# Patient Record
Sex: Female | Born: 1960
Health system: Southern US, Community
[De-identification: ages and names within clinical notes are randomized; demographics above are authoritative.]

## PROBLEM LIST (undated history)

## (undated) DIAGNOSIS — F419 Anxiety disorder, unspecified: Secondary | ICD-10-CM

## (undated) DIAGNOSIS — K3189 Other diseases of stomach and duodenum: Secondary | ICD-10-CM

## (undated) DIAGNOSIS — F329 Major depressive disorder, single episode, unspecified: Secondary | ICD-10-CM

## (undated) DIAGNOSIS — T7840XA Allergy, unspecified, initial encounter: Secondary | ICD-10-CM

## (undated) DIAGNOSIS — R001 Bradycardia, unspecified: Secondary | ICD-10-CM

## (undated) DIAGNOSIS — H9190 Unspecified hearing loss, unspecified ear: Secondary | ICD-10-CM

## (undated) DIAGNOSIS — E785 Hyperlipidemia, unspecified: Secondary | ICD-10-CM

## (undated) DIAGNOSIS — Z860100 Personal history of colon polyps, unspecified: Secondary | ICD-10-CM

## (undated) DIAGNOSIS — A0472 Enterocolitis due to Clostridium difficile, not specified as recurrent: Secondary | ICD-10-CM

## (undated) DIAGNOSIS — F32A Depression, unspecified: Secondary | ICD-10-CM

## (undated) DIAGNOSIS — N61 Mastitis without abscess: Secondary | ICD-10-CM

## (undated) DIAGNOSIS — D689 Coagulation defect, unspecified: Secondary | ICD-10-CM

## (undated) DIAGNOSIS — E538 Deficiency of other specified B group vitamins: Secondary | ICD-10-CM

## (undated) DIAGNOSIS — H269 Unspecified cataract: Secondary | ICD-10-CM

## (undated) DIAGNOSIS — D6800 Von Willebrand disease, unspecified: Secondary | ICD-10-CM

## (undated) DIAGNOSIS — M810 Age-related osteoporosis without current pathological fracture: Secondary | ICD-10-CM

## (undated) DIAGNOSIS — I471 Supraventricular tachycardia, unspecified: Secondary | ICD-10-CM

## (undated) DIAGNOSIS — Z86011 Personal history of benign neoplasm of the brain: Secondary | ICD-10-CM

## (undated) DIAGNOSIS — K648 Other hemorrhoids: Secondary | ICD-10-CM

## (undated) DIAGNOSIS — D691 Qualitative platelet defects: Secondary | ICD-10-CM

## (undated) DIAGNOSIS — K5140 Inflammatory polyps of colon without complications: Secondary | ICD-10-CM

## (undated) DIAGNOSIS — M7710 Lateral epicondylitis, unspecified elbow: Secondary | ICD-10-CM

## (undated) DIAGNOSIS — N189 Chronic kidney disease, unspecified: Secondary | ICD-10-CM

## (undated) DIAGNOSIS — R3915 Urgency of urination: Secondary | ICD-10-CM

## (undated) DIAGNOSIS — E876 Hypokalemia: Secondary | ICD-10-CM

## (undated) DIAGNOSIS — J302 Other seasonal allergic rhinitis: Secondary | ICD-10-CM

## (undated) DIAGNOSIS — M8580 Other specified disorders of bone density and structure, unspecified site: Secondary | ICD-10-CM

## (undated) DIAGNOSIS — D649 Anemia, unspecified: Secondary | ICD-10-CM

## (undated) DIAGNOSIS — I10 Essential (primary) hypertension: Secondary | ICD-10-CM

## (undated) DIAGNOSIS — J189 Pneumonia, unspecified organism: Secondary | ICD-10-CM

## (undated) HISTORY — DX: Von Willebrand disease, unspecified: D68.00

## (undated) HISTORY — PX: WISDOM TOOTH EXTRACTION: SHX21

## (undated) HISTORY — PX: IMPLANTATION BONE ANCHORED HEARING AID: SUR691

## (undated) HISTORY — DX: Lateral epicondylitis, unspecified elbow: M77.10

## (undated) HISTORY — DX: Unspecified cataract: H26.9

## (undated) HISTORY — DX: Personal history of benign neoplasm of the brain: Z86.011

## (undated) HISTORY — DX: Coagulation defect, unspecified: D68.9

## (undated) HISTORY — DX: Anemia, unspecified: D64.9

## (undated) HISTORY — DX: Enterocolitis due to Clostridium difficile, not specified as recurrent: A04.72

## (undated) HISTORY — DX: Von Willebrand disease: D68.0

## (undated) HISTORY — DX: Other diseases of stomach and duodenum: K31.89

## (undated) HISTORY — DX: Allergy, unspecified, initial encounter: T78.40XA

## (undated) HISTORY — DX: Other hemorrhoids: K64.8

## (undated) HISTORY — DX: Inflammatory polyps of colon without complications: K51.40

## (undated) HISTORY — DX: Other specified disorders of bone density and structure, unspecified site: M85.80

## (undated) HISTORY — DX: Supraventricular tachycardia: I47.1

## (undated) HISTORY — PX: ESOPHAGOGASTRODUODENOSCOPY: SHX1529

## (undated) HISTORY — DX: Deficiency of other specified B group vitamins: E53.8

## (undated) HISTORY — DX: Bradycardia, unspecified: R00.1

## (undated) HISTORY — DX: Supraventricular tachycardia, unspecified: I47.10

## (undated) HISTORY — DX: Hyperlipidemia, unspecified: E78.5

## (undated) HISTORY — DX: Essential (primary) hypertension: I10

## (undated) HISTORY — DX: Qualitative platelet defects: D69.1

## (undated) HISTORY — DX: Anxiety disorder, unspecified: F41.9

## (undated) HISTORY — DX: Chronic kidney disease, unspecified: N18.9

## (undated) HISTORY — DX: Age-related osteoporosis without current pathological fracture: M81.0

---

## 1998-06-16 ENCOUNTER — Other Ambulatory Visit: Admission: RE | Admit: 1998-06-16 | Discharge: 1998-06-16 | Payer: Self-pay | Admitting: Obstetrics and Gynecology

## 1998-08-18 ENCOUNTER — Inpatient Hospital Stay (HOSPITAL_COMMUNITY): Admission: AD | Admit: 1998-08-18 | Discharge: 1998-08-19 | Payer: Self-pay | Admitting: Geriatric Medicine

## 1998-08-18 ENCOUNTER — Encounter: Payer: Self-pay | Admitting: Geriatric Medicine

## 1998-08-19 ENCOUNTER — Encounter: Payer: Self-pay | Admitting: Internal Medicine

## 1998-08-19 ENCOUNTER — Encounter: Payer: Self-pay | Admitting: Geriatric Medicine

## 1998-09-03 ENCOUNTER — Ambulatory Visit (HOSPITAL_COMMUNITY): Admission: RE | Admit: 1998-09-03 | Discharge: 1998-09-03 | Payer: Self-pay | Admitting: Internal Medicine

## 1999-01-16 ENCOUNTER — Encounter: Payer: Self-pay | Admitting: Emergency Medicine

## 1999-01-16 ENCOUNTER — Emergency Department (HOSPITAL_COMMUNITY): Admission: EM | Admit: 1999-01-16 | Discharge: 1999-01-16 | Payer: Self-pay | Admitting: Emergency Medicine

## 1999-01-16 ENCOUNTER — Encounter: Payer: Self-pay | Admitting: Internal Medicine

## 2000-01-20 ENCOUNTER — Other Ambulatory Visit: Admission: RE | Admit: 2000-01-20 | Discharge: 2000-01-20 | Payer: Self-pay | Admitting: Obstetrics and Gynecology

## 2000-05-18 ENCOUNTER — Encounter: Payer: Self-pay | Admitting: Obstetrics and Gynecology

## 2000-05-18 ENCOUNTER — Encounter: Admission: RE | Admit: 2000-05-18 | Discharge: 2000-05-18 | Payer: Self-pay | Admitting: Obstetrics and Gynecology

## 2001-05-29 ENCOUNTER — Other Ambulatory Visit: Admission: RE | Admit: 2001-05-29 | Discharge: 2001-05-29 | Payer: Self-pay | Admitting: Obstetrics and Gynecology

## 2001-05-31 ENCOUNTER — Encounter: Admission: RE | Admit: 2001-05-31 | Discharge: 2001-05-31 | Payer: Self-pay | Admitting: Obstetrics and Gynecology

## 2001-05-31 ENCOUNTER — Encounter: Payer: Self-pay | Admitting: Obstetrics and Gynecology

## 2001-07-24 ENCOUNTER — Encounter: Payer: Self-pay | Admitting: Internal Medicine

## 2002-05-26 ENCOUNTER — Encounter: Payer: Self-pay | Admitting: Internal Medicine

## 2002-05-28 ENCOUNTER — Encounter: Payer: Self-pay | Admitting: Gastroenterology

## 2002-05-28 ENCOUNTER — Encounter: Admission: RE | Admit: 2002-05-28 | Discharge: 2002-05-28 | Payer: Self-pay | Admitting: Gastroenterology

## 2002-07-10 ENCOUNTER — Other Ambulatory Visit: Admission: RE | Admit: 2002-07-10 | Discharge: 2002-07-10 | Payer: Self-pay | Admitting: Obstetrics and Gynecology

## 2002-07-18 ENCOUNTER — Encounter: Admission: RE | Admit: 2002-07-18 | Discharge: 2002-07-18 | Payer: Self-pay | Admitting: Internal Medicine

## 2002-07-18 ENCOUNTER — Encounter: Payer: Self-pay | Admitting: Internal Medicine

## 2002-08-29 ENCOUNTER — Encounter (INDEPENDENT_AMBULATORY_CARE_PROVIDER_SITE_OTHER): Payer: Self-pay | Admitting: Specialist

## 2002-08-29 ENCOUNTER — Encounter: Payer: Self-pay | Admitting: Internal Medicine

## 2002-08-29 ENCOUNTER — Ambulatory Visit (HOSPITAL_COMMUNITY): Admission: RE | Admit: 2002-08-29 | Discharge: 2002-08-29 | Payer: Self-pay | Admitting: Gastroenterology

## 2003-07-24 ENCOUNTER — Encounter: Admission: RE | Admit: 2003-07-24 | Discharge: 2003-07-24 | Payer: Self-pay | Admitting: Internal Medicine

## 2003-09-11 ENCOUNTER — Other Ambulatory Visit: Admission: RE | Admit: 2003-09-11 | Discharge: 2003-09-11 | Payer: Self-pay | Admitting: Obstetrics and Gynecology

## 2004-07-27 ENCOUNTER — Encounter: Admission: RE | Admit: 2004-07-27 | Discharge: 2004-07-27 | Payer: Self-pay | Admitting: Internal Medicine

## 2004-11-16 ENCOUNTER — Other Ambulatory Visit: Admission: RE | Admit: 2004-11-16 | Discharge: 2004-11-16 | Payer: Self-pay | Admitting: Obstetrics and Gynecology

## 2005-04-18 ENCOUNTER — Emergency Department (HOSPITAL_COMMUNITY): Admission: EM | Admit: 2005-04-18 | Discharge: 2005-04-18 | Payer: Self-pay | Admitting: Emergency Medicine

## 2005-04-18 ENCOUNTER — Encounter: Payer: Self-pay | Admitting: Internal Medicine

## 2005-05-05 ENCOUNTER — Emergency Department (HOSPITAL_COMMUNITY): Admission: EM | Admit: 2005-05-05 | Discharge: 2005-05-05 | Payer: Self-pay | Admitting: Emergency Medicine

## 2006-08-03 ENCOUNTER — Encounter: Admission: RE | Admit: 2006-08-03 | Discharge: 2006-08-03 | Payer: Self-pay | Admitting: Internal Medicine

## 2006-08-22 ENCOUNTER — Other Ambulatory Visit: Admission: RE | Admit: 2006-08-22 | Discharge: 2006-08-22 | Payer: Self-pay | Admitting: Obstetrics and Gynecology

## 2007-05-10 ENCOUNTER — Encounter: Admission: RE | Admit: 2007-05-10 | Discharge: 2007-05-10 | Payer: Self-pay | Admitting: Neurological Surgery

## 2007-05-29 HISTORY — PX: BRAIN MENINGIOMA EXCISION: SHX576

## 2007-06-05 ENCOUNTER — Ambulatory Visit: Payer: Self-pay | Admitting: Hematology and Oncology

## 2007-06-07 ENCOUNTER — Encounter: Payer: Self-pay | Admitting: Internal Medicine

## 2007-06-07 LAB — CBC WITH DIFFERENTIAL/PLATELET
BASO%: 0.8 % (ref 0.0–2.0)
Basophils Absolute: 0.1 10*3/uL (ref 0.0–0.1)
EOS%: 4.9 % (ref 0.0–7.0)
Eosinophils Absolute: 0.4 10*3/uL (ref 0.0–0.5)
HCT: 40 % (ref 34.8–46.6)
HGB: 14.3 g/dL (ref 11.6–15.9)
LYMPH%: 24.2 % (ref 14.0–48.0)
MCH: 30.7 pg (ref 26.0–34.0)
MCHC: 35.7 g/dL (ref 32.0–36.0)
MCV: 86.1 fL (ref 81.0–101.0)
MONO#: 0.3 10*3/uL (ref 0.1–0.9)
MONO%: 4.7 % (ref 0.0–13.0)
NEUT#: 4.9 10*3/uL (ref 1.5–6.5)
NEUT%: 65.4 % (ref 39.6–76.8)
Platelets: 228 10*3/uL (ref 145–400)
RBC: 4.65 10*6/uL (ref 3.70–5.32)
RDW: 13.2 % (ref 11.3–14.5)
WBC: 7.4 10*3/uL (ref 3.9–10.0)
lymph#: 1.8 10*3/uL (ref 0.9–3.3)

## 2007-06-07 LAB — IVY BLEEDING TIME: Bleeding Time: 11.5 Minutes — ABNORMAL HIGH (ref 2.0–8.0)

## 2007-06-07 LAB — PROTHROMBIN TIME
INR: 1 (ref 0.0–1.5)
Prothrombin Time: 13.1 seconds (ref 11.6–15.2)

## 2007-06-07 LAB — APTT: aPTT: 28 seconds (ref 24–37)

## 2007-06-13 LAB — COMPREHENSIVE METABOLIC PANEL
ALT: 15 U/L (ref 0–35)
AST: 15 U/L (ref 0–37)
Albumin: 4.2 g/dL (ref 3.5–5.2)
Alkaline Phosphatase: 52 U/L (ref 39–117)
BUN: 14 mg/dL (ref 6–23)
CO2: 23 mEq/L (ref 19–32)
Calcium: 9 mg/dL (ref 8.4–10.5)
Chloride: 109 mEq/L (ref 96–112)
Creatinine, Ser: 0.75 mg/dL (ref 0.40–1.20)
Glucose, Bld: 101 mg/dL — ABNORMAL HIGH (ref 70–99)
Potassium: 3.8 mEq/L (ref 3.5–5.3)
Sodium: 141 mEq/L (ref 135–145)
Total Bilirubin: 0.3 mg/dL (ref 0.3–1.2)
Total Protein: 7.3 g/dL (ref 6.0–8.3)

## 2007-06-13 LAB — FIBRINOGEN: Fibrinogen: 346 mg/dL (ref 204–475)

## 2007-06-13 LAB — VON WILLEBRAND FACTOR MULTIMER
Factor-VIII Activity: 69 % — ABNORMAL LOW (ref 75–150)
Ristocetin-Cofactor: 82 % (ref 50–150)
Von Willebrand Ag: 118 % normal (ref 61–164)
Von Willebrand Multimers: NORMAL

## 2007-06-13 LAB — THROMBIN TIME: Thrombin Time: 15.5 s (ref 14.7–19.5)

## 2007-06-17 LAB — IVY BLEEDING TIME: Bleeding Time: 12 Minutes — ABNORMAL HIGH (ref 2.0–8.0)

## 2007-06-21 ENCOUNTER — Encounter: Payer: Self-pay | Admitting: Internal Medicine

## 2007-06-21 LAB — PLATELET AGGREGATION STUDY, BLOOD
ADP10: NORMAL
ADP5: NORMAL
Arachidonic Acid: NORMAL
COMMENT (PLATELET AGGREGATE): NORMAL
Collagen Aggregation: NORMAL
Epinephrine 10: NORMAL
Ristocetin: NORMAL
Thrombin Receptor: NORMAL

## 2007-06-24 LAB — VON WILLEBRAND PANEL
Factor-VIII Activity: 76 % (ref 75–150)
Factor-VIII Activity: 171 % — ABNORMAL HIGH (ref 75–150)
Ristocetin-Cofactor: 58 % (ref 50–150)
Ristocetin-Cofactor: >150 % — ABNORMAL HIGH (ref 50–150)
Von Willebrand Ag: 106 % normal (ref 61–164)
Von Willebrand Ag: 179 % normal — ABNORMAL HIGH (ref 61–164)

## 2007-06-27 ENCOUNTER — Encounter (INDEPENDENT_AMBULATORY_CARE_PROVIDER_SITE_OTHER): Payer: Self-pay | Admitting: Neurological Surgery

## 2007-06-27 ENCOUNTER — Ambulatory Visit: Payer: Self-pay | Admitting: Hematology and Oncology

## 2007-06-27 ENCOUNTER — Inpatient Hospital Stay (HOSPITAL_COMMUNITY): Admission: RE | Admit: 2007-06-27 | Discharge: 2007-07-01 | Payer: Self-pay | Admitting: Neurological Surgery

## 2007-06-27 DIAGNOSIS — H90A21 Sensorineural hearing loss, unilateral, right ear, with restricted hearing on the contralateral side: Secondary | ICD-10-CM | POA: Insufficient documentation

## 2007-08-15 ENCOUNTER — Encounter: Admission: RE | Admit: 2007-08-15 | Discharge: 2007-08-15 | Payer: Self-pay | Admitting: *Deleted

## 2007-10-18 ENCOUNTER — Encounter: Payer: Self-pay | Admitting: Internal Medicine

## 2008-01-22 ENCOUNTER — Encounter: Payer: Self-pay | Admitting: Internal Medicine

## 2008-02-26 ENCOUNTER — Ambulatory Visit: Payer: Self-pay | Admitting: Internal Medicine

## 2008-02-26 DIAGNOSIS — K5190 Ulcerative colitis, unspecified, without complications: Secondary | ICD-10-CM | POA: Insufficient documentation

## 2008-02-26 DIAGNOSIS — F411 Generalized anxiety disorder: Secondary | ICD-10-CM | POA: Insufficient documentation

## 2008-02-26 DIAGNOSIS — R05 Cough: Secondary | ICD-10-CM

## 2008-02-26 DIAGNOSIS — I10 Essential (primary) hypertension: Secondary | ICD-10-CM | POA: Insufficient documentation

## 2008-02-26 DIAGNOSIS — R059 Cough, unspecified: Secondary | ICD-10-CM | POA: Insufficient documentation

## 2008-02-26 DIAGNOSIS — K219 Gastro-esophageal reflux disease without esophagitis: Secondary | ICD-10-CM | POA: Insufficient documentation

## 2008-02-26 DIAGNOSIS — J342 Deviated nasal septum: Secondary | ICD-10-CM | POA: Insufficient documentation

## 2008-02-26 DIAGNOSIS — I498 Other specified cardiac arrhythmias: Secondary | ICD-10-CM | POA: Insufficient documentation

## 2008-02-26 DIAGNOSIS — R498 Other voice and resonance disorders: Secondary | ICD-10-CM | POA: Insufficient documentation

## 2008-02-27 ENCOUNTER — Ambulatory Visit (HOSPITAL_COMMUNITY): Admission: RE | Admit: 2008-02-27 | Discharge: 2008-02-27 | Payer: Self-pay | Admitting: Otolaryngology

## 2008-02-27 DIAGNOSIS — Z9621 Cochlear implant status: Secondary | ICD-10-CM | POA: Insufficient documentation

## 2008-04-21 ENCOUNTER — Encounter: Payer: Self-pay | Admitting: Internal Medicine

## 2008-04-24 ENCOUNTER — Other Ambulatory Visit: Admission: RE | Admit: 2008-04-24 | Discharge: 2008-04-24 | Payer: Self-pay | Admitting: Obstetrics and Gynecology

## 2008-05-06 ENCOUNTER — Ambulatory Visit (HOSPITAL_COMMUNITY): Admission: RE | Admit: 2008-05-06 | Discharge: 2008-05-06 | Payer: Self-pay | Admitting: Internal Medicine

## 2008-05-06 ENCOUNTER — Ambulatory Visit: Payer: Self-pay | Admitting: Internal Medicine

## 2008-05-06 ENCOUNTER — Encounter: Payer: Self-pay | Admitting: Internal Medicine

## 2008-05-10 ENCOUNTER — Encounter: Payer: Self-pay | Admitting: Internal Medicine

## 2008-05-11 ENCOUNTER — Ambulatory Visit: Payer: Self-pay | Admitting: Hematology and Oncology

## 2008-05-11 LAB — BASIC METABOLIC PANEL
BUN: 14 mg/dL (ref 6–23)
CO2: 26 mEq/L (ref 19–32)
Calcium: 9.7 mg/dL (ref 8.4–10.5)
Chloride: 101 mEq/L (ref 96–112)
Creatinine, Ser: 0.64 mg/dL (ref 0.40–1.20)
Glucose, Bld: 85 mg/dL (ref 70–99)
Potassium: 3.5 mEq/L (ref 3.5–5.3)
Sodium: 139 mEq/L (ref 135–145)

## 2008-05-22 ENCOUNTER — Ambulatory Visit (HOSPITAL_COMMUNITY): Admission: RE | Admit: 2008-05-22 | Discharge: 2008-05-22 | Payer: Self-pay | Admitting: Internal Medicine

## 2008-05-23 ENCOUNTER — Encounter: Payer: Self-pay | Admitting: Internal Medicine

## 2008-05-26 ENCOUNTER — Encounter: Payer: Self-pay | Admitting: Internal Medicine

## 2008-05-29 ENCOUNTER — Encounter: Payer: Self-pay | Admitting: Internal Medicine

## 2008-06-11 ENCOUNTER — Encounter: Payer: Self-pay | Admitting: Internal Medicine

## 2008-06-24 ENCOUNTER — Ambulatory Visit: Payer: Self-pay | Admitting: Gastroenterology

## 2008-06-24 ENCOUNTER — Telehealth: Payer: Self-pay | Admitting: Internal Medicine

## 2008-06-24 ENCOUNTER — Encounter: Payer: Self-pay | Admitting: Gastroenterology

## 2008-06-26 ENCOUNTER — Telehealth: Payer: Self-pay | Admitting: Gastroenterology

## 2008-07-10 ENCOUNTER — Ambulatory Visit: Payer: Self-pay | Admitting: Internal Medicine

## 2008-07-30 ENCOUNTER — Encounter: Payer: Self-pay | Admitting: Internal Medicine

## 2008-09-21 ENCOUNTER — Telehealth: Payer: Self-pay | Admitting: Internal Medicine

## 2008-09-29 ENCOUNTER — Encounter: Payer: Self-pay | Admitting: Internal Medicine

## 2008-10-15 ENCOUNTER — Telehealth: Payer: Self-pay | Admitting: Internal Medicine

## 2008-10-22 ENCOUNTER — Ambulatory Visit: Payer: Self-pay | Admitting: Internal Medicine

## 2008-10-23 ENCOUNTER — Ambulatory Visit: Payer: Self-pay | Admitting: Internal Medicine

## 2008-10-23 LAB — CONVERTED CEMR LAB
ALT: 17 units/L (ref 0–35)
AST: 17 units/L (ref 0–37)
Albumin: 4 g/dL (ref 3.5–5.2)
Alkaline Phosphatase: 65 units/L (ref 39–117)
BUN: 12 mg/dL (ref 6–23)
Basophils Absolute: 0 10*3/uL (ref 0.0–0.1)
Basophils Relative: 0.5 % (ref 0.0–3.0)
CO2: 31 meq/L (ref 19–32)
Calcium: 9.4 mg/dL (ref 8.4–10.5)
Chloride: 100 meq/L (ref 96–112)
Creatinine, Ser: 0.7 mg/dL (ref 0.4–1.2)
Eosinophils Absolute: 0 10*3/uL (ref 0.0–0.7)
Eosinophils Relative: 0 % (ref 0.0–5.0)
GFR calc Af Amer: 115 mL/min
GFR calc non Af Amer: 95 mL/min
Glucose, Bld: 126 mg/dL — ABNORMAL HIGH (ref 70–99)
HCT: 42.5 % (ref 36.0–46.0)
Hemoglobin: 15 g/dL (ref 12.0–15.0)
Hgb A1c MFr Bld: 5.4 % (ref 4.6–6.0)
Iron: 56 ug/dL (ref 42–145)
Lymphocytes Relative: 6.5 % — ABNORMAL LOW (ref 12.0–46.0)
MCHC: 35.2 g/dL (ref 30.0–36.0)
MCV: 87.6 fL (ref 78.0–100.0)
Monocytes Absolute: 0.1 10*3/uL (ref 0.1–1.0)
Monocytes Relative: 0.7 % — ABNORMAL LOW (ref 3.0–12.0)
Neutro Abs: 8.7 10*3/uL — ABNORMAL HIGH (ref 1.4–7.7)
Neutrophils Relative %: 92.3 % — ABNORMAL HIGH (ref 43.0–77.0)
Platelets: 255 10*3/uL (ref 150–400)
Potassium: 3.6 meq/L (ref 3.5–5.1)
RBC: 4.86 M/uL (ref 3.87–5.11)
RDW: 12.8 % (ref 11.5–14.6)
Saturation Ratios: 18.1 % — ABNORMAL LOW (ref 20.0–50.0)
Sed Rate: 12 mm/hr (ref 0–22)
Sodium: 140 meq/L (ref 135–145)
Total Bilirubin: 0.6 mg/dL (ref 0.3–1.2)
Total Protein: 7.2 g/dL (ref 6.0–8.3)
Transferrin: 221.3 mg/dL (ref 212.0–360.0)
WBC: 9.4 10*3/uL (ref 4.5–10.5)

## 2008-10-28 ENCOUNTER — Telehealth: Payer: Self-pay | Admitting: Internal Medicine

## 2008-12-18 ENCOUNTER — Ambulatory Visit: Payer: Self-pay | Admitting: Internal Medicine

## 2009-03-04 ENCOUNTER — Ambulatory Visit: Payer: Self-pay | Admitting: Internal Medicine

## 2009-03-04 LAB — CONVERTED CEMR LAB
Basophils Absolute: 0 10*3/uL (ref 0.0–0.1)
Basophils Relative: 0.5 % (ref 0.0–3.0)
Eosinophils Absolute: 0 10*3/uL (ref 0.0–0.7)
Eosinophils Relative: 0.4 % (ref 0.0–5.0)
HCT: 42 % (ref 36.0–46.0)
Hemoglobin: 14.6 g/dL (ref 12.0–15.0)
Lymphocytes Relative: 31.9 % (ref 12.0–46.0)
Lymphs Abs: 1.8 10*3/uL (ref 0.7–4.0)
MCHC: 34.7 g/dL (ref 30.0–36.0)
MCV: 87.6 fL (ref 78.0–100.0)
Monocytes Absolute: 0.5 10*3/uL (ref 0.1–1.0)
Monocytes Relative: 8.2 % (ref 3.0–12.0)
Neutro Abs: 3.2 10*3/uL (ref 1.4–7.7)
Neutrophils Relative %: 59 % (ref 43.0–77.0)
Platelets: 194 10*3/uL (ref 150.0–400.0)
RBC: 4.79 M/uL (ref 3.87–5.11)
RDW: 13.2 % (ref 11.5–14.6)
WBC: 5.5 10*3/uL (ref 4.5–10.5)

## 2009-04-30 ENCOUNTER — Telehealth: Payer: Self-pay | Admitting: Internal Medicine

## 2009-05-18 ENCOUNTER — Ambulatory Visit: Payer: Self-pay | Admitting: Gastroenterology

## 2009-05-18 DIAGNOSIS — K644 Residual hemorrhoidal skin tags: Secondary | ICD-10-CM | POA: Insufficient documentation

## 2009-05-19 ENCOUNTER — Encounter: Payer: Self-pay | Admitting: Nurse Practitioner

## 2009-05-19 LAB — CONVERTED CEMR LAB
ALT: 24 units/L (ref 0–35)
AST: 26 units/L (ref 0–37)
Albumin: 4 g/dL (ref 3.5–5.2)
Alkaline Phosphatase: 74 units/L (ref 39–117)
BUN: 11 mg/dL (ref 6–23)
Basophils Absolute: 0 10*3/uL (ref 0.0–0.1)
Basophils Relative: 0.7 % (ref 0.0–3.0)
CO2: 32 meq/L (ref 19–32)
CRP, High Sensitivity: 4.2 (ref 0.00–5.00)
Calcium: 9.2 mg/dL (ref 8.4–10.5)
Chloride: 102 meq/L (ref 96–112)
Creatinine, Ser: 0.7 mg/dL (ref 0.4–1.2)
Eosinophils Absolute: 0.1 10*3/uL (ref 0.0–0.7)
Eosinophils Relative: 1.6 % (ref 0.0–5.0)
GFR calc non Af Amer: 94.84 mL/min (ref >60)
Glucose, Bld: 89 mg/dL (ref 70–99)
HCT: 41 % (ref 36.0–46.0)
Hemoglobin: 13.9 g/dL (ref 12.0–15.0)
Lymphocytes Relative: 32.1 % (ref 12.0–46.0)
Lymphs Abs: 1.7 10*3/uL (ref 0.7–4.0)
MCHC: 34 g/dL (ref 30.0–36.0)
MCV: 86.9 fL (ref 78.0–100.0)
Monocytes Absolute: 0.5 10*3/uL (ref 0.1–1.0)
Monocytes Relative: 9.6 % (ref 3.0–12.0)
Neutro Abs: 2.9 10*3/uL (ref 1.4–7.7)
Neutrophils Relative %: 56 % (ref 43.0–77.0)
Platelets: 210 10*3/uL (ref 150.0–400.0)
Potassium: 3.5 meq/L (ref 3.5–5.1)
RBC: 4.72 M/uL (ref 3.87–5.11)
RDW: 13.3 % (ref 11.5–14.6)
Sed Rate: 13 mm/hr (ref 0–22)
Sodium: 140 meq/L (ref 135–145)
Total Bilirubin: 0.8 mg/dL (ref 0.3–1.2)
Total Protein: 7.4 g/dL (ref 6.0–8.3)
WBC: 5.2 10*3/uL (ref 4.5–10.5)

## 2009-05-21 ENCOUNTER — Encounter: Payer: Self-pay | Admitting: Nurse Practitioner

## 2009-06-02 ENCOUNTER — Ambulatory Visit: Payer: Self-pay | Admitting: Internal Medicine

## 2009-06-19 ENCOUNTER — Emergency Department (HOSPITAL_COMMUNITY): Admission: EM | Admit: 2009-06-19 | Discharge: 2009-06-19 | Payer: Self-pay | Admitting: Emergency Medicine

## 2009-06-19 ENCOUNTER — Encounter: Payer: Self-pay | Admitting: Internal Medicine

## 2009-07-08 ENCOUNTER — Telehealth: Payer: Self-pay | Admitting: Internal Medicine

## 2009-07-09 ENCOUNTER — Emergency Department (HOSPITAL_COMMUNITY): Admission: EM | Admit: 2009-07-09 | Discharge: 2009-07-09 | Payer: Self-pay | Admitting: Emergency Medicine

## 2009-07-09 ENCOUNTER — Encounter: Payer: Self-pay | Admitting: Internal Medicine

## 2009-07-19 ENCOUNTER — Ambulatory Visit: Payer: Self-pay | Admitting: Internal Medicine

## 2009-07-19 ENCOUNTER — Encounter: Payer: Self-pay | Admitting: Internal Medicine

## 2009-07-19 LAB — CONVERTED CEMR LAB
ALT: 13 units/L (ref 0–35)
AST: 21 units/L (ref 0–37)
Albumin: 3.6 g/dL (ref 3.5–5.2)
Alkaline Phosphatase: 43 units/L (ref 39–117)
BUN: 8 mg/dL (ref 6–23)
Basophils Absolute: 0 10*3/uL (ref 0.0–0.1)
Basophils Relative: 0.2 % (ref 0.0–3.0)
CO2: 31 meq/L (ref 19–32)
Calcium: 9 mg/dL (ref 8.4–10.5)
Chloride: 104 meq/L (ref 96–112)
Creatinine, Ser: 0.8 mg/dL (ref 0.4–1.2)
Eosinophils Absolute: 0 10*3/uL (ref 0.0–0.7)
Eosinophils Relative: 0.1 % (ref 0.0–5.0)
GFR calc non Af Amer: 81.23 mL/min (ref >60)
Glucose, Bld: 147 mg/dL — ABNORMAL HIGH (ref 70–99)
HCT: 39.8 % (ref 36.0–46.0)
Hemoglobin: 13.4 g/dL (ref 12.0–15.0)
Lymphocytes Relative: 9.8 % — ABNORMAL LOW (ref 12.0–46.0)
Lymphs Abs: 0.8 10*3/uL (ref 0.7–4.0)
MCHC: 33.7 g/dL (ref 30.0–36.0)
MCV: 90.8 fL (ref 78.0–100.0)
Monocytes Absolute: 0.4 10*3/uL (ref 0.1–1.0)
Monocytes Relative: 4.8 % (ref 3.0–12.0)
Neutro Abs: 7.2 10*3/uL (ref 1.4–7.7)
Neutrophils Relative %: 85.1 % — ABNORMAL HIGH (ref 43.0–77.0)
Platelets: 245 10*3/uL (ref 150.0–400.0)
Potassium: 3.8 meq/L (ref 3.5–5.1)
RBC: 4.38 M/uL (ref 3.87–5.11)
RDW: 13.1 % (ref 11.5–14.6)
Sed Rate: 9 mm/hr (ref 0–22)
Sodium: 142 meq/L (ref 135–145)
Total Bilirubin: 0.8 mg/dL (ref 0.3–1.2)
Total Protein: 6.4 g/dL (ref 6.0–8.3)
WBC: 8.4 10*3/uL (ref 4.5–10.5)

## 2009-07-23 ENCOUNTER — Telehealth: Payer: Self-pay | Admitting: Internal Medicine

## 2009-07-26 ENCOUNTER — Ambulatory Visit: Payer: Self-pay | Admitting: Internal Medicine

## 2009-07-26 ENCOUNTER — Encounter: Payer: Self-pay | Admitting: Internal Medicine

## 2009-07-26 LAB — CONVERTED CEMR LAB
Glucose, Bld: 100 mg/dL — ABNORMAL HIGH (ref 70–99)
Potassium: 3.6 meq/L (ref 3.5–5.1)

## 2009-07-29 ENCOUNTER — Encounter: Payer: Self-pay | Admitting: Internal Medicine

## 2009-07-30 ENCOUNTER — Ambulatory Visit: Payer: Self-pay | Admitting: Internal Medicine

## 2009-07-30 LAB — CONVERTED CEMR LAB
CO2: 31 meq/L (ref 19–32)
Chloride: 102 meq/L (ref 96–112)
Potassium: 4.2 meq/L (ref 3.5–5.1)
Sodium: 140 meq/L (ref 135–145)

## 2009-08-09 ENCOUNTER — Encounter: Payer: Self-pay | Admitting: Internal Medicine

## 2009-08-13 ENCOUNTER — Ambulatory Visit: Payer: Self-pay | Admitting: Internal Medicine

## 2009-08-16 LAB — CONVERTED CEMR LAB
CO2: 30 meq/L (ref 19–32)
Chloride: 99 meq/L (ref 96–112)
Potassium: 4.2 meq/L (ref 3.5–5.1)
Sodium: 136 meq/L (ref 135–145)

## 2009-09-03 ENCOUNTER — Ambulatory Visit: Payer: Self-pay | Admitting: Internal Medicine

## 2009-09-06 LAB — CONVERTED CEMR LAB
Albumin: 3.3 g/dL — ABNORMAL LOW (ref 3.5–5.2)
BUN: 14 mg/dL (ref 6–23)
CO2: 31 meq/L (ref 19–32)
Calcium: 9 mg/dL (ref 8.4–10.5)
Chloride: 102 meq/L (ref 96–112)
Creatinine, Ser: 1 mg/dL (ref 0.4–1.2)
GFR calc non Af Amer: 62.76 mL/min (ref >60)
Glucose, Bld: 118 mg/dL — ABNORMAL HIGH (ref 70–99)
Hgb A1c MFr Bld: 5.7 % (ref 4.6–6.5)
Phosphorus: 3.8 mg/dL (ref 2.3–4.6)
Potassium: 4 meq/L (ref 3.5–5.1)
Sodium: 142 meq/L (ref 135–145)

## 2009-09-17 ENCOUNTER — Ambulatory Visit: Payer: Self-pay | Admitting: Internal Medicine

## 2009-09-17 DIAGNOSIS — F329 Major depressive disorder, single episode, unspecified: Secondary | ICD-10-CM | POA: Insufficient documentation

## 2009-09-19 ENCOUNTER — Emergency Department (HOSPITAL_COMMUNITY): Admission: EM | Admit: 2009-09-19 | Discharge: 2009-09-19 | Payer: Self-pay | Admitting: Emergency Medicine

## 2009-09-19 ENCOUNTER — Encounter (INDEPENDENT_AMBULATORY_CARE_PROVIDER_SITE_OTHER): Payer: Self-pay | Admitting: *Deleted

## 2009-09-23 ENCOUNTER — Telehealth: Payer: Self-pay | Admitting: Internal Medicine

## 2009-09-24 ENCOUNTER — Telehealth: Payer: Self-pay | Admitting: Internal Medicine

## 2009-09-28 ENCOUNTER — Telehealth: Payer: Self-pay | Admitting: Internal Medicine

## 2009-10-01 ENCOUNTER — Telehealth: Payer: Self-pay | Admitting: Internal Medicine

## 2009-10-01 ENCOUNTER — Encounter: Payer: Self-pay | Admitting: Internal Medicine

## 2009-10-08 ENCOUNTER — Ambulatory Visit: Payer: Self-pay | Admitting: Internal Medicine

## 2009-10-08 LAB — CONVERTED CEMR LAB
Basophils Absolute: 0.3 10*3/uL — ABNORMAL HIGH (ref 0.0–0.1)
Basophils Relative: 3.3 % — ABNORMAL HIGH (ref 0.0–3.0)
Eosinophils Absolute: 0 10*3/uL (ref 0.0–0.7)
Eosinophils Relative: 0 % (ref 0.0–5.0)
HCT: 41.6 % (ref 36.0–46.0)
Hemoglobin: 13.3 g/dL (ref 12.0–15.0)
Lymphocytes Relative: 6 % — ABNORMAL LOW (ref 12.0–46.0)
Lymphs Abs: 0.6 10*3/uL — ABNORMAL LOW (ref 0.7–4.0)
MCHC: 32 g/dL (ref 30.0–36.0)
MCV: 86.9 fL (ref 78.0–100.0)
Monocytes Absolute: 0.1 10*3/uL (ref 0.1–1.0)
Monocytes Relative: 0.7 % — ABNORMAL LOW (ref 3.0–12.0)
Neutro Abs: 9.2 10*3/uL — ABNORMAL HIGH (ref 1.4–7.7)
Neutrophils Relative %: 90 % — ABNORMAL HIGH (ref 43.0–77.0)
Platelets: 260 10*3/uL (ref 150.0–400.0)
Potassium: 4 meq/L (ref 3.5–5.1)
RBC: 4.79 M/uL (ref 3.87–5.11)
RDW: 13.6 % (ref 11.5–14.6)
WBC: 10.2 10*3/uL (ref 4.5–10.5)

## 2009-10-13 ENCOUNTER — Telehealth: Payer: Self-pay | Admitting: Internal Medicine

## 2009-10-15 ENCOUNTER — Encounter: Payer: Self-pay | Admitting: Internal Medicine

## 2009-10-15 ENCOUNTER — Ambulatory Visit: Payer: Self-pay | Admitting: Internal Medicine

## 2009-10-15 DIAGNOSIS — I491 Atrial premature depolarization: Secondary | ICD-10-CM | POA: Insufficient documentation

## 2009-10-15 DIAGNOSIS — I495 Sick sinus syndrome: Secondary | ICD-10-CM | POA: Insufficient documentation

## 2009-10-15 LAB — CONVERTED CEMR LAB
BUN: 9 mg/dL (ref 6–23)
Basophils Absolute: 0.1 10*3/uL (ref 0.0–0.1)
Basophils Relative: 0.7 % (ref 0.0–3.0)
CO2: 30 meq/L (ref 19–32)
Calcium: 9.5 mg/dL (ref 8.4–10.5)
Chloride: 103 meq/L (ref 96–112)
Creatinine, Ser: 0.8 mg/dL (ref 0.4–1.2)
Eosinophils Absolute: 0 10*3/uL (ref 0.0–0.7)
Eosinophils Relative: 0.5 % (ref 0.0–5.0)
GFR calc non Af Amer: 81.15 mL/min (ref >60)
Glucose, Bld: 112 mg/dL — ABNORMAL HIGH (ref 70–99)
HCT: 41 % (ref 36.0–46.0)
Hemoglobin: 13.4 g/dL (ref 12.0–15.0)
INR: 1 (ref 0.8–1.0)
Lymphocytes Relative: 8.8 % — ABNORMAL LOW (ref 12.0–46.0)
Lymphs Abs: 0.8 10*3/uL (ref 0.7–4.0)
MCHC: 32.6 g/dL (ref 30.0–36.0)
MCV: 86.6 fL (ref 78.0–100.0)
Monocytes Absolute: 0.2 10*3/uL (ref 0.1–1.0)
Monocytes Relative: 2 % — ABNORMAL LOW (ref 3.0–12.0)
Neutro Abs: 7.9 10*3/uL — ABNORMAL HIGH (ref 1.4–7.7)
Neutrophils Relative %: 88 % — ABNORMAL HIGH (ref 43.0–77.0)
Platelets: 238 10*3/uL (ref 150.0–400.0)
Potassium: 3.9 meq/L (ref 3.5–5.1)
Prothrombin Time: 10.2 s (ref 9.1–11.7)
RBC: 4.74 M/uL (ref 3.87–5.11)
RDW: 14 % (ref 11.5–14.6)
Sodium: 140 meq/L (ref 135–145)
WBC: 9 10*3/uL (ref 4.5–10.5)
aPTT: 25.6 s (ref 21.7–28.8)

## 2009-10-18 ENCOUNTER — Encounter: Payer: Self-pay | Admitting: Internal Medicine

## 2009-10-20 ENCOUNTER — Telehealth: Payer: Self-pay | Admitting: Internal Medicine

## 2009-10-22 ENCOUNTER — Ambulatory Visit (HOSPITAL_COMMUNITY): Admission: RE | Admit: 2009-10-22 | Discharge: 2009-10-23 | Payer: Self-pay | Admitting: Internal Medicine

## 2009-10-22 ENCOUNTER — Ambulatory Visit: Payer: Self-pay | Admitting: Internal Medicine

## 2009-10-22 HISTORY — PX: CARDIAC ELECTROPHYSIOLOGY STUDY AND ABLATION: SHX1294

## 2009-10-26 ENCOUNTER — Telehealth: Payer: Self-pay | Admitting: Internal Medicine

## 2009-10-26 ENCOUNTER — Ambulatory Visit: Payer: Self-pay | Admitting: Hematology and Oncology

## 2009-10-26 LAB — BASIC METABOLIC PANEL
BUN: 13 mg/dL (ref 6–23)
CO2: 28 mEq/L (ref 19–32)
Calcium: 9.2 mg/dL (ref 8.4–10.5)
Chloride: 100 mEq/L (ref 96–112)
Creatinine, Ser: 0.67 mg/dL (ref 0.40–1.20)
Glucose, Bld: 123 mg/dL — ABNORMAL HIGH (ref 70–99)
Potassium: 3.8 mEq/L (ref 3.5–5.3)
Sodium: 135 mEq/L (ref 135–145)

## 2009-11-01 ENCOUNTER — Encounter (INDEPENDENT_AMBULATORY_CARE_PROVIDER_SITE_OTHER): Payer: Self-pay | Admitting: *Deleted

## 2009-11-01 ENCOUNTER — Ambulatory Visit: Payer: Self-pay | Admitting: Internal Medicine

## 2009-11-01 ENCOUNTER — Telehealth: Payer: Self-pay | Admitting: Internal Medicine

## 2009-11-01 DIAGNOSIS — T148XXA Other injury of unspecified body region, initial encounter: Secondary | ICD-10-CM | POA: Insufficient documentation

## 2009-11-02 ENCOUNTER — Ambulatory Visit: Payer: Self-pay

## 2009-11-02 ENCOUNTER — Encounter: Payer: Self-pay | Admitting: Internal Medicine

## 2009-11-04 ENCOUNTER — Telehealth (INDEPENDENT_AMBULATORY_CARE_PROVIDER_SITE_OTHER): Payer: Self-pay | Admitting: *Deleted

## 2009-11-05 ENCOUNTER — Telehealth: Payer: Self-pay | Admitting: Internal Medicine

## 2009-11-09 ENCOUNTER — Encounter: Payer: Self-pay | Admitting: Cardiology

## 2009-11-10 ENCOUNTER — Telehealth: Payer: Self-pay | Admitting: Internal Medicine

## 2009-11-10 ENCOUNTER — Encounter: Payer: Self-pay | Admitting: Physician Assistant

## 2009-11-10 ENCOUNTER — Ambulatory Visit: Payer: Self-pay | Admitting: Internal Medicine

## 2009-11-19 ENCOUNTER — Ambulatory Visit: Payer: Self-pay | Admitting: Internal Medicine

## 2009-11-19 LAB — CONVERTED CEMR LAB
ALT: 14 units/L (ref 0–35)
AST: 17 units/L (ref 0–37)
Albumin: 3.6 g/dL (ref 3.5–5.2)
Alkaline Phosphatase: 39 units/L (ref 39–117)
BUN: 14 mg/dL (ref 6–23)
Basophils Absolute: 0 10*3/uL (ref 0.0–0.1)
Basophils Relative: 0.2 % (ref 0.0–3.0)
CO2: 32 meq/L (ref 19–32)
Calcium: 9.1 mg/dL (ref 8.4–10.5)
Chloride: 106 meq/L (ref 96–112)
Creatinine, Ser: 0.7 mg/dL (ref 0.4–1.2)
Eosinophils Absolute: 0 10*3/uL (ref 0.0–0.7)
Eosinophils Relative: 0.2 % (ref 0.0–5.0)
GFR calc non Af Amer: 94.64 mL/min (ref >60)
Glucose, Bld: 113 mg/dL — ABNORMAL HIGH (ref 70–99)
HCT: 37.6 % (ref 36.0–46.0)
Hemoglobin: 12.4 g/dL (ref 12.0–15.0)
Lymphocytes Relative: 7.9 % — ABNORMAL LOW (ref 12.0–46.0)
Lymphs Abs: 0.6 10*3/uL — ABNORMAL LOW (ref 0.7–4.0)
MCHC: 33.1 g/dL (ref 30.0–36.0)
MCV: 85 fL (ref 78.0–100.0)
Monocytes Absolute: 0.2 10*3/uL (ref 0.1–1.0)
Monocytes Relative: 2.5 % — ABNORMAL LOW (ref 3.0–12.0)
Neutro Abs: 6.2 10*3/uL (ref 1.4–7.7)
Neutrophils Relative %: 89.2 % — ABNORMAL HIGH (ref 43.0–77.0)
Platelets: 258 10*3/uL (ref 150.0–400.0)
Potassium: 3.8 meq/L (ref 3.5–5.1)
RBC: 4.43 M/uL (ref 3.87–5.11)
RDW: 15.5 % — ABNORMAL HIGH (ref 11.5–14.6)
Sed Rate: 12 mm/hr (ref 0–22)
Sodium: 144 meq/L (ref 135–145)
Total Bilirubin: 0.6 mg/dL (ref 0.3–1.2)
Total Protein: 6.6 g/dL (ref 6.0–8.3)
WBC: 7 10*3/uL (ref 4.5–10.5)

## 2009-11-22 ENCOUNTER — Encounter: Payer: Self-pay | Admitting: Internal Medicine

## 2009-11-25 ENCOUNTER — Telehealth (INDEPENDENT_AMBULATORY_CARE_PROVIDER_SITE_OTHER): Payer: Self-pay | Admitting: *Deleted

## 2009-12-03 ENCOUNTER — Ambulatory Visit: Payer: Self-pay | Admitting: Internal Medicine

## 2009-12-23 ENCOUNTER — Telehealth: Payer: Self-pay | Admitting: Internal Medicine

## 2009-12-27 ENCOUNTER — Encounter: Payer: Self-pay | Admitting: Internal Medicine

## 2010-01-03 ENCOUNTER — Ambulatory Visit: Payer: Self-pay | Admitting: Internal Medicine

## 2010-01-04 ENCOUNTER — Encounter: Payer: Self-pay | Admitting: Internal Medicine

## 2010-01-04 LAB — CONVERTED CEMR LAB: Potassium: 3.9 meq/L (ref 3.5–5.1)

## 2010-01-06 ENCOUNTER — Telehealth: Payer: Self-pay | Admitting: Internal Medicine

## 2010-01-07 ENCOUNTER — Ambulatory Visit: Payer: Self-pay | Admitting: Internal Medicine

## 2010-01-17 ENCOUNTER — Telehealth: Payer: Self-pay | Admitting: Internal Medicine

## 2010-01-21 ENCOUNTER — Encounter (HOSPITAL_COMMUNITY): Admission: RE | Admit: 2010-01-21 | Discharge: 2010-04-21 | Payer: Self-pay | Admitting: Internal Medicine

## 2010-01-25 ENCOUNTER — Encounter: Payer: Self-pay | Admitting: Internal Medicine

## 2010-01-27 ENCOUNTER — Ambulatory Visit: Payer: Self-pay | Admitting: Hematology and Oncology

## 2010-01-28 ENCOUNTER — Encounter: Payer: Self-pay | Admitting: Internal Medicine

## 2010-01-28 LAB — CBC WITH DIFFERENTIAL/PLATELET
BASO%: 0.2 % (ref 0.0–2.0)
Basophils Absolute: 0 10*3/uL (ref 0.0–0.1)
EOS%: 0.4 % (ref 0.0–7.0)
Eosinophils Absolute: 0 10*3/uL (ref 0.0–0.5)
HCT: 39 % (ref 34.8–46.6)
HGB: 13 g/dL (ref 11.6–15.9)
LYMPH%: 10.5 % — ABNORMAL LOW (ref 14.0–49.7)
MCH: 28.3 pg (ref 25.1–34.0)
MCHC: 33.2 g/dL (ref 31.5–36.0)
MCV: 85.1 fL (ref 79.5–101.0)
MONO#: 0.2 10*3/uL (ref 0.1–0.9)
MONO%: 2.4 % (ref 0.0–14.0)
NEUT#: 6.9 10*3/uL — ABNORMAL HIGH (ref 1.5–6.5)
NEUT%: 86.5 % — ABNORMAL HIGH (ref 38.4–76.8)
Platelets: 290 10*3/uL (ref 145–400)
RBC: 4.59 10*6/uL (ref 3.70–5.45)
RDW: 18.4 % — ABNORMAL HIGH (ref 11.2–14.5)
WBC: 8 10*3/uL (ref 3.9–10.3)
lymph#: 0.8 10*3/uL — ABNORMAL LOW (ref 0.9–3.3)

## 2010-01-28 LAB — BASIC METABOLIC PANEL
BUN: 15 mg/dL (ref 6–23)
CO2: 29 mEq/L (ref 19–32)
Calcium: 9.6 mg/dL (ref 8.4–10.5)
Chloride: 103 mEq/L (ref 96–112)
Creatinine, Ser: 0.88 mg/dL (ref 0.40–1.20)
Glucose, Bld: 99 mg/dL (ref 70–99)
Potassium: 4.5 mEq/L (ref 3.5–5.3)
Sodium: 142 mEq/L (ref 135–145)

## 2010-01-31 ENCOUNTER — Ambulatory Visit: Payer: Self-pay | Admitting: Internal Medicine

## 2010-02-01 ENCOUNTER — Telehealth: Payer: Self-pay | Admitting: Internal Medicine

## 2010-02-04 ENCOUNTER — Encounter: Payer: Self-pay | Admitting: Internal Medicine

## 2010-02-11 ENCOUNTER — Ambulatory Visit: Payer: Self-pay | Admitting: Internal Medicine

## 2010-02-14 LAB — CONVERTED CEMR LAB
Basophils Absolute: 0 10*3/uL (ref 0.0–0.1)
Basophils Relative: 0.2 % (ref 0.0–3.0)
Eosinophils Absolute: 0 10*3/uL (ref 0.0–0.7)
Eosinophils Relative: 0 % (ref 0.0–5.0)
HCT: 36.3 % (ref 36.0–46.0)
Hemoglobin: 12.3 g/dL (ref 12.0–15.0)
Lymphocytes Relative: 9.7 % — ABNORMAL LOW (ref 12.0–46.0)
Lymphs Abs: 0.6 10*3/uL — ABNORMAL LOW (ref 0.7–4.0)
MCHC: 34 g/dL (ref 30.0–36.0)
MCV: 84.5 fL (ref 78.0–100.0)
Monocytes Absolute: 0.2 10*3/uL (ref 0.1–1.0)
Monocytes Relative: 3.1 % (ref 3.0–12.0)
Neutro Abs: 5.3 10*3/uL (ref 1.4–7.7)
Neutrophils Relative %: 87 % — ABNORMAL HIGH (ref 43.0–77.0)
Platelets: 260 10*3/uL (ref 150.0–400.0)
Potassium: 4.2 meq/L (ref 3.5–5.1)
RBC: 4.29 M/uL (ref 3.87–5.11)
RDW: 19 % — ABNORMAL HIGH (ref 11.5–14.6)
WBC: 6.1 10*3/uL (ref 4.5–10.5)

## 2010-02-18 ENCOUNTER — Ambulatory Visit: Payer: Self-pay | Admitting: Internal Medicine

## 2010-02-21 LAB — CONVERTED CEMR LAB
Basophils Absolute: 0 10*3/uL (ref 0.0–0.1)
Basophils Relative: 0 % (ref 0.0–3.0)
Eosinophils Absolute: 0 10*3/uL (ref 0.0–0.7)
Eosinophils Relative: 0.1 % (ref 0.0–5.0)
HCT: 35.8 % — ABNORMAL LOW (ref 36.0–46.0)
Hemoglobin: 12.4 g/dL (ref 12.0–15.0)
Lymphocytes Relative: 7.4 % — ABNORMAL LOW (ref 12.0–46.0)
Lymphs Abs: 0.5 10*3/uL — ABNORMAL LOW (ref 0.7–4.0)
MCHC: 34.5 g/dL (ref 30.0–36.0)
MCV: 86.1 fL (ref 78.0–100.0)
Monocytes Absolute: 0.1 10*3/uL (ref 0.1–1.0)
Monocytes Relative: 1.4 % — ABNORMAL LOW (ref 3.0–12.0)
Neutro Abs: 5.9 10*3/uL (ref 1.4–7.7)
Neutrophils Relative %: 91.1 % — ABNORMAL HIGH (ref 43.0–77.0)
Platelets: 306 10*3/uL (ref 150.0–400.0)
RBC: 4.16 M/uL (ref 3.87–5.11)
RDW: 19.4 % — ABNORMAL HIGH (ref 11.5–14.6)
WBC: 6.5 10*3/uL (ref 4.5–10.5)

## 2010-02-25 ENCOUNTER — Ambulatory Visit: Payer: Self-pay | Admitting: Internal Medicine

## 2010-03-01 LAB — CONVERTED CEMR LAB
Basophils Absolute: 0 10*3/uL (ref 0.0–0.1)
Basophils Relative: 0.4 % (ref 0.0–3.0)
Eosinophils Absolute: 0 10*3/uL (ref 0.0–0.7)
Eosinophils Relative: 0.7 % (ref 0.0–5.0)
HCT: 37.8 % (ref 36.0–46.0)
Hemoglobin: 12.9 g/dL (ref 12.0–15.0)
Lymphocytes Relative: 13.9 % (ref 12.0–46.0)
Lymphs Abs: 0.9 10*3/uL (ref 0.7–4.0)
MCHC: 34.3 g/dL (ref 30.0–36.0)
MCV: 84.2 fL (ref 78.0–100.0)
Monocytes Absolute: 0.4 10*3/uL (ref 0.1–1.0)
Monocytes Relative: 6.1 % (ref 3.0–12.0)
Neutro Abs: 5.3 10*3/uL (ref 1.4–7.7)
Neutrophils Relative %: 78.9 % — ABNORMAL HIGH (ref 43.0–77.0)
Platelets: 246 10*3/uL (ref 150.0–400.0)
Potassium: 4.1 meq/L (ref 3.5–5.1)
RBC: 4.49 M/uL (ref 3.87–5.11)
RDW: 18.9 % — ABNORMAL HIGH (ref 11.5–14.6)
WBC: 6.7 10*3/uL (ref 4.5–10.5)

## 2010-03-04 ENCOUNTER — Encounter: Payer: Self-pay | Admitting: Internal Medicine

## 2010-03-11 ENCOUNTER — Encounter: Payer: Self-pay | Admitting: Internal Medicine

## 2010-03-28 DIAGNOSIS — D332 Benign neoplasm of brain, unspecified: Secondary | ICD-10-CM | POA: Insufficient documentation

## 2010-03-29 ENCOUNTER — Encounter: Payer: Self-pay | Admitting: Internal Medicine

## 2010-04-04 ENCOUNTER — Ambulatory Visit: Payer: Self-pay | Admitting: Internal Medicine

## 2010-04-04 DIAGNOSIS — R141 Gas pain: Secondary | ICD-10-CM | POA: Insufficient documentation

## 2010-04-04 DIAGNOSIS — R143 Flatulence: Secondary | ICD-10-CM

## 2010-04-04 DIAGNOSIS — R142 Eructation: Secondary | ICD-10-CM

## 2010-04-05 ENCOUNTER — Ambulatory Visit: Payer: Self-pay | Admitting: Internal Medicine

## 2010-04-05 DIAGNOSIS — E538 Deficiency of other specified B group vitamins: Secondary | ICD-10-CM | POA: Insufficient documentation

## 2010-04-05 LAB — CONVERTED CEMR LAB
Basophils Absolute: 0 10*3/uL (ref 0.0–0.1)
Basophils Relative: 0.2 % (ref 0.0–3.0)
CO2: 31 meq/L (ref 19–32)
Chloride: 103 meq/L (ref 96–112)
Eosinophils Absolute: 0 10*3/uL (ref 0.0–0.7)
Eosinophils Relative: 0.1 % (ref 0.0–5.0)
HCT: 38.6 % (ref 36.0–46.0)
Hemoglobin: 13.1 g/dL (ref 12.0–15.0)
Iron: 47 ug/dL (ref 42–145)
Lymphocytes Relative: 17.8 % (ref 12.0–46.0)
Lymphs Abs: 1.3 10*3/uL (ref 0.7–4.0)
MCHC: 33.9 g/dL (ref 30.0–36.0)
MCV: 86.6 fL (ref 78.0–100.0)
Monocytes Absolute: 0.7 10*3/uL (ref 0.1–1.0)
Monocytes Relative: 9.6 % (ref 3.0–12.0)
Neutro Abs: 5.1 10*3/uL (ref 1.4–7.7)
Neutrophils Relative %: 72.3 % (ref 43.0–77.0)
Platelets: 259 10*3/uL (ref 150.0–400.0)
Potassium: 3.5 meq/L (ref 3.5–5.1)
RBC: 4.46 M/uL (ref 3.87–5.11)
RDW: 19.2 % — ABNORMAL HIGH (ref 11.5–14.6)
Saturation Ratios: 12.4 % — ABNORMAL LOW (ref 20.0–50.0)
Sodium: 141 meq/L (ref 135–145)
Transferrin: 269.9 mg/dL (ref 212.0–360.0)
Vitamin B-12: 285 pg/mL (ref 211–911)
WBC: 7.1 10*3/uL (ref 4.5–10.5)

## 2010-04-08 ENCOUNTER — Ambulatory Visit (HOSPITAL_COMMUNITY): Admission: RE | Admit: 2010-04-08 | Discharge: 2010-04-08 | Payer: Self-pay | Admitting: Internal Medicine

## 2010-04-12 ENCOUNTER — Ambulatory Visit: Payer: Self-pay | Admitting: Internal Medicine

## 2010-04-12 LAB — CONVERTED CEMR LAB
CO2: 30 meq/L (ref 19–32)
Chloride: 103 meq/L (ref 96–112)
Potassium: 4.2 meq/L (ref 3.5–5.1)
Sodium: 141 meq/L (ref 135–145)

## 2010-04-19 ENCOUNTER — Ambulatory Visit: Payer: Self-pay | Admitting: Internal Medicine

## 2010-04-26 ENCOUNTER — Ambulatory Visit: Payer: Self-pay | Admitting: Internal Medicine

## 2010-04-28 ENCOUNTER — Encounter (HOSPITAL_COMMUNITY)
Admission: RE | Admit: 2010-04-28 | Discharge: 2010-07-27 | Payer: Self-pay | Source: Home / Self Care | Admitting: Internal Medicine

## 2010-04-29 ENCOUNTER — Telehealth: Payer: Self-pay | Admitting: Internal Medicine

## 2010-04-29 ENCOUNTER — Encounter: Payer: Self-pay | Admitting: Internal Medicine

## 2010-05-09 ENCOUNTER — Encounter: Payer: Self-pay | Admitting: Internal Medicine

## 2010-05-11 ENCOUNTER — Ambulatory Visit: Payer: Self-pay | Admitting: Internal Medicine

## 2010-05-12 LAB — CONVERTED CEMR LAB
Basophils Absolute: 0 10*3/uL (ref 0.0–0.1)
Basophils Relative: 0.1 % (ref 0.0–3.0)
Eosinophils Absolute: 0 10*3/uL (ref 0.0–0.7)
Eosinophils Relative: 0.2 % (ref 0.0–5.0)
HCT: 40.1 % (ref 36.0–46.0)
Hemoglobin: 13.8 g/dL (ref 12.0–15.0)
Lymphocytes Relative: 13.6 % (ref 12.0–46.0)
Lymphs Abs: 1.1 10*3/uL (ref 0.7–4.0)
MCHC: 34.4 g/dL (ref 30.0–36.0)
MCV: 90.4 fL (ref 78.0–100.0)
Monocytes Absolute: 0.6 10*3/uL (ref 0.1–1.0)
Monocytes Relative: 7.9 % (ref 3.0–12.0)
Neutro Abs: 6.1 10*3/uL (ref 1.4–7.7)
Neutrophils Relative %: 78.2 % — ABNORMAL HIGH (ref 43.0–77.0)
Platelets: 222 10*3/uL (ref 150.0–400.0)
Potassium: 4.2 meq/L (ref 3.5–5.1)
RBC: 4.44 M/uL (ref 3.87–5.11)
RDW: 19.9 % — ABNORMAL HIGH (ref 11.5–14.6)
WBC: 7.8 10*3/uL (ref 4.5–10.5)

## 2010-05-17 ENCOUNTER — Encounter: Payer: Self-pay | Admitting: Internal Medicine

## 2010-05-19 ENCOUNTER — Encounter: Payer: Self-pay | Admitting: Internal Medicine

## 2010-05-27 ENCOUNTER — Ambulatory Visit: Payer: Self-pay | Admitting: Internal Medicine

## 2010-06-17 ENCOUNTER — Encounter: Admission: RE | Admit: 2010-06-17 | Discharge: 2010-06-17 | Payer: Self-pay | Admitting: Obstetrics and Gynecology

## 2010-06-21 ENCOUNTER — Encounter: Payer: Self-pay | Admitting: Internal Medicine

## 2010-06-22 ENCOUNTER — Ambulatory Visit: Payer: Self-pay | Admitting: Internal Medicine

## 2010-06-24 ENCOUNTER — Encounter: Payer: Self-pay | Admitting: Internal Medicine

## 2010-06-28 ENCOUNTER — Encounter: Admission: RE | Admit: 2010-06-28 | Discharge: 2010-06-28 | Payer: Self-pay | Admitting: Obstetrics and Gynecology

## 2010-07-04 ENCOUNTER — Ambulatory Visit: Payer: Self-pay | Admitting: Internal Medicine

## 2010-07-05 LAB — CONVERTED CEMR LAB
Albumin: 3.8 g/dL (ref 3.5–5.2)
BUN: 17 mg/dL (ref 6–23)
Basophils Absolute: 0 10*3/uL (ref 0.0–0.1)
Basophils Relative: 0.3 % (ref 0.0–3.0)
CO2: 28 meq/L (ref 19–32)
Calcium: 9 mg/dL (ref 8.4–10.5)
Chloride: 103 meq/L (ref 96–112)
Creatinine, Ser: 0.7 mg/dL (ref 0.4–1.2)
Eosinophils Absolute: 0 10*3/uL (ref 0.0–0.7)
Eosinophils Relative: 0.7 % (ref 0.0–5.0)
GFR calc non Af Amer: 101.02 mL/min (ref >60)
Glucose, Bld: 90 mg/dL (ref 70–99)
HCT: 40.9 % (ref 36.0–46.0)
Hemoglobin: 14 g/dL (ref 12.0–15.0)
Iron: 72 ug/dL (ref 42–145)
Lymphocytes Relative: 22.7 % (ref 12.0–46.0)
Lymphs Abs: 1.5 10*3/uL (ref 0.7–4.0)
MCHC: 34.3 g/dL (ref 30.0–36.0)
MCV: 93.5 fL (ref 78.0–100.0)
Monocytes Absolute: 0.7 10*3/uL (ref 0.1–1.0)
Monocytes Relative: 10.1 % (ref 3.0–12.0)
Neutro Abs: 4.5 10*3/uL (ref 1.4–7.7)
Neutrophils Relative %: 66.2 % (ref 43.0–77.0)
Phosphorus: 3 mg/dL (ref 2.3–4.6)
Platelets: 249 10*3/uL (ref 150.0–400.0)
Potassium: 3.7 meq/L (ref 3.5–5.1)
RBC: 4.37 M/uL (ref 3.87–5.11)
RDW: 16.7 % — ABNORMAL HIGH (ref 11.5–14.6)
Saturation Ratios: 21.3 % (ref 20.0–50.0)
Sodium: 140 meq/L (ref 135–145)
Transferrin: 241 mg/dL (ref 212.0–360.0)
Vitamin B-12: 402 pg/mL (ref 211–911)
WBC: 6.8 10*3/uL (ref 4.5–10.5)

## 2010-07-11 ENCOUNTER — Ambulatory Visit: Payer: Self-pay | Admitting: Internal Medicine

## 2010-07-20 ENCOUNTER — Encounter: Payer: Self-pay | Admitting: Internal Medicine

## 2010-07-25 ENCOUNTER — Ambulatory Visit: Payer: Self-pay | Admitting: Internal Medicine

## 2010-08-01 ENCOUNTER — Telehealth: Payer: Self-pay | Admitting: Internal Medicine

## 2010-08-18 ENCOUNTER — Encounter (HOSPITAL_COMMUNITY)
Admission: RE | Admit: 2010-08-18 | Discharge: 2010-09-27 | Payer: Self-pay | Source: Home / Self Care | Attending: Internal Medicine | Admitting: Internal Medicine

## 2010-08-18 ENCOUNTER — Encounter: Payer: Self-pay | Admitting: Internal Medicine

## 2010-08-23 ENCOUNTER — Ambulatory Visit: Payer: Self-pay | Admitting: Internal Medicine

## 2010-08-24 ENCOUNTER — Ambulatory Visit
Admission: RE | Admit: 2010-08-24 | Discharge: 2010-08-24 | Payer: Self-pay | Source: Home / Self Care | Attending: Internal Medicine | Admitting: Internal Medicine

## 2010-08-25 LAB — CONVERTED CEMR LAB: Potassium: 4 meq/L (ref 3.5–5.1)

## 2010-08-27 ENCOUNTER — Encounter: Payer: Self-pay | Admitting: Internal Medicine

## 2010-09-08 ENCOUNTER — Telehealth: Payer: Self-pay | Admitting: Internal Medicine

## 2010-09-18 ENCOUNTER — Encounter: Payer: Self-pay | Admitting: Obstetrics and Gynecology

## 2010-09-19 ENCOUNTER — Telehealth: Payer: Self-pay | Admitting: Internal Medicine

## 2010-09-22 ENCOUNTER — Telehealth: Payer: Self-pay | Admitting: Internal Medicine

## 2010-09-23 ENCOUNTER — Ambulatory Visit
Admission: RE | Admit: 2010-09-23 | Payer: Self-pay | Source: Home / Self Care | Attending: Gastroenterology | Admitting: Gastroenterology

## 2010-09-23 ENCOUNTER — Ambulatory Visit
Admission: RE | Admit: 2010-09-23 | Discharge: 2010-09-23 | Payer: Self-pay | Source: Home / Self Care | Attending: Internal Medicine | Admitting: Internal Medicine

## 2010-09-29 NOTE — Letter (Signed)
Summary: ELectrophysiology/Ablation Procedure Instructions  [place] [place]  [address] [place]   [place],  [address]   Phone: [phone]  Fax: [phone]     Electrophysiology/Ablation Procedure Instructions    You are scheduled for a(n) ablation on Friday, October 25, 2009 at 1PM  with Dr. Caryl Comes.  1.  Please come to the [place] [facility] at 11AM on the day of your procedure.  2.  Come prepared to stay overnight.   Please bring your insurance cards and a list of your medications.  3.  Come to the [place] office on 10-15-09 for lab work.  The lab at [facility] is open from 8:30 AM to 1:30 PM and 2:30 PM to 5:00 PM.  The lab at [facility] is open from 7:30 AM to 5:30 PM.  You do not have to be fasting.  4.  Do not have anything to eat or drink after midnight the night before your procedure.     * Occasionally, EP studies and ablations can become lengthy.  Please make your family aware of this before your procedure starts.  Average time ranges from 2-8 hours for EP studies/ablations.  Your physician will locate your family after the procedure with the results.  * If you have any questions after you get home, please call the office at [phone].  Melanie or [employer]

## 2010-09-29 NOTE — Letter (Signed)
Summary: [facility]  [address] [place], [place] [address]   Phone: [phone]  Fax: [phone]       Lauren [name]    07/20/1961    MRN: [id number]        Procedure Day /Date: Monday 07/26/09     Arrival Time: 10:00 am     Procedure Time: 11:00 am     Location of Procedure:                    _x _  [facility] (4th Floor)    PREPARATION FOR FLEXIBLE SIGMOIDOSCOPY WITH MAGNESIUM CITRATE  Prior to the day before your procedure, purchase one 8 oz. bottle of Magnesium Citrate and one Fleet Enema from the laxative section of your drugstore.  _________________________________________________________________________________________________  THE DAY BEFORE YOUR PROCEDURE             DATE: 07/25/09   DAY: Sunday  1.   Have a clear liquid dinner the night before your procedure.  2.   Do not drink anything colored red or purple.  Avoid juices with pulp.  No orange juice.              CLEAR LIQUIDS INCLUDE: Water Jello Ice Popsicles Tea (sugar ok, no milk/cream) Powdered fruit flavored drinks Coffee (sugar ok, no milk/cream) Gatorade Juice: apple, white grape, white cranberry  Lemonade Clear bullion, consomm, broth Carbonated beverages (any kind) Strained chicken noodle soup Hard Candy   3.   At 7:00 pm the night before your procedure, drink one bottle of Magnesium Citrate over ice.  4.   Drink at least 3 more glasses of clear liquids before bedtime (preferably juices).  5.   Results are expected usually within 1 to 6 hours after taking the Magnesium Citrate.  ___________________________________________________________________________________________________  THE DAY OF YOUR PROCEDURE            DATE: 07/26/09   DAY: Monday  1.   Use Fleet Enema one hour prior to coming for procedure.  2.   You may drink clear liquids until 8:00 am (2 hours before exam)       MEDICATION INSTRUCTIONS  Unless otherwise  instructed, you should take regular prescription medications with a small sip of water as early as possible the morning of your procedure.         OTHER INSTRUCTIONS  You will need a responsible adult at least 50 years of age to accompany you and drive you home.   This person must remain in the waiting room during your procedure.  Wear loose fitting clothing that is easily removed.  Leave jewelry and other valuables at home.  However, you may wish to bring a book to read or an iPod/MP3 player to listen to music as you wait for your procedure to start.  Remove all body piercing jewelry and leave at home.  Total time from sign-in until discharge is approximately 2-3 hours.  You should go home directly after your procedure and rest.  You can resume normal activities the day after your procedure.  The day of your procedure you should not:   Drive   Make legal decisions   Operate machinery   Drink alcohol   Return to work  You will receive specific instructions about eating, activities and medications before you leave.   The above instructions have been reviewed and explained to me by   Hortense Ramal, CMA    I fully understand  and can verbalize these instructions _____________________________ Date 07/19/09

## 2010-09-29 NOTE — Progress Notes (Signed)
Summary: post ablation  Phone Note Call from Patient Call back at [phone]   Caller: Patient Reason for Call: Talk to Nurse Summary of Call: pt wants to know if she has any precautions post ablation Initial call taken by: Darnell Level,  October 26, 2009 4:00 PM  Follow-up for Phone Call        Called patient and left message on machine Barnett Abu, RN, BSN  October 27, 2009 11:52 AM  Follow-up by: Barnett Abu, RN, BSN,  October 27, 2009 11:52 AM

## 2010-09-29 NOTE — Assessment & Plan Note (Signed)
Summary: severe GERD/all   History of Present Illness Visit Type: new patient Primary GI MD: [name] MD Primary Provider: Aretta Nip, M.D. Chief Complaint: GERD severe x 7 years  History of Present Illness:   50 year old white female transferring her GI care from Dr. Barnett Abu . She is a mother of Shameeka Silliman CMA.. Mrs Kofman has had an ongoing throat sensation  and throat clearing for past 7 years and  she has recently has developed substernal  burning .S he has a feeling of food passing thru her esophagus. she denies any dysphagia but has some odynophagia,  especially with orange juice.  ENT evaluation recently  show inflammatory changes of the posterior pharynx suggestive of reflux patient has never had an endoscopy or biopsy. She has been on proton pump inhibitors for at least 7 years initially Nexiem, she  also tried AcipHex and most recently Prilosec 20 mg twice a day which seems to be quite helpful but over all the cough and clearing of her throat has persisted. Other medical problems include  hypertension, supraventricular tachycardia , history of meningioma , deviated  septum and depression.history of  left-sided ulcerative colitis diagnosed in 2003;  patient has been on Asacol all 2.4 g a day she's currently asymptomatic   GI Review of Systems    Reports acid reflux, bloating, dysphagia with liquids, dysphagia with solids, and  nausea.      Denies abdominal pain, belching, chest pain, heartburn, loss of appetite, vomiting, vomiting blood, weight loss, and  weight gain.      Reports hemorrhoids.     Denies anal fissure, black tarry stools, change in bowel habit, constipation, diarrhea, diverticulosis, fecal incontinence, heme positive stool, irritable bowel syndrome, jaundice, light color stool, liver problems, rectal bleeding, and  rectal pain.     Prior Medications Reviewed Using: List Brought by Patient  Updated Prior Medication List: ASACOL 400 MG  TBEC (MESALAMINE)  take six tabs by mouth once daily BISOPROLOL-HYDROCHLOROTHIAZIDE 5-6.25 MG  TABS (BISOPROLOL-HYDROCHLOROTHIAZIDE) one by mouth once daily OMEPRAZOLE 20 MG  CPDR (OMEPRAZOLE) 1 tablet by mouth once daily  Current Allergies (reviewed today): ! ATENOLOL  Past Medical History:    Reviewed history and no changes required:       Current Problems:        DEPRESSION, HX OF (ICD-V11.8)       ANXIETY (ICD-300.00)       HYPERTENSION (ICD-401.9)       SUPRAVENTRICULAR TACHYCARDIA (ICD-427.89)       Hx of MENINGIOMA (ICD-225.2)       DEVIATED SEPTUM (ICD-470)       GERD (ICD-530.81)       BLEEDING DISORDER (ICD-287.9)       ULCERATIVE COLITIS (ICD-556.9)       HOARSENESS (ICD-784.49)       COUGH, CHRONIC (ICD-786.2)         Past Surgical History:    Reviewed history and no changes required:       Brain surgery 10/08   Family History:    Reviewed history and no changes required:       Family History of Heart Disease: Mother Maternal Grandfather       Family History of Diabetes: Uncle       No FH of Colon Cancer:  Social History:    Reviewed history and no changes required:       Patient has never smoked.        Alcohol Use - no  Risk Factors:  Tobacco use:  never Alcohol use:  no   Review of Systems       The patient complains of anemia, anxiety-new, hearing problems, heart rhythm changes, skin rash, sleeping problems, and urine leakage.     Vital Signs:  Patient Profile:   50 Years Old Female Height:     59 inches Weight:      162.25 pounds BMI:     32.89 BSA:     1.69 Pulse rate:   68 / minute Pulse rhythm:   regular BP sitting:   122 / 74  (left arm)  Vitals Entered By: Milford Cage CMA (February 26, 2008 1:49 PM)                  Physical Exam  General:     Well developed, well nourished, no acute distress. Ears:     decreased hearing in her right ear as a result of recent surgery Neck:     asymmetrical ,  question of fullness in the right side of the  neck rule out the enlarged thyroid Lungs:     Clear throughout to auscultation. Heart:     Regular rate and rhythm; no murmurs, rubs,  or bruits. Abdomen:     abdomen is soft nontender with normoactive bowel sounds liver edge posterior margin low abdomen is normal no scars Rectal:     normoactive tone stool is also Hemoccult negative    Impression & Recommendations:  Problem # 1:  HOARSENESS (ICD-784.49) hoarseness,  cough and clearing of the throat ; patient has been evaluated by ear nose throat specialist and has been followed by Dr. Barnett Abu the stent at the diagnosis although globus sensation in her throat on my exam today she is questionably enlarged  right lobe of the thyroid . Her  symptoms suggestive of  reflux she will need an evaluation with upper endoscopy and biopsies .We will schedule her placement of a bravo probe while she's off her medications . For now,she'll continue on her Prilosec 20 mg twice a day. Because her coughing can in turn precipitate  refluxs  he will be given  Tussinex . half to 1 teaspoon q.h.s. for cough suppression  Problem # 2:  ULCERATIVE COLITIS (ICD-556.9) ulcerative colitis left sided predominantly, she is asymptomatic we will plan to continue Asacol 2.4 g a day   Patient Instructions: 1)  antireflux measures 2)  Prilosec 20 mg p.o. b.i.d. 3)  Upper endoscopy with biopsies and placement of bravo probe 4)  Continue all other medications 5)  Fosamax 1/2-1 teaspoon q.h.s. p.r.n. cough    Prescriptions: TUSSIONEX PENNKINETIC ER 8-10 MG/5ML  LQCR (CHLORPHENIRAMINE-HYDROCODONE) 1/2 -1 tsp q hs  #8 oz x 0   Entered by:   Darcey Nora RN   Authorized by:   Hart Carwin MD   Signed by:   Darcey Nora RN on 02/26/2008   Method used:   Print then Give to Patient   RxID:   [id number] JYNWGNFAO PENNKINETIC ER 8-10 MG/5ML  LQCR (CHLORPHENIRAMINE-HYDROCODONE) 1/2 -1 tsp q hs  #8 oz x 0   Entered by:   Darcey Nora RN   Authorized by:   Hart Carwin  MD   Signed by:   Hart Carwin MD on 02/26/2008   Method used:   Print then Give to Patient   RxID:   [id number]  ]  Appended Document: Endo/Bravo order    Clinical Lists Changes  Orders: Added new  Test order of Bravo Ph Probe (Bravo Ph) - Signed Added new Test order of EGD (EGD) - Signed Added new Test order of EGD (EGD) - Signed

## 2010-09-29 NOTE — Assessment & Plan Note (Signed)
Summary: per check out/sf   Referring Provider:  n/a Primary Provider:  Rita Ohara, MD  CC:  follow up.  Pt feeling well.  Marland Kitchen  History of Present Illness: Lauren [name] is seen in followup for SVT ablation I think was probably AV nodal reentry. She has well without recurrent tachypalpitations over the last 3 months interval which previously she would've anticipated about 5-6 episodes.  She did alert me to the fact that she did not get the discharge instructions related to endocarditis prophylaxis  Current Medications (verified): 1)  Lialda 1.2 Gm Tbec (Mesalamine) .... Four Tablets By Mouth Daily 2)  Prozac 20 Mg Caps (Fluoxetine Hcl) .... Once Daily 3)  Prednisone 20 Mg Tabs (Prednisone) .... Take One Tablet Once Daily 4)  Bentyl 10 Mg Caps (Dicyclomine Hcl) .... Take 1 Capsule By Mouth Two Times A Day As Needed For Cramps 5)  Klor-Con M20 20 Meq Cr-Tabs (Potassium Chloride Crys Cr) .... Take 2 Tablets By Mouth Once Daily X 1 Week Then Recheck 6)  Metoprolol Succinate 50 Mg Xr24h-Tab (Metoprolol Succinate) .... 1/2 Tablet Once Daily 7)  Imuran 50 Mg Tabs (Azathioprine) .... Take 2 By Mouth Daily 8)  Alprazolam 0.5 Mg Tabs (Alprazolam) .... Take 1/2 Tablet By Mouth Every Morning and 1 Tablet By Mouth Before Bedtime While Taking Prednisone  Allergies (verified): 1)  ! Atenolol 2)  ! Keflex  Past History:  Past Medical History: Last updated: 09/17/2009 Current Problems:  Ulcerative colitis- Supraventricular tachycardia History of meningioma status post resection deficit complicating the above requiring hearing aid implant-further complicated by recurrent infections Lauren [name] disease Hoarseness Anxiety/depression  Tachycardia  Past Surgical History: Last updated: 09/17/2009 Brain surgery 10/08 hearing implant  Family History: Last updated: 02/26/2008 Family History of Heart Disease: Mother Maternal Grandfather Family History of Diabetes: Uncle No FH of Colon  Cancer:  Social History: Last updated: 03/04/2009 Married, 1 girl Patient has never smoked.  Alcohol Use - no Patient does not get regular exercise.  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 HEENT Normal.  Neck veins were flat, carotids were brisk.  Lungs were clear.  Heart sounds were regular without murmurs or gallops.  Abdomen was soft with active bowel sounds. There is no clubbing cyanosis or edema. Skin Warm and dry    EKG  Procedure date:  11/10/2009  Findings:      sinus rhythm at 65 Intervals 0.15/0.08/0.39 Axis CLXXX No evidence of pre-excited  Impression & Recommendations:  Problem # 1:  SUPRAVENTRICULAR TACHYCARDIA (ICD-427.89) she is status post catheter ablation without recurrent tachypalpitations. The likelihood that she has recurrent issues is very very small. She will follow up with [place] as needed. Her updated medication list for this problem includes:    Metoprolol Succinate 50 Mg Xr24h-tab (Metoprolol succinate) .Marland Kitchen... 1/2 tablet once daily  Problem # 2:  HYPERTENSION (ICD-401.9) she has overlying hypertension. I suggested that prior to her visit to Dr. Tamala Julian that she discontinue her metoprolol. Is  noted blood pressure medication anyway based on the Lite trial. Alternative antihypertensive therapy would be more appropriate in the event that she needs it. Her updated medication list for this problem includes:    Metoprolol Succinate 50 Mg Xr24h-tab (Metoprolol succinate) .Marland Kitchen... 1/2 tablet once daily

## 2010-09-29 NOTE — Assessment & Plan Note (Signed)
Summary: rev u.c flare....after seeing paula/dn   History of Present Illness Visit Type: follow up Primary GI MD: [name] MD Primary Provider: [name], MD Requesting Provider: n/a Chief Complaint: f/u ulcerative colitis History of Present Illness:   37 J white female with left-sided ulcerative colitis. Recent flareup necessitating prednisone 40 mg daily for 2 weeks. She is down to 35 mg a day. Her symptoms are about 40-50% improved but there is still intermittent blood per rectum and mucus. She has not taken Anusol-HC suppositories because her instruction didn't ask for that. She continues on Asacol total of 4.8 g a day.   GI Review of Systems      Denies abdominal pain, acid reflux, belching, bloating, chest pain, dysphagia with liquids, dysphagia with solids, heartburn, loss of appetite, nausea, vomiting, vomiting blood, weight loss, and  weight gain.        Denies anal fissure, black tarry stools, change in bowel habit, constipation, diarrhea, diverticulosis, fecal incontinence, heme positive stool, hemorrhoids, irritable bowel syndrome, jaundice, light color stool, liver problems, rectal bleeding, and  rectal pain.    Current Medications (verified): 1)  Asacol 400 Mg  Tbec (Mesalamine) .Marland Kitchen.. 12 Tablets By Mouth Daily 2)  Bisoprolol-Hydrochlorothiazide 5-6.25 Mg  Tabs (Bisoprolol-Hydrochlorothiazide) .... One By Mouth Once Daily 3)  Prozac 20 Mg Caps (Fluoxetine Hcl) .... Once Daily 4)  Prednisone 10 Mg Tabs (Prednisone) .... Taking 35mg  Daily  Allergies (verified): 1)  ! Atenolol 2)  ! Keflex  Past History:  Past Medical History: Reviewed history from 06/24/2008 and no changes required. Current Problems:  DEPRESSION, HX OF (ICD-V11.8) ANXIETY (ICD-300.00) HYPERTENSION (ICD-401.9) SUPRAVENTRICULAR TACHYCARDIA (ICD-427.89) Hx of MENINGIOMA (ICD-225.2) DEVIATED SEPTUM (ICD-470) GERD (ICD-530.81) BLEEDING DISORDER (ICD-287.9) ULCERATIVE COLITIS (ICD-556.9) HOARSENESS  (ICD-784.49) COUGH, CHRONIC (ICD-786.2) Acute gastritis  Past Surgical History: Reviewed history from 02/26/2008 and no changes required. Brain surgery 10/08  Family History: Reviewed history from 02/26/2008 and no changes required. Family History of Heart Disease: Mother Maternal Grandfather Family History of Diabetes: Uncle No FH of Colon Cancer:  Social History: Reviewed history from 03/04/2009 and no changes required. Married, 1 girl Patient has never smoked.  Alcohol Use - no Patient does not get regular exercise.  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 Drug Use - no  Review of Systems  The patient denies allergy/sinus, anemia, anxiety-new, arthritis/joint pain, back pain, blood in urine, breast changes/lumps, change in vision, confusion, cough, coughing up blood, depression-new, fainting, fatigue, fever, headaches-new, hearing problems, heart murmur, heart rhythm changes, itching, menstrual pain, muscle pains/cramps, night sweats, nosebleeds, pregnancy symptoms, shortness of breath, skin rash, sleeping problems, sore throat, swelling of feet/legs, swollen lymph glands, thirst - excessive , urination - excessive , urination changes/pain, urine leakage, vision changes, and voice change.         Pertinent positive and negative review of systems were noted in the above HPI. All other ROS was otherwise negative.   Vital Signs:  Patient profile:   50 year old female Height:      59 inches Weight:      169 pounds BMI:     34.26 BSA:     1.72 Pulse rate:   72 / minute Pulse rhythm:   regular BP sitting:   100 / 62  (left arm) Cuff size:   regular  Vitals Entered By: Ok Anis CMA (June 02, 2009 4:48 PM)  Physical Exam  General:  patient does not appear pushing on it Ears:  well-healed surgical scar behind right ear  Mouth:  no thrush Neck:  Supple; no masses or thyromegaly. Abdomen:  soft nontender abdomen with normoactive bowel sounds. No distention.  Liver edge at costal margin Rectal:  anoscopic exam shows normal rectal term mildly erythematous rectal mucosa best a heavy exudate and purulent discharge but no friability or active bleeding. There was perianal erythema   Impression & Recommendations:  Problem # 1:  ULCERATIVE COLITIS (ICD-556.9) left-sided ulcerative colitis improved on steroids as per endoscopic exam today. We will continue to taper of prednisone30 mg a day for 2 weeks then by 5 mg every 2 weeks. I would like to see her when she is down to  20 mgand at that point we will decide if we start of 6-mercaptopurine or Imuran.  Problem # 2:  DIARRHEA (ICD-787.91) not an issue at this time  Problem # 3:  HEMORRHOIDS-EXTERNAL (ICD-455.3) small internal and external hemorrhoids no bleeding  Patient Instructions: 1)  Take Prednisone 30 mg x 2 weeks (beginning 06/03/09) 2)                              25 mg x 2 weeks (beginning 06/17/09) 3)                              20 mg x 2 weeks (beginning 07/01/09) 4)                              15 mg x 2 weeks (beginning 07/15/09) 5)                              10 mg x 2 weeks (beginning 07/29/09) 6)                               5  mg x 2 weeks (beginning 08/12/09) 7)                              2.5 mg x 2 weeks (beginning 08/26/09) 8)  Office Visit 4-6 weeks 9)  The medication list was reviewed and reconciled.  All changed / newly prescribed medications were explained.  A complete medication list was provided to the patient / caregiver. 10)  continue Asacol total of 4.8 g a day 11)  Consider immunomodulators is not improved by next visit 12)  Copy sent to : Dr [name]

## 2010-09-29 NOTE — Consult Note (Signed)
Summary: [facility]  [facility]   Imported By: Marilynne Drivers 10/21/2009 17:01:42  _____________________________________________________________________  External Attachment:    Type:   Image     Comment:   External Document

## 2010-09-29 NOTE — Letter (Signed)
Summary: Results Letter  [facility]  [address] [place], [place] [address]   Phone: [phone]  Fax: [phone]        May 10, 2008 MRN: [id number]    [facility] [address] [place], [place]  [address]    Dear Ms. Sukup,  The biopsies from Your stomach  close to the gastroesophageal junction  show chronic inflammation, which could be caused either by reflux or by presence of retained food.Please continue to take Your medication for acid suppression until we get the results of Your Acid reflus study.           Sincerely,  Hart Carwin MD  This letter has been electronically signed by your physician.  Appended Document: Results Letter Letter mailed to patient.

## 2010-09-29 NOTE — Miscellaneous (Signed)
Summary: Prednisone Refill  Clinical Lists Changes  Medications: Changed medication from PREDNISONE 5 MG TABS (PREDNISONE) Take as directed to PREDNISONE 5 MG TABS (PREDNISONE) Take as directed - Signed Rx of PREDNISONE 5 MG TABS (PREDNISONE) Take as directed;  #100 x 0;  Signed;  Entered by: Lamona Curl CMA (AAMA);  Authorized by: Hart Carwin MD;  Method used: Electronically to [facility]  [place] [address], [place], [place]  [address], Ph: [phone] or [phone], Fax: [phone]    Prescriptions: PREDNISONE 5 MG TABS (PREDNISONE) Take as directed  #100 x 0   Entered by:   Lamona Curl CMA (AAMA)   Authorized by:   Hart Carwin MD   Signed by:   Lamona Curl CMA (AAMA) on 05/17/2010   Method used:   [facility]ronically to        [facility]  [place] [address] (retail)       [address]       [place], [place]  [address]       Ph: [phone] or [phone]       Fax: [phone]   RxID:   [id number]

## 2010-09-29 NOTE — Miscellaneous (Signed)
Summary: Labs  Clinical Lists Changes  Orders: Added new Test order of TLB-CBC Platelet - w/Differential (85025-CBCD) - Signed Added new Test order of TLB-Sedimentation Rate (ESR) (85652-ESR) - Signed Added new Test order of TLB-CMP (Comprehensive Metabolic Pnl) (80053-COMP) - Signed

## 2010-09-29 NOTE — Procedures (Signed)
Summary: Flexible Sigmoidoscopy   Flexible Sigmoidoscopy  Procedure date:  06/24/2008  Comments:      Location: [facility].   Patient Name: Lauren [name], Lauren [name] MRN: [id number] Procedure Procedures: Flexible Proctosigmoidoscopy CPT: [phone].  Personnel: Endoscopist: Barbette Hair. Arlyce Dice, MD.  Indications Symptoms: Diarrhea.  Comments: Recent antibiotic use History  Current Medications: Patient is not currently taking Coumadin.  Pre-Exam Physical: Performed Jun 24, 2008. Entire physical exam was normal. Cardio- pulmonary exam  WNL. Abdominal exam, Mental status exam WNL.  Comments: Patient history reviewed and updated, pre-procedure physical performed prior to initiation of sedationyes Exam Exam: Extent visualized: Descending Colon. Extent of exam: 35 cm. ASA Classification: II. Tolerance: good.  Colon Prep Used none for colon prep.  Sedation Meds: No sedation was given. Fentanyl given IV. Versed given IV.  Findings - MUCOSAL ABNORMALITY: Descending Colon to Rectum. Erosions present, granular present, Friability: spontaneous hemorrhage. Activity level moderate, superficial ulcers present,  ICD9: Colitis, Pseudomembranous: 008.45. Comments: Diffuse, moderate inflammatory changes with excess mucus, 2mm adherent membranes suggestive of pseudomembranous colitis.   Assessment Abnormal examination, see findings above.  Diagnoses: 008.45: Colitis, Pseudomembranous.   Events  Unplanned Intervention: No intervention was required.  Unplanned Events: There were no complications. Plans Medication Plan: Antibiotics: Flagyl/Metronidazole 500 TID, starting Jun 24, 2008 for 10 days.   Comments: Stool for C dificile toxin Disposition: After procedure patient sent to recovery. After recovery patient sent home.  Comments: Pt instructed to call in 2 days to report progress   This report was created from the original endoscopy report, which was reviewed and signed  by the above listed endoscopist.

## 2010-09-29 NOTE — Assessment & Plan Note (Signed)
Summary: NEP/SVT/JML   Referring Provider:  Daneen [name] Primary Provider:  Rita Ohara, MD  CC:  nep/svt/  last episode 2 days ago.  "Heart races really quick and it doesnt matter what activity she is doing".  History of Present Illness: Lauren [name] is seen at the request of Dr. Tamala [name] because of a 10-12 year history of recurrent abrupt onset offset tachycardia palpitations and documented supraventricular tachycardia.  These episodes are unassociated with exertion. They are occurring now her frequency of about once per month. They're associated with chest discomfort and some lightheadedness but no syncope. There is no shortness of breath. They are from positive to diuretic negative. They often last until her terminated by intravenous adenosine; carotid massage and Valsalva have been ineffective.  She does not use caffeine and there have been no over-the-counter cold stimulants   Cardiac evaluation has included a stress test remotely; no echocardiogram has  been done  Current Medications (verified): 1)  Asacol 400 Mg  Tbec (Mesalamine) .Marland Kitchen.. 12 Tablets By Mouth Daily 2)  Prozac 20 Mg Caps (Fluoxetine Hcl) .... Once Daily 3)  Prednisone 10 Mg Tabs (Prednisone) .... Take Two Daily 4)  Bentyl 10 Mg Caps (Dicyclomine Hcl) .... Take 1 Capsule By Mouth Two Times A Day As Needed For Cramps 5)  Klor-Con M20 20 Meq Cr-Tabs (Potassium Chloride Crys Cr) .... Take 2 Tablets By Mouth Once Daily X 1 Week Then Recheck 6)  Digoxin 0.25 Mg/ml Soln (Digoxin) .Marland Kitchen.. 1 Tablet By Mouth Once Daily 7)  Metoprolol Tartrate 50 Mg Tabs (Metoprolol Tartrate) .... One  Tablet By Mouth Once Daily 8)  Imuran 50 Mg Tabs (Azathioprine) .... Take 2 By Mouth Daily  Allergies (verified): 1)  ! Atenolol 2)  ! Keflex  Past History:  Family History: Last updated: 02/26/2008 Family History of Heart Disease: Mother Maternal Grandfather Family History of Diabetes: Uncle No FH of Colon Cancer:  Social History: Last updated:  03/04/2009 Married, 1 girl Patient has never smoked.  Alcohol Use - no Patient does not get regular exercise.  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 Drug Use - no  Past Medical History: Current Problems:  Ulcerative colitis- Supraventricular tachycardia History of meningioma status post resection deficit complicating the above requiring hearing aid implant-further complicated by recurrent infections Lauren [name] disease Hoarseness Anxiety/depression  Tachycardia  Past Surgical History: Brain surgery 10/08 hearing implant  Review of Systems       full review of systems was negative apart from a history of present illness and past medical history except glasses, right-sided facial swelling, sharp left-sided chest pains the other day with breathing;, "spot on kidney", mild arthritis,  Vital Signs:  Patient profile:   50 year old female Height:      59 inches Weight:      175 pounds BMI:     35.47 Pulse rate:   62 / minute Pulse rhythm:   regular BP sitting:   124 / 78  (left arm)  Vitals Entered By: Lauren [name] CMA (September 17, 2009 9:30 AM)  Physical Exam  General:  Alert and orientedmiddle-age Caucasian female appearing her stated age in no acute distress. HEENT  normal ; I did not appreciate a right-sided facial swelling. Neck veins were flat; carotids brisk and full without bruits. No lymphadenopathy. Back without kyphosis. No CVA tendernessLungs clear. Heart sounds regular without murmurs or gallops. PMI nondisplaced. Abdomen soft with active bowel sounds without midline pulsation or hepatomegaly. Femoral pulses and distal pulses intact. Extremities were without  clubbing cyanosis or edemaSkin warm and dry. Neurological exam grossly normal affect was normal and engaging   EKG  Procedure date:  09/17/2009  Findings:      as for the at 76 with intervals of 0.16/0.09/0.40 Axis LXXXVIII Borderline LAE R. prime in lead V1  EKG  Procedure date:   04/18/2005  Findings:      supraventricular tachycardia at 500 ms there is a T wave described on the proximal ST segment in lead V1 and isn't R. prime in V2 and V3  Impression & Recommendations:  Problem # 1:  SUPRAVENTRICULAR TACHYCARDIA (ICD-427.89) The patient has supraventricular tachycardia which might well be AV nodal reentry although I cannot be sure. Her symptoms and her left cardiogram are suggestive of this. This has been a recurring ue with increasing frequency and increasing symptoms. We discussed treatment options including alternative beta blockers and calcium blockers, antiarrhythmic drug therapy with potential for poor arrhythmia as well as catheter ablation. We discussed potential benefits including and 98% likelihood of success with potential complications including but not limited to death perforation and heart block requiring permanent pacemaker implantation.  It is important to note that she has von Willebrand's disease and would need preprocedural DDAVP as previously used presumably directed by hematology  We will need to get ultrasound and we have arranged this to be done at Dr. Thompson [name] office this afternoon  Lauren [name] is a history of meningioma and there is some question as to whether the tumor is growing over the scar tissue present. She will be undergoing repeat MRI next week or so and she'll let us know the conclusions from that in terms of per choosing a date to proceed with catheter ablation Her updated medication list for this problem includes:    Metoprolol Tartrate 50 Mg Tabs (Metoprolol tartrate) ..... One  tablet by mouth once daily  Orders: EKG w/ Interpretation (93000)  Problem # 2:  Hx of MENINGIOMA S/P SURGERY (ICD-225.2) as above  Problem # 3:  ULCERATIVE COLITIS (ICD-556.9) She has ulcerative colitis which appears to be in a more chronic phase now she is on chronic steroids. She spiked some problems with hypokalemia. Will need to just keep a close eye  on this if she proceeds wih catherte ablaotion  Patient Instructions: 1)  Your physician recommends you have an SVT ablation. Information was given to you today. Please call Threasa Beards, RN to schedule when you are ready.   Appended Document: NEP/SVT/JML ****NOTE: Pt has Von Willebrands dz: she will need DDAVP pre procedure directed by hematology. She is getting an [place] today at Dr. Darliss Ridgel office that we need to see results prior to scheduling. Pt also has meningioma in which she is having a repeat MRI to see if it is growing vs scar tissue. Will need these results prior to scheduling procedure as well. ******

## 2010-09-29 NOTE — Miscellaneous (Signed)
Summary: Rx  Clinical Lists Changes  Medications: Changed medication from KLOR-CON M20 20 MEQ CR-TABS (POTASSIUM CHLORIDE CRYS CR) 1 tablet by mouth once daily to KLOR-CON M20 20 MEQ CR-TABS (POTASSIUM CHLORIDE CRYS CR) Take 2 tablets by mouth once daily x 1 week then recheck - Signed Added new medication of ALPRAZOLAM 0.5 MG TABS (ALPRAZOLAM) Take 1/2 tablet by mouth every morning and 1 tablet at bedtime. - Signed Rx of KLOR-CON M20 20 MEQ CR-TABS (POTASSIUM CHLORIDE CRYS CR) Take 2 tablets by mouth once daily x 1 week then recheck;  #60 x 1;  Signed;  Entered by: Hortense Ramal CMA (AAMA);  Authorized by: Hart Carwin MD;  Method used: Electronically to [facility]*, [address] [place], [place], [place]  [address], Ph: [phone], Fax: [phone] Rx of ALPRAZOLAM 0.5 MG TABS (ALPRAZOLAM) Take 1/2 tablet by mouth every morning and 1 tablet at bedtime.;  #45 x 2;  Signed;  Entered by: Hortense Ramal CMA (AAMA);  Authorized by: Hart Carwin MD;  Method used: Printed then faxed to [facility]*, [address] [place], [place], [place]  [address], Ph: [phone], Fax: [phone]    Prescriptions: ALPRAZOLAM 0.5 MG TABS (ALPRAZOLAM) Take 1/2 tablet by mouth every morning and 1 tablet at bedtime.  #45 x 2   Entered by:   Hortense Ramal CMA (AAMA)   Authorized by:   Hart Carwin MD   Signed by:   Hortense Ramal CMA (AAMA) on 07/26/2009   Method used:   Printed then faxed to ...       [facility]* (retail)       [address]       [place], [place]  [address]       Ph: [phone]       Fax: [phone]   RxID:   [id number] KLOR-CON M20 20 MEQ CR-TABS (POTASSIUM CHLORIDE CRYS CR) Take 2 tablets by mouth once daily x 1 week then recheck  #60 x 1   Entered by:   Hortense Ramal CMA (AAMA)   Authorized by:   Hart Carwin MD   Signed by:   Hortense Ramal CMA (AAMA) on 07/26/2009   Method used:   Electronically  to        [facility]* (retail)       [address]       [place], [place]  [address]       Ph: [phone]       Fax: [phone]   RxID:   [id number]

## 2010-09-29 NOTE — Letter (Signed)
Summary: [facility]  [place], [place] [address]   Phone: [phone]  Fax: [phone]      [name] 03, 2010 MRN: [id number]  RE:      Lauren [name]          [address]          [place], [place]  [address]  BCBS ID: [id number] GROUP #: [phone]   To Whom It May Concern:  I am writing this letter on behalf of my patient, Lauren [name], date of birth 11/07/1960. [name] has had severe ulcerative colitis of over ten years duration which up until two years ago was fairly well controlled on minimal dosages of Asacol. However, since first coming to see me two years ago, she has had constant difficulty with colitis flares.In 2009, patient had a flare of pseudomembranous colitis and was placed on antibotics for that. Since that time, [name] has continued to have constant flares of ulcerative colitis. We have increased [name]'s Asacol to it's maximum daily dosage of 4.8 grams in the past without any relief of her symptoms. We have tried the patient on cortenemas and have given her systemic steroids for symptomatic control. Unfortunately, each time we try to decrease [name]'s steroid dosage below 20 mg daily, her diarrhea, rectal bleeding and abdominal pain recurs. Patient is currently on immunomodulators including Imuran 100 mg daily and has switched over to Lialda rather than Asacol. She continues to remain on Prednisone 20 mg daily as well but continues to have daily rectal bleeding, abdominal pain, cramping and urgent diarrhea.   At this point, I feel that it is [name]'s best interest to be started on biologicals and prefer that she begin taking Humira. [name] has Von Willebrand disease and I feel that putting her on a biological such as Remicade that requires intraveneous access would pose a risk to the patient and cause an increased risk for bleeding. We have contacted [facility] regarding coverage of Humira  for the patient and have been disappointed to find that coverage has been denied. We are quickly running out of options for this patient and I request that you reconsider your decision for coverage on this medication. I greatly appreciate your prompt attention to this important matter. If I can be of any assistance to you, please feel free to contact me at my office number, [phone].  Sincerely,     Lowella Bandy. Olevia Perches, MD

## 2010-09-29 NOTE — Assessment & Plan Note (Signed)
Summary: F/U FROM APPT./FLEX ON 06-24-08        [facility]   History of Present Illness Visit Type: follow up Primary GI MD: [name] MD Primary Provider: Aretta Nip, M.D. Chief Complaint: follow-up visit/Flex sig. History of Present Illness:   50 year old white female with chronic cough recently [facility] by Dr.C.Rees, gastroesophageal reflux has been ruled out. The other causes of chronic cough and the considerations is allergic disease and reactive airway disease. Patient will be started on Pulmicort today  Ms. Corralejo has a history of ulcerative colitis diagnosed in 2004. She had a flareup of pseudomembranous colitis approximately 4 weeks ago and underwent flexible sigmoidoscopy and treatment with the Flagyl 250 mg 3 times a day. She has been asymptomatic now for of 2 weeks. She denies rectal bleeding diarrhea or abdominal pain   GI Review of Systems      Denies abdominal pain, acid reflux, belching, bloating, chest pain, dysphagia with liquids, dysphagia with solids, heartburn, loss of appetite, nausea, vomiting, vomiting blood, weight loss, and  weight gain.      Reports constipation.     Denies anal fissure, black tarry stools, change in bowel habit, diarrhea, diverticulosis, fecal incontinence, heme positive stool, hemorrhoids, irritable bowel syndrome, jaundice, light color stool, liver problems, rectal bleeding, and  rectal pain.     Prior Medications Reviewed Using: Patient Recall  Updated Prior Medication List: ASACOL 400 MG  TBEC (MESALAMINE) take as directed BISOPROLOL-HYDROCHLOROTHIAZIDE 5-6.25 MG  TABS (BISOPROLOL-HYDROCHLOROTHIAZIDE) one by mouth once daily  Current Allergies (reviewed today): ! ATENOLOL ! KEFLEX  Past Medical History:    Reviewed history from 06/24/2008 and no changes required:       Current Problems:        DEPRESSION, HX OF (ICD-V11.8)       ANXIETY (ICD-300.00)       HYPERTENSION (ICD-401.9)  SUPRAVENTRICULAR TACHYCARDIA (ICD-427.89)       Hx of MENINGIOMA (ICD-225.2)       DEVIATED SEPTUM (ICD-470)       GERD (ICD-530.81)       BLEEDING DISORDER (ICD-287.9)       ULCERATIVE COLITIS (ICD-556.9)       HOARSENESS (ICD-784.49)       COUGH, CHRONIC (ICD-786.2)       Acute gastritis         Past Surgical History:    Reviewed history from 02/26/2008 and no changes required:       Brain surgery 10/08   Family History:    Reviewed history from 02/26/2008 and no changes required:       Family History of Heart Disease: Mother Maternal Grandfather       Family History of Diabetes: Uncle       No FH of Colon Cancer:  Social History:    Reviewed history from 06/24/2008 and no changes required:       Patient has never smoked.        Alcohol Use - no       Patient does not get regular exercise.     Review of Systems       The patient complains of allergy/sinus, arthritis/joint pain, cough, fatigue, fever, hearing problems, itching, muscle pains/cramps, skin rash, sleeping problems, sore throat, urine leakage, and voice change.     Vital Signs:  Patient Profile:   50 Years Old Female Height:     59 inches Weight:      166.13 pounds BMI:  33.68 Pulse rate:   68 / minute Pulse rhythm:   regular BP sitting:   110 / 68  (left arm)  Vitals Entered By: June McMurray CMA (July 10, 2008 3:19 PM)                  Physical Exam  General:     Well developed, well nourished, no acute distress. Head:     post stone craniotomy right temple for hemangioma. She is decreased hearing in right ear Lungs:     Clear throughout to auscultation. Heart:     Regular rate and rhythm; no murmurs, rubs,  or bruits. Abdomen:     soft abdomen with normoactive bowel sounds, no distention, no focal tenderness    Impression & Recommendations:  Problem # 1:  DIARRHEA (ICD-787.91) content of the problem. Status post pseudomembranous colitis responding to Flagyl 250 mg 3 times a  day. Patient has completed a course of Flagyl and is so back to baseline  Problem # 2:  Hx of MENINGIOMA (ICD-225.2) status post craniotomy one year ago patient is followed for this problem elsewhere  Problem # 3:  COUGH, CHRONIC (ICD-786.2) chronic cough under evaluation by Dr. Madilyn Hook at [facility] She has a followup appointment on December 3 09   Patient Instructions: 1)  continue Asacol 400 mg 6 tablets a day 2)  Return in one year 3)  Recall colonoscopy in January 2012 4)  Copy Sent To:Dr [facility]    ]

## 2010-09-29 NOTE — Procedures (Signed)
Summary: Gastroenterology-Dr  Melchor Amour Office Note  Gastroenterology-Dr Virginia Rochester Office Note   Imported By: Christie Nottingham CMA 03/05/2008 16:32:34  _____________________________________________________________________  External Attachment:    Type:   Image     Comment:   External Document

## 2010-09-29 NOTE — Op Note (Signed)
Summary: Remicade/[place]  Remicade/[facility]   Imported By: Phillis Knack 06/30/2010 07:52:56  _____________________________________________________________________  External Attachment:    Type:   Image     Comment:   External Document

## 2010-09-29 NOTE — Procedures (Signed)
Summary: Gastroenterology PATHOLOGY  Gastroenterology PATHOLOGY   Imported By: Harlow Mares CMA 02/26/2008 13:42:31  _____________________________________________________________________  External Attachment:    Type:   Image     Comment:   External Document

## 2010-09-29 NOTE — Miscellaneous (Signed)
Summary: Orders Update  Clinical Lists Changes  Problems: Added new problem of HEMATOMA (ICD-924.9) Orders: Added new Test order of Arterial Duplex Lower Extremity (Arterial Duplex Low) - Signed Added new Test order of Arterial Duplex Lower Extremity (Arterial Duplex Low) - Signed

## 2010-09-29 NOTE — Procedures (Signed)
Summary: Gastroenterology-Dr Melchor Amour Office Note  Gastroenterology-Dr Virginia Rochester Office Note   Imported By: Christie Nottingham CMA 03/05/2008 16:33:02  _____________________________________________________________________  External Attachment:    Type:   Image     Comment:   External Document

## 2010-09-29 NOTE — Miscellaneous (Signed)
Summary: Rx refills  Clinical Lists Changes  Medications: Changed medication from PREDNISONE 10 MG TABS (PREDNISONE) 3 tablet by once daily to PREDNISONE 10 MG TABS (PREDNISONE) Take as directed - Signed Changed medication from KLOR-CON M20 20 MEQ CR-TABS (POTASSIUM CHLORIDE CRYS CR) Take 2 tablets by mouth once daily x 1 week then recheck to KLOR-CON M20 20 MEQ CR-TABS (POTASSIUM CHLORIDE CRYS CR) Take 2 tablets by mouth once daily x 1 week then recheck - Signed Rx of PREDNISONE 10 MG TABS (PREDNISONE) Take as directed;  #100 x 0;  Signed;  Entered by: Hortense Ramal CMA (AAMA);  Authorized by: Hart Carwin MD;  Method used: Electronically to [facility]*, [address] [place], [place], [place]  [address], Ph: [phone], Fax: [phone] Rx of KLOR-CON M20 20 MEQ CR-TABS (POTASSIUM CHLORIDE CRYS CR) Take 2 tablets by mouth once daily x 1 week then recheck;  #60 x 0;  Signed;  Entered by: Hortense Ramal CMA (AAMA);  Authorized by: Hart Carwin MD;  Method used: Electronically to [facility]*, [address] [place], [place], [place]  [address], Ph: [phone], Fax: [phone]    Prescriptions: KLOR-CON M20 20 MEQ CR-TABS (POTASSIUM CHLORIDE CRYS CR) Take 2 tablets by mouth once daily x 1 week then recheck  #60 x 0   Entered by:   Hortense Ramal CMA (AAMA)   Authorized by:   Hart Carwin MD   Signed by:   Hortense Ramal CMA (AAMA) on 10/15/2009   Method used:   Electronically to        [facility]* (retail)       [address]       [place], [place]  [address]       Ph: [phone]       Fax: [phone]   RxID:   [id number] PREDNISONE 10 MG TABS (PREDNISONE) Take as directed  #100 x 0   Entered by:   Hortense Ramal CMA (AAMA)   Authorized by:   Hart Carwin MD   Signed by:   Hortense Ramal CMA (AAMA) on 10/15/2009   Method used:   Electronically to        [facility]* (retail)       [address]       [place], [place]  [address]       Ph: [phone]       Fax: [phone]   RxID:   [id number]

## 2010-09-29 NOTE — Miscellaneous (Signed)
Summary: Asacol Rx  Clinical Lists Changes  Medications: Rx of ASACOL 400 MG  TBEC (MESALAMINE) 12 tablets by mouth daily;  #360 x 3;  Signed;  Entered by: Hortense Ramal CMA (AAMA);  Authorized by: Hart Carwin MD;  Method used: Electronically to [facility]*, [address] [place], [place], [place]  [address], Ph: [phone], Fax: [phone]    Prescriptions: ASACOL 400 MG  TBEC (MESALAMINE) 12 tablets by mouth daily  #360 x 3   Entered by:   Hortense Ramal CMA (AAMA)   Authorized by:   Hart Carwin MD   Signed by:   Hortense Ramal CMA (AAMA) on 08/09/2009   Method used:   Electronically to        [facility]* (retail)       [address]       [place], [place]  [address]       Ph: [phone]       Fax: [phone]   RxID:   [id number]

## 2010-09-29 NOTE — Progress Notes (Signed)
Summary: Remicade Infusions  Phone Note Outgoing Call   Call placed by: Laureen Ochs LPN,  [name] 06, 2010 10:25 AM Call placed to: Patient Summary of Call: Per Dr.Savva Beamer--Pt. to begin Remicade infusions. She is scheduled for her 1st 3 at [facility] on 01-14-10 at 10am, 01-28-10 at 10am and 02-25-10 at 10am. She will schedule an infusion for every 8 weeks therafter. Pt. instructed to call back as needed.  Initial call taken by: Laureen Ochs LPN,  [name] 06, 2010 10:27 AM

## 2010-09-29 NOTE — Miscellaneous (Signed)
Summary: Prednsione Refill  Clinical Lists Changes  Medications: Changed medication from PREDNISONE 10 MG TABS (PREDNISONE) Taking 30 mg daily to PREDNISONE 10 MG TABS (PREDNISONE) Take as directed - Signed Rx of PREDNISONE 10 MG TABS (PREDNISONE) Take as directed;  #100 x 1;  Signed;  Entered by: Hortense Ramal CMA (AAMA);  Authorized by: Hart Carwin MD;  Method used: Electronically to [facility]*, [address] [place], [place], [place]  [address], Ph: [phone], Fax: [phone]    Prescriptions: PREDNISONE 10 MG TABS (PREDNISONE) Take as directed  #100 x 1   Entered by:   Hortense Ramal CMA (AAMA)   Authorized by:   Hart Carwin MD   Signed by:   Hortense Ramal CMA (AAMA) on 07/30/2009   Method used:   Electronically to        [facility]* (retail)       [address]       [place], [place]  [address]       Ph: [phone]       Fax: [phone]   RxID:   [id number]

## 2010-09-29 NOTE — Medication Information (Signed)
Summary: Humira DENIED/[facility]  Humira DENIED/[facility]   Imported By: Phillis Knack 10/21/2009 07:12:23  _____________________________________________________________________  External Attachment:    Type:   Image     Comment:   External Document

## 2010-09-29 NOTE — Progress Notes (Signed)
Summary: schedule SVT ablation  Phone Note Outgoing Call Call back at [facility] Phone [phone]   Call placed by: Chanetta Marshall RN BSN,  October 13, 2009 10:18 AM Summary of Call: Spoke with patient.  SVT ablation scheduled for 10-22-09 at 1 PM.  Pt will have labwork done 10-15-09. Instructions left at front desk for patient to pick up at lab appt. Chanetta Marshall RN BSN  October 13, 2009 10:19 AM

## 2010-09-29 NOTE — Progress Notes (Signed)
Summary: Remicade Infusion--triage  Phone Note From Other Clinic Call back at [facility] [phone]   Caller: Nurse--Carol Call For: Dr. Juanda Chance Summary of Call: patient complains of warmness in her chest that last 5-10 min during her infusion it happened 2-3 times during this infusion. She does not remember is she had this feeling during her first infusion, but her vitals are all normal and she is feeling fine now that the infusion is finished. I called Dr. Juanda Chance and she states that the patietn had a recent neg cardiac workup and as long as she is feeling ok and her vitals are normal she can go home. I advised Okey Regal of this and told her to have the patient call if she has anymore symptoms.  Initial call taken by: Harlow Mares CMA Duncan Dull),  April 29, 2010 1:34 PM  Follow-up for Phone Call        reviewed and agree. Follow-up by: Hart Carwin MD,  April 30, 2010 9:00 PM

## 2010-09-29 NOTE — Miscellaneous (Signed)
Summary: Add PPD  Clinical Lists Changes  Observations: Added new observation of TB-PPD: Negative (10/11/2009 16:09)

## 2010-09-29 NOTE — Assessment & Plan Note (Signed)
Summary: 266.2...b12 injection monthly//dns  Nurse Visit   Allergies: 1)  ! Atenolol 2)  ! Keflex  Medication Administration  Injection # 1:    Medication: Vit B12 1000 mcg    Diagnosis: VITAMIN B12 DEFICIENCY (ICD-266.2)    Route: IM    Site: L deltoid    Exp Date: 8/13    Lot #: [id number]    Mfr: [employer]    Patient tolerated injection without complications    Given by: Milford Cage NCMA (July 11, 2010 4:35 PM)  Orders Added: 1)  Vit B12 1000 mcg [J3420]

## 2010-09-29 NOTE — Progress Notes (Signed)
Summary: Potassium refill  Phone Note Call from Patient   Caller: Patient Summary of Call: Pt needs refill on her Potassium. Initial call taken by: Ashok Cordia RN,  November 25, 2009 1:24 PM  Follow-up for Phone Call        Rx sent.  Dtr notified. Follow-up by: Ashok Cordia RN,  November 25, 2009 1:26 PM    New/Updated Medications: KLOR-CON M20 20 MEQ CR-TABS (POTASSIUM CHLORIDE CRYS CR) Take 2 tablets by mouth once daily x 1 week then recheck Prescriptions: KLOR-CON M20 20 MEQ CR-TABS (POTASSIUM CHLORIDE CRYS CR) Take 2 tablets by mouth once daily x 1 week then recheck  #60 x 1   Entered by:   Ashok Cordia RN   Authorized by:   Hart Carwin MD   Signed by:   Ashok Cordia RN on 11/25/2009   Method used:   Electronically to        [facility]* (retail)       [address]       [place], [place]  [address]       Ph: [phone]       Fax: [phone]   RxID:   [id number]

## 2010-09-29 NOTE — Progress Notes (Signed)
Summary: TRIAGE  Phone Note Call from Patient Call back at [phone]   Caller: Patient Call For: Autumnrose Yore  Reason for Call: Talk to Nurse Details for Reason: TRIAGE  Summary of Call: Pt. thinks she may have a UTI. Initial call taken by: Guadlupe [facility],  October 28, 2008 4:06 PM  Follow-up for Phone Call        Pt. c/o burning with urination, dark urine, lower back pain, difficulty with urination for approx. 1 week. Pt. advised to see her PCP. Pt. instructed to call back as needed.  Follow-up by: Laureen Ochs LPN,  October 28, 2008 4:15 PM

## 2010-09-29 NOTE — Progress Notes (Signed)
Summary: does pt need pre-med before dental work  Phone Note Call from Patient Call back at Work Phone [phone] Call back at [phone]   Caller: Patient Reason for Call: Talk to Nurse Summary of Call: does pt need pre-med work. h/o ablation 2/11 Initial call taken by: Neil Crouch,  December 23, 2009 2:52 PM  Follow-up for Phone Call        Pt needs SBE prophylaxis for dental work for 6 weeks post ablation. She will reach that mark in the next week.  Pt will wait until after then for dental work. Chanetta Marshall RN BSN  December 23, 2009 3:50 PM

## 2010-09-29 NOTE — Assessment & Plan Note (Signed)
Summary: f/u appt/dn   History of Present Illness Visit Type: Follow-up Visit Primary GI MD: [name] MD Primary Provider: [name], MD Chief Complaint: follow-up visit History of Present Illness:   This is a 50 year old white female with ulcerative colitis who is on a prednisone taper. Her last appointment was on 2/252010. She has been able to decrease her prednisone from 30 to 20 mg a day and has cut back on her Asacol from 9 to 6 tablets a day. She had an episode of pseudomembranous colitis in October 2009. She is doing very well, having formed stools. She denies any rectal bleeding or crampy abdominal pain. The urgency has been decreased significantly.   GI Review of Systems    Reports chest pain.      Denies abdominal pain, acid reflux, belching, bloating, dysphagia with liquids, dysphagia with solids, heartburn, loss of appetite, nausea, vomiting, vomiting blood, weight loss, and  weight gain.        Denies anal fissure, black tarry stools, change in bowel habit, constipation, diarrhea, diverticulosis, fecal incontinence, heme positive stool, hemorrhoids, irritable bowel syndrome, jaundice, light color stool, liver problems, rectal bleeding, and  rectal pain. Preventive Screening-Counseling & Management      Drug Use:  no.      Current Medications (verified): 1)  Asacol 400 Mg  Tbec (Mesalamine) .... Take As Directed 2)  Bisoprolol-Hydrochlorothiazide 5-6.25 Mg  Tabs (Bisoprolol-Hydrochlorothiazide) .... One By Mouth Once Daily 3)  Prednisone 10 Mg  Tabs (Prednisone) .... Take As Directed 4)  Prozac 20 Mg Caps (Fluoxetine Hcl) .... Once Daily  Allergies (verified): 1)  ! Atenolol 2)  ! Keflex  Past History:  Past Medical History:    Reviewed history from 06/24/2008 and no changes required:    Current Problems:     DEPRESSION, HX OF (ICD-V11.8)    ANXIETY (ICD-300.00)    HYPERTENSION (ICD-401.9)    SUPRAVENTRICULAR TACHYCARDIA (ICD-427.89)    Hx of MENINGIOMA  (ICD-225.2)    DEVIATED SEPTUM (ICD-470)    GERD (ICD-530.81)    BLEEDING DISORDER (ICD-287.9)    ULCERATIVE COLITIS (ICD-556.9)    HOARSENESS (ICD-784.49)    COUGH, CHRONIC (ICD-786.2)    Acute gastritis  Past Surgical History:    Reviewed history from 02/26/2008 and no changes required:    Brain surgery 10/08  Family History:    Reviewed history from 02/26/2008 and no changes required:       Family History of Heart Disease: Mother Maternal Grandfather       Family History of Diabetes: Uncle       No FH of Colon Cancer:  Social History:    Patient has never smoked.     Alcohol Use - no    Patient does not get regular exercise.     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    Illicit Drug Use - no    Drug Use:  no  Review of Systems  The patient denies allergy/sinus, anemia, anxiety-new, arthritis/joint pain, back pain, blood in urine, breast changes/lumps, change in vision, confusion, cough, coughing up blood, depression-new, fainting, fatigue, fever, headaches-new, hearing problems, heart murmur, heart rhythm changes, itching, menstrual pain, muscle pains/cramps, night sweats, nosebleeds, pregnancy symptoms, shortness of breath, skin rash, sleeping problems, sore throat, swelling of feet/legs, swollen lymph glands, thirst - excessive , urination - excessive , urination changes/pain, urine leakage, vision changes, and voice change.    Vital Signs:  Patient profile:   50 year old female Height:  59 inches Weight:      170 pounds BMI:     34.46 Pulse rate:   64 / minute Pulse rhythm:   regular BP sitting:   116 / 70  (right arm)  Vitals Entered By: Milford Cage CMA (December 18, 2008 1:13 PM)  Physical Exam  General:  Well developed, well nourished, no acute distress. Lungs:  Clear throughout to auscultation. Heart:  Regular rate and rhythm; no murmurs, rubs or bruits. Abdomen:  soft abdomen with normal active bowel sounds. No tenderness in left lower quadrant. No  distention. Liver edge at costal margin. Rectal:  rectal exam reveals a small amount of brown hemoccult positive stool. There are no external hemorrhoids. Extremities:  No clubbing, cyanosis, edema or deformities noted.   Impression & Recommendations:  Problem # 1:  ULCERATIVE COLITIS (ICD-556.9) left-sided ulcerative colitis by history. Patient is in symptomatic remission on a prednisone taper. We will continue to taper the prednisone down by 5 mg every 2 weeks. She should be able to discontinue the prednisone in mid June. She is to increase her Asacol to 9 tablets a day; a total of 3.6 g a day. Her hemoccult positive stool indicates activity of her disease. I will see her again in 3 months. If she develops a flare up while tapering off of the prednisone, I would consider using 6 mercaptopurine for immunosuppression.  Problem # 2:  DIARRHEA (ICD-787.91) Resolved.  Problem # 3:  HOARSENESS (ICD-784.49) hoarseness and cough was evaluated in [place] at the voice center. She has discontinued her PPIs.  Problem # 4:  GERD (ICD-530.81) no symptoms of reflux at this time.  Patient Instructions: 1)  prednisone taper by 5 mg every 2 weeks 2)  Continue Asacol 400 mg 3 tablets 3 times a day 3)  Office visit 3 months 4)  If she develops a flare up, consider use of 6-MP. 5)  Copy sent to : Dr [name] Prescriptions: PREDNISONE 5 MG TABS (PREDNISONE) Take as directed.  #100 x 0   Entered by:   Hortense Ramal CMA   Authorized by:   Hart Carwin   Signed by:   Hortense Ramal CMA on 12/18/2008   Method used:   Electronically to        [facility]* (retail)       [address]       [place], [place]  [address]       Ph: [phone]       Fax: [phone]   RxID:   [id number]

## 2010-09-29 NOTE — Miscellaneous (Signed)
Summary: xanax rx  Clinical Lists Changes  Medications: Changed medication from ALPRAZOLAM 0.5 MG TABS (ALPRAZOLAM) 1 tab at bedtime to ALPRAZOLAM 0.5 MG TABS (ALPRAZOLAM) Take 1/2 tablet by mouth every morning and 1 tablet by mouth before bedtime while taking prednisone - Signed Rx of ALPRAZOLAM 0.5 MG TABS (ALPRAZOLAM) Take 1/2 tablet by mouth every morning and 1 tablet by mouth before bedtime while taking prednisone;  #45 x 1;  Signed;  Entered by: Lamona Curl CMA (AAMA);  Authorized by: Hart Carwin MD;  Method used: Printed then faxed to [facility]*, [address] [place], [place], [place]  [address], Ph: [phone], Fax: [phone]    Prescriptions: ALPRAZOLAM 0.5 MG TABS (ALPRAZOLAM) Take 1/2 tablet by mouth every morning and 1 tablet by mouth before bedtime while taking prednisone  #45 x 1   Entered by:   Lamona Curl CMA (AAMA)   Authorized by:   Hart Carwin MD   Signed by:   Lamona Curl CMA (AAMA) on 12/27/2009   Method used:   Printed then faxed to ...       [facility]* (retail)       [address]       [place], [place]  [address]       Ph: [phone]       Fax: [phone]   RxID:   [id number]

## 2010-09-29 NOTE — Miscellaneous (Signed)
Summary: Imuran Rx  Clinical Lists Changes  Medications: Added new medication of IMURAN 50 MG TABS (AZATHIOPRINE) Take 2 by mouth daily - Signed Rx of IMURAN 50 MG TABS (AZATHIOPRINE) Take 2 by mouth daily;  #60 x 3;  Signed;  Entered by: Jennye Boroughs RN;  Authorized by: Hart Carwin MD;  Method used: Electronically to [facility]*, [address] [place], [place], [place]  [address], Ph: [phone], Fax: [phone]    Prescriptions: IMURAN 50 MG TABS (AZATHIOPRINE) Take 2 by mouth daily  #60 x 3   Entered by:   Jennye Boroughs RN   Authorized by:   Hart Carwin MD   Signed by:   Jennye Boroughs RN on 07/26/2009   Method used:   Electronically to        [facility]* (retail)       [address]       [place], [place]  [address]       Ph: [phone]       Fax: [phone]   RxID:   [id number]

## 2010-09-29 NOTE — Progress Notes (Signed)
Summary: TRIAGE  Phone Note From Pharmacy Call back at [facility] Phone [phone]   Caller: Patient Call For: Dr.Kassandra Meriweather Reason for Call: Talk to Nurse Summary of Call: Patient states that she is having problems with colitis and wants to know what to do Initial call taken by: Tawni Levy,  October 15, 2008 4:17 PM  Follow-up for Phone Call        Pt. c/o 2 weeks of gas, sometimes foul smelling, watery stools with blood.  At other times stools are "real pasty." Abd. cramping and urgency w/BM's. Has 4-15 "small"movements a day. Takes Asacol #7 all at once, every morning. DR.Vivia Rosenburg PLEASE ADVISE  Please start Prednisone 30 mg/day x 1-2 weeks till she sees me in the office ? next week? Follow-up by: Hart Carwin MD,  October 15, 2008 8:47 PM      Appended Document: Med. Update Above MD orders reviewed w/pt. Script to her pharmacy. REV scheduled for 10-22-08 at 3pm. Pt. instructed to call back as needed.    Clinical Lists Changes  Medications: Added new medication of PREDNISONE 10 MG  TABS (PREDNISONE) Take 3 tabs (30mg )  by mouth once daily - Signed Rx of PREDNISONE 10 MG  TABS (PREDNISONE) Take 3 tabs (30mg )  by mouth once daily;  #100 x 1;  Signed;  Entered by: Laureen Ochs LPN;  Authorized by: Hart Carwin MD;  Method used: Electronically to [facility]*, [address] [place], [place], [place]  [address], Ph: [phone], Fax: [phone]    Prescriptions: PREDNISONE 10 MG  TABS (PREDNISONE) Take 3 tabs (30mg )  by mouth once daily  #100 x 1   Entered by:   Laureen Ochs LPN   Authorized by:   Hart Carwin MD   Signed by:   Laureen Ochs LPN on 53/66/4403   Method used:   Electronically to        [facility]* (retail)       [address]       [place], [place]  [address]       Ph: [phone]       Fax: [phone]   RxID:   [id number]

## 2010-09-29 NOTE — Progress Notes (Signed)
Summary: CONDITION UPDATE  ---- Converted from flag ---- ---- 06/26/2008 2:33 PM, Eugene Garnet wrote: [name], Lauren Skiff CALLED TO LET YOU KNOW THAT SHE IS DOING MUCH BETTER .YOU CAN GIVE HER A CALL @ [phone]  HOME OR [phone] CELL.Marland Kitchen ------------------------------  Phone Note Call from Patient   Call For: Dr.Ranae Casebier Summary of Call: Message left for patient to schedule an appointment to f/u with Dr.Brodie and to call us as needed. Initial call taken by: Laureen Ochs LPN,  June 26, 2008 2:57 PM

## 2010-09-29 NOTE — Op Note (Signed)
Summary: Remicade/[place]  Remicade/[place]   Imported By: Sherian Rein 05/09/2010 11:56:46  _____________________________________________________________________  External Attachment:    Type:   Image     Comment:   External Document

## 2010-09-29 NOTE — Progress Notes (Signed)
Summary: Continued Problems with Colitis  Phone Note Other Incoming Call back at [facility] Phone [phone] Call back at [phone]   Caller: Patient Summary of Call: Patient complains of continued diarrhea and urgency along with small amounts of bleeding. She has been taking Imuran 100 mg daily x 2 months and is on 20 mg of prednisone at this time. Patient says that the diarrhea does not wake her at night, however, she has been taking xanax at night and attibutes that to aiding in sleep. Patient has been experiencing continued lower abdominal cramping as well. She has been taking her bentyl only at night. I have advised her to increase her bentyl and take it during the daytime. Initial call taken by: Hortense Ramal CMA Duncan Dull),  September 24, 2009 11:22 AM     Appended Document: Continued Problems with Colitis please increase Prednisone to 30 mg daily and have her see me to discuss biologicals.  Appended Document: Continued Problems with Colitis Spoke with patient and advised her that she should increase back up to 30 mg on her prednisone. I have also scheduled her an appointment with Dr Juanda Chance to discuss biologicals.

## 2010-09-29 NOTE — Miscellaneous (Signed)
Summary: Prednisone  Clinical Lists Changes  Medications: Added new medication of PREDNISONE 5 MG TABS (PREDNISONE) Take as directed - Signed Rx of PREDNISONE 5 MG TABS (PREDNISONE) Take as directed;  #100 x 0;  Signed;  Entered by: Madlyn Frankel CMA (AAMA);  Authorized by: Lafayette Dragon MD;  Method used: Electronically to [facility] [address], [place], [place]  [address], Ph: [phone], Fax: [phone]    Prescriptions: PREDNISONE 5 MG TABS (PREDNISONE) Take as directed  #100 x 0   Entered by:   Madlyn Frankel CMA ([place])   Authorized by:   Lafayette Dragon MD   Signed by:   Madlyn Frankel CMA ([place]) on 07/20/2010   Method used:   Electronically to        [facility] [phone]* (retail)       [address], [place]  [address]       Ph: [phone]       Fax: [phone]   [place]:   [phone]

## 2010-09-29 NOTE — Assessment & Plan Note (Signed)
Summary: F/U FROM COLITIS FLARE/TRIAGE ON 10-15-08      [facility]   History of Present Illness Visit Type: follow up Primary GI MD: [name] MD Primary Provider: [name], MD Chief Complaint: Colitis History of Present Illness:   This is a 50 year old white female with ulcerative colitis since 2004 who is status post colonoscopy in January 2004 showing active ulcerative colitis from 0-45 cm. She was recently evaluated at [facility] in [place] for chronic cough. [name] [name] She was treated for antibiotic associated diarrhea in October 2009. [name], she started having blood per rectum, urgency and passage of diarrheal stools with mucus. She has started on prednisone 30 mg daily and has increased her Asacol to 9 a day which she had previously been reduced to 6 a day. She is slightly improved but not significantly.   GI Review of Systems    Reports abdominal pain, loss of appetite, and  nausea.     Location of  Abdominal pain: lower abdomen.    Denies acid reflux, belching, bloating, chest pain, dysphagia with liquids, dysphagia with solids, heartburn, vomiting, vomiting blood, weight loss, and  weight gain.      Reports diarrhea and  rectal bleeding.     Denies anal fissure, black tarry stools, change in bowel habit, constipation, diverticulosis, fecal incontinence, heme positive stool, hemorrhoids, irritable bowel syndrome, jaundice, light color stool, liver problems, and  rectal pain.   Prior Medications Reviewed Using: Patient Recall  Updated Prior Medication List: ASACOL 400 MG  TBEC (MESALAMINE) take as directed BISOPROLOL-HYDROCHLOROTHIAZIDE 5-6.25 MG  TABS (BISOPROLOL-HYDROCHLOROTHIAZIDE) one by mouth  once daily METRONIDAZOLE 250 MG TABS (METRONIDAZOLE) Take one by mouth three times daily PREDNISONE 10 MG  TABS (PREDNISONE) Take 3 tabs (30mg )  by mouth once daily PROZAC 20 MG CAPS (FLUOXETINE HCL) once daily  Current Allergies (reviewed today): ! ATENOLOL ! KEFLEX Past Medical History:    Reviewed history from 06/24/2008 and no changes required:       Current Problems:        DEPRESSION, HX OF (ICD-V11.8)       ANXIETY (ICD-300.00)       HYPERTENSION (ICD-401.9)       SUPRAVENTRICULAR TACHYCARDIA (ICD-427.89)       Hx of MENINGIOMA (ICD-225.2)       DEVIATED SEPTUM (ICD-470)       GERD (ICD-530.81)       BLEEDING DISORDER (ICD-287.9)       ULCERATIVE COLITIS (ICD-556.9)       HOARSENESS (ICD-784.49)       COUGH, CHRONIC (ICD-786.2)       Acute gastritis         Past Surgical History:    Reviewed history from 02/26/2008 and no changes required:       Brain surgery 10/08   Family History:    Reviewed history from 02/26/2008 and no changes required:       Family History of Heart Disease: Mother Maternal Grandfather       Family History of Diabetes: Uncle       No FH of Colon Cancer:  Social History:    Reviewed history from 06/24/2008 and no changes required:       Patient has never smoked.  Alcohol Use - no       Patient does not get regular exercise.   Review of Systems       The patient complains of anemia, anxiety-new, blood in urine, cough, depression-new, fatigue, hearing problems, sleeping problems, and thirst - excessive.    Vital Signs:  Patient Profile:   50 Years Old Female Height:     59 inches Weight:      162.38 pounds BMI:     32.92 Pulse rate:   76 / minute Pulse rhythm:   regular BP sitting:   124 / 78  (left arm)  Vitals Entered By: June McMurray CMA (October 22, 2008 3:09 PM)                  Physical Exam  General:     alert, oriented and in no distress. Head:     status post craniotomy of right temporal area. Minimal  swelling of right side of the face. Mouth:     no evidence of thrush Neck:     Supple; no masses or thyromegaly. Lungs:     Clear throughout to auscultation. Heart:     Regular rate and rhythm; no murmurs, rubs or bruits. Abdomen:     soft relaxed abdomen with normal active bowel sounds. No focal tenderness and no distention. Rectal:     rectal and anoscopic exam reveals  small external hemorrhoidal tags, a somewhat tender anal canal and first-grade internal hemorrhoids as well as some inflamed mucosa over the rectal ampulla showing friability and exudate consistent with acute proctitis. Psych:     Alert and cooperative. Normal mood and affect.   Impression & Recommendations:  Problem # 1:  ULCERATIVE COLITIS (ICD-556.9) exacerbation of left-sided ulcerative colitis. We need to rule out pancolitis. I doubt that this is antibiotic-related diarrhea. She is somewhat improved on prednisone but needs to continue on the same regimen. We will increase her Asacol back to 3.6 g a day and add cortenemas q.h.s. I would like to obtain baseline chemistries today including a sedimentation rate, metabolic panel, CBC and iron studies. I will see her in 3 weeks to consider reducing her prednisone at that time. Orders: TLB-CBC Platelet - w/Differential (85025-CBCD) TLB-Sedimentation Rate (ESR) (85652-ESR) TLB-IBC Pnl (Iron/FE;Transferrin) (83550-IBC) TLB-CMP (Comprehensive Metabolic Pnl) (80053-COMP)   Problem # 2:  DEPRESSION, HX OF (ICD-V11.8) patient started on Prozac 20 mg a day.  Problem # 3:  Hx of MENINGIOMA (ICD-225.2) status post resection by Dr. Danielle Dess. She just had a followup with him.  Problem # 4:  COUGH, CHRONIC (ICD-786.2) chronic cough for which patient is status post extensive evaluation. The patient has been off Nexium which did not seem to help.   Patient Instructions: 1)  Patient given Asacol HD samples  2)  Patient to increase to Asacol 400 mg 3 tablets three times a day    3)  Patient given cortenemas to take 1 every night x 1 week then 1 every other night 4)  Patient to go to lab for CBC, Sed rate, TIBC, CMET 5)  consider follow-up colonoscopy depending on patient's response to steroids. 6)  Office visit in 3 weeks 7)  Copy Sent To: Dr. Joselyn [name], Dr Danielle Dess, Dr. Suszanne Conners, Dr Aldean Ast, Dr Dalene Carrow  Prescriptions: Laurance Flatten 100 MG/60ML ENEM (HYDROCORTISONE) Insert 1 enema in rectum every night x 1 week. Then, Insert 1 enema into rectum only every other night.  #28 x 0   Entered by:   Hortense Ramal CMA  Authorized by:   Hart Carwin MD   Signed by:   Hortense Ramal CMA on 10/22/2008   Method used:   Electronically to        [facility]* (retail)       [address]       [place], [place]  [address]       Ph: [phone]       Fax: [phone]   RxID:   [id number]

## 2010-09-29 NOTE — Assessment & Plan Note (Signed)
Summary: u.c. flare//dn   History of Present Illness Visit Type: follow up Primary GI MD: [name] MD Primary Provider: [name], MD Requesting Provider: n/a Chief Complaint: ulcerative colitis flare. Pt states diarrhea, nausea, and BRB in stool after BMs History of Present Illness:   Patient followed by Dr. Juanda Chance for ulcerative colitis which until recently had been controlled on Mesalamine. She had a flare last fall but prior to, and since that time she has done fairly well. Worked in hematochezia, diffuse abdominal pain, nausea. Having multiple low volume loose stools a day, mostly post-prandial. Abdominal "spasms" usually relieved after defecation. Recently got refill for generic Prozac (different manufacturer) and symptoms started right after that. Patient googled the medication and found that medication could cause colitis or bleeding ulcers.   Having problems emptying bladder which she attributes to abdominal spasms. Because of poor empyting she has frequent urge to urinate. No hematuria.    GI Review of Systems    Reports nausea.      Denies abdominal pain, acid reflux, belching, bloating, chest pain, dysphagia with liquids, dysphagia with solids, heartburn, loss of appetite, vomiting, vomiting blood, weight loss, and  weight gain.      Reports diarrhea and  rectal bleeding.     Denies anal fissure, black tarry stools, change in bowel habit, constipation, diverticulosis, fecal incontinence, heme positive stool, hemorrhoids, irritable bowel syndrome, jaundice, light color stool, liver problems, and  rectal pain.    Current Medications (verified): 1)  Asacol 400 Mg  Tbec (Mesalamine) .Marland Kitchen.. 12 Tablets By Mouth Daily 2)  Bisoprolol-Hydrochlorothiazide 5-6.25 Mg  Tabs (Bisoprolol-Hydrochlorothiazide) .... One By Mouth Once Daily 3)  Prozac 20 Mg Caps (Fluoxetine Hcl) .... Once Daily  Allergies (verified): 1)  ! Atenolol 2)  ! Keflex  Past History:  Past Medical History:  Reviewed history from 06/24/2008 and no changes required. Current Problems:  DEPRESSION, HX OF (ICD-V11.8) ANXIETY (ICD-300.00) HYPERTENSION (ICD-401.9) SUPRAVENTRICULAR TACHYCARDIA (ICD-427.89) Hx of MENINGIOMA (ICD-225.2) DEVIATED SEPTUM (ICD-470) GERD (ICD-530.81) BLEEDING DISORDER (ICD-287.9) ULCERATIVE COLITIS (ICD-556.9) HOARSENESS (ICD-784.49) COUGH, CHRONIC (ICD-786.2) Acute gastritis  Past Surgical History: Reviewed history from 02/26/2008 and no changes required. Brain surgery 10/08  Family History: Reviewed history from 02/26/2008 and no changes required. Family History of Heart Disease: Mother Maternal Grandfather Family History of Diabetes: Uncle No FH of Colon Cancer:  Social History: Reviewed history from 03/04/2009 and no changes required. Married, 1 girl Patient has never smoked.  Alcohol Use - no Patient does not get regular exercise.  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  The patient denies allergy/sinus, anemia, anxiety-new, arthritis/joint pain, back pain, blood in urine, breast changes/lumps, change in vision, confusion, cough, coughing up blood, depression-new, fainting, fever, headaches-new, heart murmur, heart rhythm changes, itching, menstrual pain, muscle pains/cramps, night sweats, nosebleeds, pregnancy symptoms, shortness of breath, skin rash, sleeping problems, sore throat, swelling of feet/legs, swollen lymph glands, thirst - excessive , urination - excessive , urine leakage, vision changes, and voice change.    Vital Signs:  Patient profile:   50 year old female Height:      59 inches Weight:      171 pounds BMI:     34.66 BSA:     1.73 Pulse rate:   62 / minute Pulse rhythm:   regular BP sitting:   112 / 72  (left arm) Cuff size:  regular  Vitals Entered By: Ok Anis CMA (May 18, 2009 3:07 PM)   Physical Exam  General:  Well developed, well nourished, no acute distress. Head:  Normocephalic and atraumatic. Hearing device implant in right temporal (or parietal bone) Eyes:  Conjunctiva pink, no icterus.  Mouth:  No oral lesions. Tongue moist.  Neck:  Supple; no masse Lungs:  Clear throughout to auscultation. Heart:  Regular rate and rhythm; no murmurs, rubs,  or bruits. Abdomen:  Abdomen soft, nontender, nondistended. No obvious masses or hepatomegaly. No obvious hernias. Normal bowel sounds.  Rectal:  Few mildly inflamed hemorrhoids. No stool in vault but thin blood tinged fluid.  Msk:  Symmetrical with no gross deformities. Normal posture. Extremities:  No palmar erythema, no edema.  Neurologic:  Alert and  oriented x4;  grossly normal neurologically. Skin:  Intact without significant lesions or rashes. Psych:  Alert and cooperative. Normal mood and affect.   Impression & Recommendations:  Problem # 1:  ULCERATIVE COLITIS (ICD-556.9) Assessment Deteriorated Several week history bloody dairrhea, diffuse post-prandial abdominal cramps. Rule out UC flare. Rule out superimposed infectious colitis. Will collect stool studies, start Prednisone, obtain labs. Continue current dose of Asacol. Second flare in a year, may eventually need immunomodulators. Follow up with Dr. Juanda Chance when patient's Prednisone is tapered to 20mg  daily. Further tapering and other medications changes per Dr. Juanda Chance at next follow up.     Orders: TLB-CMP (Comprehensive Metabolic Pnl) (80053-COMP) TLB-CBC Platelet - w/Differential (85025-CBCD) TLB-Sedimentation Rate (ESR) (85652-ESR) TLB-CRP-Full Range (C-Reactive Protein) (86140-FCRP) T-Culture, C-Diff Toxin A/B ([id number])  Problem # 2:  HEMORRHOIDS-EXTERNAL (ICD-455.3) Assessment: Deteriorated May be contributing to hematochezia. Start Anusol supp.  Orders: TLB-CMP (Comprehensive Metabolic Pnl) (80053-COMP) TLB-CBC Platelet - w/Differential  (85025-CBCD) TLB-Sedimentation Rate (ESR) (85652-ESR) TLB-CRP-Full Range (C-Reactive Protein) (86140-FCRP) T-Culture, C-Diff Toxin A/B ([id number])  Patient Instructions: 1)  Please go to the lab, basement level. 2)  Prednisone 40mg  daily x 5days 3)                     30mg  daily x 5 days 4)                     20mg  daily x5 days 5)                     15mg  daily x5 days 6)                     10mg   daily x 5 days 7)                      5mg  daily x5 days 8)                     5mg  every other day for three doses then stop. 9)  Start the Prednisone after you turn in the stool samples. 10)  Dr Juanda Chance wants to see you in 2 weeks. 11)  Advised to stick with a low residue diet  avoiding food that can irritate bowel (see handout).  12)  Copy sent to : [name], MD 13)  The medication list was reviewed and reconciled.  All changed / newly prescribed medications were explained.  A complete medication list was provided to the patient / caregiver. Prescriptions: PREDNISONE 10 MG TABS (PREDNISONE) As directed  #70 x 0   Entered by:   Lowry Ram NCMA   Authorized by:   Gunnar Fusi  Wilmon Pali NP   Signed by:   Lowry Ram NCMA on 05/18/2009   Method used:   Electronically to        [facility]* (retail)       [address]       [place], [place]  [address]       Ph: [phone]       Fax: [phone]   RxID:   [id number] ANUSOL-HC 25 MG SUPP (HYDROCORTISONE ACETATE) Use 1 suppository daily, PM  #10 x 0   Entered by:   Lowry Ram NCMA   Authorized by:   Willette Cluster NP   Signed by:   Lowry Ram NCMA on 05/18/2009   Method used:   Electronically to        [facility]* (retail)       [address]       [place], [place]  [address]       Ph: [phone]       Fax: [phone]   RxID:   [id number]

## 2010-09-29 NOTE — Assessment & Plan Note (Signed)
Summary: MONTHLY B12 SHOT...LSW.  Nurse Visit   Allergies: 1)  ! Atenolol 2)  ! Keflex  Medication Administration  Injection # 1:    Medication: Vit B12 1000 mcg    Diagnosis: VITAMIN B12 DEFICIENCY (ICD-266.2)    Route: IM    Site: L deltoid    Exp Date: 05/28/2012    Lot #: [id number]    Mfr: [employer] Regent    Patient tolerated injection without complications    Given by: Christie Nottingham CMA Duncan Dull) (August 24, 2010 4:17 PM)  Orders Added: 1)  Vit B12 1000 mcg [J3420]

## 2010-09-29 NOTE — Medication Information (Signed)
Summary: Enrollment/[facility]  Enrollment/[facility]   Imported By: Lester [place] 09/01/2010 09:38:14  _____________________________________________________________________  External Attachment:    Type:   Image     Comment:   External Document

## 2010-09-29 NOTE — Assessment & Plan Note (Signed)
Summary: 6 week f/u...dn   History of Present Illness Visit Type: Follow-up Visit Primary GI MD: [name] MD Primary Provider: [name], MD Requesting Provider: n/a Chief Complaint: colitis flare, diarrhea, abdominal spasms  History of Present Illness:   This is a 50 year old white female with ulcerative colitis involving the left colon. She has been in a flareup now for several months and has not responded to prednisone 30 mg a day. She is also on mesalamine 400 mg 12 tablets daily. She still has diarrhea with frequent urgent bowel movements containing blood and mucus. There has been no weight loss, nausea or vomiting. There has been no fever. There is a history of pseudomembranous colitis on a flexible sigmoidoscopy in October 2009. She was then treated with Flagyl and subsequently vancomycin with complete resolution of her symptoms.   GI Review of Systems    Reports abdominal pain, bloating, loss of appetite, and  nausea.       Reports diarrhea and  rectal bleeding.       Current Medications (verified): 1)  Asacol 400 Mg  Tbec (Mesalamine) .Marland Kitchen.. 12 Tablets By Mouth Daily 2)  Prozac 20 Mg Caps (Fluoxetine Hcl) .... Once Daily 3)  Prednisone 10 Mg Tabs (Prednisone) .... Taking 30 Mg Daily 4)  Bentyl 10 Mg Caps (Dicyclomine Hcl) .... Take 1 Capsule By Mouth Two Times A Day As Needed For Cramps 5)  Klor-Con M20 20 Meq Cr-Tabs (Potassium Chloride Crys Cr) .Marland Kitchen.. 1 Tablet By Mouth Once Daily 6)  Digoxin 0.25 Mg/ml Soln (Digoxin) .Marland Kitchen.. 1 Tablet By Mouth Once Daily 7)  Metoprolol Tartrate 50 Mg Tabs (Metoprolol Tartrate) .... 1/2 Tablet By Mouth Once Daily  Allergies (verified): 1)  ! Atenolol 2)  ! Keflex  Past History:  Past Medical History: Current Problems:  DEPRESSION, HX OF (ICD-V11.8) ANXIETY (ICD-300.00) HYPERTENSION (ICD-401.9) SUPRAVENTRICULAR TACHYCARDIA (ICD-427.89) Hx of MENINGIOMA (ICD-225.2) DEVIATED SEPTUM (ICD-470) GERD (ICD-530.81) BLEEDING DISORDER  (ICD-287.9) ULCERATIVE COLITIS (ICD-556.9) HOARSENESS (ICD-784.49) COUGH, CHRONIC (ICD-786.2) Acute gastritis Tachycardia  Past Surgical History: Reviewed history from 02/26/2008 and no changes required. Brain surgery 10/08  Family History: Reviewed history from 02/26/2008 and no changes required. Family History of Heart Disease: Mother Maternal Grandfather Family History of Diabetes: Uncle No FH of Colon Cancer:  Social History: Reviewed history from 03/04/2009 and no changes required. Married, 1 girl Patient has never smoked.  Alcohol Use - no Patient does not get regular exercise.  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  The patient denies allergy/sinus, anemia, anxiety-new, arthritis/joint pain, back pain, blood in urine, breast changes/lumps, change in vision, confusion, cough, coughing up blood, depression-new, fainting, fatigue, fever, headaches-new, hearing problems, heart murmur, heart rhythm changes, itching, menstrual pain, muscle pains/cramps, night sweats, nosebleeds, pregnancy symptoms, shortness of breath, skin rash, sleeping problems, sore throat, swelling of feet/legs, swollen lymph glands, thirst - excessive , urination - excessive , urine leakage, vision changes, and voice change.         Pertinent positive and negative review of systems were noted in the above HPI. All other ROS was otherwise negative.   Vital Signs:  Patient profile:   50 year old female Height:      59 inches Weight:      169.38 pounds BMI:     34.33 Pulse rate:   64 / minute Pulse rhythm:   regular BP sitting:   110 / 76  (  left arm)  Vitals Entered By: Milford Cage NCMA (July 19, 2009 4:07 PM)  Physical Exam  General:  Well developed, well nourished, no acute distress. Eyes:  PERRLA, no icterus. Ears:  status post external ear surgery. Mouth:  No deformity or lesions,  dentition normal. Neck:  Supple; no masses or thyromegaly. Lungs:  Clear throughout to auscultation. Heart:  Regular rate and rhythm; no murmurs, rubs,  or bruits. Abdomen:  Soft, nontender and nondistended. No masses, hepatosplenomegaly or hernias noted. Normal bowel sounds. Rectal:  rectal and anoscopic exam reveals friable mucosa of the rectum with heme positive stool indicating a moderate degree of inflammation. Extremities:  No clubbing, cyanosis, edema or deformities noted. Skin:  Intact without significant lesions or rashes. Psych:  Alert and cooperative. Normal mood and affect.   Impression & Recommendations:  Problem # 1:  ULCERATIVE COLITIS (ICD-556.9) Patient has active ulcerative colitis resistant to steroids and mesalamine. We will consider starting her on Imuran 100 mg a day. However, she wants to have  a trial of antibiotics  which were  quite affective during the last flareup; specifically Z-Pak for 5 days. She will start tomorrow for 5 days. After 5 days, we will most likely put her on Imuran 100 mg daily. We have talked about immunomodulators. Her lab test today are normal with a hemoglobin of 13.4 and a hematocrit 39.8. She is still glucose intolerant and her glucose is about 147. We will have to decrease her prednisone to 20 mg daily. We will do a flexible sigmoidoscopy to assess the extent of her disease. Because of the recent hypokalemia, we will recheck her potassium as well as her fasting glucose next week when she comes for a flexible sigmoidoscopy.  Problem # 2:  DIARRHEA (ICD-787.91) Patient has diarrhea secondary to ulcerative colitis.  Problem # 3:  SUPRAVENTRICULAR TACHYCARDIA (ICD-427.89) Patient had a recent exacerbation of palpitations and tachycardia recently evaluated in the emergency room and was found to be hypokalemic. She is now on potassium replacement. Today's potassium was 3.8.  Other Orders: Flex with Sedation (Flex w/Sed)  Patient Instructions: 1)   continue Asacol 400 mg 12 tablets a day 2)  Continue prednisone 20 mg daily 3)  Z-Pak followed instructions of the package is noted [place] in 5 days we'll call in Imuran 100 mg daily 4)  Fasting glucose and potassium on November 28 5)  Flexible sigmoidoscopy scheduled for November 28 fleets enema prep 6)  Copy sent to :Dr [name] Prescriptions: Christena Deem Z-PAK 250 MG TABS (AZITHROMYCIN) Take as directed  #1 pak x 0   Entered by:   Hortense Ramal CMA (AAMA)   Authorized by:   Hart Carwin MD   Signed by:   Hortense Ramal CMA (AAMA) on 07/19/2009   Method used:   Electronically to        [facility]* (retail)       [address]       [place], [place]  [address]       Ph: [phone]       Fax: [phone]   RxID:   [id number]

## 2010-09-29 NOTE — Letter (Signed)
Summary: [facility]  Infusion [facility]/   Imported By: Bubba Hales 02/10/2010 10:38:09  _____________________________________________________________________  External Attachment:    Type:   Image     Comment:   External Document

## 2010-09-29 NOTE — Progress Notes (Signed)
Summary: Humira coverage denied  ---- Converted from flag ---- ---- 10/19/2009 7:37 PM, Hart Carwin MD wrote: I am not sure, we will wait till she get sthrough the ablation with Dr Graciela Husbands and after the wedding, I hope her colitis will calm down . DB  ---- 10/19/2009 1:50 PM, Laureen Ochs LPN wrote: Lauren [name]. was denied Humira coverage because it isn't approved for Ulcerative Colitis. What now? Deb ------------------------------  Phone Note Outgoing Call   Call placed by: Laureen Ochs LPN,  October 20, 2009 8:17 AM Summary of Call: Above MD orders reviewed with patient's daughter,[name]. Pt. instructed to call back as needed.  Initial call taken by: Laureen Ochs LPN,  October 20, 2009 8:17 AM     Appended Document: Humira coverage denied Patient will call her insurance company and will try to get humira approved.

## 2010-09-29 NOTE — Assessment & Plan Note (Signed)
Summary: 8 wk f/u colitis//dn   History of Present Illness Visit Type: Follow-up Visit Primary GI MD: [name] MD Primary Provider: [name], MD Requesting Provider: n/a Chief Complaint: ulcerative colitis, continuous flare, meds not effective History of Present Illness:   This is a 50 year old white Lauren [name] who has ulcerative colitis predominantly in the left colon of 10 years duration. She has been in a low grade flareup for almost 2 years. She started Remicade infusions earlier this year and has also continued on Imuran 100 mg a day and mesalamine 4.8 g a day. She still sees occasional blood, having 3-4 bowel movements a day. Patient has been on prednisone taper from an initial 30 mg a day to a current 20 mg a day. She has a history of von Willebrand's syndrome, SVT, a recent tick bite status post treatment with doxycycline and history of a meningioma in 2008.   GI Review of Systems    Reports abdominal pain, bloating, and  nausea.      Denies acid reflux, belching, chest pain, dysphagia with liquids, dysphagia with solids, heartburn, loss of appetite, vomiting, vomiting blood, weight loss, and  weight gain.      Reports constipation, diarrhea, and  rectal bleeding.     Denies anal fissure, black tarry stools, change in bowel habit, diverticulosis, fecal incontinence, heme positive stool, hemorrhoids, irritable bowel syndrome, jaundice, light color stool, liver problems, and  rectal pain.    Current Medications (verified): 1)  Lialda 1.2 Gm Tbec (Mesalamine) .... Four Tablets By Mouth Daily 2)  Prozac 20 Mg Caps (Fluoxetine Hcl) .... Once Daily 3)  Prednisone 20 Mg Tabs (Prednisone) .... Take One Tablet Once Daily 4)  Bentyl 10 Mg Caps (Dicyclomine Hcl) .... Take 1 Capsule By Mouth Two Times A Day As Needed For Cramps 5)  Klor-Con M20 20 Meq Cr-Tabs (Potassium Chloride Crys Cr) .... Take 1 Tablets By Mouth Once Daily 6)  Metoprolol Succinate 50 Mg Xr24h-Tab (Metoprolol Succinate)  .... 1/2 Tablet Once Daily 7)  Imuran 50 Mg Tabs (Azathioprine) .... Take 2 By Mouth Daily 8)  Alprazolam 0.5 Mg Tabs (Alprazolam) .... Take 1 Tablet By Mouth Before Bedtime While Taking Prednisone 9)  Remicade 100 Mg Solr (Infliximab) .... Every 8 Weeks  Allergies (verified): 1)  ! Atenolol 2)  ! Keflex  Past History:  Past Medical History: Reviewed history from 09/17/2009 and no changes required. Current Problems:  Ulcerative colitis- Supraventricular tachycardia History of meningioma status post resection deficit complicating the above requiring hearing aid implant-further complicated by recurrent infections Barrett Henle disease Hoarseness Anxiety/depression  Tachycardia  Past Surgical History: Reviewed history from 09/17/2009 and no changes required. Brain surgery 10/08 hearing implant  Family History: Reviewed history from 02/26/2008 and no changes required. Family History of Heart Disease: Mother Maternal Grandfather Family History of Diabetes: Uncle No FH of Colon Cancer:  Social History: Reviewed history from 03/04/2009 and no changes required. Married, 1 girl Patient has never smoked.  Alcohol Use - no Patient does not get regular exercise.  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 Drug Use - no  Review of Systems  The patient denies allergy/sinus, anemia, anxiety-new, arthritis/joint pain, back pain, blood in urine, breast changes/lumps, change in vision, confusion, cough, coughing up blood, depression-new, fainting, fatigue, fever, headaches-new, hearing problems, heart murmur, heart rhythm changes, itching, menstrual pain, muscle pains/cramps, night sweats, nosebleeds, pregnancy symptoms, shortness of breath, skin rash, sleeping problems, sore throat, swelling of feet/legs, swollen lymph glands, thirst - excessive ,  urination - excessive , urination changes/pain, urine leakage, vision changes, and voice change.         Pertinent positive and  negative review of systems were noted in the above HPI. All other ROS was otherwise negative.   Vital Signs:  Patient profile:   50 year old Lauren [name] Height:      59 inches Weight:      172.13 pounds BMI:     34.89 Pulse rate:   60 / minute Pulse rhythm:   regular BP sitting:   120 / 76  (left arm) Cuff size:   regular  Vitals Entered By: June McMurray CMA Duncan Dull) (April 04, 2010 3:58 PM)  Physical Exam  General:  mildly cushingoid, alert and oriented. Eyes:  nonicteric. Mouth:  normal mucosa. Neck:  normal thyroid. Lungs:  Clear throughout to auscultation. Heart:  Regular rate and rhythm; no murmurs, rubs,  or bruits. Abdomen:  soft abdomen with normoactive bowel sounds mildly tender left lower quadrant. No distention. Rectal:  rectal and anoscopic exam reveals some friable rectal mucosa with no active bleeding, some edema and hyperemia, no exudate, findings consistent with mild colitis. Extremities:  no edema. Skin:  Intact without significant lesions or rashes. Psych:  Alert and cooperative. Normal mood and affect.   Impression & Recommendations:  Problem # 1:  ULCERATIVE COLITIS (ICD-556.9) Patient has had ulcerative colitis of 10 years duration. Her last flexible sigmoidoscopy in November 2010 and prior to that in October 2009 showed severe ulcerative colitis from 0-50 cm proven by biopsies. We will need to continue her prednisone taper to avoid long-term steroid dependency. She will continue the rest of her medications. We need to consider a flexible sigmoidoscopy in the fall to assess the extent of the disease. Today, we will check her electrolytes, CBC, B12 and iron studies. Orders: TLB-CBC Platelet - w/Differential (85025-CBCD) TLB-IBC Pnl (Iron/FE;Transferrin) (83550-IBC) TLB-B12, Serum-Total ONLY (00938-H82) TLB-Electrolyte Panel (NA/K/CL/CO2) (80051-LYTES)  Problem # 2:  GERD (ICD-530.81) Patient has bloating and dyspepsia. We will obtain an upper abdominal  ultrasound and give her samples of Prilosec 20 mg p.r.n.  Other Orders: Ultrasound Abdomen (UAS)  Patient Instructions: 1)  Upper abdominal ultrasound. 2)  Electrolytes, CBC, B12 and iron studies to be drawn today. 3)  Decrease prednisone to 15 mg a day for 4 weeks then 10 mg a day for 4 weeks until she sees me in 3 months. 4)  Continue all other medications. 5)  Copy sent to : Dr S.Daub 6)  The medication list was reviewed and reconciled.  All changed / newly prescribed medications were explained.  A complete medication list was provided to the patient / caregiver. Prescriptions: PREDNISONE 5 MG TABS (PREDNISONE) Take as directed  #100 x 0   Entered by:   Lamona Curl CMA (AAMA)   Authorized by:   Hart Carwin MD   Signed by:   Lamona Curl CMA (AAMA) on 04/04/2010   Method used:   Electronically to        [facility]* (retail)       [address]       [place], [place]  [address]       Ph: [phone]       Fax: [phone]   RxID:   [id number]

## 2010-09-29 NOTE — Progress Notes (Signed)
Summary: labs  ---- Converted from flag ---- ---- 07/30/2010 10:43 PM, Hart Carwin MD wrote: She would do best to put her on a regular, automatic blood test schedule CBC, K++, every 3 months, her labs have been very stable, we can change it if it deviates from normal.    ---- 07/29/2010 4:38 PM, Karen Kitchens Witczak-Smith CMA (AAMA) wrote: when does she need labs again...she had labs on 07/04/10 ------------------------------

## 2010-09-29 NOTE — Procedures (Signed)
Summary: Gastroenterology COLON  Gastroenterology COLON   Imported By: Harlow Mares CMA 02/26/2008 13:41:42  _____________________________________________________________________  External Attachment:    Type:   Image     Comment:   External Document

## 2010-09-29 NOTE — Op Note (Signed)
Summary: Remicade/[place]  Remicade/[place]   Imported By: Sherian Rein 08/25/2010 15:50:42  _____________________________________________________________________  External Attachment:    Type:   Image     Comment:   External Document

## 2010-09-29 NOTE — Progress Notes (Signed)
Summary: ? Flare up Colitis  Phone Note Call from Patient Call back at [facility] Phone [phone]   Call For: Dr Juanda Chance Reason for Call: Talk to Nurse Summary of Call: Flare-up ulcerative Colitis. Very complicated to explain to me -would rather just talk to nurse please. Initial call taken by: Leanor Kail [facility],  September 21, 2008 8:32 AM  Follow-up for Phone Call        Pt. was on Cipro 250 two times a day X 4 days, last week. She thinks this caused a Colitis flare-up. Abd. pain only w/BM's, stools are soft formed, some blood in stool and on tissue. Some nausea.  DR.Rena Hunke PLEASE ADVISE Follow-up by: Laureen Ochs LPN,  September 21, 2008 9:56 AM  Additional Follow-up for Phone Call Additional follow up Details #1::        spoke with pt. Sx's x 6 days. Mild diarrhea. Please call in Flagyl 250 mg, # 30 , 1 by mouth three times a day, 1 refill pt asked to increase Asacol to 9/day ( from 6/day) call if Sx's worsen. Additional Follow-up by: Hart Carwin MD,  September 21, 2008 1:22 PM    New/Updated Medications: METRONIDAZOLE 250 MG TABS (METRONIDAZOLE) Take one by mouth three times daily   Prescriptions: METRONIDAZOLE 250 MG TABS (METRONIDAZOLE) Take one by mouth three times daily  #30 x 1   Entered by:   Laureen Ochs LPN   Authorized by:   Hart Carwin MD   Signed by:   Laureen Ochs LPN on 14/78/2956   Method used:   Electronically to        [facility]* (retail)       [address]       [place], [place]  [address]       Ph: [phone]       Fax: [phone]   RxID:   [id number]

## 2010-09-29 NOTE — Progress Notes (Signed)
Summary: Triage: Abdominal Pain  Phone Note Call from Patient Call back at Work Phone [phone] Call back at [phone]   Caller: Patient Call For: Dr Juanda Chance Summary of Call: Patient states that she is down to 20 mg prednisone at this time. However, she is beginning to experience crampy abdominal pain and bloody diarrhea. She would like to know if she needs to increase her prednisone again or if you would recommend something else. Of note, patient has an appointment on 07/19/09. Initial call taken by: Hortense Ramal CMA Duncan Dull),  July 08, 2009 8:34 AM  Follow-up for Phone Call        She ought to go up on prednisone to 30 mg/day till she sees me in the office. Does she have Bentyl 10 mg, #40  1 by mouth two times a day for cramps,1 refill? Follow-up by: Hart Carwin MD,  July 08, 2009 1:02 PM  Additional Follow-up for Phone Call Additional follow up Details #1::        Patient advised to increase prednisone back to 30 mg until she is seen in the office by Dr Juanda Chance. An rx for Bentyl has also been sent to the pharmacy for patient.  Additional Follow-up by: Hortense Ramal CMA (AAMA),  July 08, 2009 1:08 PM    New/Updated Medications: BENTYL 10 MG CAPS (DICYCLOMINE HCL) Take 1 capsule by mouth two times a day as needed for cramps Prescriptions: BENTYL 10 MG CAPS (DICYCLOMINE HCL) Take 1 capsule by mouth two times a day as needed for cramps  #40 x 1   Entered by:   Hortense Ramal CMA (AAMA)   Authorized by:   Hart Carwin MD   Signed by:   Hortense Ramal CMA (AAMA) on 07/08/2009   Method used:   Electronically to        [facility]* (retail)       [address]       [place], [place]  [address]       Ph: [phone]       Fax: [phone]   RxID:   [id number]

## 2010-09-29 NOTE — Miscellaneous (Signed)
Summary: Remicade Infusion  Clinical Lists Changes  Orders: Added new Test order of Remicade Infusion (Remicade) - Signed

## 2010-09-29 NOTE — Assessment & Plan Note (Signed)
Summary: 55month f/u, NEEDS B12 INJECTION TOO! ds   History of Present Illness Visit Type: Follow-up Visit Primary GI MD: [name] MD Primary Provider: [name], MD Requesting Provider: n/a Chief Complaint: F/U 3 months, need b12 injection History of Present Illness:   This is a 50 year old white female with ulcerative colitis of more than 10 years duration, predominantly left-sided. She is steroid-dependent on a prednisone taper. She has done well since she had started her Remicade infusions so we have been able to taper the prednisone down to 12.5 mg daily. She is also on Imuran 100 mg a day and Asacol 4.8 g daily. Her bowel movements are more regular having 2-3 a day but 10 a day on bad days. There is a small amount of blood when she wipes. She has occasional crampy abdominal pain. Her past history is significant for a meningioma in 2008 and von Willebrand's disease. She has been evaluated for SVT.   GI Review of Systems      Denies abdominal pain, acid reflux, belching, bloating, chest pain, dysphagia with liquids, dysphagia with solids, heartburn, loss of appetite, nausea, vomiting, vomiting blood, weight loss, and  weight gain.        Denies anal fissure, black tarry stools, change in bowel habit, constipation, diarrhea, diverticulosis, fecal incontinence, heme positive stool, hemorrhoids, irritable bowel syndrome, jaundice, light color stool, liver problems, rectal bleeding, and  rectal pain.    Current Medications (verified): 1)  Lialda 1.2 Gm Tbec (Mesalamine) .... Four Tablets By Mouth Daily 2)  Prozac 20 Mg Caps (Fluoxetine Hcl) .... Once Daily 3)  Prednisone 1 Mg Tabs (Prednisone) .... Take As Directed. 4)  Bentyl 10 Mg Caps (Dicyclomine Hcl) .... Take 1 Capsule By Mouth Two Times A Day As Needed For Cramps 5)  Klor-Con M20 20 Meq Cr-Tabs (Potassium Chloride Crys Cr) .... Take As Directed 6)  Metoprolol Succinate 50 Mg Xr24h-Tab (Metoprolol Succinate) .Marland Kitchen.. 1 Tablet Once  Daily 7)  Imuran 50 Mg Tabs (Azathioprine) .... Take 2 Tablets By Mouth Once Daily 8)  Alprazolam 0.5 Mg Tabs (Alprazolam) .... Take 1 Tablet By Mouth Before Bedtime While Taking Prednisone 9)  Remicade 100 Mg Solr (Infliximab) .... Every 8 Weeks 10)  Tandem Plus 162-115.2-1 Mg Caps (Fefum-Fepo-Fa-B Cmp-C-Zn-Mn-Cu) .... Take 1 Capsule By Mouth Once A Day  Allergies (verified): 1)  ! Atenolol 2)  ! Keflex  Past History:  Past Medical History: Reviewed history from 09/17/2009 and no changes required. Current Problems:  Ulcerative colitis- Supraventricular tachycardia History of meningioma status post resection deficit complicating the above requiring hearing aid implant-further complicated by recurrent infections Barrett Henle disease Hoarseness Anxiety/depression  Tachycardia  Past Surgical History: Reviewed history from 09/17/2009 and no changes required. Brain surgery 10/08 hearing implant  Family History: Reviewed history from 02/26/2008 and no changes required. Family History of Heart Disease: Mother Maternal Grandfather Family History of Diabetes: Uncle No FH of Colon Cancer:  Social History: Reviewed history from 03/04/2009 and no changes required. Married, 1 girl Patient has never smoked.  Alcohol Use - no Patient does not get regular exercise.  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 Drug Use - no  Review of Systems  The patient denies allergy/sinus, anemia, anxiety-new, arthritis/joint pain, back pain, blood in urine, breast changes/lumps, change in vision, confusion, cough, coughing up blood, depression-new, fainting, fatigue, fever, headaches-new, hearing problems, heart murmur, heart rhythm changes, itching, menstrual pain, muscle pains/cramps, night sweats, nosebleeds, pregnancy symptoms, shortness of breath, skin rash, sleeping problems, sore throat,  swelling of feet/legs, swollen lymph glands, thirst - excessive , urination - excessive ,  urination changes/pain, urine leakage, vision changes, and voice change.         Pertinent positive and negative review of systems were noted in the above HPI. All other ROS was otherwise negative.   Vital Signs:  Patient profile:   50 year old female Height:      59 inches Weight:      172.38 pounds BMI:     34.94 Pulse rate:   64 / minute Pulse rhythm:   regular BP sitting:   122 / 72  (right arm) Cuff size:   regular  Vitals Entered By: June McMurray CMA Duncan Dull) (July 04, 2010 4:01 PM)  Physical Exam  General:  Well developed, well nourished, no acute distress. Eyes:  PERRLA, no icterus. Neck:  Supple; no masses or thyromegaly. Lungs:  Clear throughout to auscultation. Heart:  Regular rate and rhythm; no murmurs, rubs,  or bruits. Abdomen:  soft, nontender, with normoactive bowel sounds. Rectal:  anoscopic exam reveals some mildly erythematous rectal mucosa with no contact bleeding or friability. There is a small amount of mucus and large internal hemorrhoids as well as external hemorrhoids. Stool is trace Hemoccult positive. Extremities:  No clubbing, cyanosis, edema or deformities noted. Skin:  Intact without significant lesions or rashes. Psych:  Alert and cooperative. Normal mood and affect.   Impression & Recommendations:  Problem # 1:  VITAMIN B12 DEFICIENCY (ICD-266.2) We will check patient's B12 levels today. Orders: TLB-CBC Platelet - w/Differential (85025-CBCD) TLB-Renal Function Panel (80069-RENAL) TLB-IBC Pnl (Iron/FE;Transferrin) (83550-IBC) TLB-B12, Serum-Total ONLY (16109-U04)  Problem # 2:  ANXIETY (ICD-300.00) Patient may decrease Xanax 0.25 mg daily.  Problem # 3:  ULCERATIVE COLITIS (ICD-556.9) Patient has predominantly left-sided colitis now going into remission. She is still symptomatic. She is to continue Remicade, Imuran, and azothiaprine as well as her prednisone taper. She is to start tomorrow taking prednisone 10 mg daily. Starting December  1, she should decrease by 1 mg every 2 weeks. Orders: TLB-CBC Platelet - w/Differential (85025-CBCD) TLB-Renal Function Panel (80069-RENAL) TLB-IBC Pnl (Iron/FE;Transferrin) (83550-IBC) TLB-B12, Serum-Total ONLY (54098-J19)  Patient Instructions: 1)  Your physician requests that you go to the basement floor of our office to have the following labwork completed before leaving today: CBC, Renal profile, B12 level and IBC panel. 2)  Please pick up your prescriptions at the pharmacy. Electronic prescription(s) has already been sent for Imuran, Prednisone and Lialda. 3)  Decrease prednisone to 10 mg daily. Starting December 1, decrease prednisone by 1 mg every 2 weeks. 4)  Copy sent to : Lesle Chris, MD 5)  The medication list was reviewed and reconciled.  All changed / newly prescribed medications were explained.  A complete medication list was provided to the patient / caregiver. Prescriptions: IMURAN 50 MG TABS (AZATHIOPRINE) Take 2 tablets by mouth once daily  #60 x 3   Entered by:   Lamona Curl CMA (AAMA)   Authorized by:   Hart Carwin MD   Signed by:   Lamona Curl CMA (AAMA) on 07/04/2010   Method used:   Electronically to        [facility] [phone]* (retail)       [address] [place], [place]  [address]       Ph: [phone]       Fax: [phone]   RxID:   [id number] PREDNISONE 1 MG TABS (  PREDNISONE) Take as directed.  #100 x 0   Entered by:   Lamona Curl CMA (AAMA)   Authorized by:   Hart Carwin MD   Signed by:   Lamona Curl CMA (AAMA) on 07/04/2010   Method used:   Electronically to        [facility] [address] [phone]* (retail)       [address] [place], [place]  [address]       Ph: [phone]       Fax: [phone]   RxID:   [id number] LIALDA 1.2 GM TBEC (MESALAMINE) four tablets by mouth daily  #120 x 3   Entered by:   Lamona Curl CMA (AAMA)   Authorized by:   Hart Carwin MD   Signed by:   Lamona Curl CMA (AAMA) on 07/04/2010   Method used:   Electronically to        [facility] [address] [phone]* (retail)       [address] [place], [place]  [address]       Ph: [phone]       Fax: [phone]   RxID:   [id number]

## 2010-09-29 NOTE — Progress Notes (Signed)
Summary:  TALK TO NURSE  Phone Note Call from Patient Call back at Work Phone [phone] Call back at [phone]   Caller: Patient Reason for Call: Talk to Nurse Summary of Call: PT Lauren [name]. Initial call taken by: Lorraine Lax,  November 01, 2009 11:38 AM  Follow-up for Phone Call        Called patient and left message on machine  Barnett Abu, RN, BSN  November 01, 2009 12:02 PM   Pt has a bleeding disorder and would like for [place] to take a look at her incision sites. While she was in the hospital she rebled on the right femoral incision. Today she has a second knot on the right and it has been achy all day where it was not before. With her d/o I ask her to come in today and we will look at it and Dr. Lovena Le (DOD) can take a look as well. Pt will come around 4pm.  Follow-up by: Barnett Abu, RN, BSN,  November 01, 2009 3:09 PM

## 2010-09-29 NOTE — Procedures (Signed)
Summary: Flexible Sigmoidoscopy  Patient: Lauren [name] Note: All result statuses are Final unless otherwise noted.  Tests: (1) Flexible Sigmoidoscopy (FLX)  FLX Flexible Sigmoidoscopy                             DONE     [facility]     [address]     [place], [place]  [address]           FLEXIBLE SIGMOIDOSCOPY PROCEDURE REPORT           PATIENT:  Lauren [name], Lauren [name]  MR#:  [id number]     BIRTHDATE:  1960-10-12, 48 yrs. old  GENDER:  female           ENDOSCOPIST:  Hedwig Morton. Juanda Chance, MD     Referred by:  [name], M.D.           PROCEDURE DATE:  07/26/2009     PROCEDURE:  Flexible Sigmoidoscopy with biopsy     ASA CLASS:  Class I     INDICATIONS:  hematochezia, diarrhea U. colitis of the left colon     2002,on Asacal 4.8/day, prednisone 30mg /day, bentyl 10 mg tid,     continued bleeding     last flex sigm 05/2008           MEDICATIONS:   Versed 8 mg, Fentanyl 75 mcg           DESCRIPTION OF PROCEDURE:   After the risks benefits and     alternatives of the procedure were thoroughly explained, informed     consent was obtained.  Digital rectal exam was performed and     revealed no rectal masses.   The [id number] endoscope     was introduced through the anus and advanced to the descending     colon, without limitations.  The quality of the prep was good.     The instrument was then slowly withdrawn as the mucosa was fully     examined.     <<PROCEDUREIMAGES>>           Colitis was found in the sigmoid colon. friable bleeding mucosa     from 0-50 cm, c/w severe UC,     normal mucosa above 50 cm With standard forceps, biopsy was     obtained and sent to pathology (see image1, image3, image4, and     image5).  The area of colon examined was normal in appearance (see     image2). normal mucosa above 50 cm   Retroflexed views in the     rectum revealed no abnormalities.    The scope was then withdrawn     from the patient and the procedure terminated.        COMPLICATIONS:  None           ENDOSCOPIC IMPRESSION:     1) Colitis in the sigmoid colon     2) Normal colon     severe colitis 0-50 cm, s/p biopsies     RECOMMENDATIONS:     1) await biopsy results     Prednisone 30 mg/day, Asacal 4.8gm/day     Imuran 100mg  qd, #30, 3 refills     labs were drawn today     OV 4 weeks           REPEAT EXAM:  In 0 year(s) for.           ______________________________  Hedwig Morton. Juanda Chance, MD           CC:           n.     eSIGNED:   Hedwig Morton. Ahlaya Ende at 07/26/2009 10:39 AM           Zadie Rhine, [phone]  Note: An exclamation mark (!) indicates a result that was not dispersed into the flowsheet. Document Creation Date: 07/26/2009 10:39 AM _______________________________________________________________________  (1) Order result status: Final Collection or observation date-time: 07/26/2009 10:25 Requested date-time:  Receipt date-time:  Reported date-time:  Referring Physician:   Ordering Physician: [name] [phone]) Specimen Source:  Source: Launa Grill Order Number: [id number] Lab site:   Appended Document: Flexible Sigmoidoscopy OV already scheduled for 09-03-08 at 3:30pm.  Appended Document: Flexible Sigmoidoscopy     Procedures Next Due Date:    Colonoscopy: 06/2014

## 2010-09-29 NOTE — Progress Notes (Signed)
Summary:  To schedule SVT Ablation  Phone Note Call from Patient Call back at cell# [phone]   Caller: Patient Reason for Call: Talk to Nurse, Talk to Doctor Summary of Call: per pt call the MRI that was needed to schedule ablation has been faxed today and you have all other info needed...patient [facility] at [facility] until 2pm today she would like if you would try to give her a call to follow up on this today Initial call taken by: Shelda Pal,  October 01, 2009 12:28 PM  Follow-up for Phone Call        Mrs. Sleeth said she was released today by Dr. Ellene Route. I will need a copy of that release for Dr. Caryl Comes. I called and s/w Leah in MR's at Dr. Wallene Huh office and she will get it to me today. Our scheduling dept is aware to bolo. Barnett Abu, RN, BSN  October 01, 2009 2:41 PM   Additional Follow-up for Phone Call Additional follow up Details #1::        Per Dr. Caryl Comes, pt will need IV DDAVP 0.33mg/kg mixed in 1046mNS in the holding area prior to procedure. She also should be sent home with Nasal Stimate 1.19m519ml, take 1 squirt in each nare for 5 days. Orders will be sent with ppw to the hospital, when procedure is scheduled. Additional Follow-up by: MelBarnett AbuN, BSN,  October 01, 2009 3:42 PM    Additional Follow-up for Phone Call Additional follow up Details #2::    Called patient and left message on machine. To schedule her SVT Ablation.  MelBarnett AbuN, BSN  October 06, 2009 2:54 PM   returning call, please call after 2pm [phone] Levelebruary 10, 2011 1:03 PM   pt calling again, req call back, JenDarnell Levelebruary 10, 2011 4:29 PM   calling again, req call back asaSan Felipeebruary 14, 2011 10:19 AM  Spoke with pt. She called back to scheduled SVT cadioverdion. Pt states she called on Thursday to speak with Melonie Rn did not get a call back.   Pt states wants to have the cardioversiion on 10/22/09 if  abailable. I let pt know Dr. [place]equita Halt [place] in [place]/ off in PM. [facility] tomorrow 10/12/09 at work @ [phone] works 7 AM to 4 PM. She takes her lunch  from 12:00 to 1:00 PM she can be call on her Cell phone then. NivCarollee SiresN, BSN  October 11, 2009 11:46 AM   Additional Follow-up for Phone Call Additional follow up Details #3:: Details for Additional Follow-up Action Taken: as aboEstanislado Spire

## 2010-09-29 NOTE — Letter (Signed)
Summary: [facility] Short Stay  [facility] Short Stay   Imported By: Lester Wenona 01/31/2010 09:29:10  _____________________________________________________________________  External Attachment:    Type:   Image     Comment:   External Document

## 2010-09-29 NOTE — Progress Notes (Signed)
Summary: Very nice lady-wants to discuss a problem w/nurse  Phone Note Call from Patient Call back at Work Phone [phone] Call back at or [phone] between 1 & 2pm lunch   Call For: Dr Olevia Perches Reason for Call: Talk to Nurse Summary of Call: Having a problems she would like to discuss with nurse. Initial call taken by: Irwin Brakeman [facility],  September 22, 2010 11:50 AM  Follow-up for Phone Call        Message left for patient to call back. on cell number. Leone Payor RN  September 22, 2010 1:26 PM Patient calling to report a feeling like she has never had before. States she has taken the Prednisone 15 mg daily x 3 days the to 10 mg and this is helping the bleeding. It is now slight to none. C/O continued nausea. She has tried Prevacid once a day but it did not help. States she vomited on Sunday x 1, Monday x 1 in PM, Wed. x 1 in PM and this AM after eating breakfast. Has nausea all the time. States she has stomach pain in the center of belly below ribs to belly button or below. Sometimes the stomach pain makes her need to have a BM and sometimes it does not. States she has chills but no fever. She is taking Bentyl daily for stomach cramping. Also states her BP is up and she is seeing her cardiologist for this.  Please, advise. Follow-up by: Leone Payor RN,  September 22, 2010 1:44 PM  Additional Follow-up for Phone Call Additional follow up Details #1::        Dotti's mom, I have spoken to her at lenght, nauseated, vomitted, On Prevacid 30 mg once daily, Abd. sono 03/2010 showed 4 mm polyp. I asked her to stop by  on Friday 09/23/2009 at 4.oo pm, she has a scheduled B12 shot and I will briefly look at her. Additional Follow-up by: Lafayette Dragon MD,  September 22, 2010 5:49 PM    Additional Follow-up for Phone Call Additional follow up Details #2::    Added patient to Dr. Nichola Sizer schedule. Follow-up by: Leone Payor RN,  September 23, 2010 8:51 AM

## 2010-09-29 NOTE — Miscellaneous (Signed)
Summary: Asacol refill  Clinical Lists Changes  Medications: Rx of ASACOL 400 MG  TBEC (MESALAMINE) take as directed;  #300 x 11;  Signed;  Entered by: Christie Nottingham CMA;  Authorized by: Hart Carwin MD;  Method [facility]  [place] [address], [place], [place]  [address], Ph: [phone] or [phone], Fax: [phone]    Prescriptions: ASACOL 400 MG  TBEC (MESALAMINE) take as directed  #300 x 11   Entered by:   Christie Nottingham CMA   Authorized by:   Hart Carwin MD   Signed by:   Christie Nottingham CMA on 05/29/2008   Method used:   [facility]        [facility]  [place] [address] (retail)       [address]       [place], [place]  [address]       Ph: [phone] or [phone]       Fax: [phone]   RxID:   [id number]

## 2010-09-29 NOTE — Miscellaneous (Signed)
  Clinical Lists Changes  Medications: Added new medication of METRONIDAZOLE 500 MG TABS (METRONIDAZOLE) 1 tab 3 times a day - Signed Rx of METRONIDAZOLE 500 MG TABS (METRONIDAZOLE) 1 tab 3 times a day;  #30 x 0;  Signed;  Entered by: Louis Meckel MD;  Authorized by: Louis Meckel MD;  Method used: [facility] to [facility]  [phone]*, [address] [place], [place]  [address], Ph: [phone] or [phone], Fax: [phone]    Prescriptions: METRONIDAZOLE 500 MG TABS (METRONIDAZOLE) 1 tab 3 times a day  #30 x 0   Entered and Authorized by:   Louis Meckel MD   Signed by:   Louis Meckel MD on 06/24/2008   Method used:   Electronically to        [facility]  [phone]* (retail)       [address] [place], [place]  [address]       Ph: [phone] or [phone]       Fax: [phone]   RxID:   [id number]

## 2010-09-29 NOTE — Progress Notes (Signed)
  Phone Note Call from Patient   Summary of Call: flare up of colitis, discussed with daughter . Pt will increase Asacal to 3.6gm/dayx1 week, and if not better, increase to 12/day ( 4.8 gm) Initial call taken by: Hart Carwin MD,  April 30, 2009 6:22 PM

## 2010-09-29 NOTE — Letter (Signed)
[facility]   Imported By: Sherian Rein 02/02/2010 11:16:05  _____________________________________________________________________  External Attachment:    Type:   Image     Comment:   External Document

## 2010-09-29 NOTE — Letter (Signed)
Summary: [facility] Office Note  [facility] Office Note   Imported By: Sallee Provencal 10/26/2009 15:20:47  _____________________________________________________________________  External Attachment:    Type:   Image     Comment:   External Document

## 2010-09-29 NOTE — Op Note (Signed)
Summary: Remicade[facility]  Remicade/   Imported By: Sherian Rein 03/15/2010 09:24:58  _____________________________________________________________________  External Attachment:    Type:   Image     Comment:   External Document

## 2010-09-29 NOTE — Miscellaneous (Signed)
Summary: [facility] Ref[facility]  Clinical Lists Changes  Medications: Added new medication of TANDEM PLUS 162-115.2-1 MG CAPS (FEFUM-FEPO-FA-B CMP-C-ZN-MN-CU) Take 1 capsule by mouth once a day - Signed Rx of TANDEM PLUS 162-115.2-1 MG CAPS (FEFUM-FEPO-FA-B CMP-C-ZN-MN-CU) Take 1 capsule by mouth once a day;  #30 x 2;  Signed;  Entered by: Madlyn Frankel CMA (AAMA);  Authorized by: Lafayette Dragon MD;  Method used: [facility]  [place] [id number] [address], [place], [place]  [address], Ph: [phone] or [phone], Fax: [phone]    Prescriptions: TANDEM PLUS 162-115.2-1 MG CAPS (FEFUM-FEPO-FA-B CMP-C-ZN-MN-CU) Take 1 capsule by mouth once a day  #30 x 2   Entered by:   Madlyn Frankel CMA ([place])   Authorized by:   Lafayette Dragon MD   Signed by:   Laird ([place]) on 05/09/2010   Method used:   [facility]ron[facility] to        [facility]  [place] [address] (retail)       [address]       [place], [place]  [address]       Ph: [phone] or [phone]       Fax: [phone]   RxID:   [id number]

## 2010-09-29 NOTE — Assessment & Plan Note (Signed)
Summary: f/u colitis//dn   History of Present Illness Visit Type: follow up Primary GI MD: [name] MD Primary Provider: [name], MD Requesting Provider: n/a Chief Complaint: F/u for colitis. Pt states that she is better and denies any GI complaints  History of Present Illness:   Thjs is a 50 year old white female with ulcerative colitis. Her last visit with [place] was 07/19/09. A flexible sigmoidoscopy done on 07/26/09 showed severe ulcerative colitis in the rectum. She was at that time, she was started on Imuran 100 mg daily and has continued on Asacol 4.8 g daily as well as prednisone 40 mg. She is about 50% improved still having some soft stools and occasional bleeding.   GI Review of Systems      Denies abdominal pain, acid reflux, belching, bloating, chest pain, dysphagia with liquids, dysphagia with solids, heartburn, loss of appetite, nausea, vomiting, vomiting blood, weight loss, and  weight gain.        Denies anal fissure, black tarry stools, change in bowel habit, constipation, diarrhea, diverticulosis, fecal incontinence, heme positive stool, hemorrhoids, irritable bowel syndrome, jaundice, light color stool, liver problems, rectal bleeding, and  rectal pain.    Current Medications (verified): 1)  Asacol 400 Mg  Tbec (Mesalamine) .Marland Kitchen.. 12 Tablets By Mouth Daily 2)  Prozac 20 Mg Caps (Fluoxetine Hcl) .... Once Daily 3)  Prednisone 10 Mg Tabs (Prednisone) .... Take As Directed 4)  Bentyl 10 Mg Caps (Dicyclomine Hcl) .... Take 1 Capsule By Mouth Two Times A Day As Needed For Cramps 5)  Klor-Con M20 20 Meq Cr-Tabs (Potassium Chloride Crys Cr) .... Take 2 Tablets By Mouth Once Daily X 1 Week Then Recheck 6)  Digoxin 0.25 Mg/ml Soln (Digoxin) .Marland Kitchen.. 1 Tablet By Mouth Once Daily 7)  Metoprolol Tartrate 50 Mg Tabs (Metoprolol Tartrate) .... One  Tablet By Mouth Once Daily 8)  Imuran 50 Mg Tabs (Azathioprine) .... Take 2 By Mouth Daily  Allergies (verified): 1)  ! Atenolol 2)  !  Keflex  Past History:  Past Medical History: Reviewed history from 07/19/2009 and no changes required. Current Problems:  DEPRESSION, HX OF (ICD-V11.8) ANXIETY (ICD-300.00) HYPERTENSION (ICD-401.9) SUPRAVENTRICULAR TACHYCARDIA (ICD-427.89) Hx of MENINGIOMA (ICD-225.2) DEVIATED SEPTUM (ICD-470) GERD (ICD-530.81) BLEEDING DISORDER (ICD-287.9) ULCERATIVE COLITIS (ICD-556.9) HOARSENESS (ICD-784.49) COUGH, CHRONIC (ICD-786.2) Acute gastritis Tachycardia  Past Surgical History: Reviewed history from 02/26/2008 and no changes required. Brain surgery 10/08  Family History: Reviewed history from 02/26/2008 and no changes required. Family History of Heart Disease: Mother Maternal Grandfather Family History of Diabetes: Uncle No FH of Colon Cancer:  Social History: Reviewed history from 03/04/2009 and no changes required. Married, 1 girl Patient has never smoked.  Alcohol Use - no Patient does not get regular exercise.  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 Drug Use - no  Review of Systems  The patient denies allergy/sinus, anemia, anxiety-new, arthritis/joint pain, back pain, blood in urine, breast changes/lumps, change in vision, confusion, cough, coughing up blood, depression-new, fainting, fatigue, fever, headaches-new, hearing problems, heart murmur, heart rhythm changes, itching, menstrual pain, muscle pains/cramps, night sweats, nosebleeds, pregnancy symptoms, shortness of breath, skin rash, sleeping problems, sore throat, swelling of feet/legs, swollen lymph glands, thirst - excessive , urination - excessive , urination changes/pain, urine leakage, vision changes, and voice change.         Pertinent positive and negative review of systems were noted in the above HPI. All other ROS was otherwise negative.   Vital Signs:  Patient profile:   50 year old  female Height:      59 inches Weight:      173 pounds BMI:     35.07 BSA:     1.74 Pulse rate:   66 /  minute Pulse rhythm:   regular BP sitting:   112 / 80  (left arm) Cuff size:   regular  Vitals Entered By: Ok Anis CMA (September 03, 2009 3:56 PM)  Physical Exam  General:  cushingoid. Eyes:  PERRLA, no icterus. Mouth:  No deformity or lesions, dentition normal. Neck:  Supple; no masses or thyromegaly. Lungs:  Clear throughout to auscultation. Heart:  Regular rate and rhythm; no murmurs, rubs,  or bruits. Abdomen:  Soft, nontender and nondistended. No masses, hepatosplenomegaly or hernias noted. Normal bowel sounds. Extremities:  No clubbing, cyanosis, edema or deformities noted. Skin:  Intact without significant lesions or rashes. Psych:  Alert and cooperative. Normal mood and affect.   Impression & Recommendations:  Problem # 1:  ULCERATIVE COLITIS (ICD-556.9) Patient has ulcerative colitis which has symptomatically improved on Imuran 100 mg daily that she started 5 weeks ago. She is still on prednisone 30 mg a day and will start a taper. I will recheck her potassium and hemoglobin A1c today. The plan is to continue the prednisone  taper.. We will consider the use of biologicals if her flareup continues. Orders:w TLB-Renal Function Panel (80069-RENAL) TLB-Potassium (K+) (84132-K) TLB-Glucose, QUANT (82947-GLU) TLB-A1C / Hgb A1C (Glycohemoglobin) (83036-A1C)  Problem # 2:  SUPRAVENTRICULAR TACHYCARDIA (ICD-427.89) Patient has SVT and arrhythmia precipitated by prednisone. She has an appointment with Dr. Berton Mount.  Patient Instructions: 1)  Continue Imuran as directed. 2)  Decrease prednisone as per written instructions. 3)  Potassium, renal profile, glucose and hemoglobin A1C to be drawn today. 4)  Copy sent to : Eve Knapp,MD

## 2010-09-29 NOTE — Progress Notes (Signed)
Summary: Pt need post instuction on ablation and questions about lefting.  Phone Note Call from Patient Call back at Home Phone [phone] Call back at [phone]   Caller: Patient Action Taken: Patient advised to call 911 Summary of Call: Pt have had  ablation on 10/22/09 and the pt want to know if she is able to left tables and chairs and the pt needs  instruction on post ablation Initial call taken by: Delsa Sale,  November 05, 2009 10:01 AM  Follow-up for Phone Call        PER DR Rayann Heman RECOMMENDS NO LIFTING STAIRS OR VIGOROUS  ACTIVITY FOR 7 DAYS.PT AWARE. Follow-up by: Devra Dopp, LPN,  November 06, 7562 10:38 AM

## 2010-09-29 NOTE — Progress Notes (Signed)
Summary: med ?'s  Phone Note Call from Patient Call back at Work Phone [phone] Call back at [phone]   Caller: Patient Call For: Dr. Juanda Chance Reason for Call: Talk to Nurse Summary of Call: questions regarding Remicade Initial call taken by: Vallarie Mare,  [name] 17, 2010 10:40 AM  Follow-up for Phone Call        All patient's questions about Remicade answered.   Follow-up by: Darcey Nora RN, CGRN,  [name] 17, 2010 11:08 AM

## 2010-09-29 NOTE — Assessment & Plan Note (Signed)
Summary: b12...next b12 already scheduled.dn  Nurse Visit   Allergies: 1)  ! Atenolol 2)  ! Keflex  Medication Administration  Injection # 1:    Medication: Vit B12 1000 mcg    Diagnosis: VITAMIN B12 DEFICIENCY (ICD-266.2)    Route: IM    Site: L deltoid    Exp Date: 4/13    Lot #: [id number]    Mfr: [employer]    Patient tolerated injection without complications    Given by: Milford Cage NCMA (April 26, 2010 4:38 PM)  Orders Added: 1)  Vit B12 1000 mcg [J3420]

## 2010-09-29 NOTE — Progress Notes (Signed)
Summary: Prednisone ?  Phone Note Other Incoming   Caller: Patient Summary of Call: Dr Juanda Chance- Patient said when you guys talked on 2/18 about her labs that you said she would need f/u in 6 weeks or so. I think at that point we were thinking she would be on humira by now....since she is not do you still want to see her now or wait until we see if we can get her approved? She is on 30 mg prednisone now and says she has improved somewhat but not a lot.   Initial call taken by: Hortense Ramal CMA Duncan Dull),  November 09, 2009 2:40 PM  Follow-up for Phone Call        Let her go down to Prednisone 20 mg/day x 4 weeks before deciding if we can taper any further Follow-up by:  Hart Carwin MD, November 09, 2009 9:48 pm  Additional Follow-up for Phone Call Additional follow up Details #1::        I have advised the patient to decrease her prednisone to 20 mg daily as Dr Juanda Chance would like to see if the prednisone is even of any benefit at this time. Patient is very reluctant to decrease because "last time I went down to 20 mg, I was very sick." Additional Follow-up by: Hortense Ramal CMA Duncan Dull),  November 10, 2009 8:58 AM    Additional Follow-up for Phone Call Additional follow up Details #2::    Patient called back to say that she thinks she originally misunderstood Dr Juanda Chance. She will continue lialda at 4.8 grams daily and decrease prednisone to 20 mg daily as prescribed. Follow-up by: Hortense Ramal CMA Duncan Dull),  November 10, 2009 4:13 PM

## 2010-09-29 NOTE — Assessment & Plan Note (Signed)
Summary: b12...next injection already scheduled.  Nurse Visit   Allergies: 1)  ! Atenolol 2)  ! Keflex  Medication Administration  Injection # 1:    Medication: Vit B12 1000 mcg    Diagnosis: VITAMIN B12 DEFICIENCY (ICD-266.2)    Route: IM    Site: L deltoid    Exp Date: 10/13    Lot #: [id number]    Mfr: [employer]    Patient tolerated injection without complications    Given by: Lamona Curl CMA (AAMA) (July 25, 2010 4:46 PM)  Orders Added: 1)  Vit B12 1000 mcg [J3420]

## 2010-09-29 NOTE — Assessment & Plan Note (Signed)
Summary: f/u colitis/dn   History of Present Illness Visit Type: Follow-up Visit Primary GI MD: Delfin Edis MD Primary Provider: Rita Ohara, MD Requesting Provider: n/a Chief Complaint: F/u for ulcerative colitis. Pt denies any GI complaints  History of Present Illness:   50 year old white female with the ulcerative colitis predominantly in the left colon of 10 years duration. She is now slowly  going into remission on a prednisone taper  20 mg daily and Lialda 4.8 g daily and  Imuran 100 mg daily. Her urgency, bleeding as well as abdominal discomfort and stool frequency has improved but she still sees blood almost  on  daily basis. Last sedimentation rate was down to 12 with normal potassium of 3.8 and hemoglobin of 12.4. She was denied for Humira citing lack of indication in treatment of UC. We are  planning to appeal.   GI Review of Systems      Denies abdominal pain, acid reflux, belching, bloating, chest pain, dysphagia with liquids, dysphagia with solids, heartburn, loss of appetite, nausea, vomiting, vomiting blood, weight loss, and  weight gain.        Denies anal fissure, black tarry stools, change in bowel habit, constipation, diarrhea, diverticulosis, fecal incontinence, heme positive stool, hemorrhoids, irritable bowel syndrome, jaundice, light color stool, liver problems, rectal bleeding, and  rectal pain.    Current Medications (verified): 1)  Lialda 1.2 Gm Tbec (Mesalamine) .... Four Tablets By Mouth Daily 2)  Prozac 20 Mg Caps (Fluoxetine Hcl) .... Once Daily 3)  Prednisone 10 Mg Tabs (Prednisone) .... 3 Tab Once Daily 4)  Bentyl 10 Mg Caps (Dicyclomine Hcl) .... Take 1 Capsule By Mouth Two Times A Day As Needed For Cramps 5)  Klor-Con M20 20 Meq Cr-Tabs (Potassium Chloride Crys Cr) .... Take 2 Tablets By Mouth Once Daily X 1 Week Then Recheck 6)  Metoprolol Tartrate 50 Mg Tabs (Metoprolol Tartrate) .... 1/2 Tab Once Daily 7)  Imuran 50 Mg Tabs (Azathioprine) .... Take 2 By  Mouth Daily 8)  Alprazolam 0.5 Mg Tabs (Alprazolam) .Marland Kitchen.. 1 Tab At Bedtime  Allergies (verified): 1)  ! Atenolol 2)  ! Keflex  Past History:  Past Medical History: Reviewed history from 09/17/2009 and no changes required. Current Problems:  Ulcerative colitis- Supraventricular tachycardia History of meningioma status post resection deficit complicating the above requiring hearing aid implant-further complicated by recurrent infections Lynnae Prude disease Hoarseness Anxiety/depression  Tachycardia  Past Surgical History: Reviewed history from 09/17/2009 and no changes required. Brain surgery 10/08 hearing implant  Family History: Reviewed history from 02/26/2008 and no changes required. Family History of Heart Disease: Mother Maternal Grandfather Family History of Diabetes: Uncle No FH of Colon Cancer:  Social History: Reviewed history from 03/04/2009 and no changes required. Married, 1 girl Patient has never smoked.  Alcohol Use - no Patient does not get regular exercise.  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 Drug Use - no  Review of Systems  The patient denies allergy/sinus, anemia, anxiety-new, arthritis/joint pain, back pain, blood in urine, breast changes/lumps, change in vision, confusion, cough, coughing up blood, depression-new, fainting, fatigue, fever, headaches-new, hearing problems, heart murmur, heart rhythm changes, itching, menstrual pain, muscle pains/cramps, night sweats, nosebleeds, pregnancy symptoms, shortness of breath, skin rash, sleeping problems, sore throat, swelling of feet/legs, swollen lymph glands, thirst - excessive , urination - excessive , urination changes/pain, urine leakage, vision changes, and voice change.         Pertinent positive and negative review of systems were noted  in the above HPI. All other ROS was otherwise negative.   Vital Signs:  Patient profile:   50 year old female Height:      59 inches Weight:       169 pounds BMI:     34.26 BSA:     1.72 Pulse rate:   64 / minute Pulse rhythm:   irregular BP sitting:   118 / 76  (left arm) Cuff size:   regular  Vitals Entered By: Hope Pigeon CMA (December 03, 2009 1:40 PM)  Physical Exam  General:  slightly cushingoid Eyes:  PERRLA, no icterus. Mouth:  No deformity or lesions, dentition normal. Neck:  Supple; no masses or thyromegaly. Lungs:  Clear throughout to auscultation. Heart:  Regular rate and rhythm; no murmurs, rubs,  or bruits. Abdomen:  soft protuberant abdomen with normoactive bowel sounds and minimal tenderness in left lower quadrant Extremities:  No clubbing, cyanosis, edema or deformities noted. Skin:  Intact without significant lesions or rashes. Psych:  Alert and cooperative. Normal mood and affect.   Impression & Recommendations:  Problem # 1:  ULCERATIVE COLITIS (ICD-556.9) ulcerative colitis of tinea and duration now slowly improving on prednisone 20 mg daily. She will continue on a slow taper of alternating 20 mg and 50 mg for 2 weeks and then gradually of every 2 weeks by 2 and half milligrams. I will see her in 8 weeks. She is to continue on Imuran 100 mg daily and on mesalamine 4.8 g daily  Problem # 2:  VON WILLENBRANDS DISORDER (ICD-287.9) coagulopathy may be contributing to rectal bleeding  Problem # 3:  SUPRAVENTRICULAR TACHYCARDIA (ICD-427.89) status post ablation  Problem # 4:  GERD (ICD-530.81) no symptoms  Patient Instructions: 1)  Start taking Prednisone 89m next Saturday on 4-16-11then alternate to 15 to 24mx 2 weeks. Starting on  12-25-09 then reduce to 15 mg x 2 weeks. Start on 01-08-10 alternate 15 to 1011m 2 weeks.  Then 01-24-10 stay on 98m4mtil 8 week appt with Dr. BrodOlevia Perches)  Stay on Imuran 50mg44mnce daily, Lialda 1.2 gm 4 tablet by mouth once daily 3)  Diflucan 150mg 77mtablet by mouth once daily has been sent to your pharmacy.  4)  Please schedule a follow-up appointment in  8  weeks. 5)  The medication list was reviewed and reconciled.  All changed / newly prescribed medications were explained.  A complete medication list was provided to the patient / caregiver. Prescriptions: DIFLUCAN 150 MG TABS (FLUCONAZOLE) one tablet by mouth once daily  #3 x 0   Entered by:   AmandaMarlon PelAAMA) [place]thorized by:   Dora MLafayette DragonSigned by:   Dora MLafayette Dragon 12/04/2009   Method used:   [facility] to        [facility]       [address] [place] Ph: [phone] Fax: [phone]:   [phone]

## 2010-09-29 NOTE — Letter (Signed)
Summary: Results Letter  [facility]  [address] [place], [place] [address]   Phone: [phone]  Fax: [phone]        May 23, 2008 MRN: [id number]    [facility] [address] [place], [place]  [address]    Dear Ms. Kitner,  The thyroid scan which was done to rule out goiter, was essentially normal. Your right lobe of the thyroid is slightly larger than the left  but still within the normal range.  As far as Your cough is concerned, we did not find the acid reflux to be the cause. I would like to  refer You for a consultation to Voice disorder center in [facility], which specializes in evaluation  ,among other disorders, cough. Please let me know if it is OK to do that and I will send Your tests there. Dotti will help me to arrange it.         Sincerely,  Hart Carwin MD  This letter has been electronically signed by your physician.  Appended Document: Results Letter Letter sent to pt.   Appended Document: Results Letter Patient has been set up to see Dr Madilyn Hook June 11 2008 @ 2:00pm . Christie Nottingham CMA  May 25, 2008 8:21 AM    Clinical Lists Changes  Orders: Added new Referral order of Misc. Referral (Misc. Ref) - Signed

## 2010-09-29 NOTE — Assessment & Plan Note (Signed)
Summary: b12...Marland Kitchennext b12 already scheduled/dn  Nurse Visit   Allergies: 1)  ! Atenolol 2)  ! Keflex  Medication Administration  Injection # 1:    Medication: Vit B12 1000 mcg    Diagnosis: VITAMIN B12 DEFICIENCY (ICD-266.2)    Route: IM    Site: L deltoid    Exp Date: 11/2011    Lot #: [id number]    Mfr: Preston    Comments: pt already scheduled for next b 12    Patient tolerated injection without complications    Given by: Christian Mate CMA Deborra Medina) (April 19, 2010 4:55 PM)  Orders Added: 1)  Vit B12 1000 mcg [R8381]

## 2010-09-29 NOTE — Assessment & Plan Note (Signed)
Summary: discuss biologicals/dn   History of Present Illness Visit Type: Follow-up Visit Primary GI MD: [name] MD Primary Provider: [name], MD Requesting Provider: n/a Chief Complaint: Pt is here to discuss biologics. pt is still having problems with colitis.  History of Present Illness:   50 year old white female with ulcerative colitis also approximately a 10 year duration who now has had continuous flareup of her disease fall most a year and hand. Last office visit 09/03/09. Her medications have included prednisone 30 mg a day which was tapered to 20 mg a day but increased again to 30 mg a day for 2 weeks ago resulting in some improvement of her symptoms. She is also on Imuran 100 mg daily. There is of small amount of black in her stools which are rather frequent full to 5 in the mornings. Her nocturnal diarrhea has decreased over all there has been maybe cannot 20% improvement. Additional medical problems include full Willebrand disease SVT and waiting ablation by Dr. Graciela Husbands and meningioma in October 2008 status post a resection and implant over a hearing aid.   GI Review of Systems    Reports abdominal pain.      Denies acid reflux, belching, bloating, chest pain, dysphagia with liquids, dysphagia with solids, heartburn, loss of appetite, nausea, vomiting, vomiting blood, weight loss, and  weight gain.      Reports diarrhea.     Denies anal fissure, black tarry stools, change in bowel habit, constipation, diverticulosis, fecal incontinence, heme positive stool, hemorrhoids, irritable bowel syndrome, jaundice, light color stool, liver problems, rectal bleeding, and  rectal pain.    Current Medications (verified): 1)  Asacol 400 Mg  Tbec (Mesalamine) .Marland Kitchen.. 12 Tablets By Mouth Daily 2)  Prozac 20 Mg Caps (Fluoxetine Hcl) .... Once Daily 3)  Prednisone 10 Mg Tabs (Prednisone) .... 3 Tablet By Once Daily 4)  Bentyl 10 Mg Caps (Dicyclomine Hcl) .... Take 1 Capsule By Mouth Two Times A Day  As Needed For Cramps 5)  Klor-Con M20 20 Meq Cr-Tabs (Potassium Chloride Crys Cr) .... Take 2 Tablets By Mouth Once Daily X 1 Week Then Recheck 6)  Digoxin 0.25 Mg/ml Soln (Digoxin) .Marland Kitchen.. 1 Tablet By Mouth Once Daily 7)  Metoprolol Tartrate 50 Mg Tabs (Metoprolol Tartrate) .... One  Tablet By Mouth Once Daily 8)  Imuran 50 Mg Tabs (Azathioprine) .... Take 2 By Mouth Daily  Allergies (verified): 1)  ! Atenolol 2)  ! Keflex  Past History:  Past Medical History: Reviewed history from 09/17/2009 and no changes required. Current Problems:  Ulcerative colitis- Supraventricular tachycardia History of meningioma status post resection deficit complicating the above requiring hearing aid implant-further complicated by recurrent infections Barrett Henle disease Hoarseness Anxiety/depression  Tachycardia  Past Surgical History: Reviewed history from 09/17/2009 and no changes required. Brain surgery 10/08 hearing implant  Family History: Reviewed history from 02/26/2008 and no changes required. Family History of Heart Disease: Mother Maternal Grandfather Family History of Diabetes: Uncle No FH of Colon Cancer:  Social History: Reviewed history from 03/04/2009 and no changes required. Married, 1 girl Patient has never smoked.  Alcohol Use - no Patient does not get regular exercise.  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 Drug Use - no  Review of Systems  The patient denies allergy/sinus, anemia, anxiety-new, arthritis/joint pain, back pain, blood in urine, breast changes/lumps, change in vision, confusion, cough, coughing up blood, depression-new, fainting, fatigue, fever, headaches-new, hearing problems, heart murmur, heart rhythm changes, itching, menstrual pain, muscle pains/cramps, night sweats,  nosebleeds, pregnancy symptoms, shortness of breath, skin rash, sleeping problems, sore throat, swelling of feet/legs, swollen lymph glands, thirst - excessive , urination -  excessive , urination changes/pain, urine leakage, vision changes, and voice change.    Vital Signs:  Patient profile:   50 year old female Height:      59 inches Weight:      171.13 pounds Pulse rate:   60 / minute Pulse rhythm:   regular BP sitting:   112 / 68 Cuff size:   regular  Vitals Entered By: Christie Nottingham CMA Duncan Dull) (October 08, 2009 1:23 PM)  Physical Exam  General:  alert oriented in no distress Eyes:  nonicteric Neck:  Supple; no masses or thyromegaly. Lungs:  Clear throughout to auscultation. Heart:  Regular rate and rhythm; no murmurs, rubs,  or bruits. Abdomen:  soft nontender abdomen with normoactive bowel sounds. No distention Rectal:  nodes down Extremities:  No clubbing, cyanosis, edema or deformities noted. Skin:  Intact without significant lesions or rashes. Psych:  Alert and cooperative. Normal mood and affect.   Impression & Recommendations:  Problem # 1:  ULCERATIVE COLITIS (ICD-556.9) ulcerative colitis of the left colon with rectal involvement status post flexible sigmoidoscopy November 2010. Her symptoms side proved but still quite intense. We will need to stay on prednisone 30 mg daily for another month and continue mesalamine at 4.8 g a day and Imuran 100 mg a day. We are also adding Humira induction dose followed by maintenance dose. Patient will receive skin test today and will start whenever her in insurance approves we'll discuss indication some of Humira possible side effects and length of treatment which may be for  at least a year  Problem # 2:  SUPRAVENTRICULAR TACHYCARDIA (ICD-427.89) awaiting ablation by Dr. Graciela Husbands for SVT  Problem # 3:  Hx of MENINGIOMA S/P SURGERY (ICD-225.2) no active  Problem # 4:  COUGH, CHRONIC (ICD-786.2) no active  Problem # 5:  ANXIETY (ICD-300.00) increased anxiety on prednisone 30 mg a day. Xanax 0.25 mg available  Patient Instructions: 1)  Asacol 400 mg 12 tablets a day 2)  Sample of Lialda 1.2 gm  given 2 use in place soft bowel Asacol 3)  Imuran 100 mg daily 4)  TB skin test today to be red in 48 hours 5)  Begin Humira induction regimen managements or approves 6)  Continue prednisone 30 mg daily for next 4 weeks 7)  Copy sent to : Dr [name]  Appended Document: discuss biologicals/dn    Clinical Lists Changes  Orders: Added new Service order of TB Skin Test [phone]) - Signed Added new Service order of Admin 1st Vaccine (60454) - Signed Added new Test order of TLB-CBC Platelet - w/Differential (85025-CBCD) - Signed Added new Test order of TLB-Potassium (K+) (09811-B) - Signed Observations: Added new observation of TB PPDRESULT: negative (10/08/2009 14:02) Added new observation of PPD RESULT: < 5mm (10/08/2009 14:02) Added new observation of TB-PPD RDDTE: 10/11/2009 (10/08/2009 14:02) Added new observation of TB-PPD LOT#: [id number] (10/08/2009 14:02) Added new observation of TB-PPD EXP: 01/23/2012 (10/08/2009 14:02) Added new observation of TB-PPD BY: Hortense Ramal CMA (AAMA) (10/08/2009 14:02) Added new observation of TB-PPD RTE: ID (10/08/2009 14:02) Added new observation of TB-PPD DSE: 0.1 ml (10/08/2009 14:02) Added new observation of TB-PPD MFR: Sanofi Pasteur (10/08/2009 14:02) Added new observation of TB-PPD SITE: left forearm (10/08/2009 14:02) Added new observation of TB-PPD: PPD (10/08/2009 14:02)       Immunizations Administered:  PPD Skin Test:  Vaccine Type: PPD    Site: left forearm    Mfr: Sanofi Pasteur    Dose: 0.1 ml    Route: ID    Given by: Hortense Ramal CMA (AAMA)    Exp. Date: 01/23/2012    Lot #: [id number]  PPD Results    Date of reading: 10/11/2009    Results: < 5mm    Interpretation: negative

## 2010-09-29 NOTE — Progress Notes (Signed)
Summary: records  Phone Note Call from Patient Call back at Home Phone [phone]   Caller: Patient Reason for Call: Talk to Nurse Summary of Call: [facility] cardiology will be faxing records over today, she spoke with Butch Penny.... if you need to speak to Butch Penny call her [phone], pt request she be called when you receive records Initial call taken by: Darnell Level,  September 28, 2009 12:52 PM  Follow-up for Phone Call        s/w Ronalee Belts her husband and let him know that I did get the ECHO results from [place] but thats all.  Follow-up by: Barnett Abu, RN, BSN,  September 29, 2009 4:02 PM

## 2010-09-29 NOTE — Procedures (Signed)
Summary: FLEX SIG/COLITIS FLARE/PP

## 2010-09-29 NOTE — Procedures (Signed)
Summary: [facility]  [facility]   Imported By: Christie Nottingham CMA 03/05/2008 16:31:50  _____________________________________________________________________  External Attachment:    Type:   Image     Comment:   External Document

## 2010-09-29 NOTE — Miscellaneous (Signed)
Summary: Asacol refill  Clinical Lists Changes  Medications: Changed medication from ASACOL 400 MG  TBEC (MESALAMINE) take six tabs by mouth once daily to ASACOL 400 MG  TBEC (MESALAMINE) take as directed - Signed Rx of ASACOL 400 MG  TBEC (MESALAMINE) take as directed;  #300 x 11;  Signed;  Entered by: Christie Nottingham CMA;  Authorized by: Hart Carwin MD;  Method used: [facility]  [phone]*, [address] [place], [place]  [address], Ph: [phone] or [phone], Fax: [phone]    Prescriptions: ASACOL 400 MG  TBEC (MESALAMINE) take as directed  #300 x 11   Entered by:   Christie Nottingham CMA   Authorized by:   Hart Carwin MD   Signed by:   Christie Nottingham CMA on 05/26/2008   Method used:   [facility] to        [facility]  [phone]* (retail)       [address] [place], [place]  [address]       Ph: [phone] or [phone]       Fax: [phone]   RxID:   [id number]

## 2010-09-29 NOTE — Progress Notes (Signed)
Summary: TRIAGE-SEVERE UC FLARE  Phone Note Call from Patient Call back at Work Phone [phone] Call back at [phone]   Caller: Patient Call For: Lauren [name] Reason for Call: Talk to Nurse Summary of Call: flare up of colitis  taking asacol eating yogurt and cottage cheese and its not helping her has lost over ten pounds in about 2 - 3 wks  want to oknow what to do Initial call taken by: Tawni Levy,  June 24, 2008 8:45 AM  Follow-up for Phone Call        Pt. has been taking Keflex and Augmentin for an ear infection. She thinks it has caused a Colitis flare. Increased Asacol from 6 to 11 daily-2 weeks ago. Has had multiple watery stools and urgency for 2-3 weeks, some blood in stools, alot of mucus. Mid abd. pain began today. Wt. loss of 10 punds.  Pt. to see Gunnar Fusi ASAP this morning. Follow-up by: Laureen Ochs LPN,  June 24, 2008 9:33 AM

## 2010-09-29 NOTE — Assessment & Plan Note (Signed)
Summary: nurse visit  Nurse Visit   Vital Signs:  Patient profile:   50 year old female Pulse (ortho):   62 / minute Pulse rhythm:   regular BP sitting:   126 / 78  (left arm)   Allergies: 1)  ! Atenolol 2)  ! Keflex  Visit Type:  Nurse Visit Referring Provider:  Verdis Prime Primary Provider:  [name], MD  CC:  Pain at Cath site.  History of Present Illness: Pt had SVT Ablation on 10/23/09 and had some rebleeding in the right groin prior to hospital d/c. She has not had any pain since but has had two knots and bruising since. Today it began to ache at the site. Site was checked and all bruising looked old. NO swelling and not much tenderness upon palpitation. Dr. Ladona Ridgel looked at the site and told her that he thought it was a bruise or a pseudo aneurysm. He would like to get an [place] to r/o pseudo aneurysm. [place] scheduled for tomorrow at 3pm.     Patient Instructions: 1)  Your physician has requested that you have a lower or upper extremity arterial duplex.  This test is an ultrasound of the arteries in the legs or arms.  It looks at arterial blood flow in the legs and arms.  Allow one hour for Lower and Upper Arterial scans. There are no restrictions or special instructions.

## 2010-09-29 NOTE — Assessment & Plan Note (Signed)
Summary: SEVERE COLITIS FLARE          Lauren   History of Present Illness Visit Type: follow up Primary GI MD: Lauren Sar MD Primary Provider: Aretta [name], M.D. Chief Complaint: Severe colitis flare History of Present Illness:   [name] is worked in today for diarrhea and history of ulcerative colitis diagnosed in 2002. The patient had been doing well for the last few years on Asacol 2.4 gms daily until she recently took two rounds of antibiotics (Keflex and then Augmentin) and developed mucoid diarrhea, and abdominal cramping.  Symptoms developed on the fourth day of antibiotics at which time we increased her Asacol to eleven a day.  Despite the increase in Asacol the patient is not doing well. She has greater than 10 watery stools a day associated with tenesmus and cramping. No hematochezia other than an occasional small amount of hemorrhoidal bleeding. No fevers. No joint pains.  She has had some nausea.   GI Review of Systems    Reports abdominal pain.     Location of  Abdominal pain: lower abdomen.    Denies acid reflux, belching, bloating, chest pain, dysphagia with liquids, dysphagia with solids, heartburn, loss of appetite, nausea, vomiting, vomiting blood, weight loss, and  weight gain.      Reports diarrhea, hemorrhoids, and  rectal pain.        Prior Medications Reviewed Using: Patient Recall  Updated Prior Medication List: ASACOL 400 MG  TBEC (MESALAMINE) take as directed BISOPROLOL-HYDROCHLOROTHIAZIDE 5-6.25 MG  TABS (BISOPROLOL-HYDROCHLOROTHIAZIDE) one by mouth once daily  Current Allergies: ! ATENOLOL ! KEFLEX  Past Medical History:    Current Problems:     DEPRESSION, HX OF (ICD-V11.8)    ANXIETY (ICD-300.00)    HYPERTENSION (ICD-401.9)    SUPRAVENTRICULAR TACHYCARDIA (ICD-427.89)    Hx of MENINGIOMA (ICD-225.2)    DEVIATED SEPTUM (ICD-470)    GERD (ICD-530.81)    BLEEDING DISORDER (ICD-287.9)    ULCERATIVE COLITIS (ICD-556.9)    HOARSENESS  (ICD-784.49)    COUGH, CHRONIC (ICD-786.2)    Acute gastritis      Past Surgical History:    Reviewed history from 02/26/2008 and no changes required:       Brain surgery 10/08   Social History:    Patient has never smoked.     Alcohol Use - no    Patient does not get regular exercise.    Risk Factors:  Exercise:  no    Vital Signs:  Patient Profile:   50 Years Old Female Height:     59 inches Weight:      162.25 pounds BMI:     32.89 BSA:     1.69 Temp:     98.6 degrees F oral Pulse rate:   60 / minute Pulse rhythm:   regular BP sitting:   142 / 74  (left arm)  Vitals Entered By: Lowry Ram CMA (June 24, 2008 10:15 AM)                  Physical Exam  General:     Well developed, well nourished, no acute distress. Head:     Normocephalic and atraumatic. Eyes:     conjunctiva pink, no icterus.  Mouth:     No deformity or lesions, dentition normal. Neck:     Supple; no masses or thyromegaly. Lungs:     Clear throughout to auscultation. Heart:     Regular rate and rhythm; no murmurs, rubs,  or bruits. Abdomen:  Abdomen soft, nontender, nondistended. No obvious masses or hepatomegaly. No obvious hernias. Normal bowel sounds.  Msk:     Symmetrical with no gross deformities. Normal posture. Extremities:     No clubbing, cyanosis, edema or deformities noted. Neurologic:     Alert and  oriented x4;  grossly normal neurologically. Skin:     Intact without significant lesions or rashes. Cervical Nodes:     No significant cervical adenopathy. Psych:     Alert and cooperative. Normal mood and affect.    Impression & Recommendations:  Problem # 1:  DIARRHEA (ICD-787.91) Acute. Rule out C-difficile colitis with recent history of antibiotics. Rule out Ulcerative Colitis flare. Patient will have a limited flex sigmoidscopy today for further evaluation. Further studies / treatment pending results of flex sigmoidscopy. Orders: Flex with Sedation  (Flex w/Sed)   Problem # 2:  ULCERATIVE COLITIS (ICD-556.9) Followed by Dr. Juanda Chance. See #1. Orders: Flex with Sedation (Flex w/Sed)   Problem # 3:  BLEEDING DISORDER (ICD-287.9) Patient followed by hematology.      ]  Appended Document: Orders Update    Clinical Lists Changes  Orders: Added new Test order of T-Culture, C-Diff Toxin A/B [id number]) - Signed

## 2010-09-29 NOTE — Miscellaneous (Signed)
  Clinical Lists Changes 

## 2010-09-29 NOTE — Progress Notes (Signed)
  Phone Note Call from Patient   Caller: Patient Summary of Call: S/W Pt who wanted the results of her Arterial Doppler study to r/o aneurysm. There is no official report in the computer. I called [facility] and she said Dr. Eden Emms read it and it was normal. No Aneurysm. Info passed on to the pt. She would like a copy mailed to her. I told her I will place it in the mail as soon as I get the report. She was thankful.  Initial call taken by: Duncan Dull, RN, BSN,  November 04, 2009 8:40 AM

## 2010-09-29 NOTE — Assessment & Plan Note (Signed)
Summary: 8 week colitis f/u   History of Present Illness Visit Type: Follow-up Visit Primary GI MD: [name] MD Primary Provider: [name], MD Requesting Provider: n/a Chief Complaint: colitis, Somediarrhea this past weekend, also some frequency History of Present Illness:   This is a 50 year old white female with ulcerative colitis of 10 years duration who is chronically on a prednisone taper. She has been on maximum medical therapy using Imuran, Remicade, prednisone 20 mg a day and mesalamine 4.8 g a day. She has improved but is still seeing blood in her stools and is having 3-4 bowel movements a day. These are usually rather soft and runny. Over all, she has been improved. The other issues include von Willebrand's disease, SVT, and a recent tick bite which occurred 3 days ago on the upper right thigh. There has been several maculopapular lesions to develop on her right  leg and abdomen.   GI Review of Systems      Denies abdominal pain, acid reflux, belching, bloating, chest pain, dysphagia with liquids, dysphagia with solids, heartburn, loss of appetite, nausea, vomiting, vomiting blood, weight loss, and  weight gain.      Reports diarrhea.     Denies anal fissure, black tarry stools, change in bowel habit, constipation, diverticulosis, fecal incontinence, heme positive stool, hemorrhoids, irritable bowel syndrome, jaundice, light color stool, liver problems, rectal bleeding, and  rectal pain.    Current Medications (verified): 1)  Lialda 1.2 Gm Tbec (Mesalamine) .... Four Tablets By Mouth Daily 2)  Prozac 20 Mg Caps (Fluoxetine Hcl) .... Once Daily 3)  Prednisone 20 Mg Tabs (Prednisone) .... Take One Tablet Once Daily 4)  Bentyl 10 Mg Caps (Dicyclomine Hcl) .... Take 1 Capsule By Mouth Two Times A Day As Needed For Cramps 5)  Klor-Con M20 20 Meq Cr-Tabs (Potassium Chloride Crys Cr) .... Take 2 Tablets By Mouth Once Daily X 1 Week Then Recheck 6)  Metoprolol Succinate 50 Mg  Xr24h-Tab (Metoprolol Succinate) .... 1/2 Tablet Once Daily 7)  Imuran 50 Mg Tabs (Azathioprine) .... Take 2 By Mouth Daily 8)  Alprazolam 0.5 Mg Tabs (Alprazolam) .... Take 1 Tablet By Mouth Before Bedtime While Taking Prednisone 9)  Remicade 100 Mg Solr (Infliximab) .... Monthly  Allergies (verified): 1)  ! Atenolol 2)  ! Keflex  Past History:  Past Medical History: Reviewed history from 09/17/2009 and no changes required. Current Problems:  Ulcerative colitis- Supraventricular tachycardia History of meningioma status post resection deficit complicating the above requiring hearing aid implant-further complicated by recurrent infections Barrett Henle disease Hoarseness Anxiety/depression  Tachycardia  Past Surgical History: Reviewed history from 09/17/2009 and no changes required. Brain surgery 10/08 hearing implant  Family History: Reviewed history from 02/26/2008 and no changes required. Family History of Heart Disease: Mother Maternal Grandfather Family History of Diabetes: Uncle No FH of Colon Cancer:  Social History: Reviewed history from 03/04/2009 and no changes required. Married, 1 girl Patient has never smoked.  Alcohol Use - no Patient does not get regular exercise.  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  The patient denies anemia, anxiety-new, arthritis/joint pain, back pain, blood in urine, breast changes/lumps, change in vision, confusion, cough, coughing up blood, depression-new, fainting, fatigue, fever, headaches-new, hearing problems, heart murmur, heart rhythm changes, itching, menstrual pain, muscle pains/cramps, night sweats, nosebleeds, pregnancy symptoms, shortness of breath, skin rash, sore throat, swelling of feet/legs,  swollen lymph glands, thirst - excessive, urination - excessive, urination changes/pain, urine leakage, vision  changes, and voice change.         Pertinent positive and negative review of systems were noted in the above HPI. All other ROS was otherwise negative.   Vital Signs:  Patient profile:   50 year old female Height:      59 inches Weight:      167.13 pounds BMI:     33.88 Pulse rate:   60 / minute Pulse rhythm:   regular BP sitting:   120 / 70  (left arm) Cuff size:   regular  Vitals Entered By: June McMurray CMA Duncan Dull) (January 31, 2010 4:27 PM)  Physical Exam  General:  mildly cushingoid. Eyes:  nonicteric. Neck:  no adenopathy. Lungs:  Clear throughout to auscultation. Heart:  Regular rate and rhythm; no murmurs, rubs,  or bruits. Abdomen:  soft with minimal tenderness in left lower quadrant. Normoactive bowel sounds. No distention, 2 maculopapular lesions in the abdomen; quite nonspecific. Rectal:  soft Hemoccult positive stool with perianal erythema. Extremities:  No clubbing, cyanosis, edema or deformities noted. Skin:  Intact without significant lesions or rashes. Psych:  Alert and cooperative. Normal mood and affect.   Impression & Recommendations:  Problem # 1:  VON WILLENBRANDS DISORDER (ICD-287.9) Patient is followed by Dr Dalene Carrow. She recently had an appointment with her.  Problem # 2:  ULCERATIVE COLITIS (ICD-556.9) Patient's ulcerative colitis is slowly going into remission. We will continue her on Remicade infusions. Her next one is later this week. She is also to continue Imuran 100 mg daily and continue the same prednisone 20 mg daily dosing until her next appointment 8 weeks from now. She is also to continue Lialda 1.2 gm daily.  Problem # 3:  TICK BITE (ICD-E906.4) Patient had a bite which she noticed 3 days ago on the upper right thigh. She will be evaluated at [facility] tonight for prophylactic therapy in a setting of immunosuppression.  Patient Instructions: 1)  Please have your labwork completed on the following dates: 2)                               02/04/10-CBC 3)                              02/11/10-CBC, Potassium 4)                              02/18/10-CBC 5)                               02/25/10-CBC, Potassium 6)  Then, you will need monthly blood tests thereafter. 7)  We have given you samples of Analpram to apply to the rectum as needed. 8)  Remicade infusion is scheduled for Friday 02/04/10. 9)  Office Visit 8 Weeks. 10)  Copy sent to : [facility], Dr Dalene Carrow 11)  The medication list was reviewed and reconciled.  All changed / newly prescribed medications were explained.  A complete medication list was provided to the patient / caregiver.  Appended Document: Orders Update    Clinical Lists Changes  Orders: Added new Test order of TLB-CBC Platelet - w/Differential (85025-CBCD) - Signed

## 2010-09-29 NOTE — Medication Information (Signed)
Summary: Humira DENIED/[facility]  Humira DENIED/[facility]   Imported By: Sherian Rein 01/19/2010 08:34:27  _____________________________________________________________________  External Attachment:    Type:   Image     Comment:   External Document

## 2010-09-29 NOTE — Progress Notes (Signed)
Summary: [facility] Visit-[facility]  Office Visit-[facility]   Imported By: Hortense Ramal CMA 07/10/2008 11:48:13  _____________________________________________________________________  External Attachment:    Type:   Image     Comment:   External Document

## 2010-09-29 NOTE — Letter (Signed)
Summary: Office Visit/[facility]  Office Visit/[facility]   Imported By: Esmeralda Links D'jimraou 08/18/2008 16:36:40  _____________________________________________________________________  External Attachment:    Type:   Image     Comment:   External Document

## 2010-09-29 NOTE — Progress Notes (Signed)
Summary: set up surgery   Phone Note Call from Patient Call back at Home Phone [phone] Call back at Work Phone [phone] Call back at [phone]   Caller: Patient Reason for Call: Talk to Nurse Details for Reason: was seen on 1/21 - set up surgery.  Initial call taken by: Neil Crouch,  September 23, 2009 1:56 PM  Follow-up for Phone Call        Called patient and left message on machine both cell and home. Barnett Abu, RN, BSN  September 23, 2009 2:35 P  pt returning call.. please call back @ [phone] , Darnell Level  September 23, 2009 3:52 PM   Additional Follow-up for Phone Call Additional follow up Details #1::        Pt will call me to set up when she gets all of her results together.  Additional Follow-up by: Barnett Abu, RN, BSN,  September 28, 2009 12:20 PM

## 2010-09-29 NOTE — Medication Information (Signed)
Summary: Glucocorticoid / [facility]  Glucocorticoid / [facility]   Imported By: Lennie Odor 05/27/2010 12:20:18  _____________________________________________________________________  External Attachment:    Type:   Image     Comment:   External Document

## 2010-09-29 NOTE — Assessment & Plan Note (Signed)
Summary: 66M FU/YF   History of Present Illness Visit Type: Follow-up Visit Primary GI MD: [name] MD Primary Provider: [name], MD Chief Complaint: follow up ulcerative colitis, pt had a few cramps after lunch today, but otherwise pt is doing well History of Present Illness:   This is a 50 year old white female with ulcerative colitis who until recently was on a prednisone taper. Her last appointment was on 12/18/08. She had an episode of pseudomembranous colitis in October 2009. She is doing very well, having formed stools. She denies any rectal bleeding or crampy abdominal pain. The urgency has decreased significantly. Patient remains on Asacol 3.6 grams daily.  She comes today for a routine 3 month follow up.  She denies any current gastrointestinal symptoms. Her last dose of prednisone was about 6 weeks ago. She saw Dr. Danielle Dess recently for a followup regarding a past meningioma. He plans to repeat an MRI in 6 months which would be in January 2011. Her cough has decreased significantly. She has not been on any PPIs.   GI Review of Systems      Denies abdominal pain, acid reflux, belching, bloating, chest pain, dysphagia with liquids, dysphagia with solids, heartburn, loss of appetite, nausea, vomiting, vomiting blood, weight loss, and  weight gain.        Denies anal fissure, black tarry stools, change in bowel habit, constipation, diarrhea, diverticulosis, fecal incontinence, heme positive stool, hemorrhoids, irritable bowel syndrome, jaundice, light color stool, liver problems, rectal bleeding, and  rectal pain.    Allergies (verified): 1)  ! Atenolol 2)  ! Keflex  Past History:  Past Medical History: Reviewed history from 06/24/2008 and no changes required. Current Problems:  DEPRESSION, HX OF (ICD-V11.8) ANXIETY (ICD-300.00) HYPERTENSION (ICD-401.9) SUPRAVENTRICULAR TACHYCARDIA (ICD-427.89) Hx of MENINGIOMA (ICD-225.2) DEVIATED SEPTUM (ICD-470) GERD (ICD-530.81)  BLEEDING DISORDER (ICD-287.9) ULCERATIVE COLITIS (ICD-556.9) HOARSENESS (ICD-784.49) COUGH, CHRONIC (ICD-786.2) Acute gastritis  Past Surgical History: Reviewed history from 02/26/2008 and no changes required. Brain surgery 10/08  Family History: Reviewed history from 02/26/2008 and no changes required. Family History of Heart Disease: Mother Maternal Grandfather Family History of Diabetes: Uncle No FH of Colon Cancer:  Social History: Married, 1 girl Patient has never smoked.  Alcohol Use - no Patient does not get regular exercise.  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 Drug Use - no  Review of Systems  The patient denies allergy/sinus, anemia, anxiety-new, arthritis/joint pain, back pain, blood in urine, breast changes/lumps, change in vision, confusion, cough, coughing up blood, depression-new, fainting, fatigue, fever, headaches-new, hearing problems, heart murmur, heart rhythm changes, itching, menstrual pain, muscle pains/cramps, night sweats, nosebleeds, pregnancy symptoms, shortness of breath, skin rash, sleeping problems, sore throat, swelling of feet/legs, swollen lymph glands, thirst - excessive , urination - excessive , urination changes/pain, urine leakage, vision changes, and voice change.         Pertinent positive and negative review of systems were noted in the above HPI. All other ROS was otherwise negative.   Vital Signs:  Patient profile:   50 year old female Height:      59 inches Weight:      178 pounds BMI:     36.08 BSA:     1.76 Pulse rate:   64 / minute Pulse rhythm:   regular BP sitting:   110 / 66  (left arm) Cuff size:   regular  Vitals Entered By: Francee Piccolo CMA (March 04, 2009 3:43 PM)  Physical Exam  General:  Well developed, well nourished,  no acute distress. Eyes:  PERRLA, no icterus. Neck:  Supple; no masses or thyromegaly. Lungs:  Clear throughout to auscultation. Heart:  Regular rate and rhythm; no murmurs,  rubs,  or bruits. Abdomen:  soft abdomen without focal tenderness. Normoactive bowel sounds. No distention. No fullness. Liver edge at costal margin. Extremities:  No clubbing, cyanosis, edema or deformities noted. Skin:  Intact without significant lesions or rashes. Psych:  Alert and cooperative. Normal mood and affect.   Impression & Recommendations:  Problem # 1:  GERD (ICD-530.81) Patient not on any medications. She has been advised to take antacids p.r.n.  Problem # 2:  ULCERATIVE COLITIS (ICD-556.9) Patient's ulcerative colitis is in remission. She is to reduce Asacol  to 2.4 g a day. Orders: TLB-CBC Platelet - w/Differential (85025-CBCD)  Patient Instructions: 1)  Asacol 400 mg 6 tablets a day. (samples of Asacol HD given. she will take 3 of them a day) 2)  CBC today 3)  Office visit 6 months 4)  If her ulcerative colitis flares up, she will go up on the Asacol to 9 tablets a day. 5)  Copy sent to :Dr [name]

## 2010-09-29 NOTE — Assessment & Plan Note (Signed)
Summary: eph/jss   Visit Type:  EPH Referring Provider:  Verdis [name] Primary Provider:  [name], MD  CC:  pt had cardiac abaltion for SVT 10/22/09...pt states this "Sunday and Monday she had some chest heaviness...denies any edema or sob.  History of Present Illness: This is a 50 year old white female patient, who underwent radiofrequency ablation for SVT by Dr. Steven Klein, October 22, 2009. The patient does have history of von Willebrand's disease and underwent pre-procedural desmopressin acetate directed by hematology prior to procedure. The patient had some groin pain and swelling. Post procedure and underwent a lower arterial Dopplers, which were normal. She denies further palpitations or rapid heartbeats. She did have some chest tightness over the weekend that was relieved by putting pressure against her chest.  Current Medications (verified): 1)  Asacol 400 Mg  Tbec (Mesalamine) .... 12 Tablets By Mouth Daily 2)  Prozac 20 Mg Caps (Fluoxetine Hcl) .... Once Daily 3)  Prednisone 10 Mg Tabs (Prednisone) .... 3 Tab Once Daily 4)  Bentyl 10 Mg Caps (Dicyclomine Hcl) .... Take 1 Capsule By Mouth Two Times A Day As Needed For Cramps 5)  Klor-Con M20 20 Meq Cr-Tabs (Potassium Chloride Crys Cr) .... Take 2 Tablets By Mouth Once Daily X 1 Week Then Recheck 6)  Metoprolol Tartrate 50 Mg Tabs (Metoprolol Tartrate) .... 1/2 Tab Once Daily 7)  Imuran 50 Mg Tabs (Azathioprine) .... Take 2 By Mouth Daily 8)  Alprazolam 0.5 Mg Tabs (Alprazolam) .... 1 Tab At Bedtime  Allergies: 1)  ! Atenolol 2)  ! Keflex  Past History:  Past Medical History: Last updated: 09/17/2009 Current Problems:  Ulcerative colitis- Supraventricular tachycardia History of meningioma status post resection deficit complicating the above requiring hearing aid implant-further complicated by recurrent infections Van Willebrand's disease Hoarseness Anxiety/depression  Tachycardia  Review of Systems       see  history of present illness  Vital Signs:  Patient profile:   50 year old female Height:      59 inches Weight:      170 pounds BMI:     34" .46 Pulse rate:   65 / minute Pulse rhythm:   irregular BP sitting:   122 / 80  (left arm) Cuff size:   regular  Vitals Entered By: Danielle Rankin, CMA (November 10, 2009 1:03 PM)  Physical Exam  General:   Well-nournished, in no acute distress. Neck: No JVD, HJR, Bruit, or thyroid enlargement Lungs: No tachypnea, clear without wheezing, rales, or rhonchi Cardiovascular: RRR, PMI not displaced, heart sounds normal, no murmurs, gallops, bruit, thrill, or heave. Abdomen: BS normal. Soft without organomegaly, masses, lesions or tenderness. Extremities: Right and left groin stable without hematoma or hemorrhage,without cyanosis, clubbing or edema. Good distal pulses bilateral SKin: Warm, no lesions or rashes  Musculoskeletal: No deformities Neuro: no focal signs    EKG  Procedure date:  11/10/2009  Findings:      normal sinus rhythm, right axis, no acute change  Impression & Recommendations:  Problem # 1:  SUPRAVENTRICULAR TACHYCARDIA (ICD-427.89) Patient underwent radiofrequency ablation for SVT, October 22, 2009. She is doing well. She did have right groin pain, but Dopplers were normal and exam is normal. This is resolved. Her updated medication list for this problem includes:    Metoprolol Tartrate 50 Mg Tabs (Metoprolol tartrate) .Marland Kitchen... 1/2 tab once daily  Orders: EKG w/ Interpretation (93000)  Problem # 2:  HYPERTENSION (ICD-401.9) Blood pressure stable Her updated medication list for this problem includes:  Metoprolol Tartrate 50 Mg Tabs (Metoprolol tartrate) .Marland Kitchen... 1/2 tab once daily  Orders: EKG w/ Interpretation (93000)  Patient Instructions: 1)  Your physician recommends that you schedule a follow-up appointment in: 1-2 months with Dr. Graciela Husbands 2)  Your physician recommends that you continue on your current medications as  directed. Please refer to the Current Medication list given to you today.

## 2010-09-29 NOTE — Progress Notes (Signed)
Summary: [place]  Phone Note Call from Patient Call back at [phone]   Caller: Patient Call For: Dr. Juanda Chance Reason for Call: Talk to Nurse Summary of Call: Pt is having continued problems with blood in stool, needs to speak with nurse Initial call taken by: [place] Johnson,  September 19, 2010 4:02 PM  Follow-up for Phone Call        Patient calling to report that since last Friday she has had nausea and had an acid taste in her mouth. Vomited x one yesterday that was yellow color, no food particles. Feels bloated and achy also. Denies fever. She thought at first this was a "bug." Continues to have diarrhea with bright red blood in "mushy stool" that sometimes has mucous in it too. Per patient the diarrhea seems to come in "spells." She may go 10 times in 30 minutes then it goes away. States she had 4-5 diarrhea stools today and this is a "good day." States she is taking Prednisone 9 mg daily, Lialda QID, Dicyclomine two times a day as needed. Please, advise. Follow-up by: Jesse Fall RN,  September 19, 2010 4:20 PM  Additional Follow-up for Phone Call Additional follow up Details #1::        spoke with pt: nausea, will take Prilosec 20 mg once daily, also diarrhea, cramps, blood in stools while on prednisone  8 mg/day. She will go up to 15 mg/dayx3 days, then 10 mg by mouth once daily. Additional Follow-up by: Hart Carwin MD,  September 19, 2010 5:28 PM

## 2010-09-29 NOTE — Procedures (Signed)
Summary: EGD   EGD  Procedure date:  05/06/2008  Findings:      Location: [facility]    Patient Name: Lauren [name], Lauren h. MRN: [id number] Procedure Procedures: Panendoscopy (EGD) CPT: 43235.    with biopsy(s)/brushing(s). CPT: D1846139.    with placement of Bravo probe for  48 hrs pH monitoring, pt has been off PPI's for several days Personnel: Endoscopist: Gilbert Narain L. Juanda Chance, MD.  Exam Location: Exam performed in Endoscopy Suite.  Patient Consent: Procedure, Alternatives, Risks and Benefits discussed, consent obtained, from patient. Consent was obtained by the RN.  Indications Symptoms: Pulmonary symptoms, including:  Cough. Reflux symptoms  History  Current Medications: Patient is not currently taking Coumadin.  Comments: Patient history reviewed/updated, physical exam performed prior to initiation of sedation?yes Pt has a partial Factor VIII deficiency, has used nasal DDAVP 1 hour prior to the peocedure Pre-Exam Physical: Performed May 06, 2008  Entire physical exam was normal.  Comments: Pt. history reviewed/updated, physical exam performed prior to initiation of sedation?yes Exam Exam Info: Maximum depth of insertion Duodenum, intended Duodenum. Vocal cords visualized. Gastric retroflexion performed. Images taken. ASA Classification: II. Tolerance: good.  Sedation Meds: Patient assessed and found to be appropriate for moderate (conscious) sedation. Fentanyl 100 mcg. given IV. Versed 9 mg. given IV. Cetacaine Spray 2 sprays given aerosolized.  Monitoring: BP and pulse monitoring done. Oximetry used. Supplemental O2 given  Findings OTHER FINDING: Placement of Bravo probe at 30 cm, on reendoscopy the capsule was actually at 26 cm in Proximal Esophagus.  - HIATAL HERNIA: Diaphragm 36 cm from mouth. Z-line/GE Junction 35 cm from mouth. 1 cms. in length. sliding Hernia. ICD9: Hernia, Hiatal: 553.3. - MUCOSAL ABNORMALITY: Cardia. Erythematous mucosa.  Biopsy/Mucosal Abn taken. ICD9: Gastritis, Acute: 535.00. Comment: 2 small poypoid folds at the gastric side of the g-e junction, ? inflammatory due to reflux?.  - Normal: Duodenal Bulb to Duodenal 2nd Portion. Comments: liquidified food in the lumen.  DIAGNOSTIC TEST: from Body. RUT done, results pending  - FOREIGN BODY / RETAINED FOOD: Retained food, moderate amount of solid food retained in the stomach, found in Fundus. ICD9: Foreign Body, GI NOS: 938.   Assessment Abnormal examination, see findings above.  Diagnoses: 535.00: Gastritis, Acute.  938: Foreign Body, GI NOS.  553.3: Hernia, Hiatal.   Comments: retained food in the stomach, normal gastric and duodenal outlet, r/o gastroparesis, pt is not on any medications which would affect her gastric emptying,  2 inflammatory polypoid folds at the g-e junction, s/p biopsies Events  Unplanned Intervention: No unplanned interventions were required.  Unplanned Events: There were no complications. Plans Medication(s): Await pathology.  Comments: to complete the 48 hr pH probe without PPI medications, pt to take her DDAVP nasal spray every 12 hours x 48 hrs to minimize the possibility of bleeding from the suction site,  pt to decrease the size of her evening meals to prevent nocturnal food retention, eventually she may require a Gastric emptying scan to assess her gastric emtying, consider Reglan at bedtime to minimize the reflux at nite Disposition: After procedure patient sent to recovery.    cc: [name], MD  This report was created from the original endoscopy report, which was reviewed and signed by the above listed endoscopist.

## 2010-09-29 NOTE — Procedures (Signed)
Summary: Gastroenterology-Dr Melchor Amour Office Note  Gastroenterology-Dr Virginia Rochester Office Note   Imported By: Christie Nottingham CMA 03/05/2008 16:29:58  _____________________________________________________________________  External Attachment:    Type:   Image     Comment:   External Document

## 2010-09-29 NOTE — Assessment & Plan Note (Signed)
Summary: b12 injection 1.Marland Kitchen.(need another b12 in 1 week...see 04/04/10 em...  Nurse Visit   Allergies: 1)  ! Atenolol 2)  ! Keflex   Medication Administration  Injection # 1:    Medication: Vit B12 1000 mcg    Diagnosis: VITAMIN B12 DEFICIENCY (ICD-266.2)    Route: IM    Site: L deltoid    Exp Date: 4/13    Lot #: [id number]    Mfr: Escalon    Patient tolerated injection without complications    Given by: Madlyn Frankel CMA ([place]) (April 05, 2010 4:51 PM)  Orders Added: 1)  Vit B12 1000 mcg [B8776]

## 2010-09-29 NOTE — Procedures (Signed)
Summary: [facility] Note  [facility] Note   Imported By: Christie Nottingham CMA 03/05/2008 16:31:12  _____________________________________________________________________  External Attachment:    Type:   Image     Comment:   External Document

## 2010-09-29 NOTE — Progress Notes (Signed)
Summary: TRIAGE  Phone Note Call from Patient Call back at Work Phone [phone] Call back at cell#[phone]   Caller: Patient Call For: DR.Chere Babson Summary of Call: Pt. saw her PCP for a tick bite and was started on Doxycycline. She has a Remicade infusion scheduled for this friday. Can she keep the appt. or reschedule? Initial call taken by: Laureen Ochs LPN,  February 02, 980 11:41 AM  Follow-up for Phone Call        she ought to keep it unless she becomes ill with fever between now and the scheduled infusion. Follow-up by: Hart Carwin MD,  February 01, 2010 1:02 PM  Additional Follow-up for Phone Call Additional follow up Details #1::        Message left for pt. with above MD instructions. Pt. instructed to call back as needed.  Additional Follow-up by: Laureen Ochs LPN,  February 01, 1913 1:13 PM

## 2010-09-29 NOTE — Letter (Signed)
Summary: Patient Notice- Colon Biospy Results  [facility]  [address] [place], [place] [address]   Phone: [phone]  Fax: [phone]        July 29, 2009 MRN: [id number]    [facility] [address] [place], [place]  [address]    Dear Ms. Reznik,  I am pleased to inform you that the biopsies taken during your recent colonoscopy did not show any evidence of cancer upon pathologic examination.The colon biopsies show severe inflammation due to ulcerative colitis.  Additional information/recommendations:  __No further action is needed at this time.  Please follow-up with      your primary care physician for your other healthcare needs. x __Please call [phone] to schedule a return visit to review      your condition.  v__Continue with the treatment plan as outlined on the day of your      exam.    Please call us if you are having persistent problems or have questions about your condition that have not been fully answered at this time.  Sincerely,  Hart Carwin MD   This letter has been electronically signed by your physician.  Appended Document: Patient Notice- Colon Biospy Results Letter mailed 12.03.10.

## 2010-09-29 NOTE — Op Note (Signed)
[name] Monitoring                             [name]                            GASTROENTEROLOGY OFFICE NOTE      Lauren, [name]                       MRN:          [id number]   DATE:05/06/2008                            DOB:          October 28, 1960         PROCEDURE:  Bravo pH monitoring study.      INDICATION:  This 50 year old white female has complained of cough for   at least several years' duration.  She has been treated for   gastroesophageal reflux with only partial response.  Her upper endoscopy   was essentially negative except for inflammatory polyps in the proximal   stomach distal from the GE junction and for retain gastric content.      This study was done while the patient was off her proton pump inhibitors   and suppressing agents.  The Bravo probe was placed endoscopically at 30   cm from the incisors.  After the endoscopic placement  the actual   location level  was measured at 26 cm from the incisors, which was about   10 cm above her lower esophageal sphincter.      RESULTS:  This was a 48-hour study.  The acid reflux analysis on the day   #1 showed no reflux episodes.  This included upright, supine, as well as   postprandial occurrence.  There was no heartburn or chest pain.  The   total DeMeester score was 0.3, normal less than 14.72.      On the second day of analysis, the patient again had no reflux episodes.   There were no reflux in upright or supine position, and no reflux   postprandially.  Total DeMeester score was 0.3, which was normal.      Her symptom analysis indicated that the patient has numerous coughing   episodeds none of them correlated with acid reflux.      IMPRESSION:  This is a negative intraesophageal pH monitoring study   showing a good wave form, but no recorded reflux.  The patient continued   to have cough despite of no registered reflux episodes.            Hedwig Morton. Juanda Chance, MD   Electronically Signed         DMB/MedQ  DD: 05/14/2008  DT: 05/15/2008  Job #: [id number]

## 2010-09-29 NOTE — Progress Notes (Signed)
Summary: Questions about prednisone  Phone Note Outgoing Call Call back at [facility] Phone [phone] Call back at cell # [phone]   Call placed by: Christie Nottingham CMA Duncan Dull),  September 08, 2010 3:29 PM Call placed to: Patient Summary of Call: Pt states she has several questions regarding her prednisone prescription and would like doctor to call her when she gets a chance. Initial call taken by: Christie Nottingham CMA Duncan Dull),  September 08, 2010 3:30 PM  Follow-up for Phone Call        Patient calling to discuss decreasing Prednisone dose. She has been decreasing 1 mg every 3 weeks and is down to 8 mg/day.She has been on 8 mg for 3 weeks and is due to decrease to 7 mg tomorrow. She wanted to report that she is having some bleeding,mucous with blood on it and spasms. When  she has spasms she goes to the bathroom 10/12 times/hour then is okay. She has had a few accidents due to urgency. Sometimes stools are formed or loose but not diarrhea.  She reports that before decreasing the Prednisone to 8 mg, she had not seen any bleeding. Should she decrease to 7 mg, remain on 8 mg or increase Prednisone? Please, advise. Follow-up by: Jesse Fall RN,  September 08, 2010 3:58 PM  Additional Follow-up for Phone Call Additional follow up Details #1::        please incerease Prednisone to 9mg /day, which is  a step back. I will call her today. Additional Follow-up by: Hart Carwin MD,  September 08, 2010 10:03 PM    Additional Follow-up for Phone Call Additional follow up Details #2::    Patient notified of Dr. Regino Schultze recommendations. Follow-up by: Jesse Fall RN,  September 09, 2010 9:01 AM

## 2010-09-29 NOTE — Progress Notes (Signed)
Summary: Wants to change Monday's procedure from Sigmoid to colon  Phone Note Call from Patient Call back at Home Phone [phone]   Call For: DR Trimaine Maser Reason for Call: Talk to Nurse Summary of Call: Scheduled for Sigmoid but her insurance will only pay 90% on that. Will pay %100 for a Colon. would Dr consider doing colon instead! Initial call taken by: Leanor Kail [facility],  July 23, 2009 3:26 PM  Follow-up for Phone Call        Pt. told she would need different prep for colon and Ii offered to cx. sig. and discuss with Dr.Xadrian Craighead when she returns on Monday,but pt. is not off any other days next wk. so she decided to keep sig. appt. for Monday. Follow-up by: Teryl Lucy RN,  July 23, 2009 3:53 PM

## 2010-09-29 NOTE — Assessment & Plan Note (Signed)
Summary: b12 level, next b12 already scheduled....  Nurse Visit   Allergies: 1)  ! Atenolol 2)  ! Keflex  Medication Administration  Injection # 1:    Medication: Vit B12 1000 mcg    Diagnosis: VITAMIN B12 DEFICIENCY (ICD-266.2)    Route: IM    Site: R deltoid    Exp Date: 02/2012    Lot #: [id number]    Mfr: American Regent    Patient tolerated injection without complications    Given by: Merri Ray CMA Duncan Dull) (May 27, 2010 2:48 PM)  Orders Added: 1)  Vit B12 1000 mcg [J3420]

## 2010-09-29 NOTE — Procedures (Signed)
Summary: Gastroenterology-Dr Melchor Amour Office Note  Gastroenterology-Dr Virginia Rochester Office Note   Imported By: Christie Nottingham CMA 03/05/2008 16:30:41  _____________________________________________________________________  External Attachment:    Type:   Image     Comment:   External Document

## 2010-09-29 NOTE — Assessment & Plan Note (Signed)
Summary: b12 injection ...already scheduled for next b12  Nurse Visit   Allergies: 1)  ! Atenolol 2)  ! Keflex  Medication Administration  Injection # 1:    Medication: Vit B12 1000 mcg    Diagnosis: VITAMIN B12 DEFICIENCY (ICD-266.2)    Route: IM    Site: L deltoid    Exp Date: 6/13    Lot #: [id number]    Mfr: American Regent    Patient tolerated injection without complications    Given by: Lamona Curl CMA (AAMA) (April 12, 2010 4:20 PM)  Orders Added: 1)  Vit B12 1000 mcg [J3420]

## 2010-09-29 NOTE — Miscellaneous (Signed)
  Clinical Lists Changes  Observations: Added new observation of DOPPLER LEG: Patent arteries and veins in the right groin. without pseudoaneurysm or AV fistula formation, and no evidence of obstruction. (11/02/2009 11:41)      Venous Doppler  Procedure date:  11/02/2009  Findings:      Patent arteries and veins in the right groin. without pseudoaneurysm or AV fistula formation, and no evidence of obstruction.

## 2010-09-29 NOTE — Miscellaneous (Signed)
Summary: Waiver of [facility]   Imported By: Esmeralda Links D'jimraou 04/27/2008 10:37:48  _____________________________________________________________________  External Attachment:    Type:   Image     Comment:   External Document

## 2010-09-30 NOTE — Procedures (Signed)
Summary: Instructions for Procedure/[facility] (Outpt)  Instructions for Procedure/[facility] (Outpt)   Imported By: Esmeralda Links D'jimraou 04/27/2008 10:36:43  _____________________________________________________________________  External Attachment:    Type:   Image     Comment:   External Document

## 2010-10-05 NOTE — Assessment & Plan Note (Signed)
Summary: nausea, vomiting, abd pain/Lauren [name]   History of Present Illness Primary GI MD: [name] MD Primary Provider: [name], MD Requesting Provider: n/a Chief Complaint: Starting last Friday pt had really bad intermittant abd cramping, chills, vomiting usually twice a day with no appetitie. Pt has abd bloating. Pt states when she does vomit, her food is usually solid and not digested. Pt states her bowels are doing better but with less frequent blood in her stools. History of Present Illness:   This is a 50 year old female with ulcerative colitis of at least 10 years duration. She is on maximum medical therapy including Remicade and immunomodulators including Imuran 100 mg daily. She is on a prednisone taper but had to increase her prednisone to 15 mg a day because of breakthrough rectal bleeding. She is now down to 10 mg daily and is feeling better. Concomitantly with the colitis, she has also developed acute nausea and vomiting without abdominal pain. An upper abdominal ultrasound within the last 2 years was negative. An endoscopy 2 years ago showed findings of retained food suggestive of gastroparesis. She denies early satiety. She has taken Prilosec 20 mg daily without much improvement of her nausea. There has been no fever. She will be due for her next Remicade infusion in 2 weeks.   GI Review of Systems    Reports abdominal pain, loss of appetite, nausea, and  vomiting.     Location of  Abdominal pain: abd cramping.    Denies acid reflux, belching, chest pain, dysphagia with liquids, dysphagia with solids, heartburn, vomiting blood, weight loss, and  weight gain.      Reports rectal bleeding.     Denies anal fissure, black tarry stools, change in bowel habit, constipation, diarrhea, diverticulosis, fecal incontinence, heme positive stool, hemorrhoids, irritable bowel syndrome, jaundice, light color stool, liver problems, and  rectal pain.    Current Medications (verified): 1)  Lialda  1.2 Gm Tbec (Mesalamine) .... Four Tablets By Mouth Daily 2)  Prozac 20 Mg Caps (Fluoxetine Hcl) .... Once Daily 3)  Bentyl 10 Mg Caps (Dicyclomine Hcl) .... Take 1 Capsule By Mouth Two Times A Day As Needed For Cramps 4)  Klor-Con M20 20 Meq Cr-Tabs (Potassium Chloride Crys Cr) .... Take As Directed 5)  Metoprolol Succinate 50 Mg Xr24h-Tab (Metoprolol Succinate) .Marland Kitchen.. 1 Tablet Once Daily 6)  Imuran 50 Mg Tabs (Azathioprine) .... Take 2 Tablets By Mouth Once Daily 7)  Alprazolam 0.5 Mg Tabs (Alprazolam) .... Take 1/2 Tablet By Mouth Before Bedtime While Taking Prednisone 8)  Remicade 100 Mg Solr (Infliximab) .... Every 8 Weeks 9)  Integra Plus  Caps (Fefum-Fepoly-Fa-B Cmp-C-Biot) .... One Capsule By Mouth Once Daily 10)  Prednisone 10 Mg Tabs (Prednisone) .... One Tablet By Mouth Once Daily 11)  Vitamin D (Ergocalciferol) 50000 Unit Caps (Ergocalciferol) .... One Tablet By Mouth Once Daily 12)  Prilosec Otc 20 Mg Tbec (Omeprazole Magnesium) .... One Tablet By Mouth Once Daily As Needed 13)  Amlodipine Besylate 2.5 Mg Tabs (Amlodipine Besylate) .... 1/2 -One Tablet By Mouth Once Daily  Allergies (verified): 1)  ! Atenolol 2)  ! Keflex  Past History:  Past Medical History: Reviewed history from 09/17/2009 and no changes required. Current Problems:  Ulcerative colitis- Supraventricular tachycardia History of meningioma status post resection deficit complicating the above requiring hearing aid implant-further complicated by recurrent infections Barrett Henle disease Hoarseness Anxiety/depression  Tachycardia  Past Surgical History: Reviewed history from 09/17/2009 and no changes required. Brain surgery 10/08  hearing implant  Family History: Reviewed history from 02/26/2008 and no changes required. Family History of Heart Disease: Mother Maternal Grandfather Family History of Diabetes: Uncle No FH of Colon Cancer:  Social History: Reviewed history from 03/04/2009 and no  changes required. Married, 1 girl Patient has never smoked.  Alcohol Use - no Patient does not get regular exercise.  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  The patient denies allergy/sinus, anemia, anxiety-new, arthritis/joint pain, back pain, blood in urine, breast changes/lumps, change in vision, confusion, cough, coughing up blood, depression-new, fainting, fever, headaches-new, hearing problems, heart murmur, heart rhythm changes, itching, menstrual pain, muscle pains/cramps, night sweats, nosebleeds, pregnancy symptoms, shortness of breath, skin rash, sleeping problems, sore throat, swelling of feet/legs, swollen lymph glands, thirst - excessive , urination - excessive , urination changes/pain, urine leakage, vision changes, and voice change.         Pertinent positive and negative review of systems were noted in the above HPI. All other ROS was otherwise negative.   Vital Signs:  Patient profile:   50 year old female Height:      59 inches Weight:      168.50 pounds BMI:     34.16 Pulse rate:   70 / minute Pulse rhythm:   regular BP sitting:   126 / 76  (right arm) Cuff size:   regular  Vitals Entered By: Christie Nottingham CMA Duncan Dull) (September 23, 2010 4:56 PM)  Physical Exam  General:  Well developed, well nourished, no acute distress. Eyes:  PERRLA, no icterus. Mouth:  No deformity or lesions, dentition normal. Lungs:  Clear throughout to auscultation. Heart:  Regular rate and rhythm; no murmurs, rubs,  or bruits. Abdomen:  soft abdomen with normoactive bowel sounds. No distention. No tenderness. Liver edge at costal margin. Rectal:  rectal and anoscopic exam reveals soft mildly inflamed mucosa of the rectal ampulla with erythema but no friability or contact bleeding. Stool was Hemoccult negative. Neurologic:  Alert and oriented x4;  grossly normal neurologically. Skin:  Intact without  significant lesions or rashes. Psych:  Alert and cooperative. Normal mood and affect.   Impression & Recommendations:  Problem # 1:  ULCERATIVE COLITIS (ICD-556.9) Patient should continue prednisone 10 mg daily for 3 weeks or at least until her next Remicade infusion. She will call us back to decide if it can be tapered to 9 mg a day. She is to continue all other medications including Imuran and Remicade.  Problem # 2:  GERD (ICD-530.81) I have suggested patient take Reglan 5 mg before meals but she was feeling much better today and decided to wait through the weekend to see if she will need Reglan.  Other Orders: Vit B12 1000 mcg ([id number])  Patient Instructions: 1)  continue Prilosec 20 mg a day. 2)  Continue prednisone 10 mg daily for 3 weeks or at least until Remicade infusion. as per scheduled appointm. 3)  Continue all other medications. 4)  Consider Reglan 5 mg before meals. 5)  Copy sent to : Dr Elesa Massed   Medication Administration  Injection # 1:    Medication: Vit B12 1000 mcg    Diagnosis: VITAMIN B12 DEFICIENCY (ICD-266.2)    Route: IM    Site: R deltoid    Exp Date: 07/11/2011    Lot #: [id number]    Mfr: American Regent    Patient tolerated injection without  complications    Given by: Lamona Curl CMA Duncan Dull) (September 23, 2010 6:32 PM)  Orders Added: 1)  Vit B12 1000 mcg [[id number]]

## 2010-10-14 ENCOUNTER — Encounter (HOSPITAL_COMMUNITY): Payer: Self-pay

## 2010-10-17 ENCOUNTER — Other Ambulatory Visit: Payer: Self-pay

## 2010-10-17 ENCOUNTER — Other Ambulatory Visit: Payer: Self-pay | Admitting: Internal Medicine

## 2010-10-17 ENCOUNTER — Other Ambulatory Visit: Payer: BC Managed Care – PPO

## 2010-10-17 ENCOUNTER — Ambulatory Visit (INDEPENDENT_AMBULATORY_CARE_PROVIDER_SITE_OTHER): Payer: BC Managed Care – PPO | Admitting: Internal Medicine

## 2010-10-17 ENCOUNTER — Encounter: Payer: Self-pay | Admitting: Internal Medicine

## 2010-10-17 ENCOUNTER — Encounter (INDEPENDENT_AMBULATORY_CARE_PROVIDER_SITE_OTHER): Payer: Self-pay | Admitting: *Deleted

## 2010-10-17 DIAGNOSIS — K5100 Ulcerative (chronic) pancolitis without complications: Secondary | ICD-10-CM

## 2010-10-17 DIAGNOSIS — K625 Hemorrhage of anus and rectum: Secondary | ICD-10-CM

## 2010-10-17 DIAGNOSIS — K5190 Ulcerative colitis, unspecified, without complications: Secondary | ICD-10-CM

## 2010-10-17 DIAGNOSIS — E538 Deficiency of other specified B group vitamins: Secondary | ICD-10-CM

## 2010-10-17 LAB — CBC WITH DIFFERENTIAL/PLATELET
Basophils Absolute: 0 10*3/uL (ref 0.0–0.1)
Basophils Relative: 0.3 % (ref 0.0–3.0)
Eosinophils Absolute: 0 10*3/uL (ref 0.0–0.7)
Eosinophils Relative: 0.5 % (ref 0.0–5.0)
HCT: 43.2 % (ref 36.0–46.0)
Hemoglobin: 14.6 g/dL (ref 12.0–15.0)
Lymphocytes Relative: 21.4 % (ref 12.0–46.0)
Lymphs Abs: 0.9 10*3/uL (ref 0.7–4.0)
MCHC: 33.9 g/dL (ref 30.0–36.0)
MCV: 94.6 fl (ref 78.0–100.0)
Monocytes Absolute: 0.4 10*3/uL (ref 0.1–1.0)
Monocytes Relative: 9.1 % (ref 3.0–12.0)
Neutro Abs: 3 10*3/uL (ref 1.4–7.7)
Neutrophils Relative %: 68.7 % (ref 43.0–77.0)
Platelets: 204 10*3/uL (ref 150.0–400.0)
RBC: 4.56 Mil/uL (ref 3.87–5.11)
RDW: 14.5 % (ref 11.5–14.6)
WBC: 4.4 10*3/uL — ABNORMAL LOW (ref 4.5–10.5)

## 2010-10-18 LAB — COMPREHENSIVE METABOLIC PANEL
ALT: 11 U/L (ref 0–35)
AST: 22 U/L (ref 0–37)
Albumin: 4 g/dL (ref 3.5–5.2)
Alkaline Phosphatase: 45 U/L (ref 39–117)
BUN: 12 mg/dL (ref 6–23)
CO2: 31 mEq/L (ref 19–32)
Calcium: 9.1 mg/dL (ref 8.4–10.5)
Chloride: 102 mEq/L (ref 96–112)
Creatinine, Ser: 0.7 mg/dL (ref 0.4–1.2)
GFR: 89.82 mL/min (ref >60.00)
Glucose, Bld: 84 mg/dL (ref 70–99)
Potassium: 4.2 mEq/L (ref 3.5–5.1)
Sodium: 139 mEq/L (ref 135–145)
Total Bilirubin: 0.5 mg/dL (ref 0.3–1.2)
Total Protein: 7.3 g/dL (ref 6.0–8.3)

## 2010-10-18 LAB — IRON: Iron: 97 ug/dL (ref 42–145)

## 2010-10-18 LAB — IBC PANEL
Iron: 97 ug/dL (ref 42–145)
Saturation Ratios: 31.9 % (ref 20.0–50.0)
Transferrin: 217.5 mg/dL (ref 212.0–360.0)

## 2010-10-18 LAB — FOLATE: Folate: 15.6 ng/mL (ref >5.9)

## 2010-10-18 LAB — FERRITIN: Ferritin: 23.7 ng/mL (ref 10.0–291.0)

## 2010-10-18 LAB — VITAMIN B12: Vitamin B-12: 893 pg/mL (ref 211–911)

## 2010-10-20 ENCOUNTER — Encounter: Payer: Self-pay | Admitting: Internal Medicine

## 2010-10-21 ENCOUNTER — Encounter: Payer: Self-pay | Admitting: Internal Medicine

## 2010-10-21 ENCOUNTER — Encounter (HOSPITAL_COMMUNITY): Payer: BC Managed Care – PPO | Attending: Internal Medicine

## 2010-10-21 DIAGNOSIS — K5190 Ulcerative colitis, unspecified, without complications: Secondary | ICD-10-CM | POA: Insufficient documentation

## 2010-10-25 NOTE — Assessment & Plan Note (Signed)
Summary: ROUTINE F/U COLITIS.Marland KitchenNEEDS TB SKIN TEST TODAY!...DNS   Vital Signs:  Patient profile:   50 year old female Height:      59 inches Weight:      172 pounds BMI:     34.87 Pulse rate:   70 / minute Pulse rhythm:   regular BP sitting:   124 / 72  (left arm) Cuff size:   regular  Vitals Entered By: Christie Nottingham CMA Duncan Dull) (October 17, 2010 4:34 PM)  History of Present Illness Visit Type: Follow-up Visit Primary GI MD: [name] MD Primary Provider: [name], MD Requesting Provider: n/a Chief Complaint: f/u UC, less frequent BM's after increasing prednisone. Pt states her bleeding has slowed down. Pt states she does not have any more vomiting after meals.  History of Present Illness:   This is a 50 year old white female with ulcerative colitis of more than 10 years duration. It is predominately left sided. She is steroid dependent. She is on Remicade  5 mg/kg every 8 weeks and imuran 100 mg daily. She has been on a very slow prednisone taper and has been unable to reduce the prednisone below 8 mg daily. She is currently on 10 mg daily and is doing well. She has rectal bleeding very rarely. There has been no abdominal pain, nausea or vomiting. There has been mild fluid retention.     GI Review of Systems      Denies abdominal pain, acid reflux, belching, bloating, chest pain, dysphagia with liquids, dysphagia with solids, heartburn, loss of appetite, nausea, vomiting, vomiting blood, weight loss, and  weight gain.      Reports diarrhea and  rectal bleeding.     Denies anal fissure, black tarry stools, change in bowel habit, fecal incontinence, heme positive stool, hemorrhoids, irritable bowel syndrome, jaundice, light color stool, liver problems, and  rectal pain.  Current Medications (verified): 1)  Lialda 1.2 Gm Tbec (Mesalamine) .... Four Tablets By Mouth Daily 2)  Prozac 20 Mg Caps (Fluoxetine Hcl) .... Once Daily 3)  Bentyl 10 Mg Caps (Dicyclomine Hcl) .... Take  1 Capsule By Mouth Two Times A Day As Needed For Cramps 4)  Klor-Con M20 20 Meq Cr-Tabs (Potassium Chloride Crys Cr) .... 2 Tablets By Mouth Once Daily 5)  Metoprolol Succinate 50 Mg Xr24h-Tab (Metoprolol Succinate) .Marland Kitchen.. 1 Tablet Once Daily 6)  Imuran 50 Mg Tabs (Azathioprine) .... Take 2 Tablets By Mouth Once Daily 7)  Alprazolam 0.5 Mg Tabs (Alprazolam) .... Take 1/2 Tablet By Mouth Before Bedtime While Taking Prednisone 8)  Remicade 100 Mg Solr (Infliximab) .... Every 8 Weeks 9)  Integra Plus  Caps (Fefum-Fepoly-Fa-B Cmp-C-Biot) .... One Capsule By Mouth Once Daily 10)  Prednisone 10 Mg Tabs (Prednisone) .... One Tablet By Mouth Once Daily 11)  Vitamin D (Ergocalciferol) 50000 Unit Caps (Ergocalciferol) .... Once A Week 12)  Prilosec Otc 20 Mg Tbec (Omeprazole Magnesium) .... One Tablet By Mouth Once Daily As Needed  Allergies (verified): 1)  ! Atenolol 2)  ! Keflex  Past History:  Past Medical History: Reviewed history from 09/17/2009 and no changes required. Current Problems:  Ulcerative colitis- Supraventricular tachycardia History of meningioma status post resection deficit complicating the above requiring hearing aid implant-further complicated by recurrent infections Barrett Henle disease Hoarseness Anxiety/depression  Tachycardia  Past Surgical History: Reviewed history from 09/17/2009 and no changes required. Brain surgery 10/08 hearing implant  Family History: Reviewed history from 02/26/2008 and no changes required. Family History of Heart Disease: Mother  Maternal Grandfather Family History of Diabetes: Uncle No FH of Colon Cancer:  Social History: Reviewed history from 03/04/2009 and no changes required. Married, 1 girl Patient has never smoked.  Alcohol Use - no Patient does not get regular exercise.  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 Drug Use - no  Review of Systems  The patient denies allergy/sinus, anemia, anxiety-new,  arthritis/joint pain, back pain, blood in urine, breast changes/lumps, change in vision, confusion, cough, coughing up blood, depression-new, fainting, fatigue, fever, headaches-new, hearing problems, heart murmur, heart rhythm changes, itching, menstrual pain, muscle pains/cramps, night sweats, nosebleeds, pregnancy symptoms, shortness of breath, skin rash, sleeping problems, sore throat, swelling of feet/legs, swollen lymph glands, thirst - excessive , urination - excessive , urination changes/pain, urine leakage, vision changes, and voice change.         Pertinent positive and negative review of systems were noted in the above HPI. All other ROS was otherwise negative.   Physical Exam  General:  mildly cushingoid, alert and oriented. Eyes:  nonicteric. Neck:  palpable thyroid. Lungs:  Clear throughout to auscultation. Heart:  Regular rate and rhythm; no murmurs, rubs,  or bruits. Abdomen:  soft nontender abdomen with normoactive bowel sounds. No distention. Liver edge at costal margin. Rectal:  soft Hemoccult negative stool. Extremities:  No clubbing, cyanosis, edema or deformities noted. Skin:  Intact without significant lesions or rashes. Psych:  Alert and cooperative. Normal mood and affect.   Impression & Recommendations:  Problem # 1:  VITAMIN B12 DEFICIENCY (ICD-266.2) We will recheck her B12 today and we will give her vitamin B12 1,000 mcg IM today.  Orders: TLB-CBC Platelet - w/Differential (85025-CBCD) TLB-CMP (Comprehensive Metabolic Pnl) (80053-COMP) TLB-B12, Serum-Total ONLY (16109-U04) TLB-Ferritin (82728-FER) TLB-Folic Acid (Folate) (82746-FOL) TLB-Iron, (Fe) Total (83540-FE) TLB-IBC Pnl (Iron/FE;Transferrin) (83550-IBC) Vit B12 1000 mcg (J3420)  Problem # 2:  ULCERATIVE COLITIS (ICD-556.9) Patient is steroid dependent. She is doing well on prednisone 10 mg. She does continue on the same dose of prednisone as well as on Remicade and Imuran. We will obtain Labs   today. Orders: TLB-CBC Platelet - w/Differential (85025-CBCD) TLB-CMP (Comprehensive Metabolic Pnl) (80053-COMP) TLB-B12, Serum-Total ONLY (54098-J19) TLB-Ferritin (82728-FER) TLB-Folic Acid (Folate) (82746-FOL) TLB-Iron, (Fe) Total (83540-FE) TLB-IBC Pnl (Iron/FE;Transferrin) (83550-IBC)  Problem # 3:  HEMORRHOIDS-EXTERNAL (ICD-455.3) no change.  Other Orders: TB Skin Test [phone]) Admin 1st Vaccine (95621)  Patient Instructions: 1)  We have given you a TB skin test today. You need to have this test read either Wednesday after 5pm or Thursday before 5 pm. 2)  We have given you your monthly B12 injection today. Please schedule your next injection 1 month from now if you have not already done so. 3)  Continue Imuran 100 g daily. 4)  Continue Remicade 5 mg/kg every 8 weeks. 5)  Continue prednisone 10 mg daily. 6)  Lab work today including B12, CBC, metabolic panel and iron studies. 7)  Office visit 3 months. 8)  Copy sent to : Dr Benedetto Coons 9)  The medication list was reviewed and reconciled.  All changed / newly prescribed medications were explained.  A complete medication list was provided to the patient / caregiver.   Medication Administration  Injection # 1:    Medication: Vit B12 1000 mcg    Diagnosis: VITAMIN B12 DEFICIENCY (ICD-266.2)    Route: IM    Site: R deltoid    Exp Date: 11/13    Lot #: [id number]    Mfr: American Regent    Patient tolerated injection  without complications    Given by: Lamona Curl CMA Duncan Dull) (October 17, 2010 5:52 PM)  Orders Added: 1)  TB Skin Test [86580] 2)  Admin 1st Vaccine [90471] 3)  TLB-CBC Platelet - w/Differential [85025-CBCD] 4)  TLB-CMP (Comprehensive Metabolic Pnl) [80053-COMP] 5)  TLB-B12, Serum-Total ONLY [82607-B12] 6)  TLB-Ferritin [82728-FER] 7)  TLB-Folic Acid (Folate) [82746-FOL] 8)  TLB-Iron, (Fe) Total [83540-FE] 9)  TLB-IBC Pnl (Iron/FE;Transferrin) [83550-IBC] 10)  Vit B12 1000 mcg [J3420]   Immunizations  Administered:  PPD Skin Test:    Vaccine Type: PPD    Site: left forearm    Mfr: Sanofi Pasteur    Dose: 0.1 ml    Route: ID    Given by: Lamona Curl CMA (AAMA)    Exp. Date: 03/09/2012    Lot #: [id number]   Immunizations Administered:  PPD Skin Test:    Vaccine Type: PPD    Site: left forearm    Mfr: Sanofi Pasteur    Dose: 0.1 ml    Route: ID    Given by: Lamona Curl CMA (AAMA)    Exp. Date: 03/09/2012    Lot #: [id number]   Medication Administration  Injection # 1:    Medication: Vit B12 1000 mcg    Diagnosis: VITAMIN B12 DEFICIENCY (ICD-266.2)    Route: IM    Site: R deltoid    Exp Date: 11/13    Lot #: [id number]    Mfr: American Regent    Patient tolerated injection without complications    Given by: Lamona Curl CMA Duncan Dull) (October 17, 2010 5:52 PM)  Orders Added: 1)  TB Skin Test [86580] 2)  Admin 1st Vaccine [90471] 3)  TLB-CBC Platelet - w/Differential [85025-CBCD] 4)  TLB-CMP (Comprehensive Metabolic Pnl) [80053-COMP] 5)  TLB-B12, Serum-Total ONLY [82607-B12] 6)  TLB-Ferritin [82728-FER] 7)  TLB-Folic Acid (Folate) [82746-FOL] 8)  TLB-Iron, (Fe) Total [83540-FE] 9)  TLB-IBC Pnl (Iron/FE;Transferrin) [83550-IBC] 10)  Vit B12 1000 mcg [J3420]    Appended Document: ROUTINE F/U COLITIS.Marland KitchenNEEDS TB SKIN TEST TODAY!...DNS    Clinical Lists Changes  Observations: Added new observation of TB PPDRESULT: negative (10/20/2010 13:07) Added new observation of PPD RESULT: < 5mm (10/20/2010 13:07) Added new observation of TB-PPD RDDTE: 10/20/2010 (10/20/2010 13:07)       PPD Results    Date of reading: 10/20/2010    Results: < 5mm    Interpretation: negative

## 2010-10-25 NOTE — Miscellaneous (Signed)
Summary: Orders Update  Clinical Lists Changes  Orders: Added new Test order of Remicade Infusion (Remicade) - Signed 

## 2010-11-03 NOTE — Letter (Signed)
Summary: [facility]  [facility]   Imported By: Lester Linn 10/27/2010 10:53:13  _____________________________________________________________________  External Attachment:    Type:   Image     Comment:   External Document

## 2010-11-13 LAB — POCT I-STAT, CHEM 8
BUN: 13 mg/dL (ref 6–23)
Calcium, Ion: 1.04 mmol/L — ABNORMAL LOW (ref 1.12–1.32)
Chloride: 111 mEq/L (ref 96–112)
Creatinine, Ser: 0.2 mg/dL — ABNORMAL LOW (ref 0.4–1.2)
Glucose, Bld: 143 mg/dL — ABNORMAL HIGH (ref 70–99)
HCT: 38 % (ref 36.0–46.0)
Hemoglobin: 12.9 g/dL (ref 12.0–15.0)
Potassium: 3.7 mEq/L (ref 3.5–5.1)
Sodium: 143 mEq/L (ref 135–145)
TCO2: 25 mmol/L (ref 0–100)

## 2010-11-14 LAB — DIFFERENTIAL
Basophils Absolute: 0 10*3/uL (ref 0.0–0.1)
Basophils Relative: 0 % (ref 0–1)
Eosinophils Absolute: 0 10*3/uL (ref 0.0–0.7)
Eosinophils Relative: 1 % (ref 0–5)
Lymphocytes Relative: 44 % (ref 12–46)
Lymphs Abs: 2.6 10*3/uL (ref 0.7–4.0)
Monocytes Absolute: 0.6 10*3/uL (ref 0.1–1.0)
Monocytes Relative: 10 % (ref 3–12)
Neutro Abs: 2.6 10*3/uL (ref 1.7–7.7)
Neutrophils Relative %: 45 % (ref 43–77)

## 2010-11-14 LAB — CBC
HCT: 37.2 % (ref 36.0–46.0)
Hemoglobin: 12.5 g/dL (ref 12.0–15.0)
MCHC: 33.6 g/dL (ref 30.0–36.0)
MCV: 85.1 fL (ref 78.0–100.0)
Platelets: 246 10*3/uL (ref 150–400)
RBC: 4.38 MIL/uL (ref 3.87–5.11)
RDW: 18.4 % — ABNORMAL HIGH (ref 11.5–15.5)
WBC: 5.8 10*3/uL (ref 4.0–10.5)

## 2010-11-16 LAB — HCG, SERUM, QUALITATIVE: Preg, Serum: NEGATIVE

## 2010-11-17 ENCOUNTER — Other Ambulatory Visit: Payer: Self-pay | Admitting: Internal Medicine

## 2010-11-30 LAB — DIFFERENTIAL
Basophils Absolute: 0 10*3/uL (ref 0.0–0.1)
Basophils Relative: 0 % (ref 0–1)
Eosinophils Absolute: 0 10*3/uL (ref 0.0–0.7)
Eosinophils Relative: 0 % (ref 0–5)
Lymphocytes Relative: 11 % — ABNORMAL LOW (ref 12–46)
Lymphs Abs: 1 10*3/uL (ref 0.7–4.0)
Monocytes Absolute: 0.3 10*3/uL (ref 0.1–1.0)
Monocytes Relative: 4 % (ref 3–12)
Neutro Abs: 7.3 10*3/uL (ref 1.7–7.7)
Neutrophils Relative %: 85 % — ABNORMAL HIGH (ref 43–77)

## 2010-11-30 LAB — CBC
HCT: 41.3 % (ref 36.0–46.0)
Hemoglobin: 14.3 g/dL (ref 12.0–15.0)
MCHC: 34.5 g/dL (ref 30.0–36.0)
MCV: 88.5 fL (ref 78.0–100.0)
Platelets: 242 10*3/uL (ref 150–400)
RBC: 4.67 MIL/uL (ref 3.87–5.11)
RDW: 14 % (ref 11.5–15.5)
WBC: 8.6 10*3/uL (ref 4.0–10.5)

## 2010-11-30 LAB — BASIC METABOLIC PANEL
BUN: 12 mg/dL (ref 6–23)
CO2: 25 mEq/L (ref 19–32)
Calcium: 9.2 mg/dL (ref 8.4–10.5)
Chloride: 104 mEq/L (ref 96–112)
Creatinine, Ser: 0.79 mg/dL (ref 0.4–1.2)
GFR calc Af Amer: >60 mL/min (ref >60)
GFR calc non Af Amer: >60 mL/min (ref >60)
Glucose, Bld: 98 mg/dL (ref 70–99)
Potassium: 3.2 mEq/L — ABNORMAL LOW (ref 3.5–5.1)
Sodium: 140 mEq/L (ref 135–145)

## 2010-11-30 LAB — POCT CARDIAC MARKERS
CKMB, poc: 2.3 ng/mL (ref 1.0–8.0)
Myoglobin, poc: 117 ng/mL (ref 12–200)
Troponin i, poc: <0.05 ng/mL (ref 0.00–0.09)

## 2010-11-30 LAB — D-DIMER, QUANTITATIVE: D-Dimer, Quant: <0.22 ug/mL-FEU (ref 0.00–0.48)

## 2010-12-01 LAB — CBC
HCT: 42.6 % (ref 36.0–46.0)
Hemoglobin: 14.6 g/dL (ref 12.0–15.0)
MCHC: 34.4 g/dL (ref 30.0–36.0)
MCV: 88.9 fL (ref 78.0–100.0)
Platelets: 221 10*3/uL (ref 150–400)
RBC: 4.79 MIL/uL (ref 3.87–5.11)
RDW: 14.6 % (ref 11.5–15.5)
WBC: 7.7 10*3/uL (ref 4.0–10.5)

## 2010-12-01 LAB — POCT I-STAT, CHEM 8
BUN: 9 mg/dL (ref 6–23)
Calcium, Ion: 1.16 mmol/L (ref 1.12–1.32)
Chloride: 107 mEq/L (ref 96–112)
Creatinine, Ser: 0.7 mg/dL (ref 0.4–1.2)
Glucose, Bld: 114 mg/dL — ABNORMAL HIGH (ref 70–99)
HCT: 45 % (ref 36.0–46.0)
Hemoglobin: 15.3 g/dL — ABNORMAL HIGH (ref 12.0–15.0)
Potassium: 3.6 mEq/L (ref 3.5–5.1)
Sodium: 143 mEq/L (ref 135–145)
TCO2: 28 mmol/L (ref 0–100)

## 2010-12-01 LAB — POCT CARDIAC MARKERS
CKMB, poc: <1 ng/mL — ABNORMAL LOW (ref 1.0–8.0)
Myoglobin, poc: 46.2 ng/mL (ref 12–200)
Troponin i, poc: <0.05 ng/mL (ref 0.00–0.09)

## 2010-12-01 LAB — DIFFERENTIAL
Basophils Absolute: 0 10*3/uL (ref 0.0–0.1)
Basophils Relative: 0 % (ref 0–1)
Eosinophils Absolute: 0 10*3/uL (ref 0.0–0.7)
Eosinophils Relative: 0 % (ref 0–5)
Lymphocytes Relative: 10 % — ABNORMAL LOW (ref 12–46)
Lymphs Abs: 0.8 10*3/uL (ref 0.7–4.0)
Monocytes Absolute: 0.2 10*3/uL (ref 0.1–1.0)
Monocytes Relative: 3 % (ref 3–12)
Neutro Abs: 6.8 10*3/uL (ref 1.7–7.7)
Neutrophils Relative %: 87 % — ABNORMAL HIGH (ref 43–77)

## 2010-12-16 ENCOUNTER — Encounter (HOSPITAL_COMMUNITY): Payer: BC Managed Care – PPO | Attending: Internal Medicine

## 2010-12-16 DIAGNOSIS — K5190 Ulcerative colitis, unspecified, without complications: Secondary | ICD-10-CM | POA: Insufficient documentation

## 2011-01-02 ENCOUNTER — Encounter: Payer: Self-pay | Admitting: Internal Medicine

## 2011-01-02 ENCOUNTER — Ambulatory Visit (INDEPENDENT_AMBULATORY_CARE_PROVIDER_SITE_OTHER): Payer: BC Managed Care – PPO | Admitting: Internal Medicine

## 2011-01-02 DIAGNOSIS — K5190 Ulcerative colitis, unspecified, without complications: Secondary | ICD-10-CM

## 2011-01-02 DIAGNOSIS — D680 Von Willebrand's disease: Secondary | ICD-10-CM

## 2011-01-02 DIAGNOSIS — D689 Coagulation defect, unspecified: Secondary | ICD-10-CM

## 2011-01-02 DIAGNOSIS — D849 Immunodeficiency, unspecified: Secondary | ICD-10-CM

## 2011-01-02 DIAGNOSIS — K5150 Left sided colitis without complications: Secondary | ICD-10-CM

## 2011-01-02 NOTE — Patient Instructions (Addendum)
You have been scheduled for a colonoscopy. Please follow written instructions given to you at your visit today.  Please pick up your Moviprep kit at the pharmacy within the next 2-3 days. Please call Dr Dalene Carrow to find out if she would like you to take anything in preparation for colonoscopy in regards to your Von Willebrand's Disease. Your physician has requested that you go to the basement for the following lab work before your endoscopy: CBC, CMET, PT, PTT CC: Dr Earl Lites, Dr Dalene Carrow

## 2011-01-02 NOTE — Progress Notes (Signed)
Lauren [name] 1960/10/03 MRN [id number]     History of Present Illness:  This is a 50 year old white female with ulcerative colitis of more than 10 years duration. Her last office visit was in February 2012. She has a history of predominantly left-sided steroid-dependent ulcerative colitis. She is on Remicade 5 mg/kg every 8 weeks and Imuran 100 mg daily as well as Lialda 4.8 gm/day. A slow prednisone taper has not been successful. She is now on prednisone 10 mg daily and continues to see small amounts of blood in the stool daily. She has frequent urgent bowel movements and loose stool. Her labs in February were all normal including a potassium of 4.2, B12 of 800 and iron saturation of 31%. She has Von Willebrand's disease for which she takes nasal Factor VIII prior to surgical procedures. She has a history of SVT for which she is status post ablation. She has a history of meningioma. An upper abdominal ultrasound in August 2011 showed a normal common bile duct at 5.7 mm and 4 mm gallbladder polyp. It also showed 2 small hepatic hemangiomas and a left kidney calculous. Her last colonoscopy was in January 2004. Flexible sigmoidoscopies in October 2009 and November 2010 both showed severe colitis. She is due for a recall colonoscopy.   Past Medical History  Diagnosis Date  . Ulcerative colitis   . SVT (supraventricular tachycardia)   . Personal history of meningioma of the brain   . Von Willebrands disease   . Anxiety and depression   . Vitamin B12 deficiency   . GERD (gastroesophageal reflux disease)   . External hemorrhoids   . Tennis elbow    Past Surgical History  Procedure Date  . Brain meningioma excision 05/2007  . Implantation bone anchored hearing aid   . Cardiac electrophysiology study and ablation 10/22/09    reports that she has never smoked. She has never used smokeless tobacco. She reports that she does not drink alcohol or use illicit drugs. family history includes Diabetes  in her maternal uncle; Heart disease in her maternal grandfather and mother; and Stroke in her mother.  There is no history of Colon cancer. Allergies  Allergen Reactions  . Atenolol   . Cephalexin         Review of Systems: Denies shortness of breath chest pain or dysphagia. Positive for heme E. urgency and diarrhea  The remainder of the 10  point ROS is negative except as outlined in H&P   Physical Exam: General appearance  Well developed, in no distress. Eyes- non icteric. HEENT nontraumatic, normocephalic. Mouth no lesions, tongue papillated, no cheilosis. Neck supple without adenopathy, thyroid not enlarged, no carotid bruits, no JVD. Lungs Clear to auscultation bilaterally. Cor normal S1 normal S2, regular rhythm , no murmur,  quiet precordium. Abdomen soft, nontender abdomen with normal active bowel sounds. No focal tenderness. Rectal: Not done Extremities no pedal edema. Skin no lesions. Neurological alert and oriented x 3. Psychological normal mood and affect.  Assessment and Plan:  Problems #1 Symptomatic ulcerative colitis. Patient is on maximum medical therapy. She is due for a repeat colonoscopy to define the extent and severity of the disease. We may need to consider shortening the intervals between Remicade infusions or possibly increasing the Remicade dose per infusion. We will continue her on the same dose of prednisone for the moment until her colonoscopy. We will repeat a PT, PTT , CBC and metabolic panel on the day of the colonoscopy.  Problem #2 hematochezia. She  has coagulopathy which has not in the past caused any significant medical problems. We will check with her hematologist about coverage with factor VIII prior to her colonoscopy.  Number #3 gastroesophageal reflux. Patient has a history of cough and hoarseness which was fully evaluated. She is not on any medications. She is asymptomatic at this time.  01/02/2011 [name]

## 2011-01-04 ENCOUNTER — Telehealth: Payer: Self-pay | Admitting: *Deleted

## 2011-01-04 DIAGNOSIS — D680 Von Willebrand's disease: Secondary | ICD-10-CM

## 2011-01-04 NOTE — Telephone Encounter (Signed)
We have gotten orders from Dr Dalene Carrow that she would like for patient to have DDAVP 0.3 mcg/kg in 50 ml normal saline IV 1 hour prior to her colonoscopy procedure to be infused over 20 minutes. She would like patient to restrict free water and would like to closely monitor patient for hyponatremia. Dr Dalene Carrow would also recommend a BMET 24 hours following procedure as well. She wants patient to spray Stimate to both nostrils 24 hours following her procedure as well if biopsies are obtained which patient already has on hand. Patient was originally scheduled for colonoscopy in [facility]. However, due to the complexity of this case and need for DDAVP IV, patient will have procedure completed at the hospital on 01/25/11 @ 8:45 am. I have spoken to Clydie Braun and have scheduled patient (booking [phone]) and have advised her that patient will indeed need DDAVP 1 hour prior to test. I have also faxed over Dr Lonell Face specific orders to [facility]. Patient will be given new instructions and will come for BMET 24 hours after test has been completed (which would be on Thursday at 8:45 am)

## 2011-01-04 NOTE — Telephone Encounter (Signed)
Reviewed and agree.

## 2011-01-10 NOTE — Discharge Summary (Signed)
Lauren [name], WELLING                ACCOUNT NO.:  [id number]   MEDICAL RECORD NO.:  [id number]          PATIENT TYPE:  INP   LOCATION:  8185                         FACILITY:  [facility]   PHYSICIAN:  Earleen Newport, M.D.  DATE OF BIRTH:  14-Oct-1960   DATE OF ADMISSION:  06/27/2007  DATE OF DISCHARGE:  07/01/2007                               DISCHARGE SUMMARY   ADMITTING DIAGNOSIS:  Right cerebellopontine angle tumor.   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   MAJOR OPERATION:  Right retromastoid craniectomy and gross total  resection of meningioma using microsurgical technique operating  microscope and VII, VIII and XI cranial nerve monitoring   HOSPITAL COURSE:  Ms. 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  She was advised regarding the need for surgical  intervention.  It was noted also that she had a modest bleeding disorder  with an increased bleeding time and hematologic workup demonstrated she  had a mild factory VIII deficiency.  She was taken to the operating room  on the day of admission where she underwent right retromastoid  craniectomy with monitoring of VII, VIII and XI cranial nerves a large  posterior fossa meningioma was resected using microsurgical technique.  She did experience some mild hearing loss preoperatively and monitoring  of the nerve VIII during the surgery yielded early evidence of severe  dysfunction in  nerve VIII.  Underwent surgical resection successfully.  However, she had total hearing loss in the right ear postoperatively.  She had good facial nerve function, good nerve XI function.  She was  able to be mobilized during the postoperative period on hospital days  #1, #2 and #3, incision is clean and dry and  surgical staples have been  removed.  The patient is undergoing rapid Decadron taper and is  discharged home with Decadron and Darvocet for pain control.  She will  be seen in the office in two weeks' time for further follow-up.   CONDITION ON DISCHARGE:  Improving.      Earleen Newport, M.D.  Electronically Signed     HJE/MEDQ  D:  07/01/2007  T:  07/02/2007  Job:  [id number]

## 2011-01-10 NOTE — Op Note (Signed)
Lauren [name], Lauren [name]                ACCOUNT NO.:  [id number]   MEDICAL RECORD NO.:  [id number]          PATIENT TYPE:  AMB   LOCATION:  SDS                          FACILITY:  [facility]   PHYSICIAN:  Leta Baptist, MD            DATE OF BIRTH:  1961-03-12   DATE OF PROCEDURE:  02/27/2008  DATE OF DISCHARGE:  02/27/2008                               OPERATIVE REPORT   SURGEON:  Leta Baptist, MD   PREOPERATIVE DIAGNOSES:  1. Right profound hearing loss.  2. History of right meningioma, status post resection.   POSTOPERATIVE DIAGNOSES:  1. Right profound hearing loss.  2. History of right meningioma, status post resection.   PROCEDURE PERFORMED:  Placement of ostial integrated right-sided bone  anchored hearing aid.   ANESTHESIA:  General endotracheal tube anesthesia.   COMPLICATIONS:  None.   ESTIMATED BLOOD LOSS:  Minimal.   INDICATIONS FOR PROCEDURE:  Lauren [name] is a 50 year old white female  with a history of right skull base meningioma.  She underwent resection  of the meningioma late last year.  Since the surgery, the patient has  lost hearing in her right ear.  It resulted in profound right-sided  hearing loss.  Based on that finding, the decision was made to place an  ostial-integrated right-sided hearing implant.  Risks, benefits,  alternatives, and details of the BAHA procedure were discussed with the  patient.  Questions were invited and answered.  Informed consent was  obtained.   DESCRIPTION:  The patient was taken to the operating room and placed in  supine on the operating table.  General endotracheal tube anesthesia was  administered by the anesthesiologist.  Preop IV antibiotic was given.  The patient was then positioned and prepped and draped in standard  fashion for right-sided BAHA implantation.  The hair overlying the right  postauricular area was shaved.  A 1% lidocaine with 1:100,000  epinephrine were injected around the implant site.  A dermatome was then  used to  obtain the split thickness skin graft over the implant site.  The soft tissue below the skin graft was removed, leaving only the  periosteum intact.  A 4-mm hole was subsequently drilled.  A countersink  4-mm drill was used to further extend the hole.  The abutment for the  implant was subsequently screw onto the 4-mm hole.  The skin graft was  subsequently applied over the implant site.  It was sutured in place  with 4-0 Monocryl and 5-0 chromic sutures.  Xeroform dressing was then  fashioned together with cotton soaked with mineral oil.  The dressing  was sutured and placed with 2-0 silk sutures.  That concluded procedure  for the patient.  The care of the patient was turned over to the  anesthesiologist.  The patient was awakened from anesthesia without  difficulty.  She was extubated and transferred to the recovery room in  good condition.   OPERATIVE FINDINGS:  A 4-mm right-sided BAHA abutment implant was  placed.   SPECIMENS REMOVED:  None.   FOLLOWUP:  The patient is  instructed to leave the Xeroform dressing in  place for 1 week.  She will return for the dressing removal in 1 week.      Leta Baptist, MD  Electronically Signed     ST/MEDQ  D:  02/27/2008  T:  02/28/2008  Job:  [id number]

## 2011-01-10 NOTE — Assessment & Plan Note (Signed)
[facility] OFFICE NOTE   JERICCA, RUSSETT                       MRN:          [id number]  DATE:05/06/2008                            DOB:          01-16-1961    PROCEDURE:  Bravo pH monitoring study.   INDICATION:  This 50 year old white female has complained of cough for  at least several years' duration.  She has been treated for  gastroesophageal reflux with only partial response.  Her upper endoscopy  was essentially negative except for inflammatory polyps in the proximal  stomach distal from the GE junction and for retain gastric content.   This study was done while the patient was off her proton pump inhibitors  and suppressing agents.  The Bravo probe was placed endoscopically at 30  cm from the incisors.  After the endoscopic placement  the actual  location level  was measured at 26 cm from the incisors, which was about  10 cm above her lower esophageal sphincter.   RESULTS:  This was a 48-hour study.  The acid reflux analysis on the day  #1 showed no reflux episodes.  This included upright, supine, as well as  postprandial occurrence.  There was no heartburn or chest pain.  The  total DeMeester score was 0.3, normal less than 14.72.   On the second day of analysis, the patient again had no reflux episodes.  There were no reflux in upright or supine position, and no reflux  postprandially.  Total DeMeester score was 0.3, which was normal.   Her symptom analysis indicated that the patient has numerous coughing  episodeds none of them correlated with acid reflux.   IMPRESSION:  This is a negative intraesophageal pH monitoring study  showing a good wave form, but no recorded reflux.  The patient continued  to have cough despite of no registered reflux episodes.     Lowella Bandy. Olevia Perches, MD  Electronically Signed    DMB/MedQ  DD: 05/14/2008  DT: 05/15/2008  Job #: [id number]

## 2011-01-10 NOTE — Op Note (Signed)
Lauren [name], Lauren [name]                ACCOUNT NO.:  [id number]   MEDICAL RECORD NO.:  [id number]          PATIENT TYPE:  INP   LOCATION:  3172                         FACILITY:  [facility]   PHYSICIAN:  Earleen Newport, M.D.  DATE OF BIRTH:  11/08/1960   DATE OF PROCEDURE:  06/27/2007  DATE OF DISCHARGE:                               OPERATIVE REPORT   PREOPERATIVE DIAGNOSIS:  Right cerebellopontine angle tumor.   POSTOPERATIVE DIAGNOSIS:  Right cerebellopontine meningioma.   SURGEON:  Earleen Newport, M.D.   FIRST ASSISTANT:  Erline Levine, M.D.   ANESTHESIA:  General endotracheal.   INDICATIONS:  Lauren [name] is a 50 year old individual who has had significant  problems with head pain and headache and ultimately a workup  demonstrated the presence of a right cerebellopontine angle tumor.  She  has been complaining of some hearing loss and facial weakness on the  left side that was more recently noted.  She has been having problems  with unsteadiness also.  She was advised regarding surgical extirpation  of this tumor and after significant workup including workup of a  bleeding diathesis, which suggests that she has a minor factor VIII  deficiency, she is now taken to the operating room to undergo surgical  decompression.   PROCEDURE:  The patient was brought to the operating room supine on the  stretcher and after the smooth induction of general endotracheal  anesthesia, she was placed in a park bench position with the right side  facing up and the 3-pin headrest used to secure the head.  The bony  prominences were appropriately padded and protected carefully.  During  this time now, the electrophysiologic monitoring team connected monitors  for both 7th, 8th and 11th nerve function; these were tested and initial  brain stem auditory-evoked responses yielded a 5th wave from the brain  stem auditory-evoked response that was substantially delayed.  Initially, it was not present in the window  that was opened and it was  found to have a substantial delay beyond what was expected.  The left-  sided nerve appeared to have normal waves and normal dispersion pattern.  The procedure was then started after prepping the right retromastoid  area, clipping the hair and then prepping with alcohol and DuraPrep.  A  vertical retromastoid incision was creating and carried down to the  skull.  The area behind the mastoid process and in the suboccipital  region was dissected free.  With the mastoid being identified, a  craniectomy based on the mastoid being brought medially and down to the  ring of the foramen magnum was opened using a high-speed bur and a 4-mm  acorn bit.  As the bone was thin, a 2-mm Kerrison punch could be  inserted under the thinned edge of bone and then the craniectomy was  lifted and enlarged to expose the sigmoid sinus and the junction of the  transverse sinus on the lateral aspect and the sigmoid sinus was then  followed down in the lateral edge of the craniectomy.  Approximately 3  cm of transverse sinus medially were exposed and  an oblong craniectomy  was created in this opening.  The dura was then opened in a curvilinear  fashion following the sigmoid and transverse sinuses approximately a  centimeter and half from the edge.  The dura was T'd into the corner of  the sigmoid sinus to lift flaps opening out over the sigmoid sinus  itself.  The flaps were secured with 4-0 Nurolon sutures.  The medial  portion of the opening was secured to expose the cerebellum.  The  cerebellum was noted be readily pulsatile and dissection inferiorly  allowed [place] to open the arachnoid over the cisterna magna and this  allowed free drainage of spinal fluid, which readily allowed the  cerebellum to decompress.  The lateral margin of the cerebellum was then  retracted slightly; a Budde halo retractor system was used for this  purpose.  All during this time, brain stem auditory-evoked  monitoring  was being performed, again with the 5th wave noted to be very marginal.  The retraction on the cerebellum yielded a ready image of the  meningioma.  The meningioma was then cauterized on its lateral aspect  and the resection began with first obtaining some biopsies to be sent  for frozen section, which indeed yielded typical meningioma, and then by  reflecting the pia off of the tumor, several parasitized blood vessels  could be cauterized and divided away from the tumor and these were left  with the cerebellum.  The dissection then started to expose the superior-  most aspect of the tumor up near the angle of the tentorium and the  inferior-most aspect of the tumor, which yielded a small nerve present  just coming out from the under surface of this tumor.  With stimulation,  this was found to clearly be the accessory nerve.  Tumor was then  debulked in a piecemeal fashion, cauterizing the edge of the dura and  reflecting and removing tumor using suction cautery.  The ultrasonic  aspirator was used to aid in this dissection.  The tumor was noted be  rather hemorrhagic and with the patient's bleeding diathesis, we did use  some DDAVP, which seemed to work.  It was noted that the patient did  seem to be somewhat on oozy side, as electrocautery worked slowly that  required repeated applications and tamponade with Gelfoam-soaked  thrombin pledgets yielded slower than usual hemostasis.  Nonetheless,  the dissection was able to proceed.  As the cerebellum could be  reflected more medially and the superior aspect of the tumor was  resected, nerve came into view.  Further dissection revealed that we  were near the 7th nerve, as stimulation of the nerve yielded facial  movements.  More proximal dissection near the brain stem yielded the  root of the 7th nerve.  It was felt that the structure looping over the  top of the tumor was the 7th and 8th nerve complex; care was taken to   protected it.  The dissection in this case was done from within the  tumor out to the peeled margin and peeled edge of the tumor so as to  minimize any manipulation of the nerve root.  Ultimately bits and pieces  of tumor were resected from lateral to medial and the resection  ultimately yielded a fine plane of pia that was able to be separated  from the 7th and 8th nerve complex.  With the medial portion of the  resection, another nerve came into view just below this; this nerve  exited from the lateral side of the medulla and appeared to be  consistent with either the 9th through 10th nerve, more likely the 10th  nerve because of its large size.  Indeed, as minimal manipulation along  this nerve occurred, the patient's heart rate and blood pressure did  become modestly erratic.  Care was taken to protect this nerve.  The  tumor resection continued until the area of the porus could be  identified.  The tumor was noted to be entering the porus and at this  area, it seemed that the tumor itself was attached, as the dura itself  was very hemorrhagic.  Dissection occurred slowly in this area, but  ultimately the tumor was able to be released.  With the tumor protruding  into the porus, it was felt that the posteroinferior aspect of the porus  would need to be drilled so as to allow removal of this tumor.  It was  noted that the 7th and 8th nerve complex entered the porus in the  superior and ventral aspect of the porus.  Most of the tumor was in the  inferior and more dorsal aspect of the porus.  With the areas being  protected with cottonoid swabs, tumor was first resected as best  possible from the porus, then the posterior aspect of the porus was  drilled with a 2.3-mm dissecting tool.  A 2-mm Kerrison punch was then  used to remove a thin shelf of bone.  This then allowed a fairly sizable  piece of tumor to be resected from within the porus with some peel  attachments that were cauterized  and divided with micro scissors.  It  should be noted that the resection was done in 3 stages; I did most of  the 1st portion of the resection, with Dr. Vertell Limber observing, then the 2nd  portion of the resection down to the region of the porus was done mostly  by Dr. Vertell Limber and then the 3rd portion of resection in the porus itself  was performed by myself with Dr. Vertell Limber assisting.  This resection  proceeded in a slow and piecemeal fashion, with care being taken to  protect the 7th and 8th nerve complex. During the portion just prior to  the drilling of the porus, it was noted that the 8th nerve latency,  notably the 5th wave, became weaker in amplitude and the latency became  somewhat greater.  Despite relaxation of the retractors, it did not  appear to return; the 7th nerve stimulation, however, remained fairly  brisk throughout the dissection.  Ultimately, with the porus tumor being  resected, the area was inspected around the perimetry.  Some small bits  of tumor that were still attached to the dura were cauterized with the  bipolar cautery very carefully.  Care was taken to make sure that all  the nerves were well preserved and not manipulated too excessively.  The  brain stem retractor that was retracting both the cerebellum and the  edge of the brain stem was released.  The wound was inspected and  hemostasis from all the soft tissue sites was checked meticulously.  During this time, the patient also received Humate in a low dose during  the surgery and just as we were about to close, we gave an additional  high dose of Humate so as to minimize the chance for any postoperative  hemorrhage.  Again, the surgical sites were inspected for nearly 20  minutes while retractors were removed  and cottonoid pledgets were  removed and the surgical site was ascertained to be hemostatic.  With  this, the microscope was removed from the field and closure was  performed with loupe magnification using  4-0 Nurolon sutures to  reapproximate the dura.  The dural patch was fashioned using DuraGen,  sandwiched from the inside to the outside of the dural closure, and the  mastoid air cells that had been punctured during the performance of the  craniectomy were sealed with bone wax, first having removed any mucosa  with monopolar cautery.  The muscular layers were then closed in the  suboccipital craniectomy with 2-0 Vicryl in interrupted fashion.  The  fascia was closed with 2-0 Vicryl also and 3-0 Vicryl used in the  subcutaneous tissues and surgical staples were used in the skin.  Blood  loss for the procedure was estimated at 300 mL.  The total surgical time  was approximately 7 hours from the time of opening to the time of  closure.  The patient will be returned to the recovery room in stable  condition.      Earleen Newport, M.D.  Electronically Signed     HJE/MEDQ  D:  06/27/2007  T:  06/28/2007  Job:  [id number]

## 2011-01-10 NOTE — H&P (Signed)
NAMEJALAYNE, GANESH                ACCOUNT NO.:  [id number]   MEDICAL RECORD NO.:  [id number]          PATIENT TYPE:  INP   LOCATION:  3111                         FACILITY:  [facility]   PHYSICIAN:  Earleen Newport, M.D.  DATE OF BIRTH:  [birth year]   DATE OF ADMISSION:  06/27/2007  DATE OF DISCHARGE:                              HISTORY & PHYSICAL   ADMISSION DIAGNOSIS:  Right suboccipital cerebellopontine angle tumor.   HISTORY OF PRESENT ILLNESS:  Jakeira Seeman is a 50 year old right-handed  female who has had dizziness and unsteadiness with mild hearing loss and  unusual eye movements of increasing frequency for several months' time.  She had an extensive workup including ophthalmology review and workup by  ENT, and ultimately a CT and an MRI demonstrated the presence of a  posterior fossa mass in the right cerebellopontine angle.  Since her  initial presentation she notes that the usual movements have decreased.  She has had some modest hearing loss in the right ear.  However, the  lesion itself is rather large and prominent and appears that she has, on  her initial examination, experienced some weakness of the right facial  muscles.  All things being considered, it was advised that she should  undergo surgical resection of this lesion which was rather large,  measuring over 3 cm in size in an anterior-superior extent and about 1.5  to 2 cm in size in its width.  The patient is now being admitted to  undergo surgical extirpation of this lesion.   PAST MEDICAL HISTORY:  Reveals that she has a history of hypertension.  She had some ulcerative colitis; she is controlled with medication.  She  also has reflux.  She has had previous wisdom tooth removal in 1985 as  her only surgical procedure.  She notes that there are some bleeding  disorders in her family and she herself has noted a tendency towards  increased bleeding.  She has an allergy to ATENOLOL which causes  shortness of  breath.   CURRENT MEDICATIONS:  1. Bisoprolol fumarate 5 mg p.r.n. for blood pressure.  2. Asacol 400 mg six tablets a day for ulcerative colitis.  3. Nexium twice daily.   FAMILY HISTORY:  Notable that her mother had difficulties with  depression and dementia, heart disease, increased blood pressure and  stroke.  Father is age 36 and his history is unknown.  There was a  familial history of diabetes, increased blood pressure, stroke, heart  disease, mental illness and depression.  Family is concerned because  recent experience of memory loss that she has had over the past several  weeks, perhaps related to stress because of the recent diagnosis.  She  also notes that a twitchiness has increased in her body that would be  characterized as myoclonic jerks.   SOCIAL HISTORY:  Reveals that she does not smoke.  She does not drink  alcohol.  Her height and weight have been stable at 4 feet 11 inches and  162 pounds.   SYSTEMS REVIEW:  Notable for wearing of glasses, hearing loss, balance  disturbance, nasal congestion and drainage, inability to smell which has  been a lifelong process, sinus problems, chest pain, angina, high blood  pressure, irregular pulse with chronic cough, joint pain and swelling,  mild difficulties with speech, inability to concentrate, and depression,  in addition to the history of ulcerative colitis which was noted on a 14-  point review sheet.   PHYSICAL EXAMINATION:  Initially noted that the patient's pupils are 4  mm, briskly reactive to light and accommodation.  Extraocular movements  are full.  Face has a slight bit of weakness on the right nasolabial  fold particularly accentuated with grimace or vigorous smile.  Tongue  protruded in the midline.  The uvula is in the midline.  Upper  extremities and lower extremities normal function with normal gait.  Tandem gait is also intact.  Station is normal and Romberg test is  negative.  Deep tendon reflexes are  2+ in the biceps, 1+ in the triceps,  1+ in the brachioradialis, 2+ in the patellae, 1+ in the Achilles.  Babinski's are downgoing.  GENERAL PHYSICAL EXAM:  Reveals that the HEENT exam is as noted.  No  masses are noted in the neck.  No bruits are noted.  LUNGS:  Clear to auscultation.  HEART:  Has regular rate and rhythm.  No murmurs heard.  ABDOMEN:  Soft.  Bowel sounds positive.  No masses are palpable.  EXTREMITIES:  Reveal no cyanosis, clubbing or edema.   IMPRESSION:  The patient has evidence of a mass in the right  cerebellopontine angle.  She is now being admitted to undergo surgical  resection.  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  She  will be treated with DDAVP during the surgery.  She has been evaluated  for this process by Dr. Jamse Arn.  The patient is understanding of the  risks of the surgery including but not limited to complete hearing loss  in the right ear, facial weakness which may be partial or complete in  the right face, the risk of stroke and paralysis have all been discussed  and explained in detail.      Earleen Newport, M.D.  Electronically Signed     HJE/MEDQ  D:  06/28/2007  T:  06/28/2007  Job:  [id number]

## 2011-01-12 ENCOUNTER — Other Ambulatory Visit: Payer: BC Managed Care – PPO | Admitting: Internal Medicine

## 2011-01-13 NOTE — Consult Note (Signed)
NAMESHARYON, PEITZ NO.:  [id number]   MEDICAL RECORD NO.:  [id number]           PATIENT TYPE:   LOCATION:                               FACILITY:  [facility]   PHYSICIAN:  Fabio Asa, M.D.    DATE OF BIRTH:  [birth year]   DATE OF CONSULTATION:  DATE OF DISCHARGE:                                   CONSULTATION   REFERRING PHYSICIAN:  Dr. Rogene Houston.   INDICATION FOR CONSULT:  Chest pain.   HISTORY:  Lauren [name] is a very pleasant 50 year old female previously followed by  Dr. Daneen Schick, last office visit was in 2002, she had previously been  evaluated for chest pain and tachy arrhythmia.  She underwent a stress  Cardiolite at this time there was no evidence of inducible ischemia, she had  normal wall motion with an ejection fraction of 71%, she has not had  followup in the past four years, she does note that she had an episode of  supraventricular tachycardia two weeks prior and was evaluated in the  emergency room and discharged to home.   The initial onset of chest pain was described to occur at 3 a.m. in the  morning while at rest, it was described as 6/10, and she woke up at 4  o'clock in the morning and took a Nexium, she went on to work which is a  sedentary job, the chest pain eased off somewhat while at work, but  persisted therefore the patient presented to the emergency room.  Since  arrival in the emergency room the pain has eased off, so now that she  describes it as a light sensation.  She is not involved in a regular  exercise program, her most strenuous activity is described as walking.  Her  coronary risk factors are significant for hypertension.   SIGNIFICANT PAST MEDICAL HISTORY:  1.  Reflux disorder.  2.  Ulcerative colitis.  3.  Hypertension.   PAST SURGICAL HISTORY:  None.   CURRENT MEDICATIONS:  1.  Bisoprolol hydrochlorothiazide 37.5/25.  2.  Nexium 40 mg daily.  3.  Asacol 400 mg daily.   ALLERGIES:  ATENOLOL WHICH RESULTED IN  SHORTNESS OF BREATH, RASH.   FAMILY HISTORY:  Mother passed from probable cerebrovascular accident, and  father is in his 31s and is alive and well.   SOCIAL HISTORY:  No history of tobacco use, alcohol use or illicit drug use.  She is married and lives with her husband, works in a sedentary position.  She has one child.   REVIEW OF SYSTEMS:  She notes slight lightheadedness which has been  attributed to her beta-blockers.   PHYSICAL EXAMINATION:  VITAL SIGNS:  Her blood pressure is 127/80, heart  rate is 55, she is afebrile, O2 sat is 98%.  GENERAL:  Reveals a middle-aged female in no distress.  HEENT:  Unremarkable, she has good carotid upstrokes, no carotid bruits.  PULMONARY:  Exam reveals breath sounds which are equal and clear to  auscultation, no use of accessory muscles.  CARDIOVASCULAR:  Exam reveals normal S1, normal S2, regular rate and  rhythm,  no rubs, murmurs or gallops noted.  ABDOMEN:  Soft, benign and nontender.  EXTREMITIES:  Reveal no peripheral edema.  SKIN:  Warm and dry.  NEURO:  Nonfocal.   Sodium is 139, potassium 3.4, BUN 9, glucose 83, pH 7.38, bicarbonate 30,  hematocrit 43, hemoglobin 14, creatinine 0.8, serial markers negative x3,  chest x-ray reveals no acute disease.  Her ECG reveals a normal sinus rhythm  to sinus bradycardia, she has normal R wave progression, there are no ST  changes noted.   IMPRESSION:  1.  A 50 year old female with onset chest pain occurring at rest, has waxed      and waned for most of the morning, has now resolved.  Options of      inpatient risk stratification versus outpatient risk stratification was      discussed with the patient and she wishes to proceed with outpatient      stratification.  She has been instructed to avoid activity.  She will      initiate aspirin 81 mg daily.  She will take sublingual nitroglycerin.      She will follow-up with Dr. Daneen Schick as an outpatient for a stress      Cardiolite.   Suspected her chest pain may be more related to reflux      disease.  2.  Hypertension.  Blood pressure is well controlled.  3.  Ulcerative colitis.  She will continue with Asacol 400 mg daily.      Fabio Asa, M.D.  Electronically Signed     HP/MEDQ  D:  05/05/2005  T:  05/05/2005  Job:  [id number]   cc:   Belva Crome, M.D.  Fax: [phone]

## 2011-01-16 ENCOUNTER — Other Ambulatory Visit: Payer: Self-pay | Admitting: Internal Medicine

## 2011-01-24 ENCOUNTER — Other Ambulatory Visit (INDEPENDENT_AMBULATORY_CARE_PROVIDER_SITE_OTHER): Payer: BC Managed Care – PPO

## 2011-01-24 DIAGNOSIS — K5190 Ulcerative colitis, unspecified, without complications: Secondary | ICD-10-CM

## 2011-01-24 DIAGNOSIS — D680 Von Willebrand's disease: Secondary | ICD-10-CM

## 2011-01-24 LAB — COMPREHENSIVE METABOLIC PANEL
ALT: 15 U/L (ref 0–35)
AST: 21 U/L (ref 0–37)
Albumin: 4.1 g/dL (ref 3.5–5.2)
Alkaline Phosphatase: 49 U/L (ref 39–117)
BUN: 9 mg/dL (ref 6–23)
CO2: 32 mEq/L (ref 19–32)
Calcium: 9.5 mg/dL (ref 8.4–10.5)
Chloride: 102 mEq/L (ref 96–112)
Creatinine, Ser: 0.6 mg/dL (ref 0.4–1.2)
GFR: 104.44 mL/min (ref >60.00)
Glucose, Bld: 86 mg/dL (ref 70–99)
Potassium: 4 mEq/L (ref 3.5–5.1)
Sodium: 141 mEq/L (ref 135–145)
Total Bilirubin: 1.2 mg/dL (ref 0.3–1.2)
Total Protein: 7.3 g/dL (ref 6.0–8.3)

## 2011-01-24 LAB — BASIC METABOLIC PANEL
BUN: 9 mg/dL (ref 6–23)
CO2: 31 mEq/L (ref 19–32)
Calcium: 9.5 mg/dL (ref 8.4–10.5)
Chloride: 100 mEq/L (ref 96–112)
Creatinine, Ser: 0.7 mg/dL (ref 0.4–1.2)
GFR: 92.65 mL/min (ref >60.00)
Glucose, Bld: 87 mg/dL (ref 70–99)
Potassium: 4 mEq/L (ref 3.5–5.1)
Sodium: 140 mEq/L (ref 135–145)

## 2011-01-24 LAB — CBC WITH DIFFERENTIAL/PLATELET
Basophils Absolute: 0 10*3/uL (ref 0.0–0.1)
Basophils Relative: 0.2 % (ref 0.0–3.0)
Eosinophils Absolute: 0 10*3/uL (ref 0.0–0.7)
Eosinophils Relative: 0.6 % (ref 0.0–5.0)
HCT: 42.2 % (ref 36.0–46.0)
Hemoglobin: 14.7 g/dL (ref 12.0–15.0)
Lymphocytes Relative: 26.5 % (ref 12.0–46.0)
Lymphs Abs: 1.4 10*3/uL (ref 0.7–4.0)
MCHC: 34.9 g/dL (ref 30.0–36.0)
MCV: 96.1 fl (ref 78.0–100.0)
Monocytes Absolute: 0.4 10*3/uL (ref 0.1–1.0)
Monocytes Relative: 8.4 % (ref 3.0–12.0)
Neutro Abs: 3.4 10*3/uL (ref 1.4–7.7)
Neutrophils Relative %: 64.3 % (ref 43.0–77.0)
Platelets: 237 10*3/uL (ref 150.0–400.0)
RBC: 4.39 Mil/uL (ref 3.87–5.11)
RDW: 14.7 % — ABNORMAL HIGH (ref 11.5–14.6)
WBC: 5.2 10*3/uL (ref 4.5–10.5)

## 2011-01-24 LAB — APTT: aPTT: 28.2 s (ref 21.7–28.8)

## 2011-01-25 ENCOUNTER — Other Ambulatory Visit: Payer: Self-pay | Admitting: Internal Medicine

## 2011-01-25 ENCOUNTER — Ambulatory Visit (HOSPITAL_COMMUNITY)
Admission: RE | Admit: 2011-01-25 | Discharge: 2011-01-25 | Disposition: A | Discharge status: Home / Self Care | Payer: BC Managed Care – PPO | Source: Ambulatory Visit | Attending: Internal Medicine | Admitting: Internal Medicine

## 2011-01-25 ENCOUNTER — Other Ambulatory Visit: Payer: BC Managed Care – PPO | Admitting: Internal Medicine

## 2011-01-25 DIAGNOSIS — R197 Diarrhea, unspecified: Secondary | ICD-10-CM | POA: Insufficient documentation

## 2011-01-25 DIAGNOSIS — K921 Melena: Secondary | ICD-10-CM

## 2011-01-25 DIAGNOSIS — D126 Benign neoplasm of colon, unspecified: Secondary | ICD-10-CM | POA: Insufficient documentation

## 2011-01-25 DIAGNOSIS — K5190 Ulcerative colitis, unspecified, without complications: Secondary | ICD-10-CM | POA: Insufficient documentation

## 2011-01-25 DIAGNOSIS — Z79899 Other long term (current) drug therapy: Secondary | ICD-10-CM | POA: Insufficient documentation

## 2011-01-25 DIAGNOSIS — K5150 Left sided colitis without complications: Secondary | ICD-10-CM

## 2011-01-25 DIAGNOSIS — K648 Other hemorrhoids: Secondary | ICD-10-CM | POA: Insufficient documentation

## 2011-01-26 ENCOUNTER — Other Ambulatory Visit (INDEPENDENT_AMBULATORY_CARE_PROVIDER_SITE_OTHER): Payer: BC Managed Care – PPO

## 2011-01-26 ENCOUNTER — Telehealth: Payer: Self-pay | Admitting: *Deleted

## 2011-01-26 DIAGNOSIS — K5190 Ulcerative colitis, unspecified, without complications: Secondary | ICD-10-CM

## 2011-01-26 LAB — BASIC METABOLIC PANEL
BUN: 11 mg/dL (ref 6–23)
CO2: 32 mEq/L (ref 19–32)
Calcium: 9.4 mg/dL (ref 8.4–10.5)
Chloride: 100 mEq/L (ref 96–112)
Creatinine, Ser: 0.7 mg/dL (ref 0.4–1.2)
GFR: 95.75 mL/min (ref >60.00)
Glucose, Bld: 77 mg/dL (ref 70–99)
Potassium: 4 mEq/L (ref 3.5–5.1)
Sodium: 138 mEq/L (ref 135–145)

## 2011-01-26 NOTE — Telephone Encounter (Signed)
Patient came for labs this morning as instructed. Patient states that she normally has labs 12-24 hours AFTER stimate has been given. Dr Dalene Carrow wrote in her orders that she wanted patient to have stimate 24 hours after procedure and a BMET 24 hours after her procedure.  I have spoken to Dr Lillie Columbia herself to clarify her orders due to patient concern. Dr Lillie Columbia states that patient does not need any further labs after BMET this AM. She states that she could have labs 2 days after if she would like but that it is not really necessary. She also states that patient only needs to restrict water intake x 2 days or so. I have advised the patient of this and she verbalizes understanding.

## 2011-01-29 ENCOUNTER — Encounter: Payer: Self-pay | Admitting: Internal Medicine

## 2011-01-30 ENCOUNTER — Telehealth: Payer: Self-pay | Admitting: *Deleted

## 2011-01-30 NOTE — Telephone Encounter (Signed)
Message copied by Daphine Deutscher on Mon [name] 30, 2011 10:38 AM ------      Message from: Hart Carwin      Created: Sun [name] 29, 2011  9:02 AM       Please call pt with normal electrolytes following her colonoscopy.

## 2011-01-30 NOTE — Telephone Encounter (Signed)
Patient notified of results.

## 2011-01-30 NOTE — Telephone Encounter (Signed)
Message copied by Daphine Deutscher on Mon [name] 30, 2011 10:34 AM ------      Message from: Hart Carwin      Created: Sun [name] 29, 2011  9:02 AM       Please call pt with normal electrolytes following her colonoscopy.

## 2011-01-31 ENCOUNTER — Encounter: Payer: Self-pay | Admitting: Internal Medicine

## 2011-02-06 ENCOUNTER — Telehealth: Payer: Self-pay | Admitting: Internal Medicine

## 2011-02-06 MED ORDER — INFLIXIMAB 100 MG IV SOLR: INTRAVENOUS | Status: DC

## 2011-02-06 NOTE — Telephone Encounter (Signed)
Orders faxed to short stay. 

## 2011-02-09 ENCOUNTER — Telehealth: Payer: Self-pay

## 2011-02-09 MED ORDER — INFLIXIMAB 100 MG IV SOLR: INTRAVENOUS | Status: DC

## 2011-02-09 NOTE — Telephone Encounter (Signed)
They need updated orders for the patient's

## 2011-02-10 ENCOUNTER — Encounter (HOSPITAL_COMMUNITY): Payer: BC Managed Care – PPO | Attending: Internal Medicine

## 2011-02-10 DIAGNOSIS — K5190 Ulcerative colitis, unspecified, without complications: Secondary | ICD-10-CM | POA: Insufficient documentation

## 2011-02-15 ENCOUNTER — Other Ambulatory Visit: Payer: Self-pay | Admitting: Internal Medicine

## 2011-02-16 ENCOUNTER — Other Ambulatory Visit: Payer: Self-pay | Admitting: Internal Medicine

## 2011-02-20 ENCOUNTER — Other Ambulatory Visit: Payer: Self-pay | Admitting: Internal Medicine

## 2011-03-22 ENCOUNTER — Telehealth: Payer: Self-pay | Admitting: *Deleted

## 2011-03-22 NOTE — Telephone Encounter (Signed)
Needed f/u appt post COLON; scheduled for 04/21/11 at 10:15am.

## 2011-03-27 ENCOUNTER — Other Ambulatory Visit: Payer: Self-pay | Admitting: Internal Medicine

## 2011-04-07 ENCOUNTER — Encounter (HOSPITAL_COMMUNITY): Payer: BC Managed Care – PPO | Attending: Internal Medicine

## 2011-04-07 DIAGNOSIS — K5190 Ulcerative colitis, unspecified, without complications: Secondary | ICD-10-CM | POA: Insufficient documentation

## 2011-04-21 ENCOUNTER — Encounter: Payer: Self-pay | Admitting: Internal Medicine

## 2011-04-21 ENCOUNTER — Other Ambulatory Visit (INDEPENDENT_AMBULATORY_CARE_PROVIDER_SITE_OTHER): Payer: BC Managed Care – PPO

## 2011-04-21 ENCOUNTER — Ambulatory Visit (INDEPENDENT_AMBULATORY_CARE_PROVIDER_SITE_OTHER): Payer: BC Managed Care – PPO | Admitting: Internal Medicine

## 2011-04-21 VITALS — BP 100/62 | HR 74 | Ht 59.0 in | Wt 180.0 lb

## 2011-04-21 DIAGNOSIS — K5190 Ulcerative colitis, unspecified, without complications: Secondary | ICD-10-CM

## 2011-04-21 DIAGNOSIS — K5150 Left sided colitis without complications: Secondary | ICD-10-CM

## 2011-04-21 DIAGNOSIS — K648 Other hemorrhoids: Secondary | ICD-10-CM

## 2011-04-21 DIAGNOSIS — D849 Immunodeficiency, unspecified: Secondary | ICD-10-CM

## 2011-04-21 LAB — CBC WITH DIFFERENTIAL/PLATELET
Basophils Absolute: 0 10*3/uL (ref 0.0–0.1)
Basophils Relative: 0.3 % (ref 0.0–3.0)
Eosinophils Absolute: 0 10*3/uL (ref 0.0–0.7)
Eosinophils Relative: 0.6 % (ref 0.0–5.0)
HCT: 43.5 % (ref 36.0–46.0)
Hemoglobin: 14.7 g/dL (ref 12.0–15.0)
Lymphocytes Relative: 18.4 % (ref 12.0–46.0)
Lymphs Abs: 1 10*3/uL (ref 0.7–4.0)
MCHC: 33.7 g/dL (ref 30.0–36.0)
MCV: 94.8 fl (ref 78.0–100.0)
Monocytes Absolute: 0.4 10*3/uL (ref 0.1–1.0)
Monocytes Relative: 7.6 % (ref 3.0–12.0)
Neutro Abs: 3.9 10*3/uL (ref 1.4–7.7)
Neutrophils Relative %: 73.1 % (ref 43.0–77.0)
Platelets: 214 10*3/uL (ref 150.0–400.0)
RBC: 4.59 Mil/uL (ref 3.87–5.11)
RDW: 14.7 % — ABNORMAL HIGH (ref 11.5–14.6)
WBC: 5.3 10*3/uL (ref 4.5–10.5)

## 2011-04-21 LAB — COMPREHENSIVE METABOLIC PANEL
ALT: 17 U/L (ref 0–35)
AST: 21 U/L (ref 0–37)
Albumin: 4.2 g/dL (ref 3.5–5.2)
Alkaline Phosphatase: 55 U/L (ref 39–117)
BUN: 14 mg/dL (ref 6–23)
CO2: 29 mEq/L (ref 19–32)
Calcium: 9.2 mg/dL (ref 8.4–10.5)
Chloride: 102 mEq/L (ref 96–112)
Creatinine, Ser: 0.7 mg/dL (ref 0.4–1.2)
GFR: 97.29 mL/min (ref >60.00)
Glucose, Bld: 90 mg/dL (ref 70–99)
Potassium: 3.7 mEq/L (ref 3.5–5.1)
Sodium: 139 mEq/L (ref 135–145)
Total Bilirubin: 0.6 mg/dL (ref 0.3–1.2)
Total Protein: 7.3 g/dL (ref 6.0–8.3)

## 2011-04-21 LAB — IBC PANEL
Iron: 114 ug/dL (ref 42–145)
Saturation Ratios: 35.2 % (ref 20.0–50.0)
Transferrin: 231.3 mg/dL (ref 212.0–360.0)

## 2011-04-21 LAB — VITAMIN B12: Vitamin B-12: 524 pg/mL (ref 211–911)

## 2011-04-21 MED ORDER — PREDNISONE 1 MG PO TABS: ORAL_TABLET | ORAL | Status: DC

## 2011-04-21 MED ORDER — HYDROCORTISONE ACETATE 25 MG RE SUPP: RECTAL | Status: DC

## 2011-04-21 NOTE — Progress Notes (Signed)
Lauren [name] 08-23-1961 MRN [id number]     History of Present Illness:  This is a 50 year old white female with ulcerative colitis of at least 10 years duration which is slowly coming into remission. She is on a prednisone taper. She is currently down to 3 mg a day. She is on Remicade 5 mg/kg every 8 weeks, Imuran 100 mg daily and mesalamine 4.8 g daily. She still has 2-3 bowel movements a day but they are soft and there has been no blood. She has not taken any antispasmodics. Her last flexible sigmoidoscopy in November 2010 and colonoscopy in June 2012 showed active colitis from 0-30 cm with mild exudate and granularity. The biopsies of the cecum, hepatic flexure and splenic flexure showed normal mucosa. The biopsies in the left colon showed chronic active colitis with glandular atypia consistent with IBD. There was an inflammatory polyp at 15 cm.   Past Medical History  Diagnosis Date  . Ulcerative colitis   . SVT (supraventricular tachycardia)   . Personal history of meningioma of the brain   . Von Willebrands disease   . Anxiety and depression   . Vitamin B12 deficiency   . GERD (gastroesophageal reflux disease)   . External hemorrhoids   . Tennis elbow    Past Surgical History  Procedure Date  . Brain meningioma excision 05/2007  . Implantation bone anchored hearing aid   . Cardiac electrophysiology study and ablation 10/22/09    reports that she has never smoked. She has never used smokeless tobacco. She reports that she does not drink alcohol or use illicit drugs. family history includes Diabetes in her maternal uncle; Heart disease in her maternal grandfather and mother; and Stroke in her mother.  There is no history of Colon cancer. Allergies  Allergen Reactions  . Atenolol   . Cephalexin         Review of Systems: Denies upper GI symptoms of reflux shortness or breath chest pain, weight gain of about 8 pounds  The remainder of the 10  point ROS is negative except as  outlined in H&P   Physical Exam: General appearance  Well developed, in no distress. Eyes- non icteric. HEENT nontraumatic, normocephalic. Mouth no lesions, tongue papillated, no cheilosis. Neck supple without adenopathy, thyroid not enlarged, no carotid bruits, no JVD. Lungs Clear to auscultation bilaterally. Cor normal S1 normal S2, regular rhythm , no murmur,  quiet precordium. Abdomen soft nontender abdomen with normal active bowel sounds. No distention. No hernia. Liver edge at costal margin. Rectal soft Hemoccult negative stool. Extremities no pedal edema. Skin no lesions. Neurological alert and oriented x 3. Psychological normal mood and affect.  Assessment and Plan:  Problem #1 Ulcerative colitis in remission. Patient will continue all medications and continue a prednisone taper. She will decrease her prednisone by 1 mg every 3 weeks so she will be completely off prednisone in the next 2 months. I will see her in 3 months. Today, her lab work will include B12, iron, blood count and metabolic panel. She needs a refill for her Anusol-HC suppositories twice a day.    04/21/2011 Lauren [name]

## 2011-04-21 NOTE — Patient Instructions (Signed)
We have sent the following medications to your pharmacy for you to pick up at your convenience: Anusol Suppositories Prednisone 1 mg. Your physician has requested that you go to the basement for the following lab work before leaving today: CBC, CMET, B12, IBC Panel CC: Dr Earl Lites

## 2011-04-22 ENCOUNTER — Other Ambulatory Visit: Payer: Self-pay | Admitting: Internal Medicine

## 2011-04-24 ENCOUNTER — Telehealth: Payer: Self-pay | Admitting: *Deleted

## 2011-04-24 NOTE — Telephone Encounter (Signed)
Message copied by Daphine Deutscher on Mon Apr 24, 2011 11:55 AM ------      Message from: Hart Carwin      Created: Sat Apr 22, 2011 11:16 PM       Please call pt with all blood tests being normal.

## 2011-04-24 NOTE — Telephone Encounter (Signed)
Spoke with patient and gave her lab results.

## 2011-05-25 LAB — BASIC METABOLIC PANEL
BUN: 10
CO2: 27
Calcium: 9.3
Chloride: 101
Creatinine, Ser: 0.6
GFR calc Af Amer: >60
GFR calc non Af Amer: >60
Glucose, Bld: 85
Potassium: 3.2 — ABNORMAL LOW
Sodium: 138

## 2011-05-25 LAB — CBC
HCT: 41.5
Hemoglobin: 14.2
MCHC: 34.2
MCV: 87.4
Platelets: 223
RBC: 4.75
RDW: 13.6
WBC: 5.9

## 2011-05-29 ENCOUNTER — Telehealth: Payer: Self-pay | Admitting: *Deleted

## 2011-05-29 ENCOUNTER — Telehealth: Payer: Self-pay | Admitting: Internal Medicine

## 2011-05-29 MED ORDER — PREDNISONE 1 MG PO TABS: ORAL_TABLET | ORAL | Status: DC

## 2011-05-29 MED ORDER — INFLIXIMAB 100 MG IV SOLR: INTRAVENOUS | Status: DC

## 2011-05-29 NOTE — Telephone Encounter (Signed)
Orders faxed to [facility] short stay.  

## 2011-05-29 NOTE — Telephone Encounter (Signed)
Per Dr. Juanda Chance, needs a refill for Prednisone 0.5 mg #50 Take as directed. Rx sent

## 2011-05-31 LAB — CLOTEST (H. PYLORI), BIOPSY: Helicobacter screen: NEGATIVE

## 2011-06-02 ENCOUNTER — Encounter (HOSPITAL_COMMUNITY): Payer: BC Managed Care – PPO | Attending: Internal Medicine

## 2011-06-02 DIAGNOSIS — K5190 Ulcerative colitis, unspecified, without complications: Secondary | ICD-10-CM | POA: Insufficient documentation

## 2011-06-06 LAB — BASIC METABOLIC PANEL
BUN: 10
BUN: 11
BUN: 7
CO2: 25
CO2: 26
CO2: 26
Calcium: 7.4 — ABNORMAL LOW
Calcium: 7.7 — ABNORMAL LOW
Calcium: 8.4
Chloride: 105
Chloride: 94 — ABNORMAL LOW
Chloride: 99
Creatinine, Ser: 0.49
Creatinine, Ser: 0.54
Creatinine, Ser: 0.62
GFR calc Af Amer: >60
GFR calc Af Amer: >60
GFR calc Af Amer: >60
GFR calc non Af Amer: >60
GFR calc non Af Amer: >60
GFR calc non Af Amer: >60
Glucose, Bld: 104 — ABNORMAL HIGH
Glucose, Bld: 124 — ABNORMAL HIGH
Glucose, Bld: 127 — ABNORMAL HIGH
Potassium: 3.9
Potassium: 4.1
Potassium: 4.1
Sodium: 128 — ABNORMAL LOW
Sodium: 129 — ABNORMAL LOW
Sodium: 138

## 2011-06-06 LAB — CBC
HCT: 42.7
Hemoglobin: 14.4
MCHC: 33.8
MCV: 88.8
Platelets: 244
RBC: 4.81
RDW: 13.8
WBC: 14.4 — ABNORMAL HIGH

## 2011-06-07 LAB — TYPE AND SCREEN
ABO/RH(D): A POS
Antibody Screen: NEGATIVE

## 2011-06-07 LAB — ABO/RH: ABO/RH(D): A POS

## 2011-06-07 LAB — BASIC METABOLIC PANEL
BUN: 13
BUN: 6
BUN: 6
BUN: 9
CO2: 17 — ABNORMAL LOW
CO2: 21
CO2: 23
CO2: 29
Calcium: 6.6 — ABNORMAL LOW
Calcium: 7.1 — ABNORMAL LOW
Calcium: 7.4 — ABNORMAL LOW
Calcium: 9.5
Chloride: 100
Chloride: 102
Chloride: 104
Chloride: 106
Creatinine, Ser: 0.55
Creatinine, Ser: 0.59
Creatinine, Ser: 0.6
Creatinine, Ser: 0.72
GFR calc Af Amer: >60
GFR calc Af Amer: >60
GFR calc Af Amer: >60
GFR calc Af Amer: >60
GFR calc non Af Amer: >60
GFR calc non Af Amer: >60
GFR calc non Af Amer: >60
GFR calc non Af Amer: >60
Glucose, Bld: 143 — ABNORMAL HIGH
Glucose, Bld: 147 — ABNORMAL HIGH
Glucose, Bld: 164 — ABNORMAL HIGH
Glucose, Bld: 82
Potassium: 3.1 — ABNORMAL LOW
Potassium: 3.3 — ABNORMAL LOW
Potassium: 3.3 — ABNORMAL LOW
Potassium: 3.4 — ABNORMAL LOW
Sodium: 131 — ABNORMAL LOW
Sodium: 132 — ABNORMAL LOW
Sodium: 132 — ABNORMAL LOW
Sodium: 137

## 2011-06-07 LAB — CBC
HCT: 35.7 — ABNORMAL LOW
HCT: 36.3
HCT: 42.3
Hemoglobin: 12
Hemoglobin: 12.6
Hemoglobin: 14.4
MCHC: 33.7
MCHC: 34
MCHC: 34.8
MCV: 86.9
MCV: 87.9
MCV: 89
Platelets: 188
Platelets: 196
Platelets: 250
RBC: 4.01
RBC: 4.17
RBC: 4.81
RDW: 13.4
RDW: 13.4
RDW: 13.5
WBC: 17.7 — ABNORMAL HIGH
WBC: 6.7
WBC: 8

## 2011-06-07 LAB — URINALYSIS, ROUTINE W REFLEX MICROSCOPIC
Bilirubin Urine: NEGATIVE
Glucose, UA: NEGATIVE
Hgb urine dipstick: NEGATIVE
Ketones, ur: NEGATIVE
Nitrite: NEGATIVE
Protein, ur: NEGATIVE
Specific Gravity, Urine: 1.012
Urobilinogen, UA: 0.2
pH: 7

## 2011-06-07 LAB — POCT I-STAT 7, (LYTES, BLD GAS, ICA,H+H)
Acid-base deficit: 1
Bicarbonate: 23.6
Calcium, Ion: 1.09 — ABNORMAL LOW
HCT: 36
Hemoglobin: 12.2
O2 Saturation: 100
Operator id: 237591
Patient temperature: 35.6
Potassium: 3.1 — ABNORMAL LOW
Sodium: 136
TCO2: 25
pCO2 arterial: 35.7
pH, Arterial: 7.422 — ABNORMAL HIGH
pO2, Arterial: 437 — ABNORMAL HIGH

## 2011-06-07 LAB — POCT I-STAT 4, (NA,K, GLUC, HGB,HCT)
Glucose, Bld: 140 — ABNORMAL HIGH
Glucose, Bld: 141 — ABNORMAL HIGH
Glucose, Bld: 142 — ABNORMAL HIGH
Glucose, Bld: 162 — ABNORMAL HIGH
HCT: 31 — ABNORMAL LOW
HCT: 33 — ABNORMAL LOW
HCT: 35 — ABNORMAL LOW
HCT: 36
Hemoglobin: 10.5 — ABNORMAL LOW
Hemoglobin: 11.2 — ABNORMAL LOW
Hemoglobin: 11.9 — ABNORMAL LOW
Hemoglobin: 12.2
Operator id: 147011
Operator id: 147011
Operator id: 147011
Operator id: 147011
Potassium: 2.9 — ABNORMAL LOW
Potassium: 3.1 — ABNORMAL LOW
Potassium: 3.2 — ABNORMAL LOW
Potassium: 3.3 — ABNORMAL LOW
Sodium: 135
Sodium: 136
Sodium: 136
Sodium: 137

## 2011-06-07 LAB — PROTIME-INR
INR: 1.1
INR: 1.1
Prothrombin Time: 14
Prothrombin Time: 14.6

## 2011-06-07 LAB — APTT
aPTT: 25
aPTT: 26

## 2011-06-07 LAB — URINE MICROSCOPIC-ADD ON

## 2011-06-07 LAB — POTASSIUM: Potassium: 3.6

## 2011-06-07 LAB — FACTOR 8 ASSAY: Coagulation Factor VIII: 152 — ABNORMAL HIGH

## 2011-06-14 ENCOUNTER — Other Ambulatory Visit: Payer: Self-pay | Admitting: Obstetrics and Gynecology

## 2011-06-14 DIAGNOSIS — Z1231 Encounter for screening mammogram for malignant neoplasm of breast: Secondary | ICD-10-CM

## 2011-06-22 ENCOUNTER — Ambulatory Visit
Admission: RE | Admit: 2011-06-22 | Discharge: 2011-06-22 | Disposition: A | Discharge status: Home / Self Care | Payer: BC Managed Care – PPO | Source: Ambulatory Visit | Attending: Obstetrics and Gynecology | Admitting: Obstetrics and Gynecology

## 2011-06-22 DIAGNOSIS — Z1231 Encounter for screening mammogram for malignant neoplasm of breast: Secondary | ICD-10-CM

## 2011-07-11 ENCOUNTER — Ambulatory Visit (INDEPENDENT_AMBULATORY_CARE_PROVIDER_SITE_OTHER): Payer: BC Managed Care – PPO | Admitting: Internal Medicine

## 2011-07-11 ENCOUNTER — Encounter: Payer: Self-pay | Admitting: Internal Medicine

## 2011-07-11 ENCOUNTER — Other Ambulatory Visit (INDEPENDENT_AMBULATORY_CARE_PROVIDER_SITE_OTHER): Payer: BC Managed Care – PPO

## 2011-07-11 ENCOUNTER — Telehealth: Payer: Self-pay | Admitting: *Deleted

## 2011-07-11 VITALS — BP 140/90 | HR 68 | Ht 59.0 in | Wt 181.4 lb

## 2011-07-11 DIAGNOSIS — K5190 Ulcerative colitis, unspecified, without complications: Secondary | ICD-10-CM

## 2011-07-11 DIAGNOSIS — K648 Other hemorrhoids: Secondary | ICD-10-CM

## 2011-07-11 LAB — CBC WITH DIFFERENTIAL/PLATELET
Basophils Absolute: 0 10*3/uL (ref 0.0–0.1)
Basophils Relative: 0.4 % (ref 0.0–3.0)
Eosinophils Absolute: 0.1 10*3/uL (ref 0.0–0.7)
Eosinophils Relative: 1.9 % (ref 0.0–5.0)
HCT: 41.8 % (ref 36.0–46.0)
Hemoglobin: 14.2 g/dL (ref 12.0–15.0)
Lymphocytes Relative: 28.1 % (ref 12.0–46.0)
Lymphs Abs: 1.4 10*3/uL (ref 0.7–4.0)
MCHC: 34 g/dL (ref 30.0–36.0)
MCV: 94.7 fl (ref 78.0–100.0)
Monocytes Absolute: 0.5 10*3/uL (ref 0.1–1.0)
Monocytes Relative: 9.4 % (ref 3.0–12.0)
Neutro Abs: 3 10*3/uL (ref 1.4–7.7)
Neutrophils Relative %: 60.2 % (ref 43.0–77.0)
Platelets: 243 10*3/uL (ref 150.0–400.0)
RBC: 4.42 Mil/uL (ref 3.87–5.11)
RDW: 13.9 % (ref 11.5–14.6)
WBC: 5.1 10*3/uL (ref 4.5–10.5)

## 2011-07-11 MED ORDER — PREDNISONE 1 MG PO TABS: ORAL_TABLET | ORAL | Status: DC

## 2011-07-11 MED ORDER — HYDROCORTISONE 100 MG/60ML RE ENEM: ENEMA | RECTAL | Status: DC

## 2011-07-11 NOTE — Patient Instructions (Signed)
Your physician has requested that you go to the basement for the following lab work before leaving today: CBC, BMET We have sent the following medications to your pharmacy for you to pick up at your convenience: Cort Enemas #30 . Insert and retain 1 enema every night x 1 week, then 1 enema every other night x 1 week, then 1 enema 3 times per week x 1 week, then 2 times per week x 1 week, then once per week x 1 week, then discontinue, Begin Hydrocortisone suppositories after completions of Cort Enemas. Dr Juanda Chance has advised that you be on a prednisone taper. The taper instructions are as follows: Prednisone 3 mg daily x 4 weeks. Please follow up with Dr Juanda Chance in 4 weeks. CC: Dr Earl Lites

## 2011-07-11 NOTE — Telephone Encounter (Signed)
Yes, we made her  OV for today.

## 2011-07-11 NOTE — Progress Notes (Signed)
Lauren [name] 1960-10-26 MRN [id number]    History of Present Illness:  This is a 50 year old white female with ulcerative colitis for greater than 10 years duration. Her last office visit was on 04/21/2011. She has been on a very slow prednisone taper. The last dose was 1 mg every other day. She has noticed increased blood per rectum, mucus and some diarrhea. She has continued on Remicade 5 mg/kg every 8 weeks and Imuran 100 mg daily. She has also been on mesalamine 4.8 g daily. Her last full colonoscopy in June 2012 showed active colitis from 0-30 cm.   Past Medical History  Diagnosis Date  . Ulcerative colitis   . SVT (supraventricular tachycardia)   . Personal history of meningioma of the brain   . Von Willebrands disease   . Anxiety and depression   . Vitamin B12 deficiency   . GERD (gastroesophageal reflux disease)   . External hemorrhoids   . Tennis elbow    Past Surgical History  Procedure Date  . Brain meningioma excision 05/2007  . Implantation bone anchored hearing aid   . Cardiac electrophysiology study and ablation 10/22/09    reports that she has never smoked. She has never used smokeless tobacco. She reports that she does not drink alcohol or use illicit drugs. family history includes Diabetes in her maternal uncle; Heart disease in her maternal grandfather and mother; and Stroke in her mother.  There is no history of Colon cancer. Allergies  Allergen Reactions  . Atenolol   . Cephalexin         Review of Systems: Denies any active reflux. Denies any significant abdominal pain fever or shortness of breath  The remainder of the 10 point ROS is negative except as outlined in H&P   Physical Exam: General appearance  Well developed, in no distress. Eyes- non icteric. HEENT nontraumatic, normocephalic. Mouth no lesions, tongue papillated, no cheilosis. Neck supple without adenopathy, thyroid not enlarged, no carotid bruits, no JVD. Lungs Clear to auscultation  bilaterally. Cor normal S1, normal S2, regular rhythm, no murmur,  quiet precordium. Abdomen: Soft nontender abdomen with normoactive bowel sounds. No distention. Liver at costal margin. Rectal: and anoscopic exam reveals active internal hemorrhoids circumferentially. No active bleeding but they appeared quite hyperemic. Rectal ampulla itself appears erythematous but there is no friability, no bleeding or exudate. Extremities no pedal edema. Skin no lesions. Neurological alert and oriented x 3. Psychological normal mood and affect.  Assessment and Plan:  Problem #1 Recurrent rectal bleeding, diarrhea and mucus suggestive of local recurrence of ulcerative colitis. Anoscopic exam however is consistent with hemorrhoidal inflammation. Her colitis usually involves the left colon. We will start her on cort enemas nightly for a week followed by every other day for a week, three times a week for a week, twice a week for a week then switch to hydrocortisone suppositories each bedtime. We will check her CBC and metabolic panel today. She will continue on the Remicade and Imuran. She will also increase her prednisone to 3 mg daily for the next 4 weeks until I see her in the office again. We will refill prednisone 3 mg daily.   07/11/2011 Lauren [name]

## 2011-07-11 NOTE — Telephone Encounter (Signed)
Patient calls today stating that she has been having rectal bleeding x 1.5 weeks. She is currently on Prednisone 1 mg every other day. Her next remicade infusion is scheduled for 2 weeks from now. She states that she is having mucous in the stool as well as abdominal pain with bowel movements. Feels like "a flare is just starting back up." She has an upcoming appointment on Friday with Dr Juanda Chance but wanted to see if she needs to be doing anything different in the meantime.

## 2011-07-11 NOTE — Telephone Encounter (Signed)
Patient will come for appointment today.

## 2011-07-12 LAB — BASIC METABOLIC PANEL
BUN: 13 mg/dL (ref 6–23)
CO2: 29 mEq/L (ref 19–32)
Calcium: 9.4 mg/dL (ref 8.4–10.5)
Chloride: 103 mEq/L (ref 96–112)
Creatinine, Ser: 0.7 mg/dL (ref 0.4–1.2)
GFR: 95.57 mL/min (ref >60.00)
Glucose, Bld: 88 mg/dL (ref 70–99)
Potassium: 3.9 mEq/L (ref 3.5–5.1)
Sodium: 139 mEq/L (ref 135–145)

## 2011-07-13 ENCOUNTER — Telehealth: Payer: Self-pay | Admitting: *Deleted

## 2011-07-13 NOTE — Telephone Encounter (Signed)
Message copied by Daphine Deutscher on Thu Jul 13, 2011  8:42 AM ------      Message from: Hart Carwin      Created: Wed Jul 12, 2011  7:16 PM       Please call pt with normal results of CBC, also K+ is normal at 3.9

## 2011-07-13 NOTE — Telephone Encounter (Signed)
Results given to patient as per Dr. Brodie. 

## 2011-07-14 ENCOUNTER — Ambulatory Visit: Payer: BC Managed Care – PPO | Admitting: Internal Medicine

## 2011-07-18 ENCOUNTER — Other Ambulatory Visit: Payer: Self-pay | Admitting: Internal Medicine

## 2011-07-26 ENCOUNTER — Other Ambulatory Visit (HOSPITAL_COMMUNITY): Payer: Self-pay | Admitting: *Deleted

## 2011-07-26 MED ORDER — ACETAMINOPHEN 325 MG PO TABS: 650.0000 mg | ORAL_TABLET | ORAL | Status: DC

## 2011-07-26 MED ORDER — DIPHENHYDRAMINE HCL 25 MG PO TABS: 25.0000 mg | ORAL_TABLET | ORAL | Status: DC

## 2011-07-28 ENCOUNTER — Encounter (HOSPITAL_COMMUNITY)
Admission: RE | Admit: 2011-07-28 | Discharge: 2011-07-28 | Disposition: A | Discharge status: Home / Self Care | Payer: BC Managed Care – PPO | Source: Ambulatory Visit | Attending: Internal Medicine | Admitting: Internal Medicine

## 2011-07-28 ENCOUNTER — Telehealth: Payer: Self-pay | Admitting: Internal Medicine

## 2011-07-28 DIAGNOSIS — K5190 Ulcerative colitis, unspecified, without complications: Secondary | ICD-10-CM | POA: Insufficient documentation

## 2011-07-28 MED ORDER — DIPHENHYDRAMINE HCL 25 MG PO TABS
25.0000 mg | ORAL_TABLET | ORAL | Status: AC
  Administered 2011-07-28: 25 mg via ORAL
  Filled 2011-07-28: qty 1

## 2011-07-28 MED ORDER — SODIUM CHLORIDE 0.9 % IV SOLN
5.0000 mg/kg | INTRAVENOUS | Status: AC
  Administered 2011-07-28: 400 mg via INTRAVENOUS
  Filled 2011-07-28: qty 40

## 2011-07-28 MED ORDER — SODIUM CHLORIDE 0.9 % IV SOLN
INTRAVENOUS | Status: DC
  Administered 2011-07-28: 11:00:00 via INTRAVENOUS

## 2011-07-28 MED ORDER — ACETAMINOPHEN 325 MG PO TABS
650.0000 mg | ORAL_TABLET | ORAL | Status: AC
  Administered 2011-07-28: 650 mg via ORAL

## 2011-07-28 NOTE — Progress Notes (Signed)
Spoke c dr Juanda Chance.Pt hs just completed cipro 500 bid, (on 11.23)  OK per md to proceed c remicade..she will make notation in chart to proceed

## 2011-07-28 NOTE — Telephone Encounter (Signed)
Short Stay called to state that patient has been on Cipro for a UTI and wants to know if it is okay to proceed with Remicade infusion. Patient apparently d/c'ed Cipro on 07/21/11. Per Dr Leone Payor verbal order, patient may go forward with infusion. Short Stay staff notified.

## 2011-08-01 ENCOUNTER — Other Ambulatory Visit: Payer: Self-pay | Admitting: Internal Medicine

## 2011-08-01 NOTE — Progress Notes (Signed)
-----   Message -----    From: Hart Carwin, MD    Sent: 07/29/2011   2:44 PM      To: Vernia Buff, CMA Subject: FW: remicade orders                            [name], can You please , renew the Remicade orders?? Fortunato Curling DB ----- Message -----    From: Sim Boast, RN    Sent: 07/28/2011   2:03 PM      To: Hart Carwin, MD Subject: remicade orders                                Please reorder this patients orders her next appointment is 09/22/11

## 2011-08-13 ENCOUNTER — Other Ambulatory Visit: Payer: Self-pay | Admitting: Internal Medicine

## 2011-08-21 ENCOUNTER — Encounter: Payer: Self-pay | Admitting: Internal Medicine

## 2011-08-21 ENCOUNTER — Ambulatory Visit (INDEPENDENT_AMBULATORY_CARE_PROVIDER_SITE_OTHER): Payer: BC Managed Care – PPO | Admitting: Internal Medicine

## 2011-08-21 VITALS — BP 142/86 | HR 68 | Ht 59.0 in | Wt 188.0 lb

## 2011-08-21 DIAGNOSIS — K5100 Ulcerative (chronic) pancolitis without complications: Secondary | ICD-10-CM

## 2011-08-21 MED ORDER — HYDROCORTISONE ACETATE 25 MG RE SUPP: RECTAL | Status: DC

## 2011-08-21 NOTE — Progress Notes (Signed)
Lauren [name] 1961/07/07 MRN [id number]    History of Present Illness:  This is a 50 year old, white female with ulcerative colitis of greater than 10 years duration. Her last appointment was on 07/11/2011. She had breakthrough bleeding on a prednisone taper 1 mg every other day. We increased her prednisone to 3 mg daily which resulted in marked improvement of her symptoms. She currently denies any rectal bleeding, diarrhea or abdominal pain. She has occasional soft stools and urgency. She has continued on Imuran 100 mg daily, mesalamine at 4.8 g daily, and Remicade infusions 5 mg/kg  every 8 weeks. Her last infusion was on 07/28/2011. Her last colonoscopy in August 2012 showed active colitis from 0-30 cm. She was on Cort enemas which were gradually discontinued about 2 weeks ago. She continues Anucort suppositories 25 mg for hemorrhoids.    Past Medical History  Diagnosis Date  . Ulcerative colitis   . SVT (supraventricular tachycardia)   . Personal history of meningioma of the brain   . Von Willebrands disease   . Anxiety and depression   . Vitamin B12 deficiency   . GERD (gastroesophageal reflux disease)   . External hemorrhoids   . Tennis elbow    Past Surgical History  Procedure Date  . Brain meningioma excision 05/2007  . Implantation bone anchored hearing aid   . Cardiac electrophysiology study and ablation 10/22/09    reports that she has never smoked. She has never used smokeless tobacco. She reports that she does not drink alcohol or use illicit drugs. family history includes Diabetes in her maternal uncle; Heart disease in her maternal grandfather and mother; and Stroke in her mother.  There is no history of Colon cancer. Allergies  Allergen Reactions  . Atenolol   . Cephalexin         Review of Systems: Negative for gastroesophageal reflux. Negative for shortness of breath or chest pain  The remainder of the 10 point ROS is negative except as outlined in  H&P    Assessment and Plan:  Problem #1 Left sided ulcerative colitis under satisfactory control on multiple medications including Imuran, Remicade, low-dose prednisone and mesalamine. She will stay on the same dose of prednisone until her last appointment in 3 months. She will need labs prior to her next visit.   08/21/2011 Lauren [name]

## 2011-08-21 NOTE — Patient Instructions (Signed)
We have sent the following medications to your pharmacy for you to pick up at your convenience: Anucort Please follow up with Dr Juanda Chance in 3 months. CC: Dr Earl Lites

## 2011-09-11 ENCOUNTER — Other Ambulatory Visit: Payer: Self-pay | Admitting: Internal Medicine

## 2011-09-11 ENCOUNTER — Telehealth: Payer: Self-pay | Admitting: *Deleted

## 2011-09-11 NOTE — Telephone Encounter (Signed)
I agree with increasing Prednisone to 5mg /day. And alternating Cort enema with suppositories ( Anucort) x 2 weeks, then call back

## 2011-09-11 NOTE — Telephone Encounter (Signed)
Left a message for patient to call me. 

## 2011-09-11 NOTE — Telephone Encounter (Signed)
Patient calling to report she has been using the Anucort suppository nightly x 1 week. Her internal and external hemorrhoids are bothering her and she is not passing the stool well. She is also having nausea and vomiting. Usually she vomits once in the AM. She has been on Prednisone 3 mg but increased it to 5 mg on 09/11/11. She is wondering about using the suppositories and alternating with Cort enema. She is due for Remicade on next Friday. Scheduled patient on 09/15/11 at 8:45 AM with Dr. Juanda Chance tentatively. Please, advise.

## 2011-09-12 NOTE — Telephone Encounter (Signed)
Spoke with patient and gave her Dr. Regino Schultze recommendations. She will call back in two weeks with update.

## 2011-09-13 ENCOUNTER — Other Ambulatory Visit: Payer: Self-pay | Admitting: Internal Medicine

## 2011-09-13 ENCOUNTER — Telehealth: Payer: Self-pay

## 2011-09-13 MED ORDER — HYDROCORTISONE ACE-PRAMOXINE 2.5-1 % RE CREA: TOPICAL_CREAM | Freq: Three times a day (TID) | RECTAL | Status: AC

## 2011-09-13 MED ORDER — ONDANSETRON HCL 4 MG PO TABS: 4.0000 mg | ORAL_TABLET | Freq: Three times a day (TID) | ORAL | Status: DC | PRN

## 2011-09-13 NOTE — Telephone Encounter (Signed)
Patient complains of continued vomiting (she does not have nausea). Also complains of difficulty passing stool as well as inflammation of hemorrhiods. She states that they are not painful, just swollen. I have spoken to Dr Juanda Chance and she has okayed patient to have Analpram in addition to her suppositories. She has also okayed Zofran #15 for patient to take as well. Patient has been added to the schedule for Friday, 09/15/11. Dr Juanda Chance questions possible Gastroparesis causing vomiting?... Patient did have food noted in stomach during previous EGD.

## 2011-09-15 ENCOUNTER — Telehealth: Payer: Self-pay | Admitting: *Deleted

## 2011-09-15 ENCOUNTER — Other Ambulatory Visit (INDEPENDENT_AMBULATORY_CARE_PROVIDER_SITE_OTHER): Payer: BC Managed Care – PPO

## 2011-09-15 ENCOUNTER — Other Ambulatory Visit (HOSPITAL_COMMUNITY): Payer: Self-pay | Admitting: *Deleted

## 2011-09-15 ENCOUNTER — Ambulatory Visit: Payer: BC Managed Care – PPO | Admitting: Internal Medicine

## 2011-09-15 ENCOUNTER — Ambulatory Visit (INDEPENDENT_AMBULATORY_CARE_PROVIDER_SITE_OTHER): Payer: BC Managed Care – PPO | Admitting: Internal Medicine

## 2011-09-15 ENCOUNTER — Encounter: Payer: Self-pay | Admitting: Internal Medicine

## 2011-09-15 DIAGNOSIS — R112 Nausea with vomiting, unspecified: Secondary | ICD-10-CM

## 2011-09-15 DIAGNOSIS — R34 Anuria and oliguria: Secondary | ICD-10-CM

## 2011-09-15 DIAGNOSIS — E876 Hypokalemia: Secondary | ICD-10-CM

## 2011-09-15 DIAGNOSIS — K5190 Ulcerative colitis, unspecified, without complications: Secondary | ICD-10-CM

## 2011-09-15 LAB — CBC WITH DIFFERENTIAL/PLATELET
Basophils Absolute: 0 10*3/uL (ref 0.0–0.1)
Basophils Relative: 0.5 % (ref 0.0–3.0)
Eosinophils Absolute: 0 10*3/uL (ref 0.0–0.7)
Eosinophils Relative: 0.4 % (ref 0.0–5.0)
HCT: 42.4 % (ref 36.0–46.0)
Hemoglobin: 14.5 g/dL (ref 12.0–15.0)
Lymphocytes Relative: 13 % (ref 12.0–46.0)
Lymphs Abs: 1 10*3/uL (ref 0.7–4.0)
MCHC: 34.1 g/dL (ref 30.0–36.0)
MCV: 94.1 fl (ref 78.0–100.0)
Monocytes Absolute: 0.6 10*3/uL (ref 0.1–1.0)
Monocytes Relative: 7.8 % (ref 3.0–12.0)
Neutro Abs: 6 10*3/uL (ref 1.4–7.7)
Neutrophils Relative %: 78.3 % — ABNORMAL HIGH (ref 43.0–77.0)
Platelets: 254 10*3/uL (ref 150.0–400.0)
RBC: 4.5 Mil/uL (ref 3.87–5.11)
RDW: 14.6 % (ref 11.5–14.6)
WBC: 7.6 10*3/uL (ref 4.5–10.5)

## 2011-09-15 LAB — URINALYSIS, ROUTINE W REFLEX MICROSCOPIC
Bilirubin Urine: NEGATIVE
Ketones, ur: NEGATIVE
Leukocytes, UA: NEGATIVE
Nitrite: NEGATIVE
Specific Gravity, Urine: 1.01 (ref 1.000–1.030)
Total Protein, Urine: NEGATIVE
Urine Glucose: NEGATIVE
Urobilinogen, UA: 0.2 (ref 0.0–1.0)
pH: 7 (ref 5.0–8.0)

## 2011-09-15 LAB — BASIC METABOLIC PANEL
BUN: 11 mg/dL (ref 6–23)
CO2: 33 mEq/L — ABNORMAL HIGH (ref 19–32)
Calcium: 9.1 mg/dL (ref 8.4–10.5)
Chloride: 97 mEq/L (ref 96–112)
Creatinine, Ser: 0.8 mg/dL (ref 0.4–1.2)
GFR: 77.17 mL/min (ref >60.00)
Glucose, Bld: 77 mg/dL (ref 70–99)
Potassium: 3.2 mEq/L — ABNORMAL LOW (ref 3.5–5.1)
Sodium: 137 mEq/L (ref 135–145)

## 2011-09-15 LAB — LIPASE
Lipase: 47 U/L (ref 11.0–59.0)
Lipase: 46 U/L (ref 11.0–59.0)

## 2011-09-15 LAB — AMYLASE
Amylase: 64 U/L (ref 27–131)
Amylase: 64 U/L (ref 27–131)

## 2011-09-15 NOTE — Progress Notes (Signed)
Lauren [name] [birth year] MRN [id number]        History of Present Illness:  This is a 51 year old white female with ulcerative colitis of more than 10 years duration and breakthrough bleeding. We have been  unable to taper her prednisone. She is currently on 5 mg a day and having urgent stools with small amounts of blood. She is on maximum medical therapy including Remicade 5 mg/kg every 8 weeks. Her next infusion is scheduled for next week. Imuran 100 mg a day, mesalamine 4.8 g a day, cort enemas every other day and Anucort suppositories every other day. She is also on Bentyl 10 mg when necessary for crampy abdominal pain. She has a history of retained gastric contents on an upper endoscopy several years ago. She is having symptoms of regurgitation and nausea with some vomiting. Her upper abdominal ultrasound in August 2012 showed angiolipoma of the kidney versus kidney stone as well as a small gallbladder polyp and normal common bile duct. Her last colonoscopy in May 2012 showed active colitis from 0-30 cm and no adenomatous polyps. She has Zofran available for nausea but has not taken it yet. She missed work last week because of nausea and not feeling well.   Past Medical History  Diagnosis Date  . Ulcerative colitis   . SVT (supraventricular tachycardia)   . Personal history of meningioma of the brain   . Von Willebrands disease   . Anxiety and depression   . Vitamin B12 deficiency   . GERD (gastroesophageal reflux disease)   . External hemorrhoids   . Tennis elbow    Past Surgical History  Procedure Date  . Brain meningioma excision 05/2007  . Implantation bone anchored hearing aid   . Cardiac electrophysiology study and ablation 10/22/09    reports that she has never smoked. She has never used smokeless tobacco. She reports that she does not drink alcohol or use illicit drugs. family history includes Diabetes in her maternal uncle; Heart disease in her maternal grandfather and  mother; and Stroke in her mother.  There is no history of Colon cancer. Allergies  Allergen Reactions  . Atenolol   . Cephalexin         Review of Systems: Denies dysphagia odynophagia or chest pain or reflux symptoms  The remainder of the 10 point ROS is negative except as outlined in H&P   Physical Exam: General appearance  Well developed, in no distress. Eyes- non icteric. HEENT nontraumatic, normocephalic. Mouth no lesions, tongue papillated, no cheilosis. Neck supple without adenopathy, thyroid not enlarged, no carotid bruits, no JVD. Lungs Clear to auscultation bilaterally. Cor normal S1, normal S2, regular rhythm, no murmur,  quiet precordium. Abdomen: Soft nontender with normoactive bowel sounds. Mild discomfort in the epigastrium. Liver edge at costal margin. Rectal: Rectal and anoscopic exam show a total large external as well as internal hemorrhoids. No stigmata of bleeding. Mildly erythematous ampulla but no friability or bleeding. Stool is Hemoccult positive. Extremities no pedal edema. Skin no lesions. Neurological alert and oriented x 3. Psychological normal mood and affect.   Assessment and Plan:  Problem #1 Left sided ulcerative colitis refractory to medical therapy. She is doing reasonably well but it having urgency and occasional bleeding. She needs to continue on all of her medications as they are.  Problem #2 Nausea and vomiting with history of suspected slow gastric emptying. We will proceed with a gastric emptying scan to assess her gastric emptying. She was on Prilosec in the past  but has not found it useful. We may consider giving her samples of Prilosec to take when necessary. She also has Zofran. Her gallbladder appears normal except for a polyp. She is on Prozac 20 mg a day which possibly decreases gastric emptying but she needs to take her Prozac. We are checking her amylase and lipase to rule out pancreatitis. She will be due for an office visit in 3  months.   09/15/2011 Lauren [name]

## 2011-09-15 NOTE — Telephone Encounter (Signed)
I have spoken to the pt too and we agreed for her to have her K+ rechecked one day after her remicade. Please put in  the Lab request.Thanx

## 2011-09-15 NOTE — Telephone Encounter (Signed)
Per Dr. Juanda Chance, potassium level is low. Patient needs to take K Dur 20 meq po daily x 2 weeks then recheck potassium level. Called patient and gave her Dr. Regino Schultze recommendation.Patient states she is getting her Remicade next Friday and this usually elevates her potassium level. She wants to be sure Dr. Juanda Chance wants her to take the K Dur for 2 weeks. Please, advise.

## 2011-09-15 NOTE — Patient Instructions (Signed)
Your physician has requested that you go to the basement for the following lab work before leaving today: Amylase, Lipase, Urinalysis You have been scheduled for a gastric emptying scan at [facility] on 10/06/11 at 8:00 am. Please arrive at least 15 minutes prior to your appointment for registration. Please make certain not to have anything to eat or drink at least 8 hours prior to your test. Hold all stomach medications (ex: Zofran, phenergan, Reglan) 48 hours prior to your test. If you need to reschedule your appointment, please contact radiology scheduling at [phone]. CC: Dr Cleta Alberts

## 2011-09-18 ENCOUNTER — Other Ambulatory Visit: Payer: Self-pay | Admitting: Internal Medicine

## 2011-09-18 MED ORDER — POTASSIUM CHLORIDE CRYS ER 20 MEQ PO TBCR: 20.0000 meq | EXTENDED_RELEASE_TABLET | Freq: Every day | ORAL | Status: DC

## 2011-09-18 NOTE — Telephone Encounter (Signed)
Dr. Juanda Chance, Ms. Lukic is getting her Remicade on Friday. Do you want her to go to the hospital on Saturday for K+ level or get it in our lab on Monday?

## 2011-09-18 NOTE — Telephone Encounter (Signed)
In our office, on Monday. Sorry.

## 2011-09-19 ENCOUNTER — Telehealth: Payer: Self-pay | Admitting: *Deleted

## 2011-09-19 NOTE — Telephone Encounter (Signed)
error 

## 2011-09-19 NOTE — Telephone Encounter (Signed)
Labs in EPIC. Patient aware. 

## 2011-09-22 ENCOUNTER — Encounter (HOSPITAL_COMMUNITY)
Admission: RE | Admit: 2011-09-22 | Discharge: 2011-09-22 | Disposition: A | Discharge status: Home / Self Care | Payer: BC Managed Care – PPO | Source: Ambulatory Visit | Attending: Internal Medicine | Admitting: Internal Medicine

## 2011-09-22 DIAGNOSIS — K5190 Ulcerative colitis, unspecified, without complications: Secondary | ICD-10-CM | POA: Insufficient documentation

## 2011-09-22 MED ORDER — DIPHENHYDRAMINE HCL 25 MG PO CAPS
50.0000 mg | ORAL_CAPSULE | ORAL | Status: DC
  Administered 2011-09-22: 25 mg via ORAL

## 2011-09-22 MED ORDER — SODIUM CHLORIDE 0.9 % IV SOLN
5.0000 mg/kg | INTRAVENOUS | Status: DC
  Administered 2011-09-22: 400 mg via INTRAVENOUS
  Filled 2011-09-22: qty 40

## 2011-09-22 MED ORDER — ACETAMINOPHEN 325 MG PO TABS
650.0000 mg | ORAL_TABLET | ORAL | Status: DC
  Administered 2011-09-22: 650 mg via ORAL

## 2011-09-22 MED ORDER — SODIUM CHLORIDE 0.9 % IV SOLN
INTRAVENOUS | Status: AC
  Administered 2011-09-22: 08:00:00 via INTRAVENOUS

## 2011-09-25 ENCOUNTER — Other Ambulatory Visit (INDEPENDENT_AMBULATORY_CARE_PROVIDER_SITE_OTHER): Payer: BC Managed Care – PPO

## 2011-09-25 DIAGNOSIS — E876 Hypokalemia: Secondary | ICD-10-CM

## 2011-09-25 LAB — POTASSIUM: Potassium: 3.8 mEq/L (ref 3.5–5.1)

## 2011-09-27 ENCOUNTER — Telehealth: Payer: Self-pay | Admitting: *Deleted

## 2011-09-27 DIAGNOSIS — E876 Hypokalemia: Secondary | ICD-10-CM

## 2011-09-27 NOTE — Telephone Encounter (Signed)
Message copied by Daphine Deutscher on Wed Sep 27, 2011  2:33 PM ------      Message from: Hart Carwin      Created: Tue Sep 26, 2011  9:46 PM       Please call pt with normal K+ of 3.8. Continue K+ supplement. Repeat K+ in 4 weeks or when she has her Remicade infusion

## 2011-09-27 NOTE — Telephone Encounter (Signed)
Spoke with patient and gave her results and recommendations. Patient will get recheck in 4 weeks(Remicade is in 8 weeks).

## 2011-09-29 ENCOUNTER — Telehealth: Payer: Self-pay | Admitting: Internal Medicine

## 2011-09-29 MED ORDER — POTASSIUM CHLORIDE CRYS ER 20 MEQ PO TBCR: 20.0000 meq | EXTENDED_RELEASE_TABLET | Freq: Every day | ORAL | Status: DC

## 2011-09-29 NOTE — Telephone Encounter (Signed)
rx sent

## 2011-10-04 ENCOUNTER — Telehealth: Payer: Self-pay | Admitting: *Deleted

## 2011-10-04 NOTE — Telephone Encounter (Signed)
Message copied by Daphine Deutscher on Wed Oct 04, 2011 10:53 AM ------      Message from: Daphine Deutscher      Created: Tue Sep 12, 2011  9:49 AM       Did patient call with 2 week update(DB)

## 2011-10-04 NOTE — Telephone Encounter (Signed)
Spoke with patient and she is much better. No vomiting. Energy level is back up too.

## 2011-10-06 ENCOUNTER — Encounter (HOSPITAL_COMMUNITY)
Admission: RE | Admit: 2011-10-06 | Discharge: 2011-10-06 | Disposition: A | Discharge status: Home / Self Care | Payer: BC Managed Care – PPO | Source: Ambulatory Visit | Attending: Internal Medicine | Admitting: Internal Medicine

## 2011-10-06 ENCOUNTER — Ambulatory Visit (INDEPENDENT_AMBULATORY_CARE_PROVIDER_SITE_OTHER): Payer: BC Managed Care – PPO | Admitting: Internal Medicine

## 2011-10-06 ENCOUNTER — Encounter (HOSPITAL_COMMUNITY): Payer: Self-pay

## 2011-10-06 DIAGNOSIS — K5190 Ulcerative colitis, unspecified, without complications: Secondary | ICD-10-CM

## 2011-10-06 DIAGNOSIS — R112 Nausea with vomiting, unspecified: Secondary | ICD-10-CM | POA: Insufficient documentation

## 2011-10-06 DIAGNOSIS — R34 Anuria and oliguria: Secondary | ICD-10-CM

## 2011-10-06 MED ORDER — TECHNETIUM TC 99M SULFUR COLLOID
2.2000 | Freq: Once | INTRAVENOUS | Status: AC | PRN
  Administered 2011-10-06: 2.2 via INTRAVENOUS

## 2011-10-09 ENCOUNTER — Telehealth: Payer: Self-pay | Admitting: *Deleted

## 2011-10-09 LAB — TB SKIN TEST
Induration: 0
TB Skin Test: NEGATIVE mm

## 2011-10-09 NOTE — Telephone Encounter (Signed)
Spoke with patient and gave her results as per Dr. Brodie. 

## 2011-10-09 NOTE — Progress Notes (Signed)
Pt came for a TB skin test as part of her Remicade therapy

## 2011-10-09 NOTE — Telephone Encounter (Signed)
Message copied by Daphine Deutscher on Mon Oct 09, 2011  9:19 AM ------      Message from: Hart Carwin      Created: Sat Oct 07, 2011 10:57 PM       Please call pt with normal GES- 20% remaining in the stomach after 2 hours, normal is anything less than 30%.

## 2011-10-18 ENCOUNTER — Other Ambulatory Visit: Payer: Self-pay | Admitting: Internal Medicine

## 2011-10-19 ENCOUNTER — Other Ambulatory Visit: Payer: Self-pay | Admitting: Internal Medicine

## 2011-10-20 ENCOUNTER — Other Ambulatory Visit: Payer: Self-pay

## 2011-10-20 MED ORDER — METOPROLOL TARTRATE 50 MG PO TABS: 50.0000 mg | ORAL_TABLET | Freq: Every day | ORAL | Status: DC

## 2011-10-23 ENCOUNTER — Other Ambulatory Visit (INDEPENDENT_AMBULATORY_CARE_PROVIDER_SITE_OTHER): Payer: BC Managed Care – PPO

## 2011-10-23 ENCOUNTER — Telehealth: Payer: Self-pay | Admitting: *Deleted

## 2011-10-23 DIAGNOSIS — E876 Hypokalemia: Secondary | ICD-10-CM

## 2011-10-23 LAB — POTASSIUM: Potassium: 4 mEq/L (ref 3.5–5.1)

## 2011-10-23 NOTE — Telephone Encounter (Signed)
Message copied by Hulan Saas on Mon Oct 23, 2011 10:29 AM ------      Message from: Hulan Saas      Created: Wed Sep 27, 2011  2:38 PM       Call patient and remind due for K level on 10/25/11.(DB)

## 2011-10-23 NOTE — Telephone Encounter (Signed)
Left a message reminding patient of lab appointment.

## 2011-10-24 ENCOUNTER — Other Ambulatory Visit: Payer: Self-pay

## 2011-10-24 ENCOUNTER — Other Ambulatory Visit: Payer: Self-pay | Admitting: *Deleted

## 2011-10-24 DIAGNOSIS — E876 Hypokalemia: Secondary | ICD-10-CM

## 2011-10-24 LAB — VITAMIN D 25 HYDROXY (VIT D DEFICIENCY, FRACTURES): Vit D, 25-Hydroxy: 33 ng/mL (ref 30–89)

## 2011-10-24 MED ORDER — POTASSIUM CHLORIDE CRYS ER 20 MEQ PO TBCR: 20.0000 meq | EXTENDED_RELEASE_TABLET | Freq: Every day | ORAL | Status: DC

## 2011-10-26 ENCOUNTER — Other Ambulatory Visit: Payer: Self-pay | Admitting: Internal Medicine

## 2011-10-30 ENCOUNTER — Telehealth: Payer: Self-pay

## 2011-10-30 NOTE — Telephone Encounter (Signed)
.  [facility] HAVE QUESTIONS REGARDING THE MEDICINE WRITTEN BY RYAN. [place]

## 2011-10-31 NOTE — Telephone Encounter (Signed)
Pharmacist reports that [facility] had sent Rx for metoprolol and pt stated she is on the XL, not reg. Pt still had two RFs from Dr Cleta Alberts for XL, so pharmacy RF'd with that, but wanted [place] to know what happened and that they did not fill the regular.

## 2011-11-15 ENCOUNTER — Other Ambulatory Visit: Payer: Self-pay | Admitting: *Deleted

## 2011-11-17 ENCOUNTER — Telehealth: Payer: Self-pay | Admitting: *Deleted

## 2011-11-17 ENCOUNTER — Encounter (HOSPITAL_COMMUNITY)
Admission: RE | Admit: 2011-11-17 | Discharge: 2011-11-17 | Disposition: A | Discharge status: Home / Self Care | Payer: BC Managed Care – PPO | Source: Ambulatory Visit | Attending: Internal Medicine | Admitting: Internal Medicine

## 2011-11-17 DIAGNOSIS — K5190 Ulcerative colitis, unspecified, without complications: Secondary | ICD-10-CM | POA: Insufficient documentation

## 2011-11-17 LAB — POTASSIUM: Potassium: 3.6 mEq/L (ref 3.5–5.1)

## 2011-11-17 MED ORDER — DIPHENHYDRAMINE HCL 25 MG PO TABS
25.0000 mg | ORAL_TABLET | ORAL | Status: DC
  Administered 2011-11-17: 25 mg via ORAL
  Filled 2011-11-17: qty 1

## 2011-11-17 MED ORDER — ACETAMINOPHEN 325 MG PO TABS
650.0000 mg | ORAL_TABLET | ORAL | Status: DC
  Administered 2011-11-17: 650 mg via ORAL

## 2011-11-17 MED ORDER — SODIUM CHLORIDE 0.9 % IV SOLN
5.0000 mg/kg | INTRAVENOUS | Status: DC
  Administered 2011-11-17: 400 mg via INTRAVENOUS
  Filled 2011-11-17: qty 40

## 2011-11-17 MED ORDER — SODIUM CHLORIDE 0.9 % IV SOLN
INTRAVENOUS | Status: DC
  Administered 2011-11-17: 11:00:00 via INTRAVENOUS

## 2011-11-17 NOTE — Telephone Encounter (Signed)
OV 3 months-  about 5///3///2013

## 2011-11-17 NOTE — Telephone Encounter (Signed)
Spoke with patient and gave her lab results. She wants to know when she should have f/u OV. Last OV 10/01/11. Please, advise.

## 2011-11-17 NOTE — Discharge Instructions (Signed)
Refer to printed sheet for next appointment. Short Stay Phone # [phone]   YOUR NEXT APPOINTMENT FOR REMICADE IT 01/12/12 Friday @ 10:30 AM

## 2011-11-20 NOTE — Telephone Encounter (Signed)
Spoke with patient and scheduled OV on 01/05/12 at 2:15 pm

## 2011-12-20 ENCOUNTER — Other Ambulatory Visit: Payer: Self-pay | Admitting: Emergency Medicine

## 2012-01-05 ENCOUNTER — Ambulatory Visit: Payer: BC Managed Care – PPO | Admitting: Internal Medicine

## 2012-01-09 ENCOUNTER — Encounter: Payer: Self-pay | Admitting: Internal Medicine

## 2012-01-09 ENCOUNTER — Ambulatory Visit (INDEPENDENT_AMBULATORY_CARE_PROVIDER_SITE_OTHER): Payer: BC Managed Care – PPO | Admitting: Internal Medicine

## 2012-01-09 ENCOUNTER — Other Ambulatory Visit (INDEPENDENT_AMBULATORY_CARE_PROVIDER_SITE_OTHER): Payer: BC Managed Care – PPO

## 2012-01-09 DIAGNOSIS — K5190 Ulcerative colitis, unspecified, without complications: Secondary | ICD-10-CM

## 2012-01-09 DIAGNOSIS — Z79899 Other long term (current) drug therapy: Secondary | ICD-10-CM

## 2012-01-09 DIAGNOSIS — K5100 Ulcerative (chronic) pancolitis without complications: Secondary | ICD-10-CM

## 2012-01-09 LAB — CBC WITH DIFFERENTIAL/PLATELET
Basophils Absolute: 0 10*3/uL (ref 0.0–0.1)
Basophils Relative: 0.5 % (ref 0.0–3.0)
Eosinophils Absolute: 0.1 10*3/uL (ref 0.0–0.7)
Eosinophils Relative: 1.1 % (ref 0.0–5.0)
HCT: 41.3 % (ref 36.0–46.0)
Hemoglobin: 13.9 g/dL (ref 12.0–15.0)
Lymphocytes Relative: 26.7 % (ref 12.0–46.0)
Lymphs Abs: 1.4 10*3/uL (ref 0.7–4.0)
MCHC: 33.7 g/dL (ref 30.0–36.0)
MCV: 95.1 fl (ref 78.0–100.0)
Monocytes Absolute: 0.5 10*3/uL (ref 0.1–1.0)
Monocytes Relative: 9.6 % (ref 3.0–12.0)
Neutro Abs: 3.3 10*3/uL (ref 1.4–7.7)
Neutrophils Relative %: 62.1 % (ref 43.0–77.0)
Platelets: 235 10*3/uL (ref 150.0–400.0)
RBC: 4.35 Mil/uL (ref 3.87–5.11)
RDW: 14.5 % (ref 11.5–14.6)
WBC: 5.4 10*3/uL (ref 4.5–10.5)

## 2012-01-09 MED ORDER — PREDNISONE 1 MG PO TABS: ORAL_TABLET | ORAL | Status: DC

## 2012-01-09 NOTE — Progress Notes (Signed)
Lauren [name] 08/15/1961 MRN [id number]        History of Present Illness:  This is a 51 year old white female with known ulcerative colitis of more than 13 years duration, predominantly in the left colon. She has been on slow prednisone taper with recurrent flare ups. She has been on Remicade 5 mg per kilogram for past 2 years, Imuran 100 mg a day and mesalamine 4.8 g daily. She has recently been able to discontinue her Cort enemas andhydro cortisone suppositories. Last colonoscopy in May 2012 showed active colitis from 0-13 cm. There were no polyps. She is doing well on prednisone 5 mg daily. She denies any rectal bleeding or abdominal pain   Past Medical History  Diagnosis Date  . Ulcerative colitis   . SVT (supraventricular tachycardia)   . Personal history of meningioma of the brain   . Von Willebrands disease   . Anxiety and depression   . Vitamin B12 deficiency   . GERD (gastroesophageal reflux disease)   . External hemorrhoids   . Tennis elbow    Past Surgical History  Procedure Date  . Brain meningioma excision 05/2007  . Implantation bone anchored hearing aid   . Cardiac electrophysiology study and ablation 10/22/09    reports that she has never smoked. She has never used smokeless tobacco. She reports that she does not drink alcohol or use illicit drugs. family history includes Diabetes in her maternal uncle; Heart disease in her maternal grandfather and mother; and Stroke in her mother.  There is no history of Colon cancer. Allergies  Allergen Reactions  . Atenolol   . Cephalexin         Review of Systems: Denies heartburn dysphagia. Recent positive gastric emptying scan with 20% retention in 2 hours  The remainder of the 10 point ROS is negative except as outlined in H&P   Physical Exam: General appearance  Well developed, in no distress. Eyes- non icteric. HEENT nontraumatic, normocephalic. Mouth no lesions, tongue papillated, no cheilosis. Neck supple  without adenopathy, thyroid not enlarged, no carotid bruits, no JVD. Lungs Clear to auscultation bilaterally. Cor normal S1, normal S2, regular rhythm, no murmur,  quiet precordium. Abdomen: Soft nontender abdomen with normal active bowel sounds. No distention Rectal: Soft Hemoccult negative stool Extremities no pedal edema. Skin no lesions. Neurological alert and oriented x 3. Psychological normal mood and affect.  Assessment and Plan:  Left-sided ulcerative colitis in remission. Continue Imuran. Remicade and mesalamine. She will decrease her prednisone to 4 mg daily and then by 1 mg every months until she gets to. 2 mg a day. We will be rechecking her CBC today and electrolytes as well as B12 . OV in3 months Immunosuppressed with Imuran and Remicade. Recall colon May 2015.    01/09/2012 Delfin Edis

## 2012-01-09 NOTE — Patient Instructions (Signed)
We have sent the following medications to your pharmacy for you to pick up at your convenience: Prednisone Your physician has requested that you go to the basement for the following lab work before leaving today: CBC, B12, Renal Profile Cc: Dr Arlyss Queen

## 2012-01-10 LAB — RENAL FUNCTION PANEL
Albumin: 4 g/dL (ref 3.5–5.2)
BUN: 12 mg/dL (ref 6–23)
CO2: 28 mEq/L (ref 19–32)
Calcium: 9.2 mg/dL (ref 8.4–10.5)
Chloride: 104 mEq/L (ref 96–112)
Creatinine, Ser: 0.6 mg/dL (ref 0.4–1.2)
GFR: 118.91 mL/min (ref >60.00)
Glucose, Bld: 84 mg/dL (ref 70–99)
Phosphorus: 3.7 mg/dL (ref 2.3–4.6)
Potassium: 3.7 mEq/L (ref 3.5–5.1)
Sodium: 140 mEq/L (ref 135–145)

## 2012-01-10 LAB — VITAMIN B12: Vitamin B-12: 402 pg/mL (ref 211–911)

## 2012-01-12 ENCOUNTER — Encounter (HOSPITAL_COMMUNITY): Payer: Self-pay

## 2012-01-12 ENCOUNTER — Encounter (HOSPITAL_COMMUNITY)
Admission: RE | Admit: 2012-01-12 | Discharge: 2012-01-12 | Disposition: A | Discharge status: Home / Self Care | Payer: BC Managed Care – PPO | Source: Ambulatory Visit | Attending: Internal Medicine | Admitting: Internal Medicine

## 2012-01-12 DIAGNOSIS — K5190 Ulcerative colitis, unspecified, without complications: Secondary | ICD-10-CM | POA: Insufficient documentation

## 2012-01-12 MED ORDER — ACETAMINOPHEN 325 MG PO TABS
ORAL_TABLET | ORAL | Status: AC
  Filled 2012-01-12: qty 2

## 2012-01-12 MED ORDER — SODIUM CHLORIDE 0.9 % IV SOLN
INTRAVENOUS | Status: DC
  Administered 2012-01-12: 250 mL via INTRAVENOUS

## 2012-01-12 MED ORDER — SODIUM CHLORIDE 0.9 % IV SOLN
5.0000 mg/kg | INTRAVENOUS | Status: AC
  Administered 2012-01-12 (×2): 400 mg via INTRAVENOUS
  Filled 2012-01-12: qty 40

## 2012-01-12 MED ORDER — DIPHENHYDRAMINE HCL 25 MG PO TABS
25.0000 mg | ORAL_TABLET | ORAL | Status: AC
  Administered 2012-01-12: 25 mg via ORAL
  Filled 2012-01-12: qty 1

## 2012-01-12 MED ORDER — DIPHENHYDRAMINE HCL 25 MG PO CAPS
ORAL_CAPSULE | ORAL | Status: AC
  Filled 2012-01-12: qty 1

## 2012-01-12 MED ORDER — ACETAMINOPHEN 325 MG PO TABS
650.0000 mg | ORAL_TABLET | ORAL | Status: AC
  Administered 2012-01-12: 650 mg via ORAL

## 2012-01-12 NOTE — Discharge Instructions (Signed)
Infliximab injection What is this medicine? INFLIXIMAB (in [place] i mab) is used to treat Crohn's disease and ulcerative colitis. It is also used to treat ankylosing spondylitis, psoriasis, and some forms of arthritis. This medicine may be used for other purposes; ask your health care provider or pharmacist if you have questions. What should I tell my health care provider before I take this medicine? They need to know if you have any of these conditions: -diabetes -exposure to tuberculosis -heart failure -hepatitis or liver disease -immune system problems -infection -lung or breathing disease, like COPD -multiple sclerosis -current or past resident of [place] or [place] -seizure disorder -an unusual or allergic reaction to infliximab, mouse proteins, other medicines, foods, dyes, or preservatives -pregnant or trying to get pregnant -breast-feeding How should I use this medicine? This medicine is for injection into a vein. It is usually given by a health care professional in a hospital or clinic setting. A special MedGuide will be given to you by the pharmacist with each prescription and refill. Be sure to read this information carefully each time. Talk to your pediatrician regarding the use of this medicine in children. Special care may be needed. Overdosage: If you think you have taken too much of this medicine contact a poison control center or emergency room at once. NOTE: This medicine is only for you. Do not share this medicine with others. What if I miss a dose? It is important not to miss your dose. Call your doctor or health care professional if you are unable to keep an appointment. What may interact with this medicine? Do not take this medicine with any of the following medications: -anakinra -rilonacept This medicine may also interact with the following medications: -vaccines This list may not describe all possible interactions. Give your health care provider  a list of all the medicines, herbs, non-prescription drugs, or dietary supplements you use. Also tell them if you smoke, drink alcohol, or use illegal drugs. Some items may interact with your medicine. What should I watch for while using this medicine? Visit your doctor or health care professional for regular checks on your progress. If you get a cold or other infection while receiving this medicine, call your doctor or health care professional. Do not treat yourself. This medicine may decrease your body's ability to fight infections. Before beginning therapy, your doctor may do a test to see if you have been exposed to tuberculosis. This medicine may make the symptoms of heart failure worse in some patients. If you notice symptoms such as increased shortness of breath or swelling of the ankles or legs, contact your health care provider right away. If you are going to have surgery or dental work, tell your health care professional or dentist that you have received this medicine. If you take this medicine for plaque psoriasis, stay out of the sun. If you cannot avoid being in the sun, wear protective clothing and use sunscreen. Do not use sun lamps or tanning beds/booths. What side effects may I notice from receiving this medicine? Side effects that you should report to your doctor or health care professional as soon as possible: -allergic reactions like skin rash, itching or hives, swelling of the face, lips, or tongue -chest pain -fever or chills, usually related to the infusion -muscle or joint pain -red, scaly patches or raised bumps on the skin -signs of infection - fever or chills, cough, sore throat, pain or difficulty passing urine -swollen lymph nodes in the neck, underarm,  or groin areas -unexplained weight loss -unusual bleeding or bruising -unusually weak or tired -yellowing of the eyes or skin Side effects that usually do not require medical attention (report to your doctor or health  care professional if they continue or are bothersome): -headache -heartburn or stomach pain -nausea, vomiting This list may not describe all possible side effects. Call your doctor for medical advice about side effects. You may report side effects to FDA at 1-800-FDA-1088. Where should I keep my medicine? This drug is given in a hospital or clinic and will not be stored at home. NOTE: This sheet is a summary. It may not cover all possible information. If you have questions about this medicine, talk to your doctor, pharmacist, or health care provider.  2012, [employer]. (04/01/2008 10:26:02 AM)

## 2012-01-14 ENCOUNTER — Other Ambulatory Visit: Payer: Self-pay | Admitting: Internal Medicine

## 2012-02-21 ENCOUNTER — Other Ambulatory Visit: Payer: Self-pay | Admitting: Internal Medicine

## 2012-03-08 ENCOUNTER — Encounter (HOSPITAL_COMMUNITY): Payer: Self-pay

## 2012-03-08 ENCOUNTER — Encounter (HOSPITAL_COMMUNITY)
Admission: RE | Admit: 2012-03-08 | Discharge: 2012-03-08 | Disposition: A | Discharge status: Home / Self Care | Payer: BC Managed Care – PPO | Source: Ambulatory Visit | Attending: Internal Medicine | Admitting: Internal Medicine

## 2012-03-08 DIAGNOSIS — K5190 Ulcerative colitis, unspecified, without complications: Secondary | ICD-10-CM | POA: Insufficient documentation

## 2012-03-08 MED ORDER — DIPHENHYDRAMINE HCL 25 MG PO CAPS
ORAL_CAPSULE | ORAL | Status: AC
  Filled 2012-03-08: qty 1

## 2012-03-08 MED ORDER — ACETAMINOPHEN 325 MG PO TABS
ORAL_TABLET | ORAL | Status: AC
  Filled 2012-03-08: qty 2

## 2012-03-08 MED ORDER — DIPHENHYDRAMINE HCL 25 MG PO TABS
25.0000 mg | ORAL_TABLET | ORAL | Status: AC
  Administered 2012-03-08: 25 mg via ORAL
  Filled 2012-03-08: qty 1

## 2012-03-08 MED ORDER — ACETAMINOPHEN 325 MG PO TABS
650.0000 mg | ORAL_TABLET | ORAL | Status: AC
  Administered 2012-03-08: 650 mg via ORAL

## 2012-03-08 MED ORDER — SODIUM CHLORIDE 0.9 % IV SOLN
INTRAVENOUS | Status: DC
  Administered 2012-03-08: 12:00:00 via INTRAVENOUS

## 2012-03-08 MED ORDER — SODIUM CHLORIDE 0.9 % IV SOLN
5.0000 mg/kg | INTRAVENOUS | Status: AC
  Administered 2012-03-08: 400 mg via INTRAVENOUS
  Filled 2012-03-08: qty 40

## 2012-03-20 ENCOUNTER — Other Ambulatory Visit: Payer: Self-pay | Admitting: Physician Assistant

## 2012-03-20 ENCOUNTER — Other Ambulatory Visit: Payer: Self-pay | Admitting: Internal Medicine

## 2012-03-22 ENCOUNTER — Other Ambulatory Visit: Payer: Self-pay | Admitting: *Deleted

## 2012-03-22 MED ORDER — FLUCONAZOLE 100 MG PO TABS: 100.0000 mg | ORAL_TABLET | Freq: Every day | ORAL | Status: AC

## 2012-03-23 ENCOUNTER — Other Ambulatory Visit: Payer: Self-pay | Admitting: *Deleted

## 2012-03-23 MED ORDER — METOPROLOL SUCCINATE ER 50 MG PO TB24: 50.0000 mg | ORAL_TABLET | Freq: Every day | ORAL | Status: DC

## 2012-04-12 ENCOUNTER — Encounter: Payer: Self-pay | Admitting: Internal Medicine

## 2012-04-12 ENCOUNTER — Other Ambulatory Visit (INDEPENDENT_AMBULATORY_CARE_PROVIDER_SITE_OTHER): Payer: BC Managed Care – PPO

## 2012-04-12 ENCOUNTER — Ambulatory Visit (INDEPENDENT_AMBULATORY_CARE_PROVIDER_SITE_OTHER): Payer: BC Managed Care – PPO | Admitting: Internal Medicine

## 2012-04-12 VITALS — BP 124/60 | HR 60 | Ht 59.0 in | Wt 181.0 lb

## 2012-04-12 DIAGNOSIS — K5190 Ulcerative colitis, unspecified, without complications: Secondary | ICD-10-CM

## 2012-04-12 DIAGNOSIS — D849 Immunodeficiency, unspecified: Secondary | ICD-10-CM

## 2012-04-12 DIAGNOSIS — K5100 Ulcerative (chronic) pancolitis without complications: Secondary | ICD-10-CM

## 2012-04-12 DIAGNOSIS — Z79899 Other long term (current) drug therapy: Secondary | ICD-10-CM

## 2012-04-12 LAB — CBC WITH DIFFERENTIAL/PLATELET
Basophils Absolute: 0 10*3/uL (ref 0.0–0.1)
Basophils Relative: 0.4 % (ref 0.0–3.0)
Eosinophils Absolute: 0 10*3/uL (ref 0.0–0.7)
Eosinophils Relative: 0.9 % (ref 0.0–5.0)
HCT: 41.5 % (ref 36.0–46.0)
Hemoglobin: 14 g/dL (ref 12.0–15.0)
Lymphocytes Relative: 18.9 % (ref 12.0–46.0)
Lymphs Abs: 0.9 10*3/uL (ref 0.7–4.0)
MCHC: 33.6 g/dL (ref 30.0–36.0)
MCV: 95.4 fl (ref 78.0–100.0)
Monocytes Absolute: 0.3 10*3/uL (ref 0.1–1.0)
Monocytes Relative: 6.9 % (ref 3.0–12.0)
Neutro Abs: 3.6 10*3/uL (ref 1.4–7.7)
Neutrophils Relative %: 72.9 % (ref 43.0–77.0)
Platelets: 249 10*3/uL (ref 150.0–400.0)
RBC: 4.36 Mil/uL (ref 3.87–5.11)
RDW: 14.7 % — ABNORMAL HIGH (ref 11.5–14.6)
WBC: 5 10*3/uL (ref 4.5–10.5)

## 2012-04-12 LAB — BASIC METABOLIC PANEL
BUN: 15 mg/dL (ref 6–23)
CO2: 29 mEq/L (ref 19–32)
Calcium: 9.7 mg/dL (ref 8.4–10.5)
Chloride: 103 mEq/L (ref 96–112)
Creatinine, Ser: 0.6 mg/dL (ref 0.4–1.2)
GFR: 114.16 mL/min (ref >60.00)
Glucose, Bld: 92 mg/dL (ref 70–99)
Potassium: 4.4 mEq/L (ref 3.5–5.1)
Sodium: 139 mEq/L (ref 135–145)

## 2012-04-12 NOTE — Patient Instructions (Addendum)
Your physician has requested that you go to the basement for the following lab work before leaving today: CBC, BMET Please follow up with Dr Juanda Chance in 3 months. CC: Dr Lesle Chris

## 2012-04-12 NOTE — Progress Notes (Signed)
Lauren [name] 1961/02/02 MRN [id number]    History of Present Illness:  This is a 51 year old white female with ulcerative colitis of more than 10 years duration. She is currently in remission. She has been on a prednisone taper. She is currently on prednisone 4 mg a day. Her last office visit was in [name] 09 2012. She is also on Imuran and mesalamine. She has been on Remicade infusions every eight weeks. Her recall colonoscopy should be in May 2015. Other medical problems include history of SVT, history of meningioma and history of von Willebrand's disease. Her last hemoglobin was 13.9 with a hematocrit of 41.3. Her last B12 level was 402. Her potassium level was 3.7. She has soft stools with occasional urgency and incontinence.   Past Medical History  Diagnosis Date  . Ulcerative colitis   . SVT (supraventricular tachycardia)   . Personal history of meningioma of the brain   . Von Willebrands disease   . Anxiety and depression   . Vitamin B12 deficiency   . GERD (gastroesophageal reflux disease)   . External hemorrhoids   . Tennis elbow    Past Surgical History  Procedure Date  . Brain meningioma excision 05/2007  . Implantation bone anchored hearing aid   . Cardiac electrophysiology study and ablation 10/22/09    reports that she has never smoked. She has never used smokeless tobacco. She reports that she does not drink alcohol or use illicit drugs. family history includes Diabetes in her maternal uncle; Heart disease in her maternal grandfather and mother; and Stroke in her mother.  There is no history of Colon cancer. Allergies  Allergen Reactions  . Atenolol   . Cephalexin         Review of Systems: Denies heartburn dysphagia chest pain  The remainder of the 10 point ROS is negative except as outlined in H&P   Physical Exam: General appearance  Well developed, in no distress. Eyes- non icteric. HEENT nontraumatic, normocephalic. Mouth no lesions, tongue papillated, no  cheilosis. Neck supple without adenopathy, thyroid not enlarged, no carotid bruits, no JVD. Lungs Clear to auscultation bilaterally. Cor normal S1, normal S2, regular rhythm, no murmur,  quiet precordium. Abdomen: Soft, nontender with normoactive bowel sounds. Rectal: Large external hemorrhoids. Hemoccult positive stool. Extremities no pedal edema. Skin no lesions. Neurological alert and oriented x 3. Psychological normal mood and affect.  Assessment and Plan:  Problem #1 Ulcerative colitis in symptomatic remission. She will continue a very slow prednisone taper by  0.5 mg every four weeks. I will see her in three months. She will continue Imuran and mesalamine. We will be checking her labs today as well.   04/12/2012 [name]

## 2012-04-15 ENCOUNTER — Other Ambulatory Visit: Payer: Self-pay | Admitting: *Deleted

## 2012-04-15 DIAGNOSIS — E876 Hypokalemia: Secondary | ICD-10-CM

## 2012-04-26 ENCOUNTER — Other Ambulatory Visit: Payer: Self-pay | Admitting: *Deleted

## 2012-04-26 ENCOUNTER — Other Ambulatory Visit: Payer: Self-pay | Admitting: Internal Medicine

## 2012-04-26 MED ORDER — PREDNISONE 1 MG PO TABS: ORAL_TABLET | ORAL | Status: DC

## 2012-04-26 MED ORDER — MESALAMINE 1.2 G PO TBEC: DELAYED_RELEASE_TABLET | ORAL | Status: DC

## 2012-04-26 MED ORDER — AZATHIOPRINE 50 MG PO TABS: 100.0000 mg | ORAL_TABLET | Freq: Every day | ORAL | Status: DC

## 2012-05-03 ENCOUNTER — Encounter (HOSPITAL_COMMUNITY): Payer: Self-pay

## 2012-05-03 ENCOUNTER — Encounter (HOSPITAL_COMMUNITY)
Admission: RE | Admit: 2012-05-03 | Discharge: 2012-05-03 | Disposition: A | Discharge status: Home / Self Care | Payer: BC Managed Care – PPO | Source: Ambulatory Visit | Attending: Internal Medicine | Admitting: Internal Medicine

## 2012-05-03 DIAGNOSIS — K5190 Ulcerative colitis, unspecified, without complications: Secondary | ICD-10-CM | POA: Insufficient documentation

## 2012-05-03 MED ORDER — DIPHENHYDRAMINE HCL 25 MG PO TABS
25.0000 mg | ORAL_TABLET | ORAL | Status: AC
  Administered 2012-05-03: 25 mg via ORAL
  Filled 2012-05-03 (×2): qty 1

## 2012-05-03 MED ORDER — SODIUM CHLORIDE 0.9 % IV SOLN
5.0000 mg/kg | INTRAVENOUS | Status: AC
  Administered 2012-05-03: 400 mg via INTRAVENOUS
  Filled 2012-05-03: qty 40

## 2012-05-03 MED ORDER — SODIUM CHLORIDE 0.9 % IV SOLN
INTRAVENOUS | Status: DC
  Administered 2012-05-03: 11:00:00 via INTRAVENOUS

## 2012-05-03 MED ORDER — ACETAMINOPHEN 325 MG PO TABS
650.0000 mg | ORAL_TABLET | ORAL | Status: AC
  Administered 2012-05-03: 650 mg via ORAL
  Filled 2012-05-03: qty 2

## 2012-05-07 ENCOUNTER — Other Ambulatory Visit (INDEPENDENT_AMBULATORY_CARE_PROVIDER_SITE_OTHER): Payer: BC Managed Care – PPO

## 2012-05-07 DIAGNOSIS — E876 Hypokalemia: Secondary | ICD-10-CM

## 2012-05-07 LAB — POTASSIUM: Potassium: 3.7 mEq/L (ref 3.5–5.1)

## 2012-05-08 ENCOUNTER — Other Ambulatory Visit: Payer: Self-pay | Admitting: *Deleted

## 2012-05-08 DIAGNOSIS — E876 Hypokalemia: Secondary | ICD-10-CM

## 2012-05-23 ENCOUNTER — Other Ambulatory Visit: Payer: Self-pay | Admitting: Radiology

## 2012-05-23 MED ORDER — METOPROLOL SUCCINATE ER 50 MG PO TB24: 50.0000 mg | ORAL_TABLET | Freq: Every day | ORAL | Status: DC

## 2012-06-04 ENCOUNTER — Other Ambulatory Visit: Payer: Self-pay | Admitting: Obstetrics and Gynecology

## 2012-06-04 DIAGNOSIS — Z1231 Encounter for screening mammogram for malignant neoplasm of breast: Secondary | ICD-10-CM

## 2012-06-05 ENCOUNTER — Ambulatory Visit (INDEPENDENT_AMBULATORY_CARE_PROVIDER_SITE_OTHER): Payer: BC Managed Care – PPO | Admitting: Family Medicine

## 2012-06-05 VITALS — BP 124/78 | HR 68 | Temp 98.2°F | Resp 17 | Ht 60.0 in | Wt 181.0 lb

## 2012-06-05 DIAGNOSIS — Z0000 Encounter for general adult medical examination without abnormal findings: Secondary | ICD-10-CM

## 2012-06-05 DIAGNOSIS — K5190 Ulcerative colitis, unspecified, without complications: Secondary | ICD-10-CM

## 2012-06-05 LAB — COMPREHENSIVE METABOLIC PANEL
ALT: 12 U/L (ref 0–35)
AST: 20 U/L (ref 0–37)
Albumin: 4.5 g/dL (ref 3.5–5.2)
Alkaline Phosphatase: 59 U/L (ref 39–117)
BUN: 10 mg/dL (ref 6–23)
CO2: 31 mEq/L (ref 19–32)
Calcium: 9.8 mg/dL (ref 8.4–10.5)
Chloride: 101 mEq/L (ref 96–112)
Creat: 0.61 mg/dL (ref 0.50–1.10)
Glucose, Bld: 79 mg/dL (ref 70–99)
Potassium: 3.8 mEq/L (ref 3.5–5.3)
Sodium: 139 mEq/L (ref 135–145)
Total Bilirubin: 0.7 mg/dL (ref 0.3–1.2)
Total Protein: 7.6 g/dL (ref 6.0–8.3)

## 2012-06-05 LAB — POCT CBC
Granulocyte percent: 57.3 %G (ref 37–80)
HCT, POC: 45.4 % (ref 37.7–47.9)
Hemoglobin: 14.3 g/dL (ref 12.2–16.2)
Lymph, poc: 1.7 (ref 0.6–3.4)
MCH, POC: 31 pg (ref 27–31.2)
MCHC: 31.5 g/dL — AB (ref 31.8–35.4)
MCV: 98.3 fL — AB (ref 80–97)
MID (cbc): 0.4 (ref 0–0.9)
MPV: 9 fL (ref 0–99.8)
POC Granulocyte: 2.8 (ref 2–6.9)
POC LYMPH PERCENT: 34.5 %L (ref 10–50)
POC MID %: 8.2 %M (ref 0–12)
Platelet Count, POC: 237 10*3/uL (ref 142–424)
RBC: 4.62 M/uL (ref 4.04–5.48)
RDW, POC: 13.5 %
WBC: 4.8 10*3/uL (ref 4.6–10.2)

## 2012-06-05 LAB — POCT SEDIMENTATION RATE: POCT SED RATE: 13 mm/hr (ref 0–22)

## 2012-06-05 LAB — LIPID PANEL
Cholesterol: 215 mg/dL — ABNORMAL HIGH (ref 0–200)
HDL: 62 mg/dL (ref >39)
LDL Cholesterol: 134 mg/dL — ABNORMAL HIGH (ref 0–99)
Total CHOL/HDL Ratio: 3.5 Ratio
Triglycerides: 95 mg/dL (ref <150)
VLDL: 19 mg/dL (ref 0–40)

## 2012-06-05 LAB — VITAMIN B12: Vitamin B-12: 620 pg/mL (ref 211–911)

## 2012-06-05 NOTE — Progress Notes (Signed)
@[email or web address] @  Patient ID: Lauren [name] MRN: [id number], DOB: 1961-05-05, 51 y.o. Date of Encounter: 06/05/2012, 2:59 PM  Primary Physician: Lucilla Edin, MD  Chief Complaint: Physical (CPE)  HPI: 50 y.o. y/o female with history of noted below here for CPE.  Doing well. Notes some tenderness lateral lower ribs Has bleeding disorder Left kidney cyst by U/S and MRI, chronic hematuria Also, has had small abnormalities in spleen on MRI  LMP:  3-4 years ago Pap:  Last December MMG: next week Review of Systems: Consitutional: No fever, chills, fatigue, night sweats, lymphadenopathy, or weight changes. Eyes: No visual changes, eye redness, or discharge. ENT/Mouth: Ears: No otalgia, tinnitus, hearing loss, discharge. Nose: No congestion, rhinorrhea, sinus pain, or epistaxis. Throat: No sore throat, post nasal drip, or teeth pain. Cardiovascular: No CP, palpitations, diaphoresis, DOE, edema, orthopnea, PND. Respiratory: No cough, hemoptysis, SOB, or wheezing. Gastrointestinal: No anorexia, dysphagia, reflux, pain, nausea, vomiting, hematemesis, diarrhea, constipation, BRBPR, or melena. Breast: No discharge, pain, swelling, or mass. Genitourinary: No dysuria, frequency, urgency, hematuria, incontinence, nocturia, amenorrhea, vaginal discharge, pruritis, burning, abnormal bleeding, or pain. Musculoskeletal: No decreased ROM, myalgias, stiffness, joint swelling, or weakness. Skin: No rash, erythema, lesion changes, pain, warmth, jaundice, or pruritis. Neurological: No headache, dizziness, syncope, seizures, tremors, memory loss, coordination problems, or paresthesias. Psychological: No anxiety, depression, hallucinations, SI/HI. Endocrine: No fatigue, polydipsia, polyphagia, polyuria, or known diabetes. All other systems were reviewed and are otherwise negative.  Past Medical History  Diagnosis Date  . Ulcerative colitis   . SVT (supraventricular tachycardia)   . Personal history of  meningioma of the brain   . Von Willebrands disease   . Anxiety and depression   . Vitamin B12 deficiency   . GERD (gastroesophageal reflux disease)   . External hemorrhoids   . Tennis elbow      Past Surgical History  Procedure Date  . Brain meningioma excision 05/2007  . Implantation bone anchored hearing aid   . Cardiac electrophysiology study and ablation 10/22/09    Home Meds:  Prior to Admission medications   Medication Sig Start Date End Date Taking? Authorizing Provider  ergocalciferol (VITAMIN D2) 50000 UNITS capsule Take 50,000 Units by mouth once a week.    Yes Historical Provider, MD  FLUoxetine (PROZAC) 20 MG tablet Take 20 mg by mouth daily.     Yes Historical Provider, MD  hydrocortisone (ANUSOL-HC) 25 MG suppository Place 25 mg rectally 2 (two) times daily as needed. Insert 1 suppository into the rectum twice daily. 08/21/11  Yes Hart Carwin, MD  hydrocortisone Laurance Flatten) 100 MG/60ML enema Use as directed 07/11/11  Yes Hart Carwin, MD  inFLIXimab (REMICADE) 100 MG injection Weight: 172      DX: Ulcerative Colitis Dosing instructions: 5 mg/kg every 8 weeks. Pre Medication: Benadryl 25-50 mg by mouth and/or Acetaminophen 650 mg by mouth. Date of Last TB Skin test: 10/18/10      Results: Negative 06/02/11  Yes Hart Carwin, MD  mesalamine Theodosia Blender) 1.2 G EC tablet Take 4 tablets by mouth once daily 04/26/12  Yes Hart Carwin, MD  metoprolol succinate (TOPROL-XL) 50 MG 24 hr tablet Take 1 tablet (50 mg total) by mouth daily. NEEDS OFFICE VISIT 05/23/12  Yes Sondra Barges, PA-C  Multiple Vitamin (MULTIVITAMIN) capsule Take 1 capsule by mouth daily.     Yes Historical Provider, MD  potassium chloride (KLOR-CON) 20 MEQ packet Take 20 mEq by mouth 1 day or 1 dose.   Yes Historical  Provider, MD  potassium chloride SA (K-DUR,KLOR-CON) 20 MEQ tablet TAKE 1 TABLET BY MOUTH DAILY 04/26/12  Yes Hart Carwin, MD  predniSONE (DELTASONE) 1 MG tablet Take as directed 04/26/12  Yes Hart Carwin, MD  azaTHIOprine (IMURAN) 50 MG tablet Take 2 tablets (100 mg total) by mouth daily. 04/26/12   Hart Carwin, MD    Allergies:  Allergies  Allergen Reactions  . Atenolol   . Cephalexin     History   Social History  . Marital Status: Married    Spouse Name: N/A    Number of Children: 1  . 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 Smoking status: Never Smoker   . Smokeless tobacco: Never Used  . Alcohol Use: No  . Drug Use: No  . Sexually Active: Not on file   Other Topics Concern  . Not on file   Social History Narrative  . No narrative on file    Family History  Problem Relation Age of Onset  . Heart disease Maternal Grandfather   . Heart disease Mother   . Stroke Mother   . Diabetes Maternal Uncle   . Colon cancer Neg Hx   maternal uncle had hypertension  Physical Exam: Blood pressure 124/78, pulse 68, temperature 98.2 F (36.8 C), temperature source Oral, resp. rate 17, height 5' (1.524 m), weight 181 lb (82.101 kg), SpO2 95.00%., Body mass index is 35.35 kg/(m^2). General: Well developed, well nourished, in no acute distress. HEENT: Normocephalic, atraumatic. Conjunctiva pink, sclera non-icteric. Pupils 2 mm constricting to 1 mm, round, regular, and equally reactive to light and accomodation. EOMI. Internal auditory canal clear. TMs with good cone of light and without pathology. Nasal mucosa pink. Nares are without discharge. No sinus tenderness. Oral mucosa pink. Dentition good. Pharynx without exudate.   Neck: Supple. Trachea midline. No thyromegaly. Full ROM. No lymphadenopathy. Lungs: Clear to auscultation bilaterally without wheezes, rales, or rhonchi. Breathing is of normal effort and unlabored. Cardiovascular: RRR with S1 S2. No murmurs, rubs, or gallops appreciated. Distal pulses 2+ symmetrically. No carotid or abdominal bruits. Abdomen: Soft, non-tender,  non-distended with normoactive bowel sounds. No hepatosplenomegaly or masses. No rebound/guarding. No CVA tenderness. Without hernias.  Musculoskeletal: Full range of motion and 5/5 strength throughout. Without swelling, atrophy, tenderness, crepitus, or warmth. Extremities without clubbing, cyanosis, or edema. Calves supple. Skin: Warm and moist without erythema, ecchymosis, wounds, or rash. Neuro: A+Ox3. CN II-XII grossly intact. Moves all extremities spontaneously. Full sensation throughout. Normal gait. DTR 2+ throughout upper and lower extremities. Finger to nose intact. Psych:  Responds to questions appropriately with a normal affect.    Assessment/Plan:  51 y.o. y/o female here for CPE 1. Ulcerative colitis  POCT CBC, POCT SEDIMENTATION RATE, Lipid panel, Comprehensive metabolic panel, Vitamin B12   -sore areas on sides without any palpable abnormality==> likely fibromyalgia  Signed, Elvina Sidle, MD 06/05/2012 2:59 PM

## 2012-06-26 ENCOUNTER — Other Ambulatory Visit: Payer: Self-pay | Admitting: *Deleted

## 2012-06-26 DIAGNOSIS — K5190 Ulcerative colitis, unspecified, without complications: Secondary | ICD-10-CM

## 2012-06-28 ENCOUNTER — Encounter (HOSPITAL_COMMUNITY): Payer: Self-pay

## 2012-06-28 ENCOUNTER — Encounter (HOSPITAL_COMMUNITY)
Admission: RE | Admit: 2012-06-28 | Discharge: 2012-06-28 | Disposition: A | Discharge status: Home / Self Care | Payer: BC Managed Care – PPO | Source: Ambulatory Visit | Attending: Internal Medicine | Admitting: Internal Medicine

## 2012-06-28 ENCOUNTER — Ambulatory Visit
Admission: RE | Admit: 2012-06-28 | Discharge: 2012-06-28 | Disposition: A | Discharge status: Home / Self Care | Payer: BC Managed Care – PPO | Source: Ambulatory Visit | Attending: Obstetrics and Gynecology | Admitting: Obstetrics and Gynecology

## 2012-06-28 VITALS — BP 114/60 | HR 64 | Temp 97.5°F | Resp 16 | Ht 59.0 in | Wt 184.0 lb

## 2012-06-28 DIAGNOSIS — Z1231 Encounter for screening mammogram for malignant neoplasm of breast: Secondary | ICD-10-CM

## 2012-06-28 DIAGNOSIS — K5190 Ulcerative colitis, unspecified, without complications: Secondary | ICD-10-CM

## 2012-06-28 MED ORDER — ACETAMINOPHEN 325 MG PO TABS
650.0000 mg | ORAL_TABLET | ORAL | Status: DC
  Administered 2012-06-28: 650 mg via ORAL
  Filled 2012-06-28: qty 2

## 2012-06-28 MED ORDER — DIPHENHYDRAMINE HCL 25 MG PO CAPS
25.0000 mg | ORAL_CAPSULE | ORAL | Status: DC
  Administered 2012-06-28: 25 mg via ORAL
  Filled 2012-06-28: qty 1

## 2012-06-28 MED ORDER — SODIUM CHLORIDE 0.9 % IV SOLN
5.0000 mg/kg | INTRAVENOUS | Status: DC
  Administered 2012-06-28: 400 mg via INTRAVENOUS
  Filled 2012-06-28: qty 40

## 2012-06-28 MED ORDER — SODIUM CHLORIDE 0.9 % IV SOLN
INTRAVENOUS | Status: DC
  Administered 2012-06-28: 11:00:00 via INTRAVENOUS

## 2012-07-01 ENCOUNTER — Ambulatory Visit: Payer: BC Managed Care – PPO

## 2012-07-05 ENCOUNTER — Ambulatory Visit: Payer: BC Managed Care – PPO

## 2012-07-19 ENCOUNTER — Encounter: Payer: Self-pay | Admitting: Internal Medicine

## 2012-07-19 ENCOUNTER — Ambulatory Visit (INDEPENDENT_AMBULATORY_CARE_PROVIDER_SITE_OTHER): Payer: BC Managed Care – PPO | Admitting: Internal Medicine

## 2012-07-19 VITALS — BP 120/80 | HR 68 | Ht 59.0 in | Wt 184.1 lb

## 2012-07-19 DIAGNOSIS — E876 Hypokalemia: Secondary | ICD-10-CM

## 2012-07-19 DIAGNOSIS — D680 Von Willebrand's disease: Secondary | ICD-10-CM

## 2012-07-19 DIAGNOSIS — D849 Immunodeficiency, unspecified: Secondary | ICD-10-CM

## 2012-07-19 DIAGNOSIS — K5100 Ulcerative (chronic) pancolitis without complications: Secondary | ICD-10-CM

## 2012-07-19 DIAGNOSIS — Z79899 Other long term (current) drug therapy: Secondary | ICD-10-CM

## 2012-07-19 NOTE — Patient Instructions (Addendum)
Your physician has requested that you go to the basement for the following lab work 08/16/12: BMET  Please follow up with Dr Juanda Chance in 3 months.  CC: Dr Earl Lites

## 2012-07-19 NOTE — Progress Notes (Signed)
Lauren [name] Mar 05, 1961 MRN [id number]        History of Present Illness:  This is a 51 year old white female with ulcerative colitis of  15 years duration with moderate activity until recently when she went into remission and was able to taper off her prednisone. Her last dose of prednisone is tomorrow. She still continues Imuran 100 mg daily, mesalamine 4.8 g daily, Remicade 5 mg per kilogram every 8 weeks. She is having 2-3 bowel movements a day without blood. There has been no diarrhea or abdominal pain. She has a history of SVT, status post ablation. History of meningioma. History of von Willebrand's disease and hypocalcemia. Last potassium 3.8    Past Medical History  Diagnosis Date  . Ulcerative colitis   . SVT (supraventricular tachycardia)   . Personal history of meningioma of the brain   . Von Willebrands disease   . Anxiety and depression   . Vitamin B12 deficiency   . GERD (gastroesophageal reflux disease)   . External hemorrhoids   . Tennis elbow    Past Surgical History  Procedure Date  . Brain meningioma excision 05/2007  . Implantation bone anchored hearing aid   . Cardiac electrophysiology study and ablation 10/22/09    reports that she has never smoked. She has never used smokeless tobacco. She reports that she does not drink alcohol or use illicit drugs. family history includes Diabetes in her maternal uncle; Heart disease in her maternal grandfather and mother; and Stroke in her mother.  There is no history of Colon cancer. Allergies  Allergen Reactions  . Atenolol   . Cephalexin         Review of Systems: Denies abdominal pain rectal bleeding  The remainder of the 10 point ROS is negative except as outlined in H&P   Physical Exam: General appearance  Well developed, in no distress. Eyes- non icteric. HEENT nontraumatic, normocephalic. Mouth no lesions, tongue papillated, no cheilosis. Neck supple without adenopathy, thyroid not enlarged, no  carotid bruits, no JVD. Lungs Clear to auscultation bilaterally. Cor normal S1, normal S2, regular rhythm, no murmur,  quiet precordium. Abdomen: Nontender abdomen with normoactive bowel sounds. No distention Rectal: Not done Extremities no pedal edema. Skin no lesions. Neurological alert and oriented x 3. Psychological normal mood and affect.  Assessment and Plan:  Ulcerative colitis in remission. Completed  prednisone taper. Maintenance medications include Imuran, Remicade and mesalamine. She will continue  her maintenance medications and will return in 3 months. She will be due for TB skin test in February 2014. We will check her potassium level in 4 weeks. Recall colonoscopy in May 2015. At some point, if she remains in remission, we will start tapering her Imuran.   07/19/2012 Lauren [name]

## 2012-07-27 ENCOUNTER — Other Ambulatory Visit: Payer: Self-pay | Admitting: Family Medicine

## 2012-07-29 ENCOUNTER — Other Ambulatory Visit: Payer: Self-pay | Admitting: *Deleted

## 2012-07-29 MED ORDER — MESALAMINE 1.2 G PO TBEC: DELAYED_RELEASE_TABLET | ORAL | Status: DC

## 2012-07-29 MED ORDER — AZATHIOPRINE 50 MG PO TABS: 100.0000 mg | ORAL_TABLET | Freq: Every day | ORAL | Status: DC

## 2012-08-16 ENCOUNTER — Other Ambulatory Visit (INDEPENDENT_AMBULATORY_CARE_PROVIDER_SITE_OTHER): Payer: BC Managed Care – PPO

## 2012-08-16 DIAGNOSIS — E876 Hypokalemia: Secondary | ICD-10-CM

## 2012-08-16 LAB — BASIC METABOLIC PANEL
BUN: 16 mg/dL (ref 6–23)
CO2: 32 mEq/L (ref 19–32)
Calcium: 9.2 mg/dL (ref 8.4–10.5)
Chloride: 104 mEq/L (ref 96–112)
Creatinine, Ser: 0.8 mg/dL (ref 0.4–1.2)
GFR: 86.43 mL/min (ref >60.00)
Glucose, Bld: 94 mg/dL (ref 70–99)
Potassium: 3.5 mEq/L (ref 3.5–5.1)
Sodium: 140 mEq/L (ref 135–145)

## 2012-08-19 ENCOUNTER — Telehealth: Payer: Self-pay | Admitting: *Deleted

## 2012-08-19 MED ORDER — POTASSIUM CHLORIDE CRYS ER 20 MEQ PO TBCR: 20.0000 meq | EXTENDED_RELEASE_TABLET | Freq: Every day | ORAL | Status: DC

## 2012-08-19 NOTE — Telephone Encounter (Signed)
Per patient, Dr Juanda Chance told her at her previous office visit that she no longer needed to take potassium since her levels had normalized. However, patient had labs 08/16/12. Dr Juanda Chance asked that she begin taking Potassium again due to slight decrease in levels. I will send a new script for patient.

## 2012-08-23 ENCOUNTER — Encounter (HOSPITAL_COMMUNITY): Payer: Self-pay

## 2012-08-23 ENCOUNTER — Encounter (HOSPITAL_COMMUNITY)
Admission: RE | Admit: 2012-08-23 | Discharge: 2012-08-23 | Disposition: A | Discharge status: Home / Self Care | Payer: BC Managed Care – PPO | Source: Ambulatory Visit | Attending: Internal Medicine | Admitting: Internal Medicine

## 2012-08-23 VITALS — BP 137/72 | HR 62 | Temp 97.0°F | Resp 16 | Ht 59.0 in | Wt 186.1 lb

## 2012-08-23 DIAGNOSIS — K5190 Ulcerative colitis, unspecified, without complications: Secondary | ICD-10-CM

## 2012-08-23 MED ORDER — SODIUM CHLORIDE 0.9 % IV SOLN
INTRAVENOUS | Status: AC
  Administered 2012-08-23: 10:00:00 via INTRAVENOUS

## 2012-08-23 MED ORDER — SODIUM CHLORIDE 0.9 % IV SOLN
5.0000 mg/kg | INTRAVENOUS | Status: AC
  Administered 2012-08-23: 400 mg via INTRAVENOUS
  Filled 2012-08-23: qty 40

## 2012-08-23 MED ORDER — DIPHENHYDRAMINE HCL 25 MG PO CAPS
25.0000 mg | ORAL_CAPSULE | ORAL | Status: AC
  Administered 2012-08-23: 25 mg via ORAL
  Filled 2012-08-23: qty 1

## 2012-08-23 MED ORDER — ACETAMINOPHEN 325 MG PO TABS
650.0000 mg | ORAL_TABLET | ORAL | Status: AC
  Administered 2012-08-23: 650 mg via ORAL
  Filled 2012-08-23: qty 2

## 2012-09-13 ENCOUNTER — Other Ambulatory Visit (INDEPENDENT_AMBULATORY_CARE_PROVIDER_SITE_OTHER): Payer: BC Managed Care – PPO

## 2012-09-13 ENCOUNTER — Other Ambulatory Visit: Payer: Self-pay | Admitting: Internal Medicine

## 2012-09-13 DIAGNOSIS — E876 Hypokalemia: Secondary | ICD-10-CM

## 2012-09-13 LAB — POTASSIUM: Potassium: 3.7 mEq/L (ref 3.5–5.1)

## 2012-09-16 ENCOUNTER — Telehealth: Payer: Self-pay | Admitting: *Deleted

## 2012-09-16 MED ORDER — POTASSIUM CHLORIDE CRYS ER 20 MEQ PO TBCR: 20.0000 meq | EXTENDED_RELEASE_TABLET | Freq: Two times a day (BID) | ORAL | Status: DC

## 2012-09-16 NOTE — Telephone Encounter (Signed)
rx sent to patient's pharmacy. 

## 2012-10-12 ENCOUNTER — Other Ambulatory Visit: Payer: Self-pay

## 2012-10-17 ENCOUNTER — Other Ambulatory Visit: Payer: Self-pay | Admitting: Internal Medicine

## 2012-10-17 ENCOUNTER — Other Ambulatory Visit: Payer: Self-pay | Admitting: *Deleted

## 2012-10-17 DIAGNOSIS — K5190 Ulcerative colitis, unspecified, without complications: Secondary | ICD-10-CM

## 2012-10-18 ENCOUNTER — Encounter (HOSPITAL_COMMUNITY): Payer: Self-pay

## 2012-10-18 ENCOUNTER — Encounter (HOSPITAL_COMMUNITY)
Admission: RE | Admit: 2012-10-18 | Discharge: 2012-10-18 | Disposition: A | Discharge status: Home / Self Care | Payer: BC Managed Care – PPO | Source: Ambulatory Visit | Attending: Internal Medicine | Admitting: Internal Medicine

## 2012-10-18 ENCOUNTER — Other Ambulatory Visit: Payer: Self-pay | Admitting: *Deleted

## 2012-10-18 VITALS — BP 112/67 | HR 54 | Temp 97.3°F | Resp 16 | Ht 59.0 in | Wt 182.0 lb

## 2012-10-18 DIAGNOSIS — K5190 Ulcerative colitis, unspecified, without complications: Secondary | ICD-10-CM | POA: Insufficient documentation

## 2012-10-18 MED ORDER — SODIUM CHLORIDE 0.9 % IV SOLN: INTRAVENOUS | Status: DC

## 2012-10-18 MED ORDER — DIPHENHYDRAMINE HCL 25 MG PO CAPS
25.0000 mg | ORAL_CAPSULE | ORAL | Status: DC
  Administered 2012-10-18: 25 mg via ORAL
  Filled 2012-10-18: qty 1

## 2012-10-18 MED ORDER — ACETAMINOPHEN 325 MG PO TABS
650.0000 mg | ORAL_TABLET | ORAL | Status: DC
  Administered 2012-10-18: 650 mg via ORAL
  Filled 2012-10-18: qty 2

## 2012-10-18 MED ORDER — SODIUM CHLORIDE 0.9 % IV SOLN
5.0000 mg/kg | INTRAVENOUS | Status: DC
  Administered 2012-10-18: 400 mg via INTRAVENOUS
  Filled 2012-10-18: qty 40

## 2012-10-22 ENCOUNTER — Other Ambulatory Visit: Payer: Self-pay | Admitting: *Deleted

## 2012-10-22 MED ORDER — METOPROLOL SUCCINATE ER 50 MG PO TB24: 50.0000 mg | ORAL_TABLET | Freq: Every day | ORAL | Status: DC

## 2012-10-29 ENCOUNTER — Telehealth: Payer: Self-pay

## 2012-10-29 MED ORDER — METOPROLOL SUCCINATE ER 50 MG PO TB24: 50.0000 mg | ORAL_TABLET | Freq: Every day | ORAL | Status: DC

## 2012-10-29 NOTE — Telephone Encounter (Signed)
Patient was only prescribed half of her medication because she was told she needed an ov, but she just had a CPE with Dr L and is wondering why she needs to come in again.  Best [phone]

## 2012-10-29 NOTE — Telephone Encounter (Signed)
Unfortunately there is not any documentation of her metoprolol from her last office visit. This was in October apologized for this, and sent in with 2 refills.

## 2012-11-01 ENCOUNTER — Ambulatory Visit: Payer: BC Managed Care – PPO | Admitting: Internal Medicine

## 2012-11-16 ENCOUNTER — Other Ambulatory Visit: Payer: Self-pay | Admitting: Internal Medicine

## 2012-11-22 ENCOUNTER — Other Ambulatory Visit (INDEPENDENT_AMBULATORY_CARE_PROVIDER_SITE_OTHER): Payer: BC Managed Care – PPO

## 2012-11-22 ENCOUNTER — Ambulatory Visit (INDEPENDENT_AMBULATORY_CARE_PROVIDER_SITE_OTHER): Payer: BC Managed Care – PPO | Admitting: Internal Medicine

## 2012-11-22 ENCOUNTER — Encounter: Payer: Self-pay | Admitting: Internal Medicine

## 2012-11-22 VITALS — BP 130/70 | HR 66 | Ht 59.0 in | Wt 182.6 lb

## 2012-11-22 DIAGNOSIS — E876 Hypokalemia: Secondary | ICD-10-CM

## 2012-11-22 DIAGNOSIS — D849 Immunodeficiency, unspecified: Secondary | ICD-10-CM

## 2012-11-22 DIAGNOSIS — K5100 Ulcerative (chronic) pancolitis without complications: Secondary | ICD-10-CM

## 2012-11-22 LAB — CBC WITH DIFFERENTIAL/PLATELET
Basophils Absolute: 0 10*3/uL (ref 0.0–0.1)
Basophils Relative: 0.5 % (ref 0.0–3.0)
Eosinophils Absolute: 0.1 10*3/uL (ref 0.0–0.7)
Eosinophils Relative: 1.8 % (ref 0.0–5.0)
HCT: 39.8 % (ref 36.0–46.0)
Hemoglobin: 13.7 g/dL (ref 12.0–15.0)
Lymphocytes Relative: 33.2 % (ref 12.0–46.0)
Lymphs Abs: 1.5 10*3/uL (ref 0.7–4.0)
MCHC: 34.5 g/dL (ref 30.0–36.0)
MCV: 90.7 fl (ref 78.0–100.0)
Monocytes Absolute: 0.5 10*3/uL (ref 0.1–1.0)
Monocytes Relative: 12 % (ref 3.0–12.0)
Neutro Abs: 2.3 10*3/uL (ref 1.4–7.7)
Neutrophils Relative %: 52.5 % (ref 43.0–77.0)
Platelets: 228 10*3/uL (ref 150.0–400.0)
RBC: 4.39 Mil/uL (ref 3.87–5.11)
RDW: 13.5 % (ref 11.5–14.6)
WBC: 4.4 10*3/uL — ABNORMAL LOW (ref 4.5–10.5)

## 2012-11-22 LAB — COMPREHENSIVE METABOLIC PANEL
ALT: 16 U/L (ref 0–35)
AST: 21 U/L (ref 0–37)
Albumin: 4 g/dL (ref 3.5–5.2)
Alkaline Phosphatase: 60 U/L (ref 39–117)
BUN: 13 mg/dL (ref 6–23)
CO2: 27 mEq/L (ref 19–32)
Calcium: 9.2 mg/dL (ref 8.4–10.5)
Chloride: 102 mEq/L (ref 96–112)
Creatinine, Ser: 0.6 mg/dL (ref 0.4–1.2)
GFR: 107.55 mL/min (ref >60.00)
Glucose, Bld: 89 mg/dL (ref 70–99)
Potassium: 3.5 mEq/L (ref 3.5–5.1)
Sodium: 136 mEq/L (ref 135–145)
Total Bilirubin: 0.5 mg/dL (ref 0.3–1.2)
Total Protein: 7.4 g/dL (ref 6.0–8.3)

## 2012-11-22 LAB — SEDIMENTATION RATE: Sed Rate: 15 mm/hr (ref 0–22)

## 2012-11-22 LAB — POTASSIUM: Potassium: 3.5 mEq/L (ref 3.5–5.1)

## 2012-11-22 NOTE — Progress Notes (Signed)
BONNYE HALLE 08-09-1961 MRN [id number]   History of Present Illness:  This is a 52 year old white female with predominantly left sided  ulcerative colitis of greater than 16 years duration, . Her last office visit was in November 2013. She has been able to taper off her prednisone and remains only on Imuran 100 mg daily, Remicade 5 mg/kg every 8 weeks and mesalamine 4.8 g daily. She has no complaints today. Specifically, there has been no rectal bleeding, diarrhea or abdominal pain. She is due for a TB skin test today. She has a history of von Willebrand's disease, hypokalemia, meningioma and SVT status post ablation. Her last colonoscopy in May 2012 showed mild colitis 0-30 cm. Biopsies showed chronic active colitis with mild glandular atypia. There was an inflammatory polyp which was removed. There was chronic colitis between 30-40 cm and normal mucosa above 40 cm.   Past Medical History  Diagnosis Date  . Ulcerative colitis   . SVT (supraventricular tachycardia)   . Personal history of meningioma of the brain   . Von Willebrands disease   . Anxiety and depression   . Vitamin B12 deficiency   . GERD (gastroesophageal reflux disease)   . External hemorrhoids   . Tennis elbow    Past Surgical History  Procedure Laterality Date  . Brain meningioma excision  05/2007  . Implantation bone anchored hearing aid    . Cardiac electrophysiology study and ablation  10/22/09    reports that she has never smoked. She has never used smokeless tobacco. She reports that she does not drink alcohol or use illicit drugs. family history includes Diabetes in her maternal uncle; Heart disease in her maternal grandfather and mother; and Stroke in her mother.  There is no history of Colon cancer. Allergies  Allergen Reactions  . Atenolol   . Cephalexin         Review of Systems: Denies any upper GI symptoms nausea vomiting no diarrhea or rectal bleeding  The remainder of the 10 point ROS is negative  except as outlined in H&P   Physical Exam: General appearance  Well developed, in no distress. Eyes- non icteric. HEENT nontraumatic, normocephalic. Mouth no lesions, tongue papillated, no cheilosis. Neck supple without adenopathy, thyroid not enlarged, no carotid bruits, no JVD. Lungs Clear to auscultation bilaterally. Cor normal S1, normal S2, regular rhythm, no murmur,  quiet precordium. Abdomen: Soft nontender abdomen with normoactive bowel sounds. No distention. Liver edge at costal margin. Rectal: External hemorrhoids. Normal rectal sphincter tone. Soft Hemoccult negative stool. Extremities no pedal edema. Skin no lesions. Neurological alert and oriented x 3. Psychological normal mood and affect.  Assessment and Plan:  Problem #73 52 year old white female with left-sided ulcerative colitis. She is in remission at this time and so we will start to taper off some of her medications. We will reduce her Imuran to 50 mg daily for one month and then discontinue it if her condition remains stable.. We will recheck her potassium, blood count and chemistries today. She will continue on Remicade as well as on mesalamine. I will see her in 3 months.   11/22/2012 [name]

## 2012-11-22 NOTE — Patient Instructions (Addendum)
Your physician has requested that you go to the basement for the following lab work before leaving today: CBC, CMET, Sed Rate  You will be due for a recall colonoscopy in 12/2013. We will send you a reminder in the mail when it gets closer to that time.  CC: Dr Earl Lites

## 2012-11-25 ENCOUNTER — Other Ambulatory Visit: Payer: Self-pay | Admitting: *Deleted

## 2012-11-25 DIAGNOSIS — E876 Hypokalemia: Secondary | ICD-10-CM

## 2012-11-25 LAB — TB SKIN TEST
Induration: 0 mm
TB Skin Test: NEGATIVE

## 2012-12-05 ENCOUNTER — Telehealth: Payer: Self-pay | Admitting: *Deleted

## 2012-12-05 NOTE — Telephone Encounter (Signed)
Message copied by Daphine Deutscher on Thu Dec 05, 2012  9:58 AM ------      Message from: Daphine Deutscher      Created: Mon Nov 25, 2012 10:24 AM       Call and remind patient due for K recheck on 12/09/12.DB ------

## 2012-12-05 NOTE — Telephone Encounter (Signed)
Spoke with patient and reminded her of lab recheck next week.

## 2012-12-06 ENCOUNTER — Ambulatory Visit: Payer: BC Managed Care – PPO | Admitting: Internal Medicine

## 2012-12-09 ENCOUNTER — Other Ambulatory Visit (INDEPENDENT_AMBULATORY_CARE_PROVIDER_SITE_OTHER): Payer: BC Managed Care – PPO

## 2012-12-09 DIAGNOSIS — E876 Hypokalemia: Secondary | ICD-10-CM

## 2012-12-09 LAB — POTASSIUM: Potassium: 4 mEq/L (ref 3.5–5.1)

## 2012-12-10 ENCOUNTER — Other Ambulatory Visit: Payer: Self-pay | Admitting: *Deleted

## 2012-12-10 MED ORDER — POTASSIUM CHLORIDE CRYS ER 20 MEQ PO TBCR: 20.0000 meq | EXTENDED_RELEASE_TABLET | Freq: Three times a day (TID) | ORAL | Status: DC

## 2012-12-13 ENCOUNTER — Encounter (HOSPITAL_COMMUNITY): Payer: BC Managed Care – PPO

## 2012-12-20 ENCOUNTER — Encounter (HOSPITAL_COMMUNITY): Payer: Self-pay

## 2012-12-20 ENCOUNTER — Encounter (HOSPITAL_COMMUNITY)
Admission: RE | Admit: 2012-12-20 | Discharge: 2012-12-20 | Disposition: A | Discharge status: Home / Self Care | Payer: BC Managed Care – PPO | Source: Ambulatory Visit | Attending: Internal Medicine | Admitting: Internal Medicine

## 2012-12-20 VITALS — BP 101/59 | HR 56 | Temp 97.3°F | Resp 16 | Ht 59.5 in | Wt 182.1 lb

## 2012-12-20 DIAGNOSIS — K5190 Ulcerative colitis, unspecified, without complications: Secondary | ICD-10-CM

## 2012-12-20 MED ORDER — DIPHENHYDRAMINE HCL 25 MG PO CAPS
25.0000 mg | ORAL_CAPSULE | ORAL | Status: AC
  Administered 2012-12-20: 25 mg via ORAL
  Filled 2012-12-20: qty 1

## 2012-12-20 MED ORDER — ACETAMINOPHEN 325 MG PO TABS
650.0000 mg | ORAL_TABLET | ORAL | Status: AC
  Administered 2012-12-20: 650 mg via ORAL
  Filled 2012-12-20: qty 2

## 2012-12-20 MED ORDER — SODIUM CHLORIDE 0.9 % IV SOLN
INTRAVENOUS | Status: AC
  Administered 2012-12-20: 11:00:00 via INTRAVENOUS

## 2012-12-20 MED ORDER — SODIUM CHLORIDE 0.9 % IV SOLN
5.0000 mg/kg | INTRAVENOUS | Status: AC
  Administered 2012-12-20: 400 mg via INTRAVENOUS
  Filled 2012-12-20: qty 40

## 2013-01-16 ENCOUNTER — Other Ambulatory Visit: Payer: Self-pay | Admitting: Physician Assistant

## 2013-01-16 ENCOUNTER — Other Ambulatory Visit: Payer: Self-pay | Admitting: *Deleted

## 2013-01-16 MED ORDER — POTASSIUM CHLORIDE CRYS ER 20 MEQ PO TBCR: 20.0000 meq | EXTENDED_RELEASE_TABLET | Freq: Three times a day (TID) | ORAL | Status: DC

## 2013-02-10 ENCOUNTER — Encounter: Payer: Self-pay | Admitting: *Deleted

## 2013-02-10 ENCOUNTER — Other Ambulatory Visit (INDEPENDENT_AMBULATORY_CARE_PROVIDER_SITE_OTHER): Payer: BC Managed Care – PPO

## 2013-02-10 ENCOUNTER — Telehealth: Payer: Self-pay | Admitting: Internal Medicine

## 2013-02-10 ENCOUNTER — Other Ambulatory Visit: Payer: Self-pay | Admitting: *Deleted

## 2013-02-10 DIAGNOSIS — R110 Nausea: Secondary | ICD-10-CM

## 2013-02-10 LAB — POTASSIUM: Potassium: 3.8 mEq/L (ref 3.5–5.1)

## 2013-02-10 NOTE — Telephone Encounter (Signed)
I have spoken to the pt. She will come by now to have her K+ checked, she thinks the tid Potassium tablets are making her sick. She will go ahead with Remicade infusion on 6/20.  As far as the nausea is concerned, may be related to Zithromax- she will purchase OTC Pepci and take it prn. I told her I would see her in the office within next 4-6 weeks.

## 2013-02-10 NOTE — Telephone Encounter (Signed)
Lab in [facility]. Scheduled on 03/18/13 at 3:45 PM with Dr. Juanda Chance. Letter mailed.

## 2013-02-10 NOTE — Telephone Encounter (Signed)
Spoke with patient. She is off Prednisone. She had decreased the Azathioprine to QOD but has had to increase back up to daily d/t mucous in stools. She also reports some upper part of stomach pain until she has bowel movement then it goes away.  Has acid taste in mouth until she has bowel movement also. Some nausea and lose of appetite. Denies spasms. Also , reports she had an infection in the screws in her head. Dr. Dorma Russell gave her antibiotic cream and oral antibiotic(Azithromycin 500 mg) . She completed the oral antibiotics on Saturday. She is due for Remicade on 02/14/13 and wants to be sure it is ok to have it. Please, advise.

## 2013-02-11 ENCOUNTER — Telehealth: Payer: Self-pay | Admitting: Internal Medicine

## 2013-02-11 ENCOUNTER — Other Ambulatory Visit: Payer: Self-pay | Admitting: *Deleted

## 2013-02-11 DIAGNOSIS — E876 Hypokalemia: Secondary | ICD-10-CM

## 2013-02-11 MED ORDER — METRONIDAZOLE 250 MG PO TABS: ORAL_TABLET | ORAL | Status: DC

## 2013-02-11 MED ORDER — ALIGN PO CAPS: ORAL_CAPSULE | ORAL | Status: DC

## 2013-02-11 NOTE — Telephone Encounter (Signed)
Per Dr. Juanda Chance, no Remicade on Friday. Flagyl 250 mg BID and probiotic as long as she is on antibiotics for MRSA. Cancelled Remicade at Short stay. Patient aware. Rx sent.

## 2013-02-11 NOTE — Telephone Encounter (Signed)
.  Left a message at patient's work number to call me.

## 2013-02-11 NOTE — Telephone Encounter (Signed)
Patient received a call from Dr. Dorma Russell today. She has MRSA. She is being put on antibiotics. Should she get Remicade on Friday?

## 2013-02-14 ENCOUNTER — Encounter (HOSPITAL_COMMUNITY): Admission: RE | Admit: 2013-02-14 | Payer: BC Managed Care – PPO | Source: Ambulatory Visit

## 2013-02-18 ENCOUNTER — Other Ambulatory Visit: Payer: Self-pay | Admitting: Internal Medicine

## 2013-02-18 ENCOUNTER — Other Ambulatory Visit: Payer: Self-pay | Admitting: Physician Assistant

## 2013-02-20 ENCOUNTER — Telehealth: Payer: Self-pay | Admitting: *Deleted

## 2013-02-20 NOTE — Telephone Encounter (Signed)
Message copied by Daphine Deutscher on Thu Feb 20, 2013  9:22 AM ------      Message from: Daphine Deutscher      Created: Tue Feb 11, 2013 10:41 AM       Call and remind patient due for K level on 02/24/13 for DB ------

## 2013-02-20 NOTE — Telephone Encounter (Signed)
Patient notified and she will have labs drawn. She states Dr. Dorma Russell told her she would be ok for Remicade in another week. Is this ok with you Dr. Juanda Chance?

## 2013-02-20 NOTE — Telephone Encounter (Signed)
Patient notified

## 2013-02-20 NOTE — Telephone Encounter (Signed)
She needs to get back on Remicade as soon as Dr Dorma Russell clears her.

## 2013-02-26 ENCOUNTER — Other Ambulatory Visit (INDEPENDENT_AMBULATORY_CARE_PROVIDER_SITE_OTHER): Payer: BC Managed Care – PPO

## 2013-02-26 DIAGNOSIS — E876 Hypokalemia: Secondary | ICD-10-CM

## 2013-02-26 LAB — POTASSIUM: Potassium: 4.1 mEq/L (ref 3.5–5.1)

## 2013-03-03 ENCOUNTER — Other Ambulatory Visit: Payer: Self-pay | Admitting: *Deleted

## 2013-03-03 DIAGNOSIS — E876 Hypokalemia: Secondary | ICD-10-CM

## 2013-03-14 ENCOUNTER — Encounter (HOSPITAL_COMMUNITY): Payer: BC Managed Care – PPO

## 2013-03-18 ENCOUNTER — Other Ambulatory Visit: Payer: BC Managed Care – PPO

## 2013-03-18 ENCOUNTER — Ambulatory Visit (INDEPENDENT_AMBULATORY_CARE_PROVIDER_SITE_OTHER): Payer: BC Managed Care – PPO | Admitting: Internal Medicine

## 2013-03-18 ENCOUNTER — Encounter: Payer: Self-pay | Admitting: Internal Medicine

## 2013-03-18 VITALS — BP 116/72 | HR 68 | Ht 59.0 in | Wt 183.1 lb

## 2013-03-18 DIAGNOSIS — M869 Osteomyelitis, unspecified: Secondary | ICD-10-CM

## 2013-03-18 DIAGNOSIS — K5190 Ulcerative colitis, unspecified, without complications: Secondary | ICD-10-CM

## 2013-03-18 DIAGNOSIS — D849 Immunodeficiency, unspecified: Secondary | ICD-10-CM

## 2013-03-18 DIAGNOSIS — T8579XA Infection and inflammatory reaction due to other internal prosthetic devices, implants and grafts, initial encounter: Secondary | ICD-10-CM

## 2013-03-18 NOTE — Patient Instructions (Addendum)
Dr Jeronimo Greaves, Dr Ellis Parents

## 2013-03-18 NOTE — Progress Notes (Signed)
Lauren [name] 1960/09/29 MRN [id number]        History of Present Illness:  This is a 52 year old white female with ulcerative colitis currently in symptomatic remission. She had 2 discontinue Remicade 2 months ago because of infection of a implanted hearing aid behind right ear by Dr. Lovey Newcomer several years ago.. She developed MRSA infection and had to be on prolonged antibiotics at which time he had to discontinue the Remicade. Colitis is doing rather well. We were initially planning to taper off her Imuran but  because of discontinuation of the Remicade we have continued the Imuran at 50 mg /day.. She still takes mesalamine 4.8 g daily andt she denies rectal bleeding, abdominal pain or diarrhea. Her stools are other soft not watery. She is on metronidazole 250 mg twice a day to protect her against pseudomembranous colitis. She had  antibiotic associated colitis 3 years ago. She has a history of von Willebrand's disease, hypokalemia, meningioma and supraventricular tachycardia, status post ablation. Last colonoscopy May 2012 showed colitis from 0-30 cm. Biopsy showed chronic active colitis with mild glandular atypia inflammatory polyp was removed. She also had a chronic low-grade colitis between 30 and 40 cm in normal mucosa at about 40 cm.   Past Medical History  Diagnosis Date  . Ulcerative colitis   . SVT (supraventricular tachycardia)   . Personal history of meningioma of the brain   . Von Willebrands disease   . Anxiety and depression   . Vitamin B12 deficiency   . GERD (gastroesophageal reflux disease)   . External hemorrhoids   . Tennis elbow   . MRSA (methicillin resistant Staphylococcus aureus)    Past Surgical History  Procedure Laterality Date  . Brain meningioma excision  05/2007  . Implantation bone anchored hearing aid    . Cardiac electrophysiology study and ablation  10/22/09    reports that she has never smoked. She has never used smokeless tobacco. She reports that  she does not drink alcohol or use illicit drugs. family history includes Diabetes in her maternal uncle; Heart disease in her maternal grandfather and mother; and Stroke in her mother.  There is no history of Colon cancer. Allergies  Allergen Reactions  . Atenolol   . Cephalexin         Review of Systems: Negative for diarrhea or rectal bleeding  The remainder of the 10 point ROS is negative except as outlined in H&P   Physical Exam: General appearance  Well developed, in no distress. Mildly overweight Eyes- non icteric. HEENT nontraumatic, normocephalic. Implanted hearing aid at the right temporal lobe tenderness surrounding the screw Mouth no lesions, tongue papillated, no cheilosis. Neck supple without adenopathy, thyroid not enlarged, no carotid bruits, no JVD. Lungs Clear to auscultation bilaterally. Cor normal S1, normal S2, regular rhythm, no murmur,  quiet precordium. Abdomen: Soft nontender abdomen with normoactive bowel sounds Rectal: Soft Hemoccult negative stool Extremities no pedal edema. Skin no lesions. Neurological alert and oriented x 3. Psychological normal mood and affect.  Assessment and Plan:  Infected hearing aid MRSA, patient on antibiotics. Remicade infusion is on hold. I am waiting for Dr.Krause to clear her so we can restart Remicade. If she cannot be cleared, and has to undergo removal of the hearing aid or if he has to remained on long term antibiotic treatment I suggest that we stop the Remicade and entirely and keep her only on mesalamine and Azathioprim. She has been in symptomatic remission now for several months anyway.. And  this would be a good time to see if she can do  without Remicade. She may actually need colonoscopy to assess the activity of her disease in making that decision.Marland Kitchen She will need hepatitis Aand B status. She declined pneumo vaccine and flu vaccine for personal reasons ( knows person who had a reaction). She will need another bone  density scan. Last one in 2011 showed osteopenia. She has an appointment with Dr.Krause tomorrow and will let us know about the further plans for treatment of her infection  Ulcerative colitis so far greater than 10 years duration. She is immunosuppressed. She's off steroids. She is off Remicade. Last infusion on 12/20/2012. She is now 4 weeks behind her scheduled infusion. We will have to make a decision soon about whether to discontinue remicade  entirely and go back on Imuran or to restart. She may need a colonoscopy to make that decision    03/18/2013 Lauren [name]

## 2013-03-19 LAB — HEPATITIS A ANTIBODY, TOTAL: Hep A Total Ab: NEGATIVE

## 2013-03-19 LAB — HEPATITIS B SURFACE ANTIGEN: Hepatitis B Surface Ag: NEGATIVE

## 2013-03-20 ENCOUNTER — Other Ambulatory Visit: Payer: Self-pay | Admitting: Internal Medicine

## 2013-03-20 DIAGNOSIS — IMO0002 Reserved for concepts with insufficient information to code with codable children: Secondary | ICD-10-CM

## 2013-03-20 LAB — HEPATITIS B SURFACE ANTIBODY,QUALITATIVE: Hep B S Ab: NEGATIVE

## 2013-03-26 ENCOUNTER — Other Ambulatory Visit: Payer: Self-pay | Admitting: Physician Assistant

## 2013-03-28 ENCOUNTER — Inpatient Hospital Stay: Admission: RE | Admit: 2013-03-28 | Payer: BC Managed Care – PPO | Source: Ambulatory Visit

## 2013-03-28 ENCOUNTER — Ambulatory Visit (INDEPENDENT_AMBULATORY_CARE_PROVIDER_SITE_OTHER)
Admission: RE | Admit: 2013-03-28 | Discharge: 2013-03-28 | Disposition: A | Discharge status: Home / Self Care | Payer: BC Managed Care – PPO | Source: Ambulatory Visit | Attending: Internal Medicine | Admitting: Internal Medicine

## 2013-03-28 DIAGNOSIS — IMO0002 Reserved for concepts with insufficient information to code with codable children: Secondary | ICD-10-CM

## 2013-03-31 ENCOUNTER — Telehealth: Payer: Self-pay | Admitting: Emergency Medicine

## 2013-03-31 NOTE — Telephone Encounter (Signed)
Call patient and tell  her to make an appointment at 104 so I can discuss treatment options for her osteoporosis

## 2013-04-01 NOTE — Telephone Encounter (Signed)
Called patient to advise. She was transferred to appt.

## 2013-04-04 ENCOUNTER — Ambulatory Visit: Payer: BC Managed Care – PPO

## 2013-04-04 ENCOUNTER — Ambulatory Visit (INDEPENDENT_AMBULATORY_CARE_PROVIDER_SITE_OTHER): Payer: BC Managed Care – PPO | Admitting: Emergency Medicine

## 2013-04-04 VITALS — BP 118/78 | HR 64 | Temp 97.6°F | Resp 18 | Ht 59.5 in | Wt 182.0 lb

## 2013-04-04 DIAGNOSIS — M25572 Pain in left ankle and joints of left foot: Secondary | ICD-10-CM

## 2013-04-04 DIAGNOSIS — I10 Essential (primary) hypertension: Secondary | ICD-10-CM

## 2013-04-04 DIAGNOSIS — F329 Major depressive disorder, single episode, unspecified: Secondary | ICD-10-CM

## 2013-04-04 DIAGNOSIS — M25579 Pain in unspecified ankle and joints of unspecified foot: Secondary | ICD-10-CM

## 2013-04-04 DIAGNOSIS — M810 Age-related osteoporosis without current pathological fracture: Secondary | ICD-10-CM

## 2013-04-04 DIAGNOSIS — F32A Depression, unspecified: Secondary | ICD-10-CM

## 2013-04-04 DIAGNOSIS — F3289 Other specified depressive episodes: Secondary | ICD-10-CM

## 2013-04-04 MED ORDER — METOPROLOL SUCCINATE ER 50 MG PO TB24: 50.0000 mg | ORAL_TABLET | Freq: Every day | ORAL | Status: DC

## 2013-04-04 MED ORDER — FLUOXETINE HCL 20 MG PO CAPS: 20.0000 mg | ORAL_CAPSULE | Freq: Every day | ORAL | Status: DC

## 2013-04-04 MED ORDER — ERGOCALCIFEROL 1.25 MG (50000 UT) PO CAPS: ORAL_CAPSULE | ORAL | Status: DC

## 2013-04-04 NOTE — Progress Notes (Signed)
  Subjective:    Patient ID: Lauren [name], female    DOB: 05/22/1961, 52 y.o.   MRN: [id number]  HPI patient in followup her depression. This has been stable and she has been on fluoxetine without difficulty. She also had a bone density study done which showed abnormalities and low T-scores. She had T score of the left femur of -2.3 and T score of the right femur of -2.6. She has been on Nexium in the past and steroids in the past and is currently under treatment with a GI specialist. She also is having pain and discomfort in her lateral left foot. She does not know of a specific injury .    Review of Systems     Objective:   Physical Exam there is tenderness and swelling at the base of the fifth metatarsal. There is pain with eversion. Otherwise exam is normal. Chest is clear heart regular rate no murmurs abdomen soft nontender extremities without edema  [facility] reading (PRIMARY) by  Dr. Christene Lye acute fracture          Assessment & Plan:    Patient does not want to take a biphosphonate at the present time. We'll treat with vitamin D 50,000 units once a week. She'll be on calcium 1200 units per day. Repeat studies in one year. Her antihypertensives for refill of her blood pressure is at goal at 118/79. She's going to exercise at least 3 days a week for 40 minutes.

## 2013-04-04 NOTE — Progress Notes (Signed)
  Subjective:    Patient ID: Lauren [name], female    DOB: [birth year], 52 y.o.   MRN: [id number]  HPI    Review of Systems     Objective:   Physical Exam        Assessment & Plan:

## 2013-04-07 ENCOUNTER — Telehealth: Payer: Self-pay | Admitting: Radiology

## 2013-04-07 DIAGNOSIS — I10 Essential (primary) hypertension: Secondary | ICD-10-CM

## 2013-04-07 MED ORDER — METOPROLOL SUCCINATE ER 50 MG PO TB24: 50.0000 mg | ORAL_TABLET | Freq: Every day | ORAL | Status: DC

## 2013-04-07 NOTE — Telephone Encounter (Signed)
Patient was here on 04/04/13 for ankle sprain Her metoprolol was sent in, she is due for OV for her BP She wants refills added to this, please advise

## 2013-04-07 NOTE — Telephone Encounter (Signed)
She can have 2 more refills on her medication that will give her plenty of time to come in to discuss her blood pressure

## 2013-04-23 ENCOUNTER — Other Ambulatory Visit: Payer: Self-pay | Admitting: Internal Medicine

## 2013-04-23 DIAGNOSIS — K5190 Ulcerative colitis, unspecified, without complications: Secondary | ICD-10-CM

## 2013-04-30 ENCOUNTER — Other Ambulatory Visit: Payer: Self-pay | Admitting: Internal Medicine

## 2013-05-29 ENCOUNTER — Telehealth: Payer: Self-pay | Admitting: *Deleted

## 2013-05-29 NOTE — Telephone Encounter (Signed)
Message copied by Daphine Deutscher on Thu May 29, 2013  1:27 PM ------      Message from: Daphine Deutscher      Created: Mon Mar 03, 2013  9:12 AM       Call and remind patient due for K level for DB week of 06/02/13.  ------

## 2013-05-29 NOTE — Telephone Encounter (Signed)
Left a message for patient to call me. 

## 2013-05-30 NOTE — Telephone Encounter (Signed)
Patient advised she is due for labwork week of 06/02/13

## 2013-06-03 ENCOUNTER — Other Ambulatory Visit (INDEPENDENT_AMBULATORY_CARE_PROVIDER_SITE_OTHER): Payer: BC Managed Care – PPO

## 2013-06-03 ENCOUNTER — Other Ambulatory Visit: Payer: Self-pay | Admitting: Internal Medicine

## 2013-06-03 DIAGNOSIS — K5190 Ulcerative colitis, unspecified, without complications: Secondary | ICD-10-CM

## 2013-06-03 DIAGNOSIS — E876 Hypokalemia: Secondary | ICD-10-CM

## 2013-06-03 LAB — CBC WITH DIFFERENTIAL/PLATELET
Basophils Absolute: 0 10*3/uL (ref 0.0–0.1)
Basophils Relative: 0.4 % (ref 0.0–3.0)
Eosinophils Absolute: 0.1 10*3/uL (ref 0.0–0.7)
Eosinophils Relative: 2.5 % (ref 0.0–5.0)
HCT: 40.7 % (ref 36.0–46.0)
Hemoglobin: 13.9 g/dL (ref 12.0–15.0)
Lymphocytes Relative: 26.4 % (ref 12.0–46.0)
Lymphs Abs: 1.4 10*3/uL (ref 0.7–4.0)
MCHC: 34.1 g/dL (ref 30.0–36.0)
MCV: 88.5 fl (ref 78.0–100.0)
Monocytes Absolute: 0.5 10*3/uL (ref 0.1–1.0)
Monocytes Relative: 9 % (ref 3.0–12.0)
Neutro Abs: 3.4 10*3/uL (ref 1.4–7.7)
Neutrophils Relative %: 61.7 % (ref 43.0–77.0)
Platelets: 245 10*3/uL (ref 150.0–400.0)
RBC: 4.6 Mil/uL (ref 3.87–5.11)
RDW: 14.2 % (ref 11.5–14.6)
WBC: 5.4 10*3/uL (ref 4.5–10.5)

## 2013-06-03 LAB — COMPREHENSIVE METABOLIC PANEL
ALT: 20 U/L (ref 0–35)
AST: 21 U/L (ref 0–37)
Albumin: 4.1 g/dL (ref 3.5–5.2)
Alkaline Phosphatase: 62 U/L (ref 39–117)
BUN: 15 mg/dL (ref 6–23)
CO2: 29 mEq/L (ref 19–32)
Calcium: 9.4 mg/dL (ref 8.4–10.5)
Chloride: 103 mEq/L (ref 96–112)
Creatinine, Ser: 0.7 mg/dL (ref 0.4–1.2)
GFR: 96.47 mL/min (ref >60.00)
Glucose, Bld: 89 mg/dL (ref 70–99)
Potassium: 4 mEq/L (ref 3.5–5.1)
Sodium: 139 mEq/L (ref 135–145)
Total Bilirubin: 0.6 mg/dL (ref 0.3–1.2)
Total Protein: 7.6 g/dL (ref 6.0–8.3)

## 2013-06-04 ENCOUNTER — Other Ambulatory Visit: Payer: Self-pay

## 2013-06-04 DIAGNOSIS — Z1231 Encounter for screening mammogram for malignant neoplasm of breast: Secondary | ICD-10-CM

## 2013-06-04 LAB — VITAMIN D 25 HYDROXY (VIT D DEFICIENCY, FRACTURES): Vit D, 25-Hydroxy: 36 ng/mL (ref 30–89)

## 2013-06-10 ENCOUNTER — Other Ambulatory Visit: Payer: Self-pay | Admitting: Internal Medicine

## 2013-07-02 ENCOUNTER — Other Ambulatory Visit: Payer: Self-pay | Admitting: Internal Medicine

## 2013-07-03 ENCOUNTER — Other Ambulatory Visit: Payer: Self-pay

## 2013-07-04 ENCOUNTER — Ambulatory Visit
Admission: RE | Admit: 2013-07-04 | Discharge: 2013-07-04 | Disposition: A | Discharge status: Home / Self Care | Payer: BC Managed Care – PPO | Source: Ambulatory Visit

## 2013-07-04 DIAGNOSIS — Z1231 Encounter for screening mammogram for malignant neoplasm of breast: Secondary | ICD-10-CM

## 2013-07-11 ENCOUNTER — Telehealth: Payer: Self-pay | Admitting: Internal Medicine

## 2013-07-11 ENCOUNTER — Ambulatory Visit (INDEPENDENT_AMBULATORY_CARE_PROVIDER_SITE_OTHER): Payer: BC Managed Care – PPO | Admitting: Internal Medicine

## 2013-07-11 ENCOUNTER — Telehealth: Payer: Self-pay

## 2013-07-11 ENCOUNTER — Encounter: Payer: Self-pay | Admitting: Internal Medicine

## 2013-07-11 ENCOUNTER — Other Ambulatory Visit: Payer: BC Managed Care – PPO

## 2013-07-11 ENCOUNTER — Other Ambulatory Visit (INDEPENDENT_AMBULATORY_CARE_PROVIDER_SITE_OTHER): Payer: BC Managed Care – PPO

## 2013-07-11 VITALS — BP 120/84 | HR 64 | Ht 59.0 in | Wt 190.0 lb

## 2013-07-11 DIAGNOSIS — D849 Immunodeficiency, unspecified: Secondary | ICD-10-CM

## 2013-07-11 DIAGNOSIS — Z79899 Other long term (current) drug therapy: Secondary | ICD-10-CM

## 2013-07-11 DIAGNOSIS — D680 Von Willebrand's disease: Secondary | ICD-10-CM

## 2013-07-11 DIAGNOSIS — K5190 Ulcerative colitis, unspecified, without complications: Secondary | ICD-10-CM

## 2013-07-11 LAB — CBC WITH DIFFERENTIAL/PLATELET
Basophils Absolute: 0 10*3/uL (ref 0.0–0.1)
Basophils Relative: 0.4 % (ref 0.0–3.0)
Eosinophils Absolute: 0.1 10*3/uL (ref 0.0–0.7)
Eosinophils Relative: 2 % (ref 0.0–5.0)
HCT: 43.1 % (ref 36.0–46.0)
Hemoglobin: 14.5 g/dL (ref 12.0–15.0)
Lymphocytes Relative: 23.6 % (ref 12.0–46.0)
Lymphs Abs: 1.5 10*3/uL (ref 0.7–4.0)
MCHC: 33.7 g/dL (ref 30.0–36.0)
MCV: 88.5 fl (ref 78.0–100.0)
Monocytes Absolute: 0.5 10*3/uL (ref 0.1–1.0)
Monocytes Relative: 8.1 % (ref 3.0–12.0)
Neutro Abs: 4.3 10*3/uL (ref 1.4–7.7)
Neutrophils Relative %: 65.9 % (ref 43.0–77.0)
Platelets: 248 10*3/uL (ref 150.0–400.0)
RBC: 4.87 Mil/uL (ref 3.87–5.11)
RDW: 14 % (ref 11.5–14.6)
WBC: 6.5 10*3/uL (ref 4.5–10.5)

## 2013-07-11 LAB — BASIC METABOLIC PANEL
BUN: 13 mg/dL (ref 6–23)
CO2: 31 mEq/L (ref 19–32)
Calcium: 9.5 mg/dL (ref 8.4–10.5)
Chloride: 100 mEq/L (ref 96–112)
Creatinine, Ser: 0.7 mg/dL (ref 0.4–1.2)
GFR: 87.47 mL/min (ref >60.00)
Glucose, Bld: 102 mg/dL — ABNORMAL HIGH (ref 70–99)
Potassium: 3.8 mEq/L (ref 3.5–5.1)
Sodium: 138 mEq/L (ref 135–145)

## 2013-07-11 LAB — URINALYSIS, ROUTINE W REFLEX MICROSCOPIC
Bilirubin Urine: NEGATIVE
Ketones, ur: NEGATIVE
Leukocytes, UA: NEGATIVE
Nitrite: NEGATIVE
Specific Gravity, Urine: 1.025 (ref 1.000–1.030)
Total Protein, Urine: NEGATIVE
Urine Glucose: NEGATIVE
Urobilinogen, UA: 0.2 (ref 0.0–1.0)
pH: 6 (ref 5.0–8.0)

## 2013-07-11 MED ORDER — PREPOPIK 10-3.5-12 MG-GM-GM PO PACK: 1.0000 | PACK | ORAL | Status: DC

## 2013-07-11 NOTE — Telephone Encounter (Signed)
Received calls from Dr Regino Schultze office and pt. We will get her scheduled to see Dr Rosie Fate sometime next week prior to her colonoscopy on Dec 2. This is for management of von Willebrand disease for colonoscopy. Returned calls to Dr Regino Schultze office and pt.

## 2013-07-11 NOTE — Patient Instructions (Addendum)
You have been scheduled for a colonoscopy. Please follow written instructions given to you at your visit today.  Please pick up your prep kit at the pharmacy within the next 1-3 days. If you use inhalers (even only as needed), please bring them with you on the day of your procedure. Your physician has requested that you go to www.startemmi.com and enter the access code given to you at your visit today. This web site gives a general overview about your procedure. However, you should still follow specific instructions given to you by our office regarding your preparation for the procedure.  Your physician has requested that you go to the basement for the following lab work before leaving today: BMET, CBC, U/A  CC: Dr Daub,Dr Maximino Sarin

## 2013-07-11 NOTE — Telephone Encounter (Signed)
Lm for pt and gve np appt 11/19 @ 12:30 w/Dr. Juliann Mule Welcome packet mailed.

## 2013-07-11 NOTE — Progress Notes (Signed)
Lauren [name] 10/25/1960 MRN [id number]   History of Present Illness:  This is a 52-year-old white female with ulcerative colitis of greater than 10 years duration who has been in remission. She was on Remicade until April 2014 when the infusions were put on hold due to an infection of her bone implant hearing aid. She required several courses of antibiotics for control of MRSA. In the meantime, her colitis has remained in remission. She has been able to reduce the  Imuran from 100 mg a day to alternating between 100 mg and 50 mg every other day. She still remain on mesalamine 4.8 g daily. There is a history of pseudomembranous colitis 3 years ago. The last colonoscopy in May 2012 showed colitis from 0-30 cm and then mild colitis from 30-40 cm with a mild glandular atypia and inflammatory polyp. She is due for a recall colonoscopy because she has had the UC for more than 10 years..   Past Medical History  Diagnosis Date  . Ulcerative colitis   . SVT (supraventricular tachycardia)   . Personal history of meningioma of the brain   . Von Willebrands disease   . Anxiety and depression   . Vitamin B12 deficiency   . GERD (gastroesophageal reflux disease)   . External hemorrhoids   . Tennis elbow   . MRSA (methicillin resistant Staphylococcus aureus)    Past Surgical History  Procedure Laterality Date  . Brain meningioma excision  05/2007  . Implantation bone anchored hearing aid    . Cardiac electrophysiology study and ablation  10/22/09    reports that she has never smoked. She has never used smokeless tobacco. She reports that she does not drink alcohol or use illicit drugs. family history includes Diabetes in her maternal uncle; Heart disease in her maternal grandfather and mother; Stroke in her mother. There is no history of Colon cancer. Allergies  Allergen Reactions  . Atenolol   . Cephalexin         Review of Systems: Denies dysphagia heartburn abdominal pain  The remainder of  the 10 point ROS is negative except as outlined in H&P   Physical Exam: General appearance  Well developed, in no distress. Eyes- non icteric. HEENT nontraumatic, normocephalic. Mouth no lesions, tongue papillated, no cheilosis. Neck supple without adenopathy, thyroid not enlarged, no carotid bruits, no JVD. Lungs Clear to auscultation bilaterally. Cor normal S1, normal S2, regular rhythm, no murmur,  quiet precordium. Abdomen: Soft nontender with normal active bowel sounds. No distention. No tympany.  Rectal: Not done. Extremities no pedal edema. Skin no lesions. Neurological alert and oriented x 3. Psychological normal mood and affect.  Assessment and Plan:  Problem #1 52-year-old white female with ulcerative colitis in remission. She has been off  Remicade for 6 months and will continue on hold unless  she flares up again. She has been able to reduce her Imuran to alternating 100 mg with 50 mg. Before we proceed with further reduction in her medical regimen, I will proceed with a colonoscopy to assess the activity and extent of her disease and also to rule out dysplasia. We will continue the mesalamine dose of 4.8 g daily. We will be checking her electrolytes today as well as CBC and sed rate.   07/11/2013 Lauren [name]  

## 2013-07-14 ENCOUNTER — Telehealth: Payer: Self-pay | Admitting: Internal Medicine

## 2013-07-14 ENCOUNTER — Other Ambulatory Visit: Payer: Self-pay | Admitting: *Deleted

## 2013-07-14 DIAGNOSIS — K5120 Ulcerative (chronic) proctitis without complications: Secondary | ICD-10-CM

## 2013-07-14 NOTE — Telephone Encounter (Signed)
C/D 07/14/13 for appt. 07/16/13

## 2013-07-16 ENCOUNTER — Telehealth: Payer: Self-pay | Admitting: *Deleted

## 2013-07-16 ENCOUNTER — Other Ambulatory Visit (INDEPENDENT_AMBULATORY_CARE_PROVIDER_SITE_OTHER): Payer: BC Managed Care – PPO

## 2013-07-16 ENCOUNTER — Ambulatory Visit: Payer: BC Managed Care – PPO

## 2013-07-16 ENCOUNTER — Ambulatory Visit (HOSPITAL_BASED_OUTPATIENT_CLINIC_OR_DEPARTMENT_OTHER): Payer: BC Managed Care – PPO | Admitting: Internal Medicine

## 2013-07-16 ENCOUNTER — Encounter: Payer: Self-pay | Admitting: Internal Medicine

## 2013-07-16 ENCOUNTER — Other Ambulatory Visit: Payer: Self-pay | Admitting: Internal Medicine

## 2013-07-16 VITALS — BP 128/74 | HR 69 | Temp 98.2°F | Resp 20 | Ht 59.0 in | Wt 189.6 lb

## 2013-07-16 DIAGNOSIS — K5190 Ulcerative colitis, unspecified, without complications: Secondary | ICD-10-CM

## 2013-07-16 DIAGNOSIS — D680 Von Willebrand's disease: Secondary | ICD-10-CM

## 2013-07-16 DIAGNOSIS — K5120 Ulcerative (chronic) proctitis without complications: Secondary | ICD-10-CM

## 2013-07-16 LAB — URINALYSIS, ROUTINE W REFLEX MICROSCOPIC
Bilirubin Urine: NEGATIVE
Ketones, ur: NEGATIVE
Nitrite: NEGATIVE
Specific Gravity, Urine: 1.025 (ref 1.000–1.030)
Total Protein, Urine: NEGATIVE
Urine Glucose: NEGATIVE
Urobilinogen, UA: 0.2 (ref 0.0–1.0)
pH: 6 (ref 5.0–8.0)

## 2013-07-16 NOTE — Telephone Encounter (Signed)
Patient brought a letter written by Dr Rosie Fate at the [facility] regarding orders for DDAVP for Von Willenbrands disease which she should have prior to her colonoscopy at [facility] on 07/29/13. I have shown this letter to Dr Juanda Chance (letter can be found under letters dated 07/16/13). She is okay with ordering DDAVP 0.3 mcg/kg in 50 ml normal saline over 20 minutes given 1 hour prior to patient's procedure. Orders have been entered in [facility] and [facility] has been advised of this and has noted this on patient's chart. I have also sent a copy of the letter to [facility] for her review. I have advised patient that she is to arrive at [facility] at 7:00 am rather than 8 am so there will be ample time to administer the DDAVP. I have also advised patient that she is to have bloodwork (BMET) 24 hours after her procedure (07/30/13 @ 9:30 am) at Dr Regino Schultze office. If we obtain biopsies (which she will be advised of), patient will use Stimate into each nostril around 9:30 am 07/30/13 as well. She is advised to restrict free water as well. Patient verbalizes understanding of this information.

## 2013-07-17 ENCOUNTER — Other Ambulatory Visit: Payer: Self-pay | Admitting: *Deleted

## 2013-07-17 DIAGNOSIS — D691 Qualitative platelet defects: Secondary | ICD-10-CM | POA: Insufficient documentation

## 2013-07-17 DIAGNOSIS — N390 Urinary tract infection, site not specified: Secondary | ICD-10-CM

## 2013-07-17 MED ORDER — SULFAMETHOXAZOLE-TRIMETHOPRIM 400-80 MG PO TABS: ORAL_TABLET | ORAL | Status: DC

## 2013-07-17 NOTE — Progress Notes (Signed)
[facility] [phone]   [phone]  NEW PATIENT EVALUATION   Name: Lauren [name] Date: 07/17/2013 MRN: [id number] DOB: 1961-08-27  PCP: Lucilla Edin, MD   REFERRING PHYSICIAN: Collene Gobble, MD  REASON FOR REFERRAL: Von Willebrand's disease    HISTORY OF PRESENT ILLNESS:Lauren [name] is a 52 y.o. female who is here for management of her von Willenbrand's disease.  She is scheduled for a colonoscopy on 07/29/2013.  She was diagnosed with von Willebrand's disease in 2008.  Since that tie, she has undergone brain surgery with resection of a posterior fossa meningioma and endoscopy, flex sigmoidoscopy and in July 2009, she received a bone-anchored hearing aid (BAHA).  She also has history of ulcerative colitis that required changes in her medications.  She notes that review of her literature notes some issues with bleeding and has come to discuss this in the context of her planned procedure.  She was on remicade for her ulcerative colitis in the past.  She denies a recent history of bleeding complications and bruising.     PAST MEDICAL HISTORY:  has a past medical history of Ulcerative colitis; SVT (supraventricular tachycardia); Personal history of meningioma of the brain; Von Willebrands disease; Anxiety and depression; Vitamin B12 deficiency; GERD (gastroesophageal reflux disease); External hemorrhoids; Tennis elbow; and MRSA (methicillin resistant Staphylococcus aureus).     PAST SURGICAL HISTORY: Past Surgical History  Procedure Laterality Date  . Brain meningioma excision  05/2007  . Implantation bone anchored hearing aid    . Cardiac electrophysiology study and ablation  10/22/09     CURRENT MEDICATIONS: has a current medication list which includes the following prescription(s): azathioprine, ergocalciferol, fluoxetine, lialda, metoprolol succinate, multivitamin, potassium chloride sa, prepopik, and  sulfamethoxazole-trimethoprim.   ALLERGIES: Atenolol and Cephalexin   SOCIAL HISTORY:  reports that she has never smoked. She has never used smokeless tobacco. She reports that she does not drink alcohol or use illicit drugs.   FAMILY HISTORY: family history includes Diabetes in her maternal uncle; Heart disease in her maternal grandfather and mother; Stroke in her mother. There is no history of Colon cancer.   LABORATORY DATA:  CBC    Component Value Date/Time   WBC 6.5 07/11/2013 1518   WBC 4.8 06/05/2012 1522   WBC 8.0 01/28/2010 1121   RBC 4.87 07/11/2013 1518   RBC 4.62 06/05/2012 1522   RBC 4.59 01/28/2010 1121   HGB 14.5 07/11/2013 1518   HGB 14.3 06/05/2012 1522   HGB 13.0 01/28/2010 1121   HCT 43.1 07/11/2013 1518   HCT 45.4 06/05/2012 1522   HCT 39.0 01/28/2010 1121   PLT 248.0 07/11/2013 1518   PLT 290 01/28/2010 1121   MCV 88.5 07/11/2013 1518   MCV 98.3* 06/05/2012 1522   MCV 85.1 01/28/2010 1121   MCH 31.0 06/05/2012 1522   MCH 28.3 01/28/2010 1121   MCHC 33.7 07/11/2013 1518   MCHC 31.5* 06/05/2012 1522   MCHC 33.2 01/28/2010 1121   RDW 14.0 07/11/2013 1518   RDW 18.4* 01/28/2010 1121   LYMPHSABS 1.5 07/11/2013 1518   LYMPHSABS 0.8* 01/28/2010 1121   MONOABS 0.5 07/11/2013 1518   MONOABS 0.2 01/28/2010 1121   EOSABS 0.1 07/11/2013 1518   EOSABS 0.0 01/28/2010 1121   BASOSABS 0.0 07/11/2013 1518   BASOSABS 0.0 01/28/2010 1121   CMP     Component Value Date/Time   NA 138 07/11/2013 1518   K 3.8 07/11/2013 1518   CL 100  07/11/2013 1518   CO2 31 07/11/2013 1518   GLUCOSE 102* 07/11/2013 1518   BUN 13 07/11/2013 1518   CREATININE 0.7 07/11/2013 1518   CREATININE 0.61 06/05/2012 1517   CALCIUM 9.5 07/11/2013 1518   PROT 7.6 06/03/2013 1649   ALBUMIN 4.1 06/03/2013 1649   AST 21 06/03/2013 1649   ALT 20 06/03/2013 1649   ALKPHOS 62 06/03/2013 1649   BILITOT 0.6 06/03/2013 1649   GFRNONAA 101.02 07/04/2010 1718   GFRAA  Value: >60        The eGFR has been calculated using the MDRD  equation. This calculation has not been validated in all clinical situations. eGFR's persistently <60 mL/min signify possible Chronic Kidney Disease. 07/09/2009 1640    RADIOGRAPHY: Mm Digital Screening  07/04/2013   CLINICAL DATA:  Screening.  EXAM: DIGITAL SCREENING BILATERAL MAMMOGRAM WITH CAD  DIGITAL BREAST TOMOSYNTHESIS  Digital breast tomosynthesis images are acquired in two projections. These images are reviewed in combination with the digital mammogram, confirming the findings below.  COMPARISON:  Previous exam(s).  ACR Breast Density Category b: There are scattered areas of fibroglandular density.  FINDINGS: There are no findings suspicious for malignancy. Images were processed with CAD.  IMPRESSION: No mammographic evidence of malignancy. A result letter of this screening mammogram will be mailed directly to the patient.  RECOMMENDATION: Screening mammogram in one year. (Code:SM-B-01Y)  BI-RADS CATEGORY  1: Negative   Electronically Signed   By: Britta Mccreedy M.D.   On: 07/04/2013 15:13       REVIEW OF SYSTEMS:  Constitutional: Denies fevers, chills or abnormal weight loss Eyes: Denies blurriness of vision Ears, nose, mouth, throat, and face: Denies mucositis or sore throat Respiratory: Denies cough, dyspnea or wheezes Cardiovascular: Denies palpitation, chest discomfort or lower extremity swelling Gastrointestinal:  Denies nausea, heartburn or change in bowel habits Skin: Denies abnormal skin rashes Lymphatics: Denies new lymphadenopathy or easy bruising Neurological:Denies numbness, tingling or new weaknesses Behavioral/Psych: Mood is stable, no new changes  All other systems were reviewed with the patient and are negative.  PHYSICAL EXAM:  height is 4\' 11"  (1.499 m) and weight is 189 lb 9.6 oz (86.002 kg). Her oral temperature is 98.2 F (36.8 C). Her blood pressure is 128/74 and her pulse is 69. Her respiration is 20.    GENERAL:alert, no distress and comfortable SKIN: skin  color, texture, turgor are normal, no rashes or significant lesions EYES: normal, Conjunctiva are pink and non-injected, sclera clear OROPHARYNX:no exudate, no erythema and lips, buccal mucosa, and tongue normal  NECK: supple, thyroid normal size, non-tender, without nodularity LYMPH:  no palpable lymphadenopathy in the cervical, axillary or inguinal LUNGS: clear to auscultation and percussion with normal breathing effort HEART: regular rate & rhythm and no murmurs and no lower extremity edema ABDOMEN:abdomen soft, non-tender and normal bowel sounds Musculoskeletal:no cyanosis of digits and no clubbing  NEURO: alert & oriented x 3 with fluent speech, no focal motor/sensory deficits   IMPRESSION: Lauren [name] is a 52 y.o. female with a history of VWD and ulcerative colitis awaits a GI procedure on 07/29/2013. Marland Kitchen    PLAN: 1.  VWD. --Because an invasive procedure may probably be performed on 12/02 with colonoscopy and possible polypectomy, we recommended that she receive DDAVP an hour before the procedure.  DDAVP should be given at 0.3 mcg/kg and infused over 20 minutes.  If any excessive bleeding occurred during or following the procedure, we reccommended plasma-derived factor containing von Willebrand's. We also cautioned  that she restricts free water and closely monitor hyponatremia with collecting a BMP 24 hours post-procedure.  She was instructed to spray stimate to both nostrils 24 hours post procedure if biopsies were obtained.    2.  UC, in remission.  --She was on remicade until April 2014 when the infusions were put on hold due to an infection of her bone implant hearing aid.  This required several courses of antibiotics for control of MRSA.  She remains in remission.  She is now on imuran 100 mg and 50 mg every other day.  She remains on mesalamine 4.8 gram daily.  She has a history of pseudomembranous colitis 3 years ago.  She is being followed by Dr. Juanda Chance.   3. Follow-up.   --Patient instructed to follow-up on an as needed basis. All questions were answered. The patient knows to call the clinic with any problems, questions or concerns. We can certainly see the patient much sooner if necessary.  I spent 40 minutes counseling the patient face to face. The total time spent in the appointment was 60 minutes.    Makalah Asberry, MD 07/17/2013 2:16 PM

## 2013-07-18 ENCOUNTER — Telehealth: Payer: Self-pay | Admitting: Medical Oncology

## 2013-07-18 NOTE — Telephone Encounter (Signed)
Pt called and states that she has a bottle of stimate.that she will need to use post colonoscopy. When she looked at the date of expiration is 03/2013. She is not sure if she needs a new prescription or will it be ok to use. I spoke with Lew-pharmacist and he states is will be ok. Pt notified. She voiced understanding.

## 2013-07-28 ENCOUNTER — Telehealth: Payer: Self-pay

## 2013-07-28 NOTE — Telephone Encounter (Signed)
Pt called about timing of labs after ddavp and stimate, clarified with Dr Rosie Fate and called pt back. If she uses stimate following biopsy the lab should be drawn 24 hours after stimate. Otherwise labs should be drawn 24 hr after ddavp. Dr Juanda Chance would be ordering lab draws. Pt thanked [place] for the information.

## 2013-07-29 ENCOUNTER — Other Ambulatory Visit: Payer: Self-pay | Admitting: *Deleted

## 2013-07-29 ENCOUNTER — Ambulatory Visit (HOSPITAL_COMMUNITY)
Admission: RE | Admit: 2013-07-29 | Discharge: 2013-07-29 | Disposition: A | Discharge status: Home / Self Care | Payer: BC Managed Care – PPO | Source: Ambulatory Visit | Attending: Internal Medicine | Admitting: Internal Medicine

## 2013-07-29 ENCOUNTER — Encounter (HOSPITAL_COMMUNITY)
Admission: RE | Disposition: A | Discharge status: Home / Self Care | Payer: Self-pay | Source: Ambulatory Visit | Attending: Internal Medicine

## 2013-07-29 ENCOUNTER — Encounter (HOSPITAL_COMMUNITY): Payer: Self-pay | Admitting: *Deleted

## 2013-07-29 DIAGNOSIS — D6800 Von Willebrand disease, unspecified: Secondary | ICD-10-CM | POA: Insufficient documentation

## 2013-07-29 DIAGNOSIS — K219 Gastro-esophageal reflux disease without esophagitis: Secondary | ICD-10-CM | POA: Insufficient documentation

## 2013-07-29 DIAGNOSIS — D126 Benign neoplasm of colon, unspecified: Secondary | ICD-10-CM

## 2013-07-29 DIAGNOSIS — K648 Other hemorrhoids: Secondary | ICD-10-CM | POA: Insufficient documentation

## 2013-07-29 DIAGNOSIS — Z79899 Other long term (current) drug therapy: Secondary | ICD-10-CM

## 2013-07-29 DIAGNOSIS — K5190 Ulcerative colitis, unspecified, without complications: Secondary | ICD-10-CM

## 2013-07-29 DIAGNOSIS — D849 Immunodeficiency, unspecified: Secondary | ICD-10-CM

## 2013-07-29 HISTORY — PX: COLONOSCOPY: SHX5424

## 2013-07-29 SURGERY — COLONOSCOPY
Anesthesia: Moderate Sedation

## 2013-07-29 MED ORDER — DIPHENHYDRAMINE HCL 50 MG/ML IJ SOLN
INTRAMUSCULAR | Status: AC
  Filled 2013-07-29: qty 1

## 2013-07-29 MED ORDER — SODIUM CHLORIDE 0.9 % IV SOLN
26.0000 ug | Freq: Once | INTRAVENOUS | Status: DC
  Filled 2013-07-29: qty 6.5

## 2013-07-29 MED ORDER — SODIUM CHLORIDE 0.9 % IV SOLN: INTRAVENOUS | Status: DC

## 2013-07-29 MED ORDER — MIDAZOLAM HCL 10 MG/2ML IJ SOLN
INTRAMUSCULAR | Status: AC
  Filled 2013-07-29: qty 2

## 2013-07-29 MED ORDER — FENTANYL CITRATE 0.05 MG/ML IJ SOLN
INTRAMUSCULAR | Status: AC
  Filled 2013-07-29: qty 2

## 2013-07-29 MED ORDER — FENTANYL CITRATE 0.05 MG/ML IJ SOLN
INTRAMUSCULAR | Status: DC | PRN
  Administered 2013-07-29 (×3): 25 ug via INTRAVENOUS

## 2013-07-29 MED ORDER — MIDAZOLAM HCL 5 MG/5ML IJ SOLN
INTRAMUSCULAR | Status: DC | PRN
  Administered 2013-07-29 (×3): 2 mg via INTRAVENOUS

## 2013-07-29 NOTE — Op Note (Signed)
[facility] [address] [place], [address]   COLONOSCOPY PROCEDURE REPORT  PATIENT: Lauren [name], Lauren [name]  MR#: [id number] BIRTHDATE: 1961/08/03 , 52  yrs. old GENDER: Female ENDOSCOPIST: Hart Carwin, MD REFERRED IO:NGEXBM Cleta Alberts, M.D. PROCEDURE DATE:  07/29/2013 PROCEDURE:   Colonoscopy with snare polypectomy and Colonoscopy with biopsy First Screening Colonoscopy - Avg.  risk and is 50 yrs.  old or older - No.  Prior Negative Screening - Now for repeat screening. N/A  History of Adenoma - Now for follow-up colonoscopy & has been > or = to 3 yrs.  N/A  Polyps Removed Today? Yes. ASA CLASS:   Class II INDICATIONS:history of ulcerative colitis of greater than 10 year duration.  Last colonoscopy May 2012 showed active colitis involving left colon.  She has von Willebrand's disease, received I unit od DDAVP prior to the procedure. MEDICATIONS: These medications were titrated to patient response per physician's verbal order, Fentanyl 75 mcg IV, and Versed 6 mg IV  DESCRIPTION OF PROCEDURE:   After the risks benefits and alternatives of the procedure were thoroughly explained, informed consent was obtained.  A digital rectal exam revealed no abnormalities of the rectum.   The Pentax Ped Colon [id number] endoscope was introduced through the anus and advanced to the cecum, which was identified by both the appendix and ileocecal valve. No adverse events experienced.   The quality of the prep was Prepopik excellent  The instrument was then slowly withdrawn as the colon was fully examined.      COLON FINDINGS: There were small internal hemorrhoids.  Mucosa from 0-20 cm was acutely inflamed, granular, contact bleeding and edema. Biopsies were taken.  At the level of 20 cm from the rectum was a 15 mm sessile soft appearing polyp which was friable and was snared with hot snare and  recovered.  From 30  to 50 cm mucosa was granular and erythematous but there was no bleeding  or friability. Biopsies were taken to rule out dysplasia.  Splenic flexure ,transverse colon, hepatic flexure and a descending colon were entirely normal.  Multiple biopsies were taken from the right and transverse colon.  Ileocecal valve and cecal pouch were unremarkable.  Retroflexed views revealed no abnormalities. The time to cecum=  .  Withdrawal time=  .  The scope was withdrawn and the procedure completed. COMPLICATIONS: There were no complications.  ENDOSCOPIC IMPRESSION: There were small internal hemorrhoids.  Mucosa from 0-20 cm was acutely inflamed, granular, contact bleeding and edema.  Biopsies were taken.  At the level of 20 cm from the rectum was a 15 mm sessile soft appearing polyp which was friable and was snared with cold snare and fully recovered.  Decreased ability to 50 cm mucosa was granular and erythematous but there was no bleeding or friability.  Biopsies were taken to rule out dysplasia.  Splenic flexure transverse colon hepatic flexure and a descending colon were entirely normal.  Multiple biopsies were taken from the right and transverse colon.  Ileocecal valve and cecal pouch were unremarkable Final impression: mild to moderately active ulcerative colitis involving sigmoid and descending colon from 0-50 cm 15 mm sessile polyp at 20 cm removed with cold snare Normal appearing a stent being and transverse colon Status post lower multiple random biopsies of the right, left and sigmoid colon  RECOMMENDATIONS: 1.  Await biopsy results 2.  Continue current medications 3. Use Stimate tomorrow at 11.00 am. 4. Have CBC checked on 07/31/2013 in our office 5. recall  colonoscopy 3 years eSigned:  Hart Carwin, MD 07/29/2013 10:27 AM   cc:   PATIENT NAME:  Lauren [name], Lauren [name] MR#: [id number]

## 2013-07-29 NOTE — H&P (View-Only) (Signed)
Lauren [name] 1961-07-29 MRN [id number]   History of Present Illness:  This is a 52 year old white female with ulcerative colitis of greater than 10 years duration who has been in remission. She was on Remicade until April 2014 when the infusions were put on hold due to an infection of her bone implant hearing aid. She required several courses of antibiotics for control of MRSA. In the meantime, her colitis has remained in remission. She has been able to reduce the  Imuran from 100 mg a day to alternating between 100 mg and 50 mg every other day. She still remain on mesalamine 4.8 g daily. There is a history of pseudomembranous colitis 3 years ago. The last colonoscopy in May 2012 showed colitis from 0-30 cm and then mild colitis from 30-40 cm with a mild glandular atypia and inflammatory polyp. She is due for a recall colonoscopy because she has had the UC for more than 10 years..   Past Medical History  Diagnosis Date  . Ulcerative colitis   . SVT (supraventricular tachycardia)   . Personal history of meningioma of the brain   . Von Willebrands disease   . Anxiety and depression   . Vitamin B12 deficiency   . GERD (gastroesophageal reflux disease)   . External hemorrhoids   . Tennis elbow   . MRSA (methicillin resistant Staphylococcus aureus)    Past Surgical History  Procedure Laterality Date  . Brain meningioma excision  05/2007  . Implantation bone anchored hearing aid    . Cardiac electrophysiology study and ablation  10/22/09    reports that she has never smoked. She has never used smokeless tobacco. She reports that she does not drink alcohol or use illicit drugs. family history includes Diabetes in her maternal uncle; Heart disease in her maternal grandfather and mother; Stroke in her mother. There is no history of Colon cancer. Allergies  Allergen Reactions  . Atenolol   . Cephalexin         Review of Systems: Denies dysphagia heartburn abdominal pain  The remainder of  the 10 point ROS is negative except as outlined in H&P   Physical Exam: General appearance  Well developed, in no distress. Eyes- non icteric. HEENT nontraumatic, normocephalic. Mouth no lesions, tongue papillated, no cheilosis. Neck supple without adenopathy, thyroid not enlarged, no carotid bruits, no JVD. Lungs Clear to auscultation bilaterally. Cor normal S1, normal S2, regular rhythm, no murmur,  quiet precordium. Abdomen: Soft nontender with normal active bowel sounds. No distention. No tympany.  Rectal: Not done. Extremities no pedal edema. Skin no lesions. Neurological alert and oriented x 3. Psychological normal mood and affect.  Assessment and Plan:  Problem #32 52 year old white female with ulcerative colitis in remission. She has been off  Remicade for 6 months and will continue on hold unless  she flares up again. She has been able to reduce her Imuran to alternating 100 mg with 50 mg. Before we proceed with further reduction in her medical regimen, I will proceed with a colonoscopy to assess the activity and extent of her disease and also to rule out dysplasia. We will continue the mesalamine dose of 4.8 g daily. We will be checking her electrolytes today as well as CBC and sed rate.   07/11/2013 Delfin Edis

## 2013-07-29 NOTE — Interval H&P Note (Signed)
History and Physical Interval Note:  07/29/2013 9:09 AM  Lauren [name]  has presented today for surgery, with the diagnosis of ulcerative colitis  The various methods of treatment have been discussed with the patient and family. After consideration of risks, benefits and other options for treatment, the patient has consented to  Procedure(s): COLONOSCOPY (N/A) as a surgical intervention .  The patient's history has been reviewed, patient examined, no change in status, stable for surgery.  I have reviewed the patient's chart and labs.  Questions were answered to the patient's satisfaction.     [name]

## 2013-07-30 ENCOUNTER — Encounter: Payer: Self-pay | Admitting: Internal Medicine

## 2013-07-30 ENCOUNTER — Encounter (HOSPITAL_COMMUNITY): Payer: Self-pay | Admitting: Internal Medicine

## 2013-07-31 ENCOUNTER — Other Ambulatory Visit: Payer: Self-pay | Admitting: *Deleted

## 2013-07-31 ENCOUNTER — Other Ambulatory Visit (INDEPENDENT_AMBULATORY_CARE_PROVIDER_SITE_OTHER): Payer: BC Managed Care – PPO

## 2013-07-31 DIAGNOSIS — D6800 Von Willebrand disease, unspecified: Secondary | ICD-10-CM

## 2013-07-31 DIAGNOSIS — N390 Urinary tract infection, site not specified: Secondary | ICD-10-CM

## 2013-07-31 DIAGNOSIS — K5190 Ulcerative colitis, unspecified, without complications: Secondary | ICD-10-CM

## 2013-07-31 LAB — URINALYSIS, ROUTINE W REFLEX MICROSCOPIC
Bilirubin Urine: NEGATIVE
Ketones, ur: NEGATIVE
Leukocytes, UA: NEGATIVE
Nitrite: NEGATIVE
Specific Gravity, Urine: 1.015 (ref 1.000–1.030)
Total Protein, Urine: NEGATIVE
Urine Glucose: NEGATIVE
Urobilinogen, UA: 0.2 (ref 0.0–1.0)
WBC, UA: NONE SEEN (ref 0–?)
pH: 6 (ref 5.0–8.0)

## 2013-07-31 LAB — CBC WITH DIFFERENTIAL/PLATELET
Basophils Absolute: 0 10*3/uL (ref 0.0–0.1)
Basophils Relative: 0.4 % (ref 0.0–3.0)
Eosinophils Absolute: 0.1 10*3/uL (ref 0.0–0.7)
Eosinophils Relative: 2 % (ref 0.0–5.0)
HCT: 42 % (ref 36.0–46.0)
Hemoglobin: 14.1 g/dL (ref 12.0–15.0)
Lymphocytes Relative: 23.4 % (ref 12.0–46.0)
Lymphs Abs: 1.4 10*3/uL (ref 0.7–4.0)
MCHC: 33.7 g/dL (ref 30.0–36.0)
MCV: 87.8 fl (ref 78.0–100.0)
Monocytes Absolute: 0.4 10*3/uL (ref 0.1–1.0)
Monocytes Relative: 7.2 % (ref 3.0–12.0)
Neutro Abs: 3.9 10*3/uL (ref 1.4–7.7)
Neutrophils Relative %: 67 % (ref 43.0–77.0)
Platelets: 249 10*3/uL (ref 150.0–400.0)
RBC: 4.78 Mil/uL (ref 3.87–5.11)
RDW: 13.4 % (ref 11.5–14.6)
WBC: 5.8 10*3/uL (ref 4.5–10.5)

## 2013-07-31 LAB — BASIC METABOLIC PANEL
BUN: 10 mg/dL (ref 6–23)
CO2: 29 mEq/L (ref 19–32)
Calcium: 9.2 mg/dL (ref 8.4–10.5)
Chloride: 98 mEq/L (ref 96–112)
Creatinine, Ser: 0.7 mg/dL (ref 0.4–1.2)
GFR: 99.79 mL/min (ref >60.00)
Glucose, Bld: 113 mg/dL — ABNORMAL HIGH (ref 70–99)
Potassium: 4.5 mEq/L (ref 3.5–5.1)
Sodium: 133 mEq/L — ABNORMAL LOW (ref 135–145)

## 2013-08-04 ENCOUNTER — Telehealth: Payer: Self-pay | Admitting: Internal Medicine

## 2013-08-04 NOTE — Telephone Encounter (Signed)
Patient has left for the day. Will call tomorrow.

## 2013-08-05 NOTE — Telephone Encounter (Signed)
We will wait till next appointment to see how she does without Remicade.

## 2013-08-05 NOTE — Telephone Encounter (Signed)
Spoke with patient and she is asking if she needs to restart the Remicade. She is taking the Imuran 50 mg daily now. Please, advise.

## 2013-08-05 NOTE — Telephone Encounter (Signed)
Patient given recommendation. 

## 2013-08-10 ENCOUNTER — Other Ambulatory Visit: Payer: Self-pay | Admitting: Emergency Medicine

## 2013-08-10 ENCOUNTER — Other Ambulatory Visit: Payer: Self-pay | Admitting: Internal Medicine

## 2013-08-12 ENCOUNTER — Telehealth: Payer: Self-pay | Admitting: *Deleted

## 2013-08-12 NOTE — Telephone Encounter (Signed)
Spoke with patient and she will get labs.

## 2013-08-12 NOTE — Telephone Encounter (Signed)
Message copied by Daphine Deutscher on Tue Aug 12, 2013  9:22 AM ------      Message from: Daphine Deutscher      Created: Thu Jul 31, 2013 11:38 AM       Call and remind bmet due on 08/14/13 Db. Lab in EPIC ------

## 2013-08-14 ENCOUNTER — Other Ambulatory Visit (INDEPENDENT_AMBULATORY_CARE_PROVIDER_SITE_OTHER): Payer: BC Managed Care – PPO

## 2013-08-14 DIAGNOSIS — K5190 Ulcerative colitis, unspecified, without complications: Secondary | ICD-10-CM

## 2013-08-14 LAB — BASIC METABOLIC PANEL
BUN: 13 mg/dL (ref 6–23)
CO2: 31 mEq/L (ref 19–32)
Calcium: 9 mg/dL (ref 8.4–10.5)
Chloride: 101 mEq/L (ref 96–112)
Creatinine, Ser: 0.7 mg/dL (ref 0.4–1.2)
GFR: 96.4 mL/min (ref >60.00)
Glucose, Bld: 91 mg/dL (ref 70–99)
Potassium: 4.8 mEq/L (ref 3.5–5.1)
Sodium: 137 mEq/L (ref 135–145)

## 2013-09-12 ENCOUNTER — Ambulatory Visit (INDEPENDENT_AMBULATORY_CARE_PROVIDER_SITE_OTHER): Payer: BC Managed Care – PPO | Admitting: Obstetrics and Gynecology

## 2013-09-12 ENCOUNTER — Encounter: Payer: Self-pay | Admitting: Obstetrics and Gynecology

## 2013-09-12 ENCOUNTER — Ambulatory Visit: Payer: BC Managed Care – PPO | Admitting: Obstetrics and Gynecology

## 2013-09-12 VITALS — BP 128/80 | HR 72 | Resp 16 | Ht 59.0 in | Wt 195.0 lb

## 2013-09-12 DIAGNOSIS — M899 Disorder of bone, unspecified: Secondary | ICD-10-CM

## 2013-09-12 DIAGNOSIS — Z01419 Encounter for gynecological examination (general) (routine) without abnormal findings: Secondary | ICD-10-CM

## 2013-09-12 DIAGNOSIS — M949 Disorder of cartilage, unspecified: Secondary | ICD-10-CM

## 2013-09-12 DIAGNOSIS — M8580 Other specified disorders of bone density and structure, unspecified site: Secondary | ICD-10-CM

## 2013-09-12 MED ORDER — ERGOCALCIFEROL 1.25 MG (50000 UT) PO CAPS: ORAL_CAPSULE | ORAL | Status: DC

## 2013-09-12 MED ORDER — VITAMIN D (ERGOCALCIFEROL) 1.25 MG (50000 UNIT) PO CAPS: 50000.0000 [IU] | ORAL_CAPSULE | ORAL | Status: DC

## 2013-09-12 NOTE — Patient Instructions (Signed)

## 2013-09-12 NOTE — Progress Notes (Signed)
GYNECOLOGY VISIT  PCP: Rae Lips, MD  Referring provider:   HPI: 53 y.o.   Married  Caucasian  female   G1P1 with Patient's last menstrual period was 11/26/2008.   here for   Annual Exam Patient is on Vit D once a month by Dr. Everlene Farrier.  Vit D 36 3 months ago. After bone density was done by Dr. Olevia Perches, prescription is now once a week due to diagnosis of osteopenia.  Wants a prescription for 6 months at a time.   Hgb:  PCP Urine:  PCP  GYNECOLOGIC HISTORY: Patient's last menstrual period was 11/26/2008. Sexually active:  no Partner preference: female Contraception:  menopause  Menopausal hormone therapy: no DES exposure:   no Blood transfusions:   no Sexually transmitted diseases:   no GYN Procedures:  no Mammogram:  06/2013 normal               Pap:   09/06/12 neg, negative HR HPV.  History of abnormal pap smear:  no   OB History   Grav Para Term Preterm Abortions TAB SAB Ect Mult Living   1 1        1        LIFESTYLE: Exercise:       Treadmill, weights 2-3 days a week        Tobacco: no Alcohol: no Drug use:  no  OTHER HEALTH MAINTENANCE: Tetanus/TDap: 2006 Gardisil: no Influenza: no  Zostavax:no  Bone density: 2014 Osteopenia T score -1.0 Colonoscopy:2014  Polyps repeat in 5 years  Cholesterol check: 2014 slightly elevated  Family History  Problem Relation Age of Onset  . Heart disease Maternal Grandfather   . Heart disease Mother   . Stroke Mother   . Diabetes Maternal Uncle   . Colon cancer Neg Hx     Patient Active Problem List   Diagnosis Date Noted  . Benign neoplasm of colon 07/29/2013  . Von Willebrand disease 07/17/2013  . Osteoporosis, unspecified 04/04/2013  . VITAMIN B12 DEFICIENCY 04/05/2010  . SINOATRIAL NODE DYSFUNCTION 10/15/2009  . DEPRESSION, HX OF 09/17/2009  . HEMORRHOIDS-EXTERNAL 05/18/2009  . ANXIETY 02/26/2008  . HYPERTENSION 02/26/2008  . GERD 02/26/2008  . ULCERATIVE COLITIS 02/26/2008  . HOARSENESS 02/26/2008  .  COUGH, CHRONIC 02/26/2008   Past Medical History  Diagnosis Date  . Ulcerative colitis   . SVT (supraventricular tachycardia)   . Personal history of meningioma of the brain   . Von Willebrands disease   . Anxiety and depression   . Vitamin B12 deficiency   . GERD (gastroesophageal reflux disease)   . Tennis elbow   . MRSA (methicillin resistant Staphylococcus aureus)   . Hypertension   . Osteopenia   . Clotting disorder   . Anemia     Past Surgical History  Procedure Laterality Date  . Brain meningioma excision  05/2007  . Implantation bone anchored hearing aid    . Cardiac electrophysiology study and ablation  10/22/09  . Colonoscopy N/A 07/29/2013    Procedure: COLONOSCOPY;  Surgeon: Lafayette Dragon, MD;  Location: [facility];  Service: Endoscopy;  Laterality: N/A;  . Wisdom tooth extraction      ALLERGIES: Atenolol and Cephalexin  Current Outpatient Prescriptions  Medication Sig Dispense Refill  . azaTHIOprine (IMURAN) 50 MG tablet Take 50 mg by mouth daily.      . ergocalciferol (VITAMIN D2) 50000 UNITS capsule Take one capsule by mouth every week  4 capsule  11  . FLUoxetine (PROZAC) 20 MG capsule  Take 1 capsule (20 mg total) by mouth daily. PATIENT NEEDS OFFICE VISIT FOR ADDITIONAL REFILLS - 2nd NOTICE  30 capsule  11  . LIALDA 1.2 G EC tablet TAKE 4 TABLETS BY MOUTH EVERY DAY  120 tablet  0  . metoprolol succinate (TOPROL-XL) 50 MG 24 hr tablet TAKE 1 TABLET BY MOUTH EVERY DAY  30 tablet  1  . Multiple Vitamin (MULTIVITAMIN) capsule Take 2 capsules by mouth daily.       . potassium chloride SA (K-DUR,KLOR-CON) 20 MEQ tablet Take 20 mEq by mouth 2 (two) times daily.       No current facility-administered medications for this visit.     ROS:  Pertinent items are noted in HPI.  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   PHYSICAL EXAMINATION:    BP 128/80  Pulse 72  Resp 16  Ht 4' 11"  (1.499 m)  Wt 195 lb (88.451 kg)  BMI 39.36 kg/m2  LMP 11/26/2008   Wt  Readings from Last 3 Encounters:  09/12/13 195 lb (88.451 kg)  07/16/13 189 lb 9.6 oz (86.002 kg)  07/11/13 190 lb (86.183 kg)     Ht Readings from Last 3 Encounters:  09/12/13 4' 11"  (1.499 m)  07/16/13 4' 11"  (1.499 m)  07/11/13 4' 11"  (1.499 m)    General appearance: alert, cooperative and appears stated age Head: Normocephalic, without obvious abnormality, atraumatic Neck: no adenopathy, supple, symmetrical, trachea midline and thyroid not enlarged, symmetric, no tenderness/mass/nodules Lungs: clear to auscultation bilaterally Breasts: Inspection negative, No nipple retraction or dimpling, No nipple discharge or bleeding, No axillary or supraclavicular adenopathy, Normal to palpation without dominant masses Heart: regular rate and rhythm Abdomen: soft, non-tender; no masses,  no organomegaly Extremities: extremities normal, atraumatic, no cyanosis or edema Skin: Skin color, texture, turgor normal. No rashes or lesions Lymph nodes: Cervical, supraclavicular, and axillary nodes normal. No abnormal inguinal nodes palpated Neurologic: Grossly normal  Pelvic: External genitalia:  no lesions              Urethra:  normal appearing urethra with no masses, tenderness or lesions              Bartholins and Skenes: normal                 Vagina: normal appearing vagina with normal color and discharge, no lesions              Cervix: normal appearance              Pap and high risk HPV testing done: no.            Bimanual Exam:  Uterus:  uterus is normal size, shape, consistency and nontender                                      Adnexa: normal adnexa in size, nontender and no masses                                      Rectovaginal: Confirms                                      Anus:  normal sphincter tone, no lesions  ASSESSMENT  Normal  gynecologic exam. Osteopenia.  Vit d deficiency.  PLAN  Mammogram yearly Pap smear and high risk HPV testing not indicated Check Vit D  level. Rx for patient to take Vit D 50,000 IU weekly.  #24 RF one. Medications per Epic orders Return annually or prn   An After Visit Summary was printed and given to the patient.

## 2013-09-13 LAB — VITAMIN D 25 HYDROXY (VIT D DEFICIENCY, FRACTURES): Vit D, 25-Hydroxy: 36 ng/mL (ref 30–89)

## 2013-10-01 ENCOUNTER — Other Ambulatory Visit: Payer: Self-pay | Admitting: Internal Medicine

## 2013-10-03 ENCOUNTER — Encounter: Payer: Self-pay | Admitting: *Deleted

## 2013-10-12 ENCOUNTER — Other Ambulatory Visit: Payer: Self-pay | Admitting: Emergency Medicine

## 2013-10-27 ENCOUNTER — Ambulatory Visit (INDEPENDENT_AMBULATORY_CARE_PROVIDER_SITE_OTHER): Payer: BC Managed Care – PPO | Admitting: Internal Medicine

## 2013-10-27 VITALS — BP 136/84 | HR 59 | Temp 98.3°F | Resp 14 | Ht 59.5 in | Wt 193.0 lb

## 2013-10-27 DIAGNOSIS — F32A Depression, unspecified: Secondary | ICD-10-CM

## 2013-10-27 DIAGNOSIS — I10 Essential (primary) hypertension: Secondary | ICD-10-CM

## 2013-10-27 DIAGNOSIS — Z0000 Encounter for general adult medical examination without abnormal findings: Secondary | ICD-10-CM

## 2013-10-27 DIAGNOSIS — I495 Sick sinus syndrome: Secondary | ICD-10-CM

## 2013-10-27 DIAGNOSIS — K219 Gastro-esophageal reflux disease without esophagitis: Secondary | ICD-10-CM

## 2013-10-27 DIAGNOSIS — F411 Generalized anxiety disorder: Secondary | ICD-10-CM

## 2013-10-27 DIAGNOSIS — M810 Age-related osteoporosis without current pathological fracture: Secondary | ICD-10-CM

## 2013-10-27 DIAGNOSIS — D6800 Von Willebrand disease, unspecified: Secondary | ICD-10-CM

## 2013-10-27 DIAGNOSIS — F329 Major depressive disorder, single episode, unspecified: Secondary | ICD-10-CM

## 2013-10-27 DIAGNOSIS — Z23 Encounter for immunization: Secondary | ICD-10-CM

## 2013-10-27 DIAGNOSIS — Z8659 Personal history of other mental and behavioral disorders: Secondary | ICD-10-CM

## 2013-10-27 MED ORDER — FLUOXETINE HCL 20 MG PO CAPS: 20.0000 mg | ORAL_CAPSULE | Freq: Every day | ORAL | Status: DC

## 2013-10-27 MED ORDER — METOPROLOL SUCCINATE ER 50 MG PO TB24: 50.0000 mg | ORAL_TABLET | Freq: Every day | ORAL | Status: DC

## 2013-10-27 NOTE — Progress Notes (Signed)
This chart was scribed for Lauren Sia, MD by Lauren [name], ED Scribe. This patient was seen in room 14 and the patient's care was started at 7:07 PM. Subjective:    Patient ID: Lauren [name], female    DOB: [birth year], 53 y.o.   MRN: [id number]  Chief Complaint  Patient presents with  . Annual Exam    no pap    HPI HPI Comments Lauren [name] is a 53 y.o. female who presents to [facility] for her annual exam. Pt has not had her Tetanus Vaccine updated in the past 10 years.  Pt is complaining of right sided hip pain. She states that she occasionally feels her hip catching. She occasionally experiences paraesthesia in her right hand. She states that the other day when she was at the computer she had to shake her hand in order to get the tingling and numbness to go away.   Pt has a history of allergies. She denies her allergies making her cough or congestion worse.   She states that she is not taking Xanax any more.  Pt states that she used to work out at [employer] regularly, however, lately she has not been able to work out as much as she wanted.   Patient Active Problem List   Diagnosis Date Noted  . Benign neoplasm of colon * 07/29/2013  . Von Willebrand disease--- intermittently active  07/17/2013  . Osteoporosis, unspecified 04/04/2013  . VITAMIN B12 DEFICIENCY 04/05/2010  . SINOATRIAL NODE DYSFUNCTION-resolved by ablation  10/15/2009  . DEPRESSION, HX OF--- stable  09/17/2009  . HEMORRHOIDS-EXTERNAL--stable  05/18/2009  . ANXIETY--- stable 02/26/2008  . HYPERTENSION--- stable  02/26/2008  . GERD-- stable 02/26/2008  . ULCERATIVE COLITIS (off Remicade and doing well on Lialda) 02/26/2008    Past Surgical History  Procedure Laterality Date  . Brain meningioma excision  05/2007  . Implantation bone anchored hearing aid    . Cardiac electrophysiology study and ablation  10/22/09  . Colonoscopy N/A 07/29/2013    Procedure: COLONOSCOPY;  Surgeon: Hart Carwin, MD;  Location: [facility]  ENDOSCOPY;  Service: Endoscopy;  Laterality: N/A;  . Wisdom tooth extraction     Allergies  Allergen Reactions  . Atenolol     Caused swelling  . Cephalexin     Upset her colitis   Prior to Admission medications   Medication Sig Start Date End Date Taking? Authorizing Provider  azaTHIOprine (IMURAN) 50 MG tablet Take 50 mg by mouth daily.   Yes Historical Provider, MD  FLUoxetine (PROZAC) 20 MG capsule Take 1 capsule (20 mg total) by mouth daily. PATIENT NEEDS OFFICE VISIT FOR ADDITIONAL REFILLS - 2nd NOTICE 04/04/13  Yes Collene Gobble, MD  LIALDA 1.2 G EC tablet TAKE 4 TABLETS BY MOUTH EVERY DAY 10/01/13  Yes Hart Carwin, MD  metoprolol succinate (TOPROL-XL) 50 MG 24 hr tablet Take 1 tablet (50 mg total) by mouth daily. PATIENT NEEDS OFFICE VISIT FOR ADDITIONAL REFILLS   Yes Godfrey Pick, PA-C  Multiple Vitamin (MULTIVITAMIN) capsule Take 2 capsules by mouth daily.    Yes Historical Provider, MD  potassium chloride SA (K-DUR,KLOR-CON) 20 MEQ tablet Take 20 mEq by mouth 2 (two) times daily.   Yes Historical Provider, MD  Vitamin D, Ergocalciferol, (DRISDOL) 50000 UNITS CAPS capsule Take 1 capsule (50,000 Units total) by mouth every 7 (seven) days. 09/12/13  Yes Brook E Amundson de Gwenevere Ghazi, MD   History   Social History  . Marital Status:  Married    Spouse Name: N/A    Number of Children: 1  . 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 Smoking status: Never Smoker   . Smokeless tobacco: Never Used  . Alcohol Use: No  . Drug Use: No  . Sexual Activity: No   Other Topics Concern  . Not on file   Social History Narrative  . No narrative on file     Review of Systems  Constitutional: Negative for activity change, appetite change, fatigue and unexpected weight change.  HENT: Negative.   Eyes: Negative.   Respiratory: Negative.   Cardiovascular: Negative.     Gastrointestinal: Negative for nausea, abdominal pain, diarrhea, constipation, anal bleeding and rectal pain.  Endocrine: Negative.   Genitourinary: Negative for frequency, difficulty urinating and pelvic pain.  Musculoskeletal: Negative for arthralgias and back pain.  Neurological: Negative for dizziness and headaches.  Hematological: Negative for adenopathy. Does not bruise/bleed easily.  Psychiatric/Behavioral: Negative for behavioral problems and sleep disturbance.       Objective:   Physical Exam  Nursing note and vitals reviewed. Constitutional: She is oriented to person, place, and time. She appears well-developed and well-nourished. No distress.  HENT:  Head: Normocephalic and atraumatic.  Right Ear: External ear normal.  Left Ear: External ear normal.  Nose: Nose normal.  Mouth/Throat: Oropharynx is clear and moist.  Eyes: Conjunctivae and EOM are normal. Pupils are equal, round, and reactive to light.  Neck: Normal range of motion. Neck supple. No thyromegaly present.  Cardiovascular: Normal rate, regular rhythm, normal heart sounds and intact distal pulses.   No murmur heard. Pulmonary/Chest: Effort normal and breath sounds normal. No respiratory distress.  Abdominal: Soft. Bowel sounds are normal. She exhibits no distension and no mass. There is no hepatosplenomegaly, splenomegaly or hepatomegaly. There is no tenderness.  Musculoskeletal: She exhibits no edema and no tenderness.  Lymphadenopathy:    She has no cervical adenopathy.  Neurological: She is alert and oriented to person, place, and time. She has normal reflexes. No cranial nerve deficit.  Skin: Skin is warm and dry.  Psychiatric: She has a normal mood and affect. Her behavior is normal. Thought content normal.     Filed Vitals:   10/27/13 1815  BP: 136/84  Pulse: 59  Temp: 98.3 F (36.8 C)  Resp: 14  Height: 4' 11.5" (1.511 m)  Weight: 193 lb (87.544 kg)  SpO2: 98%        Assessment & Plan:   Routine general medical examination at a health care facility - Plan: Td vaccine greater than or equal to 7yo preservative free IM  Depression - Plan: FLUoxetine (PROZAC) 20 MG capsule  ANXIETY  DEPRESSION, HX OF  GERD  HYPERTENSION  Osteoporosis, unspecified  Sinoatrial node dysfunction  Von Willebrand disease  Meds ordered this encounter  Medications  . metoprolol succinate (TOPROL-XL) 50 MG 24 hr tablet    Sig: Take 1 tablet (50 mg total) by mouth daily.    Dispense:  90 tablet    Refill:  3  . FLUoxetine (PROZAC) 20 MG capsule    Sig: Take 1 capsule (20 mg total) by mouth daily.    Dispense:  90 capsule    Refill:  3   Routine labs will be done next week when she has her followup with Dr. Juanda Chance   I have completed the patient encounter in its  entirety as documented by the scribe, with editing by me where necessary. Saloni Lablanc P. Merla Riches, M.D.

## 2013-10-31 ENCOUNTER — Ambulatory Visit (INDEPENDENT_AMBULATORY_CARE_PROVIDER_SITE_OTHER): Payer: BC Managed Care – PPO | Admitting: Internal Medicine

## 2013-10-31 ENCOUNTER — Other Ambulatory Visit (INDEPENDENT_AMBULATORY_CARE_PROVIDER_SITE_OTHER): Payer: BC Managed Care – PPO

## 2013-10-31 ENCOUNTER — Encounter: Payer: Self-pay | Admitting: Internal Medicine

## 2013-10-31 VITALS — BP 98/54 | HR 64 | Ht 59.0 in | Wt 192.5 lb

## 2013-10-31 DIAGNOSIS — K5190 Ulcerative colitis, unspecified, without complications: Secondary | ICD-10-CM

## 2013-10-31 DIAGNOSIS — D849 Immunodeficiency, unspecified: Secondary | ICD-10-CM

## 2013-10-31 DIAGNOSIS — Z79899 Other long term (current) drug therapy: Secondary | ICD-10-CM

## 2013-10-31 LAB — CBC WITH DIFFERENTIAL/PLATELET
Basophils Absolute: 0 10*3/uL (ref 0.0–0.1)
Basophils Relative: 0.5 % (ref 0.0–3.0)
Eosinophils Absolute: 0.2 10*3/uL (ref 0.0–0.7)
Eosinophils Relative: 2.3 % (ref 0.0–5.0)
HCT: 42.8 % (ref 36.0–46.0)
Hemoglobin: 14.5 g/dL (ref 12.0–15.0)
Lymphocytes Relative: 24.5 % (ref 12.0–46.0)
Lymphs Abs: 1.6 10*3/uL (ref 0.7–4.0)
MCHC: 33.8 g/dL (ref 30.0–36.0)
MCV: 89.6 fl (ref 78.0–100.0)
Monocytes Absolute: 0.6 10*3/uL (ref 0.1–1.0)
Monocytes Relative: 9 % (ref 3.0–12.0)
Neutro Abs: 4.2 10*3/uL (ref 1.4–7.7)
Neutrophils Relative %: 63.7 % (ref 43.0–77.0)
Platelets: 246 10*3/uL (ref 150.0–400.0)
RBC: 4.78 Mil/uL (ref 3.87–5.11)
RDW: 13.9 % (ref 11.5–14.6)
WBC: 6.7 10*3/uL (ref 4.5–10.5)

## 2013-10-31 LAB — LIPID PANEL
Cholesterol: 198 mg/dL (ref 0–200)
HDL: 54.1 mg/dL (ref >39.00)
LDL Cholesterol: 130 mg/dL — ABNORMAL HIGH (ref 0–99)
Total CHOL/HDL Ratio: 4
Triglycerides: 69 mg/dL (ref 0.0–149.0)
VLDL: 13.8 mg/dL (ref 0.0–40.0)

## 2013-10-31 LAB — COMPREHENSIVE METABOLIC PANEL
ALT: 20 U/L (ref 0–35)
AST: 24 U/L (ref 0–37)
Albumin: 4.1 g/dL (ref 3.5–5.2)
Alkaline Phosphatase: 66 U/L (ref 39–117)
BUN: 9 mg/dL (ref 6–23)
CO2: 27 mEq/L (ref 19–32)
Calcium: 9.8 mg/dL (ref 8.4–10.5)
Chloride: 102 mEq/L (ref 96–112)
Creatinine, Ser: 0.7 mg/dL (ref 0.4–1.2)
GFR: 97.98 mL/min (ref >60.00)
Glucose, Bld: 81 mg/dL (ref 70–99)
Potassium: 4 mEq/L (ref 3.5–5.1)
Sodium: 137 mEq/L (ref 135–145)
Total Bilirubin: 0.6 mg/dL (ref 0.3–1.2)
Total Protein: 7.7 g/dL (ref 6.0–8.3)

## 2013-10-31 LAB — VITAMIN B12: Vitamin B-12: 791 pg/mL (ref 211–911)

## 2013-10-31 LAB — SEDIMENTATION RATE: Sed Rate: 20 mm/hr (ref 0–22)

## 2013-10-31 NOTE — Patient Instructions (Addendum)
Your physician has requested that you go to the basement for the following lab work before leaving today: CBC, Sed Rate, CMET, Lipid panel, B12  Please reduce your azathioprine to 25 mg daily.  Follow up in 6 months. Dr Doolittle/Dr Everlene Farrier

## 2013-10-31 NOTE — Progress Notes (Signed)
Lauren [name] July 31, 1961 [id number]  Note: This dictation was prepared with Dragon digital system. Any transcriptional errors that result from this procedure are unintentional.   History of Present Illness: This is a 53 year old white female with the ulcerative colitis of greater than 10 years duration. Remicade he discontinued in April 2014. She has been in remission for almost 2 years. Imuran has been slowly reduced from 100 mg a day to 50 mg a day. She still remains on mesalamine 4.8 g daily. She has a history of pseudomembranous colitis in 2012. Last colonoscopy in December 2014 showed mild colitis from 0-30 cm and minimal active colitis from 30-50 cm. Right colon was normal on previous colonoscopy in 2012 she was found to have at all mild glandular atypia in the left colon and inflammatory polyps. She denies diarrhea abdominal pain or rectal bleeding.    Past Medical History  Diagnosis Date  . Ulcerative colitis   . SVT (supraventricular tachycardia)   . Personal history of meningioma of the brain   . Von Willebrands disease   . Anxiety and depression   . Vitamin B12 deficiency   . GERD (gastroesophageal reflux disease)   . Tennis elbow   . MRSA (methicillin resistant Staphylococcus aureus)   . Hypertension   . Osteopenia   . Clotting disorder   . Anemia   . Internal hemorrhoids   . Inflammatory polyps of colon     Past Surgical History  Procedure Laterality Date  . Brain meningioma excision  05/2007  . Implantation bone anchored hearing aid    . Cardiac electrophysiology study and ablation  10/22/09  . Colonoscopy N/A 07/29/2013    Procedure: COLONOSCOPY;  Surgeon: Lafayette Dragon, MD;  Location: [facility] ENDOSCOPY;  Service: Endoscopy;  Laterality: N/A;  . Wisdom tooth extraction      Allergies  Allergen Reactions  . Atenolol     Caused swelling  . Cephalexin     Upset her colitis    Family history and social history have been reviewed.  Review of Systems: Denies  heartburn chest pain abdominal pain rectal bleeding  The remainder of the 10 point ROS is negative except as outlined in the H&P  Physical Exam: General Appearance Well developed, in no distress Eyes  Non icteric  HEENT  Non traumatic, normocephalic  Mouth No lesion, tongue papillated, no cheilosis Neck Supple without adenopathy, thyroid not enlarged, no carotid bruits, no JVD Lungs Clear to auscultation bilaterally COR Normal S1, normal S2, regular rhythm, no murmur, quiet precordium Abdomen soft nontender with normoactive bowel sounds. No mass Rectal large external hemorrhoids. Hemoccult negative stool Extremities  No pedal edema Skin No lesions Neurological Alert and oriented x 3 Psychological Normal mood and affect  Assessment and Plan:   53 year old white female with the ulcerative colitis of greater than 10 years duration and recent colonoscopy showing mild activity in the left colon. She will  reduce Imuran to 25 mg daily for 3 months and then discontinue. She will continue mesalamine 4.8 g daily until I see her in 6 months. Today will checking  B12, CBC, lipid profile and metabolic panel. Recall colonoscopy in 3-5 years    Delfin Edis 10/31/2013

## 2013-11-11 ENCOUNTER — Other Ambulatory Visit: Payer: Self-pay | Admitting: Physician Assistant

## 2013-11-12 ENCOUNTER — Other Ambulatory Visit: Payer: Self-pay | Admitting: Dermatology

## 2013-11-30 ENCOUNTER — Other Ambulatory Visit: Payer: Self-pay | Admitting: Internal Medicine

## 2013-12-29 ENCOUNTER — Other Ambulatory Visit: Payer: Self-pay | Admitting: Internal Medicine

## 2013-12-29 DIAGNOSIS — K5190 Ulcerative colitis, unspecified, without complications: Secondary | ICD-10-CM

## 2014-01-08 ENCOUNTER — Other Ambulatory Visit (INDEPENDENT_AMBULATORY_CARE_PROVIDER_SITE_OTHER): Payer: BC Managed Care – PPO

## 2014-01-08 DIAGNOSIS — K5190 Ulcerative colitis, unspecified, without complications: Secondary | ICD-10-CM

## 2014-01-08 LAB — BASIC METABOLIC PANEL
BUN: 14 mg/dL (ref 6–23)
CO2: 28 mEq/L (ref 19–32)
Calcium: 10.1 mg/dL (ref 8.4–10.5)
Chloride: 103 mEq/L (ref 96–112)
Creatinine, Ser: 0.6 mg/dL (ref 0.4–1.2)
GFR: 103.22 mL/min (ref >60.00)
Glucose, Bld: 85 mg/dL (ref 70–99)
Potassium: 3.8 mEq/L (ref 3.5–5.1)
Sodium: 138 mEq/L (ref 135–145)

## 2014-01-09 ENCOUNTER — Other Ambulatory Visit: Payer: Self-pay | Admitting: *Deleted

## 2014-01-09 DIAGNOSIS — E876 Hypokalemia: Secondary | ICD-10-CM

## 2014-01-14 ENCOUNTER — Other Ambulatory Visit: Payer: Self-pay | Admitting: Internal Medicine

## 2014-02-05 ENCOUNTER — Telehealth: Payer: Self-pay | Admitting: *Deleted

## 2014-02-05 NOTE — Telephone Encounter (Signed)
Message copied by Hulan Saas on Thu Feb 05, 2014 11:16 AM ------      Message from: Hulan Saas      Created: Fri [name] 09, 2014  8:42 AM       Call and remind patient due for BMET for DB on 02/09/14 Lab in EPIC ------

## 2014-02-05 NOTE — Telephone Encounter (Signed)
Patient will come for labs.  

## 2014-02-05 NOTE — Telephone Encounter (Signed)
Left a message for patient to call me. 

## 2014-02-12 ENCOUNTER — Other Ambulatory Visit (INDEPENDENT_AMBULATORY_CARE_PROVIDER_SITE_OTHER): Payer: BC Managed Care – PPO

## 2014-02-12 DIAGNOSIS — E876 Hypokalemia: Secondary | ICD-10-CM

## 2014-02-12 LAB — BASIC METABOLIC PANEL
BUN: 14 mg/dL (ref 6–23)
CO2: 29 mEq/L (ref 19–32)
Calcium: 9.5 mg/dL (ref 8.4–10.5)
Chloride: 101 mEq/L (ref 96–112)
Creatinine, Ser: 0.6 mg/dL (ref 0.4–1.2)
GFR: 107.04 mL/min (ref >60.00)
Glucose, Bld: 90 mg/dL (ref 70–99)
Potassium: 4.8 mEq/L (ref 3.5–5.1)
Sodium: 138 mEq/L (ref 135–145)

## 2014-02-13 ENCOUNTER — Other Ambulatory Visit: Payer: Self-pay | Admitting: *Deleted

## 2014-02-13 DIAGNOSIS — K51219 Ulcerative (chronic) proctitis with unspecified complications: Secondary | ICD-10-CM

## 2014-02-25 ENCOUNTER — Other Ambulatory Visit: Payer: Self-pay | Admitting: Internal Medicine

## 2014-03-23 ENCOUNTER — Other Ambulatory Visit: Payer: Self-pay | Admitting: Internal Medicine

## 2014-03-24 ENCOUNTER — Other Ambulatory Visit: Payer: Self-pay | Admitting: Emergency Medicine

## 2014-04-04 ENCOUNTER — Encounter: Payer: Self-pay | Admitting: Interventional Cardiology

## 2014-04-09 ENCOUNTER — Ambulatory Visit (INDEPENDENT_AMBULATORY_CARE_PROVIDER_SITE_OTHER): Payer: BC Managed Care – PPO | Admitting: Family Medicine

## 2014-04-09 ENCOUNTER — Telehealth: Payer: Self-pay | Admitting: *Deleted

## 2014-04-09 VITALS — BP 128/82 | HR 68 | Temp 98.0°F | Resp 16 | Ht 59.0 in | Wt 199.0 lb

## 2014-04-09 DIAGNOSIS — S31109A Unspecified open wound of abdominal wall, unspecified quadrant without penetration into peritoneal cavity, initial encounter: Secondary | ICD-10-CM

## 2014-04-09 MED ORDER — MUPIROCIN 2 % EX OINT: 1.0000 "application " | TOPICAL_OINTMENT | Freq: Two times a day (BID) | CUTANEOUS | Status: DC

## 2014-04-09 NOTE — Telephone Encounter (Signed)
Left a message for patient to call back. 

## 2014-04-09 NOTE — Patient Instructions (Signed)
Septra or Doxycycline

## 2014-04-09 NOTE — Progress Notes (Signed)
   Subjective:    Patient ID: Lauren [name], female    DOB: [birth year], 53 y.o.   MRN: [id number]  HPI 53 year old female presents for evaluation of a possible skin infection on her abdomen. States she first noticed an erythematous area on the lower part of her abdomen this morning, although she admits it is possible it has been there longer.  States it is slightly tender to touch. She has not noticed any drainage, warmth, or induration. No fevers, chills, nausea, vomiting, headache, or abdominal pain. Admits she does have a hx of MRSA in the past, but that was "years" ago.   PMHx significant for UC that is in remission. Reports she does normally have to take probiotics while on antibiotics to prevent relapse      Review of Systems  Constitutional: Negative for fever and chills.  Gastrointestinal: Negative for nausea, vomiting and abdominal pain.  Skin: Positive for color change and wound.  Neurological: Negative for dizziness and headaches.       Objective:   Physical Exam  Constitutional: She is oriented to person, place, and time. She appears well-developed and well-nourished.  HENT:  Head: Normocephalic and atraumatic.  Right Ear: External ear normal.  Left Ear: External ear normal.  Eyes: Conjunctivae are normal.  Neck: Normal range of motion.  Cardiovascular: Normal rate.   Pulmonary/Chest: Effort normal.  Neurological: She is alert and oriented to person, place, and time.  Skin:     Psychiatric: She has a normal mood and affect. Her behavior is normal. Judgment and thought content normal.     #20 G needle used to aspirate center of lesion. No purulence expressed. Scant bloody drainage. Culture collected.      Assessment & Plan:  Open wound of abdomen, initial encounter - Plan: mupirocin ointment (BACTROBAN) 2 %, Wound culture  Likely MRSA vs Staph infection - early. Due to pt hx of UC, will treat conservatively with mupirocin ointment topically twice daily. Warm  compresses. Keep bandage in place. RTC precautions discussed. Recommend recheck in 48 hours if no improvement, sooner if worse.

## 2014-04-09 NOTE — Telephone Encounter (Signed)
Message copied by Hulan Saas on Thu Apr 09, 2014 10:54 AM ------      Message from: Hulan Saas      Created: Fri Feb 13, 2014  9:27 AM       Call and remind due for BMEt for DB on 04/13/14. Lab in epic. ------

## 2014-04-12 LAB — WOUND CULTURE
Gram Stain: NONE SEEN
Gram Stain: NONE SEEN
Gram Stain: NONE SEEN
Organism ID, Bacteria: NO GROWTH

## 2014-04-13 NOTE — Telephone Encounter (Signed)
Left a message for patient to call back. 

## 2014-04-14 ENCOUNTER — Other Ambulatory Visit (INDEPENDENT_AMBULATORY_CARE_PROVIDER_SITE_OTHER): Payer: BC Managed Care – PPO

## 2014-04-14 DIAGNOSIS — K51219 Ulcerative (chronic) proctitis with unspecified complications: Secondary | ICD-10-CM

## 2014-04-14 DIAGNOSIS — K5120 Ulcerative (chronic) proctitis without complications: Secondary | ICD-10-CM

## 2014-04-14 LAB — BASIC METABOLIC PANEL
BUN: 13 mg/dL (ref 6–23)
CO2: 28 mEq/L (ref 19–32)
Calcium: 9.5 mg/dL (ref 8.4–10.5)
Chloride: 101 mEq/L (ref 96–112)
Creatinine, Ser: 0.6 mg/dL (ref 0.4–1.2)
GFR: 106.97 mL/min (ref >60.00)
Glucose, Bld: 79 mg/dL (ref 70–99)
Potassium: 4 mEq/L (ref 3.5–5.1)
Sodium: 138 mEq/L (ref 135–145)

## 2014-04-14 NOTE — Telephone Encounter (Signed)
Patient will come for labs this week.

## 2014-04-15 ENCOUNTER — Other Ambulatory Visit: Payer: Self-pay | Admitting: *Deleted

## 2014-04-15 DIAGNOSIS — E876 Hypokalemia: Secondary | ICD-10-CM

## 2014-04-24 ENCOUNTER — Other Ambulatory Visit: Payer: Self-pay | Admitting: Internal Medicine

## 2014-04-25 NOTE — Progress Notes (Signed)
History and physical examinations reviewed in detail; pt examined by myself as well. Agree with assessment and plan.

## 2014-05-26 ENCOUNTER — Ambulatory Visit: Payer: BC Managed Care – PPO | Admitting: Internal Medicine

## 2014-06-02 ENCOUNTER — Ambulatory Visit (INDEPENDENT_AMBULATORY_CARE_PROVIDER_SITE_OTHER): Payer: BC Managed Care – PPO | Admitting: Internal Medicine

## 2014-06-02 ENCOUNTER — Encounter: Payer: Self-pay | Admitting: Internal Medicine

## 2014-06-02 ENCOUNTER — Other Ambulatory Visit (INDEPENDENT_AMBULATORY_CARE_PROVIDER_SITE_OTHER): Payer: BC Managed Care – PPO

## 2014-06-02 VITALS — BP 120/70 | HR 60 | Ht 59.0 in | Wt 197.4 lb

## 2014-06-02 DIAGNOSIS — D6800 Von Willebrand disease, unspecified: Secondary | ICD-10-CM

## 2014-06-02 DIAGNOSIS — K5190 Ulcerative colitis, unspecified, without complications: Secondary | ICD-10-CM

## 2014-06-02 LAB — CBC WITH DIFFERENTIAL/PLATELET
Basophils Absolute: 0 10*3/uL (ref 0.0–0.1)
Basophils Relative: 0.4 % (ref 0.0–3.0)
Eosinophils Absolute: 0.2 10*3/uL (ref 0.0–0.7)
Eosinophils Relative: 3.3 % (ref 0.0–5.0)
HCT: 44.1 % (ref 36.0–46.0)
Hemoglobin: 14.8 g/dL (ref 12.0–15.0)
Lymphocytes Relative: 29.2 % (ref 12.0–46.0)
Lymphs Abs: 1.7 10*3/uL (ref 0.7–4.0)
MCHC: 33.5 g/dL (ref 30.0–36.0)
MCV: 86.8 fl (ref 78.0–100.0)
Monocytes Absolute: 0.5 10*3/uL (ref 0.1–1.0)
Monocytes Relative: 8.1 % (ref 3.0–12.0)
Neutro Abs: 3.5 10*3/uL (ref 1.4–7.7)
Neutrophils Relative %: 59 % (ref 43.0–77.0)
Platelets: 246 10*3/uL (ref 150.0–400.0)
RBC: 5.07 Mil/uL (ref 3.87–5.11)
RDW: 13.8 % (ref 11.5–15.5)
WBC: 5.9 10*3/uL (ref 4.0–10.5)

## 2014-06-02 LAB — SEDIMENTATION RATE: Sed Rate: 21 mm/hr (ref 0–22)

## 2014-06-02 NOTE — Patient Instructions (Addendum)
Your physician has requested that you go to the basement for the following lab work before leaving today: CBC, Sed Rate  Please decrease your potassium to one tablet daily.  Please follow up with Dr Olevia Perches in 6 months.  Your physician has requested that you go to the basement for the following lab work 06/30/14: BMET  CC:Dr Arlyss Queen

## 2014-06-02 NOTE — Progress Notes (Signed)
  Lauren [name] [birth year] [id number]  Note: This dictation was prepared with Dragon digital system. Any transcriptional errors that result from this procedure are unintentional.   History of Present Illness:  This is a 54 year old white female with ulcerative colitis of greater than 10 years duration who has been followed by me since July 2009. She was on Remicade until April 2014 and Imuran which has been slowly reduced and stopped since her last appointment in March 2015. She is in remission. There has been no abdominal pain, rectal bleeding or diarrhea. She continues mesalamine 4.8 g daily. She has a history of pseudomembranous colitis. Her last colonoscopy in December 2014 showed chronic active colitis from 0-30 cm. There was an inflammatory polyp which was removed. Her last hemoglobin was 14.5, potassium 4.0. She denies symptoms of gastroesophageal reflux.    Past Medical History  Diagnosis Date  . Ulcerative colitis   . SVT (supraventricular tachycardia)   . Personal history of meningioma of the brain   . Von Willebrands disease   . Anxiety and depression   . Vitamin B12 deficiency   . GERD (gastroesophageal reflux disease)   . Tennis elbow   . MRSA (methicillin resistant Staphylococcus aureus)   . Hypertension   . Osteopenia   . Clotting disorder   . Anemia   . Internal hemorrhoids   . Inflammatory polyps of colon     Past Surgical History  Procedure Laterality Date  . Brain meningioma excision  05/2007  . Implantation bone anchored hearing aid    . Cardiac electrophysiology study and ablation  10/22/09  . Colonoscopy N/A 07/29/2013    Procedure: COLONOSCOPY;  Surgeon: Lafayette Dragon, MD;  Location: [facility] ENDOSCOPY;  Service: Endoscopy;  Laterality: N/A;  . Wisdom tooth extraction      Allergies  Allergen Reactions  . Atenolol     Caused swelling  . Cephalexin     Upset her colitis    Family history and social history have been reviewed.  Review of Systems:  Negative for diarrhea abdominal pain or rectal bleeding  The remainder of the 10 point ROS is negative except as outlined in the H&P  Physical Exam: General Appearance Well developed, in no distress Eyes  Non icteric  HEENT  Non traumatic, normocephalic  Mouth No lesion, tongue papillated, no cheilosis Neck Supple without adenopathy, thyroid not enlarged, no carotid bruits, no JVD Lungs Clear to auscultation bilaterally COR Normal S1, normal S2, regular rhythm, no murmur, quiet precordium Abdomen soft nontender abdomen with normal active bowel sounds. No distention. Liver edge at costal margin Rectal external hemorrhoidal tags. Hemoccult negative stool. Extremities  No pedal edema Skin No lesions Neurological Alert and oriented x 3 Psychological Normal mood and affect  Assessment and Plan:   Problem #40 53 year old white female with ulcerative colitis of greater than 10 years duration who had chronic minimally active colitis on her last colonoscopy 1 year ago. She will continue mesalamine 4.8 g daily and will reduce potassium to 20 mEq daily. I will see her in 6 months. Today, we will check a CBC. We will recheck potassium in 4 weeks.    Delfin Edis 06/02/2014

## 2014-06-23 ENCOUNTER — Other Ambulatory Visit: Payer: Self-pay | Admitting: Internal Medicine

## 2014-06-29 ENCOUNTER — Encounter: Payer: Self-pay | Admitting: Internal Medicine

## 2014-07-09 ENCOUNTER — Telehealth: Payer: Self-pay | Admitting: *Deleted

## 2014-07-09 ENCOUNTER — Other Ambulatory Visit: Payer: Self-pay | Admitting: *Deleted

## 2014-07-09 DIAGNOSIS — E876 Hypokalemia: Secondary | ICD-10-CM

## 2014-07-09 NOTE — Telephone Encounter (Signed)
Spoke with patient and she will come for labs next week. 

## 2014-07-09 NOTE — Telephone Encounter (Signed)
-----   Message from Hulan Saas, RN sent at 04/15/2014  8:42 AM EDT ----- Call and remind due for Bmet on 07/13/14 for  DB. Lab in [facility]

## 2014-07-09 NOTE — Telephone Encounter (Signed)
Left a message for patient to call back. 

## 2014-07-16 ENCOUNTER — Other Ambulatory Visit: Payer: Self-pay

## 2014-07-16 DIAGNOSIS — Z1231 Encounter for screening mammogram for malignant neoplasm of breast: Secondary | ICD-10-CM

## 2014-07-20 ENCOUNTER — Other Ambulatory Visit (INDEPENDENT_AMBULATORY_CARE_PROVIDER_SITE_OTHER): Payer: BC Managed Care – PPO

## 2014-07-20 DIAGNOSIS — E876 Hypokalemia: Secondary | ICD-10-CM

## 2014-07-21 LAB — BASIC METABOLIC PANEL
BUN: 12 mg/dL (ref 6–23)
CO2: 27 mEq/L (ref 19–32)
Calcium: 9.6 mg/dL (ref 8.4–10.5)
Chloride: 100 mEq/L (ref 96–112)
Creatinine, Ser: 0.6 mg/dL (ref 0.4–1.2)
GFR: 106.86 mL/min (ref >60.00)
Glucose, Bld: 79 mg/dL (ref 70–99)
Potassium: 4.1 mEq/L (ref 3.5–5.1)
Sodium: 137 mEq/L (ref 135–145)

## 2014-07-25 ENCOUNTER — Other Ambulatory Visit: Payer: Self-pay | Admitting: Internal Medicine

## 2014-08-13 ENCOUNTER — Ambulatory Visit
Admission: RE | Admit: 2014-08-13 | Discharge: 2014-08-13 | Disposition: A | Discharge status: Home / Self Care | Payer: BC Managed Care – PPO | Source: Ambulatory Visit

## 2014-08-13 DIAGNOSIS — Z1231 Encounter for screening mammogram for malignant neoplasm of breast: Secondary | ICD-10-CM

## 2014-09-18 ENCOUNTER — Ambulatory Visit: Payer: BC Managed Care – PPO | Admitting: Obstetrics and Gynecology

## 2014-10-01 ENCOUNTER — Encounter: Payer: Self-pay | Admitting: Certified Nurse Midwife

## 2014-10-01 ENCOUNTER — Ambulatory Visit (INDEPENDENT_AMBULATORY_CARE_PROVIDER_SITE_OTHER): Payer: BLUE CROSS/BLUE SHIELD | Admitting: Certified Nurse Midwife

## 2014-10-01 VITALS — BP 116/72 | HR 70 | Resp 16 | Ht 58.75 in | Wt 201.0 lb

## 2014-10-01 DIAGNOSIS — Z01419 Encounter for gynecological examination (general) (routine) without abnormal findings: Secondary | ICD-10-CM

## 2014-10-01 DIAGNOSIS — N342 Other urethritis: Secondary | ICD-10-CM

## 2014-10-01 DIAGNOSIS — Z124 Encounter for screening for malignant neoplasm of cervix: Secondary | ICD-10-CM

## 2014-10-01 DIAGNOSIS — Z0000 Encounter for general adult medical examination without abnormal findings: Secondary | ICD-10-CM

## 2014-10-01 NOTE — Patient Instructions (Signed)

## 2014-10-01 NOTE — Progress Notes (Signed)
54 y.o. G1P1 Married  Caucasian Fe here for annual exam. Menopausal no HRT. Denies vaginal bleeding or vaginal dryness. PCP yearly with some labs. Sees Dr. Olevia Perches as needed as well Urology yearly. Patient has noted a small area on clavicle she is going to see Dr.Daub for evaluation. Complaining of slight urinary burning with ? Odor, no frequency or urgency , back pain, fever or chills.  Drinks water frequently, very little caffeine. No other health issues today.   Patient's last menstrual period was 11/26/2008.          Sexually active: No.  The current method of family planning is post menopausal status.    Exercising: No.  exercise Smoker:  no  Health Maintenance: Pap: 09-06-12 neg HPV HR neg MMG:  08-13-14 category b density,birads 1:neg does 3 D Colonoscopy:  2014 f/u 25yr polyps BMD:   03/28/2013 osteopenia left hip TDaP:  2015 Labs: Poct urine-small RBC's Self breast exam: rarely   reports that she has never smoked. She has never used smokeless tobacco. She reports that she does not drink alcohol or use illicit drugs.  Past Medical History  Diagnosis Date  . Ulcerative colitis   . SVT (supraventricular tachycardia)   . Personal history of meningioma of the brain   . Von Willebrands disease   . Anxiety and depression   . Vitamin B12 deficiency   . GERD (gastroesophageal reflux disease)   . Tennis elbow   . MRSA (methicillin resistant Staphylococcus aureus)   . Hypertension   . Osteopenia   . Clotting disorder   . Anemia   . Internal hemorrhoids   . Inflammatory polyps of colon     Past Surgical History  Procedure Laterality Date  . Brain meningioma excision  05/2007  . Implantation bone anchored hearing aid    . Cardiac electrophysiology study and ablation  10/22/09  . Colonoscopy N/A 07/29/2013    Procedure: COLONOSCOPY;  Surgeon: DLafayette Dragon MD;  Location: [facility] ENDOS[facility];  Service: Endoscopy;  Laterality: N/A;  . Wisdom tooth extraction      Current Outpatient  Prescriptions  Medication Sig Dispense Refill  . FLUoxetine (PROZAC) 20 MG capsule Take 1 capsule (20 mg total) by mouth daily. 90 capsule 3  . LIALDA 1.2 G EC tablet TAKE 4 TABLETS BY MOUTH EVERY DAY 120 tablet 5  . metoprolol succinate (TOPROL-XL) 50 MG 24 hr tablet Take 1 tablet (50 mg total) by mouth daily. 90 tablet 3  . Multiple Vitamin (MULTIVITAMIN) capsule Take 1 capsule by mouth daily.     . potassium chloride SA (K-DUR,KLOR-CON) 20 MEQ tablet Take 1 tablet (20 mEq total) by mouth once. 30 tablet 3  . Vitamin D, Ergocalciferol, (DRISDOL) 50000 UNITS CAPS capsule Take 1 capsule (50,000 Units total) by mouth every 7 (seven) days. 24 capsule 3   No current facility-administered medications for this visit.    Family History  Problem Relation Age of Onset  . Heart disease Maternal Grandfather   . Heart disease Mother   . Stroke Mother   . Diabetes Maternal Uncle   . Colon cancer Neg Hx     ROS:  Pertinent items are noted in HPI.  Otherwise, a comprehensive ROS was negative.  Exam:   BP 116/72 mmHg  Pulse 70  Resp 16  Ht 4' 10.75" (1.492 m)  Wt 201 lb (91.173 kg)  BMI 40.96 kg/m2  LMP 11/26/2008 Height: 4' 10.75" (149.2 cm) Ht Readings from Last 3 Encounters:  10/01/14 4' 10.75" (  1.492 m)  06/02/14 4' 11"  (1.499 m)  04/09/14 4' 11"  (1.499 m)    General appearance: alert, cooperative and appears stated age Head: Normocephalic, without obvious abnormality, atraumatic Neck: no adenopathy, supple, symmetrical, trachea midline and thyroid normal to inspection and palpation Clavicle area on right no enlarged lymph nodes or problem noted Lungs: clear to auscultation bilaterally CVAT: negative bilateral Breasts: normal appearance, no masses or tenderness, No nipple retraction or dimpling, No nipple discharge or bleeding, No axillary or supraclavicular adenopathy Heart: regular rate and rhythm Abdomen: soft, non-tender; no masses,  no organomegaly, negative  suprapubic Extremities: extremities normal, atraumatic, no cyanosis or edema Skin: Skin color, texture, turgor normal. No rashes or lesions Lymph nodes: Cervical, supraclavicular, and axillary nodes normal. No abnormal inguinal nodes palpated Neurologic: Grossly normal   Pelvic: External genitalia:  no lesions              Urethra:  normal appearing urethra with no masses, tenderness or lesions  Bladder non tender  Urethral meatus slightly red only, non tender              Bartholin's and Skene's: normal                 Vagina: normal appearing vagina with normal color and discharge, no lesions              Cervix: normal, non tender, no lesions              Pap taken: Yes.   Bimanual Exam:  Uterus:  normal size, contour, position, consistency, mobility, non-tender              Adnexa: normal adnexa and no mass, fullness, tenderness               Rectovaginal: Confirms               Anus:  normal sphincter tone, no lesions  Chaperone present: Yes  A:  Well Woman with normal exam  Menopausal no HRT  ? Urethritis R/O UTI  History of Von Willebrand's     P:   Reviewed health and wellness pertinent to exam  Aware of need to evaluate if vaginal bleeding  Will send urine for micro and culture labs, patient to call if symptoms increase. History of Hematuria no infection.  Pap smear taken today with HPV reflex  Continue follow up with MD's and have area evaluated of concern with Dr.Daub  counseled on breast self exam, mammography screening, adequate intake of calcium and vitamin D, diet and exercise return annually or prn  An After Visit Summary was printed and given to the patient.

## 2014-10-02 LAB — URINALYSIS, MICROSCOPIC ONLY
Bacteria, UA: NONE SEEN
Casts: NONE SEEN
Crystals: NONE SEEN
Squamous Epithelial / LPF: NONE SEEN

## 2014-10-03 ENCOUNTER — Ambulatory Visit (INDEPENDENT_AMBULATORY_CARE_PROVIDER_SITE_OTHER): Payer: BLUE CROSS/BLUE SHIELD

## 2014-10-03 ENCOUNTER — Ambulatory Visit (INDEPENDENT_AMBULATORY_CARE_PROVIDER_SITE_OTHER): Payer: BLUE CROSS/BLUE SHIELD | Admitting: Internal Medicine

## 2014-10-03 VITALS — BP 122/80 | HR 63 | Temp 98.4°F | Resp 18 | Ht 61.0 in | Wt 201.2 lb

## 2014-10-03 DIAGNOSIS — M898X1 Other specified disorders of bone, shoulder: Secondary | ICD-10-CM

## 2014-10-03 DIAGNOSIS — M25511 Pain in right shoulder: Secondary | ICD-10-CM

## 2014-10-03 DIAGNOSIS — M89319 Hypertrophy of bone, unspecified shoulder: Secondary | ICD-10-CM

## 2014-10-03 DIAGNOSIS — F329 Major depressive disorder, single episode, unspecified: Secondary | ICD-10-CM

## 2014-10-03 DIAGNOSIS — M898X8 Other specified disorders of bone, other site: Secondary | ICD-10-CM

## 2014-10-03 DIAGNOSIS — F411 Generalized anxiety disorder: Secondary | ICD-10-CM

## 2014-10-03 DIAGNOSIS — F32A Depression, unspecified: Secondary | ICD-10-CM

## 2014-10-03 DIAGNOSIS — I10 Essential (primary) hypertension: Secondary | ICD-10-CM

## 2014-10-03 LAB — URINE CULTURE: Colony Count: 50000

## 2014-10-03 MED ORDER — FLUOXETINE HCL 20 MG PO CAPS: 20.0000 mg | ORAL_CAPSULE | Freq: Every day | ORAL | Status: DC

## 2014-10-03 MED ORDER — METOPROLOL SUCCINATE ER 50 MG PO TB24: 50.0000 mg | ORAL_TABLET | Freq: Every day | ORAL | Status: DC

## 2014-10-03 NOTE — Progress Notes (Addendum)
Subjective:  This chart was scribed for Lauren Sia, MD by Haywood Pao, ED Scribe at [facility].The patient was seen in exam room 03 and the patient's care was started at 11:03 AM.   Patient ID: Lauren [name], female    DOB: 1961/04/05, 54 y.o.   MRN: [id number] Chief Complaint  Patient presents with  . lump on collarbone    noticed it on last Saturday; pt states that is growing and painful   HPI HPI Comments: Lauren [name] is a 54 y.o. female who presents to [facility] complaining of a tender enlarged area on the right proximal clavicle which she noticed it last Saturday. Area is painful to the touch and has grown since onset. No pain with movement, pt states she only experiences pain when touching the area. She was seen by her gynecologist on Thursday and told to come here.  She also is due for follow-up for medicine refills for her depression and hypertension She has been doing well with the current regimen Labs by the gynecologists were normal Her other medical problems including ulcerative colitis have been stable  Patient Active Problem List   Diagnosis Date Noted  . Benign neoplasm of colon 07/29/2013  . Von Willebrand disease 07/17/2013  . Osteoporosis, unspecified 04/04/2013  . VITAMIN B12 DEFICIENCY 04/05/2010  . SINOATRIAL NODE DYSFUNCTION 10/15/2009  . DEPRESSION, HX OF 09/17/2009  . HEMORRHOIDS-EXTERNAL 05/18/2009  . ANXIETY 02/26/2008  . HYPERTENSION 02/26/2008  . GERD 02/26/2008  . ULCERATIVE COLITIS 02/26/2008  . HOARSENESS 02/26/2008  . COUGH, CHRONIC 02/26/2008    Past Surgical History  Procedure Laterality Date  . Brain meningioma excision  05/2007  . Implantation bone anchored hearing aid    . Cardiac electrophysiology study and ablation  10/22/09  . Colonoscopy N/A 07/29/2013    Procedure: COLONOSCOPY;  Surgeon: Hart Carwin, MD;  Location: [facility];  Service: Endoscopy;  Laterality: N/A;  . Wisdom tooth extraction      Allergies  Allergen Reactions  . Atenolol     Caused swelling  . Cephalexin     Upset her colitis   Prior to Admission medications   Medication Sig Start Date End Date Taking? Authorizing Provider  FLUoxetine (PROZAC) 20 MG capsule Take 1 capsule (20 mg total) by mouth daily. 10/27/13  Yes Tonye Pearson, MD  LIALDA 1.2 G EC tablet TAKE 4 TABLETS BY MOUTH EVERY DAY 06/23/14  Yes Hart Carwin, MD  metoprolol succinate (TOPROL-XL) 50 MG 24 hr tablet Take 1 tablet (50 mg total) by mouth daily. 10/27/13  Yes Tonye Pearson, MD  Multiple Vitamin (MULTIVITAMIN) capsule Take 1 capsule by mouth daily.    Yes Historical Provider, MD  potassium chloride SA (K-DUR,KLOR-CON) 20 MEQ tablet Take 1 tablet (20 mEq total) by mouth once. 07/27/14  Yes Hart Carwin, MD  Vitamin D, Ergocalciferol, (DRISDOL) 50000 UNITS CAPS capsule Take 1 capsule (50,000 Units total) by mouth every 7 (seven) days. 09/12/13  Yes Brook E Amundson de Gwenevere Ghazi, MD    Review of Systems  Musculoskeletal: Positive for arthralgias.   no chest pain or palpitations No dyspnea on exertion No fever chills or night sweats No weight loss     Objective:  BP 122/80 mmHg  Pulse 63  Temp(Src) 98.4 F (36.9 C) (Oral)  Resp 18  Ht 5\' 1"  (1.549 m)  Wt 201 lb 3.2 oz (91.264 kg)  BMI 38.04 kg/m2  SpO2 98%  LMP  11/26/2008  Physical Exam  Constitutional: She is oriented to person, place, and time. She appears well-developed and well-nourished. No distress.  HENT:  Head: Normocephalic and atraumatic.  Nose: Nose normal.  Mouth/Throat: Oropharynx is clear and moist.  Eyes: EOM are normal. Pupils are equal, round, and reactive to light.  Neck: Normal range of motion. Neck supple. No thyromegaly present.  Cardiovascular: Normal rate, regular rhythm and normal heart sounds.   Pulmonary/Chest: Effort normal and breath sounds normal. No respiratory distress.  Musculoskeletal: Normal range of motion.  There is tenderness  at the right sternoclavicular joint with prominence/swelling between the clavicle and the rib. No supraclavicular masses. No increase in pain with shoulder range of motion on that side.  Lymphadenopathy:    She has no cervical adenopathy.  Neurological: She is alert and oriented to person, place, and time.  Skin: Skin is warm and dry.  Psychiatric: She has a normal mood and affect. Her behavior is normal.  Nursing note and vitals reviewed. [facility] reading (PRIMARY) by  Dr. Willett Lefeber=clavicle wnl But 1st rib sternal junction ragged with bony hypertrophy--can't see any underlying lung mass Extra calcium in the rotator cuff unassociated with current complaint    Assessment & Plan:  I have completed the patient encounter in its entirety as documented by the scribe, with editing by me where necessary. Cristine Daw P. Merla Riches, M.D.  Clavicle/rib enlargement ---R 1st [place] joint- new-onset with tenderness greater than 3 months  We will CT this area/I'll discuss with radiology to see which type is most appropriate  Depression - Plan: FLUoxetine (PROZAC) 20 MG capsule refilled Anxiety state  Essential hypertension-stable  Meds ordered this encounter  Medications  . FLUoxetine (PROZAC) 20 MG capsule    Sig: Take 1 capsule (20 mg total) by mouth daily.    Dispense:  90 capsule    Refill:  3  . metoprolol succinate (TOPROL-XL) 50 MG 24 hr tablet    Sig: Take 1 tablet (50 mg total) by mouth daily.    Dispense:  90 tablet    Refill:  3     Addendum: Discussed with radiology and advice was to order a CT of the chest with a limited view if possible with and without contrast

## 2014-10-05 LAB — IPS PAP TEST WITH REFLEX TO HPV

## 2014-10-05 NOTE — Progress Notes (Signed)
Reviewed personally.  M. Suzanne Corion Sherrod, MD.  

## 2014-10-08 ENCOUNTER — Telehealth: Payer: Self-pay | Admitting: Internal Medicine

## 2014-10-08 DIAGNOSIS — K51219 Ulcerative (chronic) proctitis with unspecified complications: Secondary | ICD-10-CM

## 2014-10-08 NOTE — Telephone Encounter (Signed)
Last labs in 05/2014 were normal, please obtain CBC,sed.rate,C-met

## 2014-10-08 NOTE — Telephone Encounter (Signed)
Spoke with patient and she is asking if/when she needs lab work done for Dr. Olevia Perches. Please, call.

## 2014-10-09 NOTE — Telephone Encounter (Signed)
Labs in EPIC. Patient notified.

## 2014-10-13 ENCOUNTER — Telehealth: Payer: Self-pay | Admitting: Certified Nurse Midwife

## 2014-10-13 ENCOUNTER — Other Ambulatory Visit (INDEPENDENT_AMBULATORY_CARE_PROVIDER_SITE_OTHER): Payer: BLUE CROSS/BLUE SHIELD

## 2014-10-13 DIAGNOSIS — E559 Vitamin D deficiency, unspecified: Secondary | ICD-10-CM

## 2014-10-13 DIAGNOSIS — K51219 Ulcerative (chronic) proctitis with unspecified complications: Secondary | ICD-10-CM | POA: Diagnosis not present

## 2014-10-13 LAB — CBC WITH DIFFERENTIAL/PLATELET
Basophils Absolute: 0 10*3/uL (ref 0.0–0.1)
Basophils Relative: 0.3 % (ref 0.0–3.0)
Eosinophils Absolute: 0.1 10*3/uL (ref 0.0–0.7)
Eosinophils Relative: 1.7 % (ref 0.0–5.0)
HCT: 43.2 % (ref 36.0–46.0)
Hemoglobin: 14.9 g/dL (ref 12.0–15.0)
Lymphocytes Relative: 27.3 % (ref 12.0–46.0)
Lymphs Abs: 1.6 10*3/uL (ref 0.7–4.0)
MCHC: 34.5 g/dL (ref 30.0–36.0)
MCV: 85.1 fl (ref 78.0–100.0)
Monocytes Absolute: 0.6 10*3/uL (ref 0.1–1.0)
Monocytes Relative: 9.6 % (ref 3.0–12.0)
Neutro Abs: 3.6 10*3/uL (ref 1.4–7.7)
Neutrophils Relative %: 61.1 % (ref 43.0–77.0)
Platelets: 240 10*3/uL (ref 150.0–400.0)
RBC: 5.08 Mil/uL (ref 3.87–5.11)
RDW: 13.8 % (ref 11.5–15.5)
WBC: 5.9 10*3/uL (ref 4.0–10.5)

## 2014-10-13 NOTE — Telephone Encounter (Signed)
Encounter closed in error.

## 2014-10-13 NOTE — Telephone Encounter (Signed)
Pt needs labwork done for vitamin d refill - having other labs drawn today at [place] (dr brody placed orders for those) they requested pt call our office and request order in epic so [place] can see it for her vitamin d  Scheduled at [place] today for labs- dr brody  [phone]   Or [phone]

## 2014-10-13 NOTE — Telephone Encounter (Signed)
Okay to place order for Vitamin D at this time? Patient was last seen on 10/01/2014 with Regina Eck CNM for aex.

## 2014-10-13 NOTE — Telephone Encounter (Signed)
Spoke with patient. Advised order for Vitamin D check has been placed. Patient is agreeable.  Routing to provider for final review. Patient agreeable to disposition. Will close encounter

## 2014-10-13 NOTE — Telephone Encounter (Signed)
Ok to order 

## 2014-10-13 NOTE — Addendum Note (Signed)
Addended by: Rolla Etienne E on: 10/13/2014 11:23 AM   Modules accepted: Orders

## 2014-10-14 LAB — COMPREHENSIVE METABOLIC PANEL
ALT: 13 U/L (ref 0–35)
AST: 17 U/L (ref 0–37)
Albumin: 4 g/dL (ref 3.5–5.2)
Alkaline Phosphatase: 70 U/L (ref 39–117)
BUN: 16 mg/dL (ref 6–23)
CO2: 29 mEq/L (ref 19–32)
Calcium: 9.7 mg/dL (ref 8.4–10.5)
Chloride: 103 mEq/L (ref 96–112)
Creatinine, Ser: 0.74 mg/dL (ref 0.40–1.20)
GFR: 87.05 mL/min (ref >60.00)
Glucose, Bld: 90 mg/dL (ref 70–99)
Potassium: 4.1 mEq/L (ref 3.5–5.1)
Sodium: 137 mEq/L (ref 135–145)
Total Bilirubin: 0.3 mg/dL (ref 0.2–1.2)
Total Protein: 7.7 g/dL (ref 6.0–8.3)

## 2014-10-14 LAB — SEDIMENTATION RATE: Sed Rate: 22 mm/hr (ref 0–22)

## 2014-10-15 ENCOUNTER — Other Ambulatory Visit: Payer: Self-pay | Admitting: *Deleted

## 2014-10-15 ENCOUNTER — Ambulatory Visit
Admission: RE | Admit: 2014-10-15 | Discharge: 2014-10-15 | Disposition: A | Discharge status: Home / Self Care | Payer: BLUE CROSS/BLUE SHIELD | Source: Ambulatory Visit | Attending: Internal Medicine | Admitting: Internal Medicine

## 2014-10-15 ENCOUNTER — Other Ambulatory Visit (INDEPENDENT_AMBULATORY_CARE_PROVIDER_SITE_OTHER): Payer: BLUE CROSS/BLUE SHIELD

## 2014-10-15 DIAGNOSIS — M25511 Pain in right shoulder: Secondary | ICD-10-CM

## 2014-10-15 DIAGNOSIS — E559 Vitamin D deficiency, unspecified: Secondary | ICD-10-CM

## 2014-10-15 DIAGNOSIS — M898X1 Other specified disorders of bone, shoulder: Secondary | ICD-10-CM

## 2014-10-15 LAB — VITAMIN D 25 HYDROXY (VIT D DEFICIENCY, FRACTURES): VITD: 38.95 ng/mL (ref 30.00–100.00)

## 2014-10-15 MED ORDER — IOHEXOL 300 MG/ML  SOLN
75.0000 mL | Freq: Once | INTRAMUSCULAR | Status: AC | PRN
  Administered 2014-10-15: 75 mL via INTRAVENOUS

## 2014-10-16 ENCOUNTER — Telehealth: Payer: Self-pay | Admitting: *Deleted

## 2014-10-16 ENCOUNTER — Other Ambulatory Visit: Payer: Self-pay | Admitting: *Deleted

## 2014-10-16 DIAGNOSIS — M8580 Other specified disorders of bone density and structure, unspecified site: Secondary | ICD-10-CM

## 2014-10-16 MED ORDER — VITAMIN D (ERGOCALCIFEROL) 1.25 MG (50000 UNIT) PO CAPS: ORAL_CAPSULE | ORAL | Status: DC

## 2014-10-19 ENCOUNTER — Telehealth: Payer: Self-pay | Admitting: *Deleted

## 2014-10-19 NOTE — Telephone Encounter (Signed)
i sent her the whole scan on mychart---tell her it was normal!!

## 2014-10-19 NOTE — Telephone Encounter (Signed)
Pt called regarding her CT results that was done on 10/15/14. Pt was given results per Dr. Laney Pastor.

## 2014-11-04 NOTE — Telephone Encounter (Signed)
error 

## 2014-11-24 ENCOUNTER — Other Ambulatory Visit: Payer: Self-pay | Admitting: Internal Medicine

## 2014-11-24 NOTE — Telephone Encounter (Signed)
Sent Rx for potassium chlordie SA (K-DUR, KLOR-CON), 20 meq, #30 with 3 refills to [facility] on [address] in [place], [place] on 11/24/14.

## 2014-12-25 ENCOUNTER — Other Ambulatory Visit: Payer: Self-pay | Admitting: Internal Medicine

## 2014-12-28 NOTE — Telephone Encounter (Signed)
Sent Rx for Lialda, 1.2 g, #120 with 5 refills to [facility].

## 2014-12-31 ENCOUNTER — Ambulatory Visit: Payer: BLUE CROSS/BLUE SHIELD | Admitting: Urgent Care

## 2014-12-31 ENCOUNTER — Ambulatory Visit (INDEPENDENT_AMBULATORY_CARE_PROVIDER_SITE_OTHER): Payer: BLUE CROSS/BLUE SHIELD | Admitting: Emergency Medicine

## 2014-12-31 VITALS — BP 130/86 | HR 60 | Temp 98.4°F | Resp 20 | Ht 60.5 in | Wt 201.2 lb

## 2014-12-31 DIAGNOSIS — R300 Dysuria: Secondary | ICD-10-CM

## 2014-12-31 DIAGNOSIS — J0140 Acute pansinusitis, unspecified: Secondary | ICD-10-CM

## 2014-12-31 LAB — POCT UA - MICROSCOPIC ONLY
Bacteria, U Microscopic: NEGATIVE
Casts, Ur, LPF, POC: NEGATIVE
Crystals, Ur, HPF, POC: NEGATIVE
Epithelial cells, urine per micros: NEGATIVE
Mucus, UA: NEGATIVE
WBC, Ur, HPF, POC: NEGATIVE
Yeast, UA: NEGATIVE

## 2014-12-31 LAB — POCT URINALYSIS DIPSTICK
Bilirubin, UA: NEGATIVE
Glucose, UA: NEGATIVE
Ketones, UA: NEGATIVE
Leukocytes, UA: NEGATIVE
Nitrite, UA: NEGATIVE
Protein, UA: NEGATIVE
Spec Grav, UA: 1.01
Urobilinogen, UA: 0.2
pH, UA: 7

## 2014-12-31 MED ORDER — HYDROCOD POLST-CPM POLST ER 10-8 MG/5ML PO SUER: 5.0000 mL | Freq: Two times a day (BID) | ORAL | Status: DC

## 2014-12-31 MED ORDER — AMOXICILLIN-POT CLAVULANATE 875-125 MG PO TABS: 1.0000 | ORAL_TABLET | Freq: Two times a day (BID) | ORAL | Status: DC

## 2014-12-31 MED ORDER — TRIAMCINOLONE ACETONIDE 55 MCG/ACT NA AERO: 2.0000 | INHALATION_SPRAY | Freq: Every day | NASAL | Status: DC

## 2014-12-31 NOTE — Addendum Note (Signed)
Addended by: Roselee Culver on: 12/31/2014 05:36 PM   Modules accepted: Orders

## 2014-12-31 NOTE — Patient Instructions (Signed)

## 2014-12-31 NOTE — Addendum Note (Signed)
Addended by: Roselee Culver on: 12/31/2014 05:39 PM   Modules accepted: Orders

## 2014-12-31 NOTE — Progress Notes (Signed)
[facility] [address], [place] [address] [phone]  Date:  12/31/2014   Name:  Lauren [name]   DOB:  [birth year]   MRN:  [id number]  PCP:  Jenny Reichmann, MD    Chief Complaint: Urinary Tract Infection and Sinusitis   History of Present Illness:  Lauren [name] is a 54 y.o. very pleasant female patient who presents with the following:  Two complaints Has dysuria, urgency and frequency and foul smelling urine No GYN or GI symptoms. No fever or chills Long standing history of nasal congestion and mucopurulent drainage Has frontal pain and pressure Has purulent post nasal drainage No fever or chills Cough mostly non productive.  Worse at night in AM has mucopurulent sputum No wheezing or shortness of breath. No improvement with over the counter medications or other home remedies.  Denies other complaint or health concern today.   Patient Active Problem List   Diagnosis Date Noted  . Benign neoplasm of colon 07/29/2013  . Von Willebrand disease 07/17/2013  . Osteoporosis, unspecified 04/04/2013  . VITAMIN B12 DEFICIENCY 04/05/2010  . SINOATRIAL NODE DYSFUNCTION 10/15/2009  . Reactive depression 09/17/2009  . HEMORRHOIDS-EXTERNAL 05/18/2009  . Anxiety state 02/26/2008  . Essential hypertension 02/26/2008  . GERD 02/26/2008  . ULCERATIVE COLITIS 02/26/2008  . HOARSENESS 02/26/2008  . COUGH, CHRONIC 02/26/2008    Past Medical History  Diagnosis Date  . Ulcerative colitis   . SVT (supraventricular tachycardia)   . Personal history of meningioma of the brain   . Von Willebrands disease   . Anxiety and depression   . Vitamin B12 deficiency   . GERD (gastroesophageal reflux disease)   . Tennis elbow   . MRSA (methicillin resistant Staphylococcus aureus)   . Hypertension   . Osteopenia   . Clotting disorder   . Anemia   . Internal hemorrhoids   . Inflammatory polyps of colon     Past Surgical History  Procedure Laterality Date  .  Brain meningioma excision  05/2007  . Implantation bone anchored hearing aid    . Cardiac electrophysiology study and ablation  10/22/09  . Colonoscopy N/A 07/29/2013    Procedure: COLONOSCOPY;  Surgeon: Lafayette Dragon, MD;  Location: [facility];  Service: Endoscopy;  Laterality: N/A;  . Wisdom tooth extraction      History  Substance Use Topics  . Smoking status: Never Smoker   . Smokeless tobacco: Never Used  . Alcohol Use: No    Family History  Problem Relation Age of Onset  . Heart disease Maternal Grandfather   . Heart disease Mother   . Stroke Mother   . Diabetes Maternal Uncle   . Colon cancer Neg Hx     Allergies  Allergen Reactions  . Atenolol     Caused swelling  . Cephalexin     Upset her colitis    Medication list has been reviewed and updated.  Current Outpatient Prescriptions on File Prior to Visit  Medication Sig Dispense Refill  . FLUoxetine (PROZAC) 20 MG capsule Take 1 capsule (20 mg total) by mouth daily. 90 capsule 3  . LIALDA 1.2 G EC tablet TAKE 4 TABLETS BY MOUTH DAILY 120 tablet 5  . metoprolol succinate (TOPROL-XL) 50 MG 24 hr tablet Take 1 tablet (50 mg total) by mouth daily. 90 tablet 3  . Multiple Vitamin (MULTIVITAMIN) capsule Take 1 capsule by mouth daily.     . potassium chloride SA (K-DUR,KLOR-CON) 20 MEQ tablet  TAKE 1 TABLET BY MOUTH ONCE 30 tablet 3  . Vitamin D, Ergocalciferol, (DRISDOL) 50000 UNITS CAPS capsule Take one po weekly x 12 weeks 12 capsule 0   No current facility-administered medications on file prior to visit.    Review of Systems:  Review of Systems  Constitutional: Negative for fever, chills and fatigue.  HENT: Negative for congestion, ear pain, hearing loss, postnasal drip, rhinorrhea and sinus pressure.   Eyes: Negative for discharge and redness.  Respiratory: Negative for cough, shortness of breath and wheezing.   Cardiovascular: Negative for chest pain and leg swelling.  Gastrointestinal: Negative for nausea,  vomiting, abdominal pain, constipation and blood in stool.  Genitourinary: Negative for dysuria, urgency and frequency.  Musculoskeletal: Negative for neck stiffness.  Skin: Negative for rash.  Neurological: Negative for seizures, weakness and headaches.   Physical Examination: Filed Vitals:   12/31/14 1610  BP: 130/86  Pulse: 60  Temp: 98.4 F (36.9 C)  Resp: 20   Filed Vitals:   12/31/14 1610  Height: 5' 0.5" (1.537 m)  Weight: 201 lb 3 oz (91.258 kg)   Body mass index is 38.63 kg/(m^2). Ideal Body Weight: Weight in (lb) to have BMI = 25: 129.9  GEN: WDWN, NAD, Non-toxic, A & O x 3 HEENT: Atraumatic, Normocephalic. Neck supple. No masses, No LAD. Ears and Nose: No external deformity. CV: RRR, No M/G/R. No JVD. No thrill. No extra heart sounds. PULM: CTA B, no wheezes, crackles, rhonchi. No retractions. No resp. distress. No accessory muscle use. ABD: S, NT, ND, +BS. No rebound. No HSM. EXTR: No c/c/e NEURO Normal gait.  PSYCH: Normally interactive. Conversant. Not depressed or anxious appearing.  Calm demeanor.    Assessment and Plan: Sinusitis augmentin Coricidin HBP tussionex   Signed Ellison Carwin, MD   Results for orders placed or performed in visit on 12/31/14  POCT urinalysis dipstick  Result Value Ref Range   Color, UA yellow    Clarity, UA clear    Glucose, UA neg    Bilirubin, UA neg    Ketones, UA neg    Spec Grav, UA 1.010    Blood, UA small    pH, UA 7.0    Protein, UA neg    Urobilinogen, UA 0.2    Nitrite, UA neg    Leukocytes, UA Negative   POCT UA - Microscopic Only  Result Value Ref Range   WBC, Ur, HPF, POC neg    RBC, urine, microscopic 0-1    Bacteria, U Microscopic neg    Mucus, UA neg    Epithelial cells, urine per micros neg    Crystals, Ur, HPF, POC neg    Casts, Ur, LPF, POC neg    Yeast, UA neg

## 2015-01-01 LAB — URINE CULTURE: Colony Count: 50000

## 2015-02-23 ENCOUNTER — Other Ambulatory Visit: Payer: Self-pay | Admitting: Internal Medicine

## 2015-03-31 ENCOUNTER — Other Ambulatory Visit: Payer: Self-pay | Admitting: Internal Medicine

## 2015-06-01 ENCOUNTER — Encounter: Payer: Self-pay | Admitting: Emergency Medicine

## 2015-06-03 ENCOUNTER — Telehealth: Payer: Self-pay

## 2015-06-03 NOTE — Telephone Encounter (Signed)
Received rx refill for potassium 20MEQ. Called pt and left vm for pt to call back

## 2015-06-17 ENCOUNTER — Ambulatory Visit (INDEPENDENT_AMBULATORY_CARE_PROVIDER_SITE_OTHER): Payer: BLUE CROSS/BLUE SHIELD | Admitting: Physician Assistant

## 2015-06-17 VITALS — BP 118/74 | HR 65 | Temp 98.8°F | Resp 18 | Ht 61.0 in | Wt 201.4 lb

## 2015-06-17 DIAGNOSIS — M8580 Other specified disorders of bone density and structure, unspecified site: Secondary | ICD-10-CM | POA: Diagnosis not present

## 2015-06-17 DIAGNOSIS — Z1159 Encounter for screening for other viral diseases: Secondary | ICD-10-CM

## 2015-06-17 DIAGNOSIS — Z114 Encounter for screening for human immunodeficiency virus [HIV]: Secondary | ICD-10-CM | POA: Diagnosis not present

## 2015-06-17 DIAGNOSIS — Z13220 Encounter for screening for lipoid disorders: Secondary | ICD-10-CM

## 2015-06-17 DIAGNOSIS — Z13228 Encounter for screening for other metabolic disorders: Secondary | ICD-10-CM

## 2015-06-17 DIAGNOSIS — Z0000 Encounter for general adult medical examination without abnormal findings: Secondary | ICD-10-CM

## 2015-06-17 DIAGNOSIS — Z130 Encounter for screening for diseases of the blood and blood-forming organs and certain disorders involving the immune mechanism: Secondary | ICD-10-CM

## 2015-06-17 DIAGNOSIS — R05 Cough: Secondary | ICD-10-CM | POA: Diagnosis not present

## 2015-06-17 DIAGNOSIS — Z1329 Encounter for screening for other suspected endocrine disorder: Secondary | ICD-10-CM | POA: Diagnosis not present

## 2015-06-17 DIAGNOSIS — R059 Cough, unspecified: Secondary | ICD-10-CM

## 2015-06-17 NOTE — Progress Notes (Signed)
Patient ID: Lauren [name], female    DOB: October 19, 1960, 54 y.o.   MRN: [id number]  PCP: Jenny Reichmann, MD  Chief Complaint  Patient presents with  . Annual Exam    CPE without pap  . Medication Refill    potassium and vitamin D    Subjective:   HPI: Presents for annual wellness exam. Breast and cervical cancer screening with GYN, is current. Mammogram 07/2014 (she desires to continue this annually). Last pap was 2016. Due in 2019.  DEXA x 2. Osteopenia in LEFT hip. Out of Vitamin D x 1 month. No supplemental calcium.  Colonoscopy is current.  Declines flu vaccine. "I don't get them."   Patient Active Problem List   Diagnosis Date Noted  . Benign neoplasm of colon 07/29/2013  . Von Willebrand disease ([place]) 07/17/2013  . Osteoporosis, unspecified 04/04/2013  . VITAMIN B12 DEFICIENCY 04/05/2010  . SINOATRIAL NODE DYSFUNCTION 10/15/2009  . Reactive depression 09/17/2009  . HEMORRHOIDS-EXTERNAL 05/18/2009  . Anxiety state 02/26/2008  . Essential hypertension 02/26/2008  . GERD 02/26/2008  . ULCERATIVE COLITIS 02/26/2008  . HOARSENESS 02/26/2008  . COUGH, CHRONIC 02/26/2008    Past Medical History  Diagnosis Date  . Ulcerative colitis   . SVT (supraventricular tachycardia) ([place])   . Personal history of meningioma of the brain   . Von Willebrands disease ([place])   . Anxiety and depression   . Vitamin B12 deficiency   . GERD (gastroesophageal reflux disease)   . Tennis elbow   . MRSA (methicillin resistant Staphylococcus aureus)   . Hypertension   . Osteopenia   . Clotting disorder ([place])   . Anemia   . Internal hemorrhoids   . Inflammatory polyps of colon ([place])      Prior to Admission medications   Medication Sig Start Date End Date Taking? Authorizing Provider  FLUoxetine (PROZAC) 20 MG capsule Take 1 capsule (20 mg total) by mouth daily. 10/03/14  Yes Leandrew Koyanagi, MD  LIALDA 1.2 G EC tablet TAKE 4 TABLETS BY MOUTH DAILY 12/28/14  Yes Lafayette Dragon, MD    metoprolol succinate (TOPROL-XL) 50 MG 24 hr tablet Take 1 tablet (50 mg total) by mouth daily. 10/03/14  Yes Leandrew Koyanagi, MD  Multiple Vitamin (MULTIVITAMIN) capsule Take 1 capsule by mouth daily.    Yes Historical Provider, MD  potassium chloride SA (K-DUR,KLOR-CON) 20 MEQ tablet TAKE 1 TABLET BY MOUTH EVERY DAY 04/01/15  Yes Lafayette Dragon, MD  triamcinolone (NASACORT AQ) 55 MCG/ACT AERO nasal inhaler Place 2 sprays into the nose daily. 12/31/14  Yes Roselee Culver, MD  triamcinolone (NASACORT AQ) 55 MCG/ACT AERO nasal inhaler Place 2 sprays into the nose daily. 12/31/14  Yes Roselee Culver, MD  Vitamin D, Ergocalciferol, (DRISDOL) 50000 UNITS CAPS capsule TAKE ONE CAPSULE BY MOUTH ONCE A WEEK FOR 12 WEEKS 02/23/15  Yes Lafayette Dragon, MD    Allergies  Allergen Reactions  . Atenolol     Caused swelling  . Cephalexin     Upset her colitis    Past Surgical History  Procedure Laterality Date  . Brain meningioma excision  05/2007  . Implantation bone anchored hearing aid    . Cardiac electrophysiology study and ablation  10/22/09  . Colonoscopy N/A 07/29/2013    Procedure: COLONOSCOPY;  Surgeon: Lafayette Dragon, MD;  Location: [facility];  Service: Endoscopy;  Laterality: N/A;  . Wisdom tooth extraction      Family History  Problem Relation  Age of Onset  . Heart disease Maternal Grandfather   . Heart disease Mother   . Stroke Mother   . Diabetes Maternal Uncle   . Colon cancer Neg Hx     Social History   Social History  . Marital Status: Married    Spouse Name: N/A  . Number of Children: 1  . Years of Education: N/A   Occupational History  . Production assistant, radio   . ADM ASSISTANT    Social History Main Topics  . Smoking status: Never Smoker   . Smokeless tobacco: Never Used  . Alcohol Use: No  . Drug Use: No  . Sexual Activity: No   Other Topics Concern  . None   Social History Narrative       Review of Systems  Constitutional: Negative.    HENT: Positive for postnasal drip. Negative for congestion, sinus pressure, sneezing, sore throat, tinnitus, trouble swallowing and voice change.   Eyes: Negative for pain and visual disturbance.  Respiratory: Positive for cough. Negative for choking, chest tightness, shortness of breath, wheezing and stridor.   Cardiovascular: Negative.   Gastrointestinal: Negative for nausea, vomiting, abdominal pain, diarrhea, constipation, blood in stool and rectal pain.  Endocrine: Negative.   Genitourinary: Negative.   Musculoskeletal: Negative.   Skin: Negative.   Allergic/Immunologic: Negative.   Neurological: Negative.   Hematological: Negative.   Psychiatric/Behavioral: Negative.         Objective:  Physical Exam  Constitutional: She is oriented to person, place, and time. Vital signs are normal. She appears well-developed and well-nourished. She is active and cooperative. No distress.  BP 118/74 mmHg  Pulse 65  Temp(Src) 98.8 F (37.1 C) (Oral)  Resp 18  Ht 5\' 1"  (1.549 m)  Wt 201 lb 6.4 oz (91.354 kg)  BMI 38.07 kg/m2  SpO2 94%  LMP 11/26/2008   HENT:  Head: Normocephalic and atraumatic.  Right Ear: Hearing, tympanic membrane, external ear and ear canal normal. No foreign bodies.  Left Ear: Hearing, tympanic membrane, external ear and ear canal normal. No foreign bodies.  Nose: Nose normal.  Mouth/Throat: Uvula is midline, oropharynx is clear and moist and mucous membranes are normal. No oral lesions. Normal dentition. No dental abscesses or uvula swelling. No oropharyngeal exudate.  Eyes: Conjunctivae, EOM and lids are normal. Pupils are equal, round, and reactive to light. Right eye exhibits no discharge. Left eye exhibits no discharge. No scleral icterus.  Fundoscopic exam:      The right eye shows no arteriolar narrowing, no AV nicking, no exudate, no hemorrhage and no papilledema. The right eye shows red reflex.       The left eye shows no arteriolar narrowing, no AV  nicking, no exudate, no hemorrhage and no papilledema. The left eye shows red reflex.  Neck: Trachea normal, normal range of motion and full passive range of motion without pain. Neck supple. No spinous process tenderness and no muscular tenderness present. No thyroid mass and no thyromegaly present.  Cardiovascular: Normal rate, regular rhythm, normal heart sounds, intact distal pulses and normal pulses.   Pulmonary/Chest: Effort normal and breath sounds normal. Right breast exhibits no inverted nipple, no mass, no nipple discharge, no skin change and no tenderness. Left breast exhibits no inverted nipple, no mass, no nipple discharge, no skin change and no tenderness. Breasts are symmetrical.  Musculoskeletal: She exhibits no edema or tenderness.       Cervical back: Normal.       Thoracic back: Normal.  Lumbar back: Normal.  Lymphadenopathy:       Head (right side): No tonsillar, no preauricular, no posterior auricular and no occipital adenopathy present.       Head (left side): No tonsillar, no preauricular, no posterior auricular and no occipital adenopathy present.    She has no cervical adenopathy.       Right: No supraclavicular adenopathy present.       Left: No supraclavicular adenopathy present.  Neurological: She is alert and oriented to person, place, and time. She has normal strength and normal reflexes. No cranial nerve deficit. She exhibits normal muscle tone. Coordination and gait normal.  Skin: Skin is warm, dry and intact. No rash noted. She is not diaphoretic. No cyanosis or erythema. Nails show no clubbing.  Psychiatric: She has a normal mood and affect. Her speech is normal and behavior is normal. Judgment and thought content normal.           Assessment & Plan:  1. Annual physical exam Age appropriate anticipatory guidance provided.  2. Screening for HIV (human immunodeficiency virus) - HIV antibody  3. Need for hepatitis C screening test - Hepatitis C  antibody  4. Osteopenia Update DEXA. COntinue vitamin D supplementation, either prescription or OTC dose. Add Calcium supplementation. - DG Bone Density; Future - Vit D  25 hydroxy (rtn osteoporosis monitoring)  5. COUGH, CHRONIC May be allergy. Evaluated for reflux, and not found to have it. Continue steroid nasal spray. Add OTC oral antihistamine for 1 month trial. If symptoms persist, add Singulair.  6. Screening for thyroid disorder - TSH  7. Screening for deficiency anemia - CBC with Differential/Platelet  8. Screening for metabolic disorder - Comprehensive metabolic panel  9. Screening for hyperlipidemia - Lipid panel   Fara Chute, PA-C Physician Assistant-Certified [facility] [place]

## 2015-06-17 NOTE — Patient Instructions (Addendum)
I will contact you with your lab results as soon as they are available.   If you have not heard from me in 2 weeks, please contact me.  The fastest way to get your results is to register for My Chart (see the instructions on the last page of this printout).  Get an OTC oral antihistamine, like Allegra, Claritin or Zyrtec. Try it daily for one month. If your cough and drainage persist, let me know. We will add Singulair.  Keeping You Healthy  Get These Tests  Blood Pressure- Have your blood pressure checked by your healthcare provider at least once a year.  Normal blood pressure is 120/80.  Weight- Have your body mass index (BMI) calculated to screen for obesity.  BMI is a measure of body fat based on height and weight.  You can calculate your own BMI at [employer]velB[employer].it  Cholesterol- Have your cholesterol checked every year.  Diabetes- Have your blood sugar checked every year if you have high blood pressure, high cholesterol, a family history of diabetes or if you are overweight.  Pap Test - Have a pap test every 1 to 5 (08/2017) years if you have been sexually active.  If you are older than 65 and recent pap tests have been normal you may not need additional pap tests.  In addition, if you have had a hysterectomy  for benign disease additional pap tests are not necessary.  Mammogram-Yearly mammograms are essential for early detection of breast cancer (December)  Screening for Colon Cancer- Colonoscopy starting at age 55. Screening may begin sooner depending on your family history and other health conditions.  Follow up colonoscopy as directed by your Gastroenterologist.  Screening for Osteoporosis- Screening begins at age 36 with bone density scanning, sooner if you are at higher risk for developing Osteoporosis.  Get these medicines  Calcium with Vitamin D- Your body requires 1200-1500 mg of Calcium a day (I like VIACTV) and [phone] IU of Vitamin D a day.  You can only absorb  500 mg of Calcium at a time therefore Calcium must be taken in 2 or 3 separate doses throughout the day.  Hormones- Hormone therapy has been associated with increased risk for certain cancers and heart disease.  Talk to your healthcare provider about if you need relief from menopausal symptoms.  Aspirin- Ask your healthcare provider about taking Aspirin to prevent Heart Disease and Stroke.  Get these Immuniztions  Flu shot- Every fall  Pneumonia shot- Once after the age of 52; if you are younger ask your healthcare provider if you need a pneumonia shot.  Tetanus- Every ten years.  Zostavax- Once after the age of 45 to prevent shingles.  Take these steps  Don't smoke- Your healthcare provider can help you quit. For tips on how to quit, ask your healthcare provider or go to www.smokefree.gov or call 1-800 QUIT-NOW.  Be physically active- Exercise 5 days a week for a minimum of 30 minutes.  If you are not already physically active, start slow and gradually work up to 30 minutes of moderate physical activity.  Try walking, dancing, bike riding, swimming, etc.  Eat a healthy diet- Eat a variety of healthy foods such as fruits, vegetables, whole grains, low fat milk, low fat cheeses, yogurt, lean meats, chicken, fish, eggs, dried beans, tofu, etc.  For more information go to www.thenutritionsource.org  Dental visit- Brush and floss teeth twice daily; visit your dentist twice a year.  Eye exam- Visit your Optometrist or Ophthalmologist yearly.  Drink alcohol in moderation- Limit alcohol intake to one drink or less a day.  Never drink and drive.  Depression- Your emotional health is as important as your physical health.  If you're feeling down or losing interest in things you normally enjoy, please talk to your healthcare provider.  Seat Belts- can save your life; always wear one  Smoke/Carbon Monoxide detectors- These detectors need to be installed on the appropriate level of your home.   Replace batteries at least once a year.  Violence- If anyone is threatening or hurting you, please tell your healthcare provider.  Living Will/ Health care power of attorney- Discuss with your healthcare provider and family.

## 2015-06-18 LAB — CBC WITH DIFFERENTIAL/PLATELET
Basophils Absolute: 0 10*3/uL (ref 0.0–0.1)
Basophils Relative: 0 % (ref 0–1)
Eosinophils Absolute: 0.1 10*3/uL (ref 0.0–0.7)
Eosinophils Relative: 1 % (ref 0–5)
HCT: 44 % (ref 36.0–46.0)
Hemoglobin: 14.9 g/dL (ref 12.0–15.0)
Lymphocytes Relative: 25 % (ref 12–46)
Lymphs Abs: 1.4 10*3/uL (ref 0.7–4.0)
MCH: 29.2 pg (ref 26.0–34.0)
MCHC: 33.9 g/dL (ref 30.0–36.0)
MCV: 86.3 fL (ref 78.0–100.0)
MPV: 9.8 fL (ref 8.6–12.4)
Monocytes Absolute: 0.3 10*3/uL (ref 0.1–1.0)
Monocytes Relative: 6 % (ref 3–12)
Neutro Abs: 3.9 10*3/uL (ref 1.7–7.7)
Neutrophils Relative %: 68 % (ref 43–77)
Platelets: 234 10*3/uL (ref 150–400)
RBC: 5.1 MIL/uL (ref 3.87–5.11)
RDW: 14.2 % (ref 11.5–15.5)
WBC: 5.7 10*3/uL (ref 4.0–10.5)

## 2015-06-18 LAB — COMPREHENSIVE METABOLIC PANEL
ALT: 12 U/L (ref 6–29)
AST: 18 U/L (ref 10–35)
Albumin: 4 g/dL (ref 3.6–5.1)
Alkaline Phosphatase: 73 U/L (ref 33–130)
BUN: 12 mg/dL (ref 7–25)
CO2: 27 mmol/L (ref 20–31)
Calcium: 8.8 mg/dL (ref 8.6–10.4)
Chloride: 101 mmol/L (ref 98–110)
Creat: 0.54 mg/dL (ref 0.50–1.05)
Glucose, Bld: 83 mg/dL (ref 65–99)
Potassium: 3.6 mmol/L (ref 3.5–5.3)
Sodium: 139 mmol/L (ref 135–146)
Total Bilirubin: 0.6 mg/dL (ref 0.2–1.2)
Total Protein: 7.1 g/dL (ref 6.1–8.1)

## 2015-06-18 LAB — LIPID PANEL
Cholesterol: 210 mg/dL — ABNORMAL HIGH (ref 125–200)
HDL: 47 mg/dL (ref >=46)
LDL Cholesterol: 132 mg/dL — ABNORMAL HIGH (ref <130)
Total CHOL/HDL Ratio: 4.5 Ratio (ref <=5.0)
Triglycerides: 153 mg/dL — ABNORMAL HIGH (ref <150)
VLDL: 31 mg/dL — ABNORMAL HIGH (ref <30)

## 2015-06-18 LAB — HEPATITIS C ANTIBODY: HCV Ab: NEGATIVE

## 2015-06-18 LAB — VITAMIN D 25 HYDROXY (VIT D DEFICIENCY, FRACTURES): Vit D, 25-Hydroxy: 23 ng/mL — ABNORMAL LOW (ref 30–100)

## 2015-06-18 LAB — TSH: TSH: 1.74 u[IU]/mL (ref 0.350–4.500)

## 2015-06-18 LAB — HIV ANTIBODY (ROUTINE TESTING W REFLEX): HIV 1&2 Ab, 4th Generation: NONREACTIVE

## 2015-06-18 MED ORDER — ATORVASTATIN CALCIUM 20 MG PO TABS: 20.0000 mg | ORAL_TABLET | Freq: Every day | ORAL | Status: DC

## 2015-06-18 NOTE — Addendum Note (Signed)
Addended by: Fara Chute on: 06/18/2015 09:52 AM   Modules accepted: Orders

## 2015-06-22 ENCOUNTER — Other Ambulatory Visit: Payer: Self-pay

## 2015-06-22 DIAGNOSIS — E2839 Other primary ovarian failure: Secondary | ICD-10-CM

## 2015-07-05 ENCOUNTER — Ambulatory Visit (INDEPENDENT_AMBULATORY_CARE_PROVIDER_SITE_OTHER): Payer: BLUE CROSS/BLUE SHIELD | Admitting: Family Medicine

## 2015-07-05 VITALS — BP 142/86 | HR 57 | Temp 98.0°F | Resp 16 | Ht 61.0 in | Wt 201.0 lb

## 2015-07-05 DIAGNOSIS — S161XXA Strain of muscle, fascia and tendon at neck level, initial encounter: Secondary | ICD-10-CM | POA: Diagnosis not present

## 2015-07-05 DIAGNOSIS — K5190 Ulcerative colitis, unspecified, without complications: Secondary | ICD-10-CM | POA: Diagnosis not present

## 2015-07-05 DIAGNOSIS — Z87898 Personal history of other specified conditions: Secondary | ICD-10-CM | POA: Diagnosis not present

## 2015-07-05 DIAGNOSIS — E878 Other disorders of electrolyte and fluid balance, not elsewhere classified: Secondary | ICD-10-CM | POA: Diagnosis not present

## 2015-07-05 MED ORDER — MESALAMINE 1.2 G PO TBEC: 4.8000 g | DELAYED_RELEASE_TABLET | Freq: Every day | ORAL | Status: DC

## 2015-07-05 NOTE — Progress Notes (Signed)
[facility] [address] [place] [address] [phone]  Date:  07/05/2015   Name:  TAMSYN OWUSU   DOB:  05-23-1961   MRN:  [id number]  PCP:  Jenny Reichmann, MD    Chief Complaint: Joint pain; Tachycardia; and Medication Refill   History of Present Illness:  Lauren [name] is a 54 y.o. very pleasant female patient who presents with the following:  Here today with concern of rechecking her potassium- her recent K level was normal, but she had not taken her K in about 3 days prior. She has stopped taking her K at this time; wants to know if she still needs to take this.  She has now been off it for at least 2-3 weeks  She had a cardiac ablation a few years ago (about 6 years ago) for SA node dysfunction- SVT.  However she has noted a feeling "like my heart was going to race but it isn't racing." this has come and gone over the last few days. She indicates that she feels this at the base of her neck.    She is not having any CP or SOB- except for a few days agot she noted a "feeling of a pull" in her chest when she would cough only.  She also noted a feeling of a "strained muscle" in her neck only when she would cough for a few days.    She has not noted any problems with being active- she is working at [employer] and has not had any trouble  He did see Dr. Daneen Schick for cardiology and Dr. Caryl Comes for her ablation but has not needed to follow up in a while  She also needs a RF of her Lialda that she takes for her UC. This had been rx by Dr. Olevia Perches but she is now retired.  She will have a new GI doctor soon   Patient Active Problem List   Diagnosis Date Noted  . Benign neoplasm of colon 07/29/2013  . Von Willebrand disease ([place]) 07/17/2013  . Osteoporosis, unspecified 04/04/2013  . VITAMIN B12 DEFICIENCY 04/05/2010  . SINOATRIAL NODE DYSFUNCTION 10/15/2009  . Reactive depression 09/17/2009  . HEMORRHOIDS-EXTERNAL 05/18/2009  . Anxiety state 02/26/2008  .  Essential hypertension 02/26/2008  . ULCERATIVE COLITIS 02/26/2008  . COUGH, CHRONIC 02/26/2008    Past Medical History  Diagnosis Date  . Ulcerative colitis   . SVT (supraventricular tachycardia) ([place])   . Personal history of meningioma of the brain   . Von Willebrands disease ([place])   . Anxiety and depression   . Vitamin B12 deficiency   . GERD (gastroesophageal reflux disease)   . Tennis elbow   . MRSA (methicillin resistant Staphylococcus aureus)   . Hypertension   . Osteopenia   . Clotting disorder ([place])   . Anemia   . Internal hemorrhoids   . Inflammatory polyps of colon [facility])     Past Surgical History  Procedure Laterality Date  . Brain meningioma excision  05/2007  . Implantation bone anchored hearing aid    . Cardiac electrophysiology study and ablation  10/22/09  . Colonoscopy N/A 07/29/2013    Procedure: COLONOSCOPY;  Surgeon: Lafayette Dragon, MD;  Location: [facility] ENDOSCOPY;  Service: Endoscopy;  Laterality: N/A;  . Wisdom tooth extraction      Social History  Substance Use Topics  . Smoking status: Never Smoker   . Smokeless tobacco: Never Used  . Alcohol Use: No  Family History  Problem Relation Age of Onset  . Heart disease Maternal Grandfather   . Heart disease Mother   . Stroke Mother   . Diabetes Maternal Uncle   . Colon cancer Neg Hx     Allergies  Allergen Reactions  . Atenolol     Caused swelling  . Cephalexin     Upset her colitis    Medication list has been reviewed and updated.  Current Outpatient Prescriptions on File Prior to Visit  Medication Sig Dispense Refill  . FLUoxetine (PROZAC) 20 MG capsule Take 1 capsule (20 mg total) by mouth daily. 90 capsule 3  . LIALDA 1.2 G EC tablet TAKE 4 TABLETS BY MOUTH DAILY 120 tablet 5  . metoprolol succinate (TOPROL-XL) 50 MG 24 hr tablet Take 1 tablet (50 mg total) by mouth daily. 90 tablet 3  . Multiple Vitamin (MULTIVITAMIN) capsule Take 1 capsule by mouth daily.     Marland Kitchen triamcinolone  (NASACORT AQ) 55 MCG/ACT AERO nasal inhaler Place 2 sprays into the nose daily. 1 Inhaler 12  . triamcinolone (NASACORT AQ) 55 MCG/ACT AERO nasal inhaler Place 2 sprays into the nose daily. 1 Inhaler 12  . Vitamin D, Ergocalciferol, (DRISDOL) 50000 UNITS CAPS capsule TAKE ONE CAPSULE BY MOUTH ONCE A WEEK FOR 12 WEEKS 12 capsule 0  . atorvastatin (LIPITOR) 20 MG tablet Take 1 tablet (20 mg total) by mouth daily. (Patient not taking: Reported on 07/05/2015) 90 tablet 3  . potassium chloride SA (K-DUR,KLOR-CON) 20 MEQ tablet TAKE 1 TABLET BY MOUTH EVERY DAY (Patient not taking: Reported on 07/05/2015) 30 tablet 0   No current facility-administered medications on file prior to visit.    Review of Systems:  As per HPI- otherwise negative.   Physical Examination: Filed Vitals:   07/05/15 0928  BP: 142/86  Pulse: 57  Temp:   Resp:    Filed Vitals:   07/05/15 0909  Height: 5' 1"  (1.549 m)  Weight: 201 lb (91.173 kg)   Body mass index is 38 kg/(m^2). Ideal Body Weight: Weight in (lb) to have BMI = 25: 132  GEN: WDWN, NAD, Non-toxic, A & O x 3, overweight, looks well HEENT: Atraumatic, Normocephalic. Neck supple. No masses, No LAD. Able to reproduce tenderness in the right sternocleidomastoid muscle Ears and Nose: No external deformity. CV: RRR, No M/G/R. No JVD. No thrill. No extra heart sounds. PULM: CTA B, no wheezes, crackles, rhonchi. No retractions. No resp. distress. No accessory muscle use. ABD: S, NT, ND EXTR: No c/c/e NEURO Normal gait.  PSYCH: Normally interactive. Conversant. Not depressed or anxious appearing.  Calm demeanor.   EKG:  SR with rate of 56 BPM No ST elevation or depression Assessment and Plan: Potassium disorder - Plan: Basic metabolic panel  History of tachycardia - Plan: EKG 12-Lead  Neck strain, initial encounter  Ulcerative colitis without complications, unspecified location ([place]) - Plan: mesalamine (LIALDA) 1.2 G EC tablet  She would like to  check on her K level today- if low or borderline we can certainly have her go back on potassium.  This was her main concern today.  She is not having any current CP- her recent chest discomfort/ neck discomfort with walking seems to be MSK in origin. However she will let me know if this returns or changes She will let us know if any other concerns or persistent sx regarding her feeling of tachycardia- no evidence of SVT on her EKG today Refilled her lialda today  Signed Janett Billow  Cop[place], MD

## 2015-07-05 NOTE — Patient Instructions (Signed)
I will be in touch with your potassium results asap Your EKG is reassuring today- if you develop any other concerning symptoms please let me know   We can restart potassium if need be- if you are borderline or low.  I can place a lab order to have your K checked periodically as a lab visit only

## 2015-07-06 LAB — BASIC METABOLIC PANEL
BUN: 9 mg/dL (ref 7–25)
CO2: 30 mmol/L (ref 20–31)
Calcium: 8.9 mg/dL (ref 8.6–10.4)
Chloride: 102 mmol/L (ref 98–110)
Creat: 0.62 mg/dL (ref 0.50–1.05)
Glucose, Bld: 88 mg/dL (ref 65–99)
Potassium: 3.5 mmol/L (ref 3.5–5.3)
Sodium: 142 mmol/L (ref 135–146)

## 2015-07-06 MED ORDER — POTASSIUM CHLORIDE CRYS ER 20 MEQ PO TBCR
EXTENDED_RELEASE_TABLET | ORAL | Status: DC
Start: 2015-07-06 — End: 2016-06-02

## 2015-07-06 NOTE — Addendum Note (Signed)
Addended by: Lamar Blinks C on: 07/06/2015 08:43 AM   Modules accepted: Orders

## 2015-07-30 ENCOUNTER — Other Ambulatory Visit: Payer: Self-pay

## 2015-07-30 DIAGNOSIS — Z1231 Encounter for screening mammogram for malignant neoplasm of breast: Secondary | ICD-10-CM

## 2015-09-03 ENCOUNTER — Ambulatory Visit
Admission: RE | Admit: 2015-09-03 | Discharge: 2015-09-03 | Disposition: A | Discharge status: Home / Self Care | Payer: 59 | Source: Ambulatory Visit | Attending: Physician Assistant | Admitting: Physician Assistant

## 2015-09-03 ENCOUNTER — Ambulatory Visit
Admission: RE | Admit: 2015-09-03 | Discharge: 2015-09-03 | Disposition: A | Discharge status: Home / Self Care | Payer: 59 | Source: Ambulatory Visit

## 2015-09-03 DIAGNOSIS — E2839 Other primary ovarian failure: Secondary | ICD-10-CM

## 2015-09-03 DIAGNOSIS — Z1231 Encounter for screening mammogram for malignant neoplasm of breast: Secondary | ICD-10-CM

## 2015-09-07 ENCOUNTER — Other Ambulatory Visit: Payer: Self-pay | Admitting: Physician Assistant

## 2015-09-07 DIAGNOSIS — M810 Age-related osteoporosis without current pathological fracture: Secondary | ICD-10-CM

## 2015-09-07 MED ORDER — ALENDRONATE SODIUM 70 MG PO TABS: 70.0000 mg | ORAL_TABLET | ORAL | Status: DC

## 2015-09-07 NOTE — Progress Notes (Signed)
Progression from osteopenia to osteoporosis on follow-up DEXA. Continue vitamin D and calcium. Add Fosamax 70 mg Q week.  Meds ordered this encounter  Medications  . alendronate (FOSAMAX) 70 MG tablet    Sig: Take 1 tablet (70 mg total) by mouth every 7 (seven) days. Take with a full glass of water on an empty stomach.    Dispense:  12 tablet    Refill:  3    Order Specific Question:  Supervising Provider    Answer:  DOOLITTLE, ROBERT P [id number]

## 2015-09-10 ENCOUNTER — Ambulatory Visit (INDEPENDENT_AMBULATORY_CARE_PROVIDER_SITE_OTHER): Payer: 59 | Admitting: Gastroenterology

## 2015-09-10 ENCOUNTER — Encounter: Payer: Self-pay | Admitting: Gastroenterology

## 2015-09-10 VITALS — BP 120/76 | HR 72 | Ht 59.0 in | Wt 202.2 lb

## 2015-09-10 DIAGNOSIS — K5150 Left sided colitis without complications: Secondary | ICD-10-CM | POA: Diagnosis not present

## 2015-09-10 DIAGNOSIS — D6800 Von Willebrand disease, unspecified: Secondary | ICD-10-CM

## 2015-09-10 NOTE — Patient Instructions (Signed)
You have been scheduled for a colonoscopy. Please follow written instructions given to you at your visit today.  Please pick up your prep supplies at the pharmacy within the next 1-3 days. If you use inhalers (even only as needed), please bring them with you on the day of your procedure. Your physician has requested that you go to www.startemmi.com and enter the access code given to you at your visit today. This web site gives a general overview about your procedure. However, you should still follow specific instructions given to you by our office regarding your preparation for the procedure.  We will contact you Hematologist regarding a follow up.

## 2015-09-10 NOTE — Progress Notes (Signed)
HPI :  55 y/o female, new patient to me, here for follow up for UC. She has a history of what appears to be left sided ulcerative colitis, diagnosed around 14-15 years ago from what she can recall. She has been on Remicade in the past for her colitis, which she thinks she did well for a few years. She had a severe MRSA infection of her ear and ultimately Remicade was held and eventually was stopped given she had done well when it was held. She has been on Imuran in the past, she thinks she tolerated it well, she thinks it was weaned off given she had been feeling well also. She has never been on Humira or other anti-TNF agents. She has never been on methotrexate. She has been on Lialda for a long time, prior to that Asacol. She is taking 4.8gm Lialda for a long time. She reports it works well and she generally has been feeling well. She has roughly 1-2 Bms per day or so. No blood in the stools. Stools are normally loose or soft. No abdominal pains. She does not have urgency for the most part, sometimes she does. She has never been hospitalized for her colitis in the past. She has not had many flares recently, but had a severe flare several years ago for which she was on steroids for a period of time.  She denies NSAIDs. She has Von Willebrands. She bleeds easily but has never had significant bleeding / hemorrhages in the past. She has had DDAVP infusions prior to her last colonoscopy or Stilmate nose spray.    She is not aware of much of her family history in regards to [facility] or IBD.  Colonoscopy 2014 - active rectal inflammation, inflammatory polyp   Past Medical History  Diagnosis Date  . Ulcerative colitis   . SVT (supraventricular tachycardia) ([place])   . Personal history of meningioma of the brain   . Von Willebrands disease ([place])   . Anxiety and depression   . Vitamin B12 deficiency   . GERD (gastroesophageal reflux disease)   . Tennis elbow   . MRSA (methicillin resistant Staphylococcus  aureus)   . Hypertension   . Osteopenia   . Clotting disorder ([place])   . Anemia   . Internal hemorrhoids   . Inflammatory polyps of colon [facility])      Past Surgical History  Procedure Laterality Date  . Brain meningioma excision  05/2007  . Implantation bone anchored hearing aid    . Cardiac electrophysiology study and ablation  10/22/09  . Colonoscopy N/A 07/29/2013    Procedure: COLONOSCOPY;  Surgeon: Lafayette Dragon, MD;  Location: [facility];  Service: Endoscopy;  Laterality: N/A;  . Wisdom tooth extraction     Family History  Problem Relation Age of Onset  . Heart disease Maternal Grandfather   . Heart disease Mother   . Stroke Mother   . Diabetes Maternal Uncle   . Colon cancer Neg Hx    Social History  Substance Use Topics  . Smoking status: Never Smoker   . Smokeless tobacco: Never Used  . Alcohol Use: No   Current Outpatient Prescriptions  Medication Sig Dispense Refill  . fexofenadine-pseudoephedrine (ALLEGRA-D 24) 180-240 MG 24 hr tablet Take 1 tablet by mouth daily.    Marland Kitchen FLUoxetine (PROZAC) 20 MG capsule Take 1 capsule (20 mg total) by mouth daily. 90 capsule 3  . mesalamine (LIALDA) 1.2 G EC tablet Take 4 tablets (4.8 g total) by  mouth daily. 120 tablet 11  . metoprolol succinate (TOPROL-XL) 50 MG 24 hr tablet Take 1 tablet (50 mg total) by mouth daily. 90 tablet 3  . Multiple Vitamin (MULTIVITAMIN) capsule Take 1 capsule by mouth daily.     . potassium chloride SA (K-DUR,KLOR-CON) 20 MEQ tablet Take one by mouth every 1-2 days 30 tablet 6  . triamcinolone (NASACORT AQ) 55 MCG/ACT AERO nasal inhaler Place 2 sprays into the nose daily. 1 Inhaler 12   No current facility-administered medications for this visit.   Allergies  Allergen Reactions  . Atenolol     Caused swelling  . Cephalexin     Upset her colitis     Review of Systems: All systems reviewed and negative except where noted in HPI.   Lab Results  Component Value Date   WBC 5.7 06/17/2015    HGB 14.9 06/17/2015   HCT 44.0 06/17/2015   MCV 86.3 06/17/2015   PLT 234 06/17/2015    Lab Results  Component Value Date   CREATININE 0.62 07/05/2015   BUN 9 07/05/2015   NA 142 07/05/2015   K 3.5 07/05/2015   CL 102 07/05/2015   CO2 30 07/05/2015   Lab Results  Component Value Date   ALT 12 06/17/2015   AST 18 06/17/2015   ALKPHOS 73 06/17/2015   BILITOT 0.6 06/17/2015       Dg Bone Density  09/03/2015  EXAM: DUAL X-RAY ABSORPTIOMETRY (DXA) FOR BONE MINERAL DENSITY IMPRESSION: Referring Physician:  Harrison Mons MD PATIENT: Name: LENETTA, GUNNOE Patient ID: [id number] Birth Date: 1961/05/09 Height: 59.0 in. Sex: Female Measured: 09/03/2015 Weight: 201.0 lbs. Indications: Caucasian, Depression, Estrogen Deficient, Glucocorticoids (Chronic) (255.41), Postmenopausal Fractures: Treatments: Calcium (E943.0) ASSESSMENT: The BMD measured at Femur Neck Right is 0.646 g/cm2 with a T-score of -2.8. This patient is considered osteoporotic according to [facility]) criteria. . Site Region Measured Date Measured Age YA BMD Significant CHANGE T-score DualFemur Neck Right 09/03/2015 54.5 -2.8 0.646 g/cm2 AP Spine L1-L4 09/03/2015 54.5 0.3 1.228 g/cm2 * Left Forearm Radius 33% 09/03/2015 54.5 0.2 0.909 g/cm2 World Health Organization [facility]) criteria for post-menopausal, Caucasian Women: Normal       T-score at or above -1 SD Osteopenia   T-score between -1 and -2.5 SD Osteoporosis T-score at or below -2.5 SD RECOMMENDATION: [place] recommends that FDA-approved medical therapies be considered in postmenopausal women and men age 57 or older with a: 1. Hip or vertebral (clinical or morphometric) fracture. 2. T-score of <-2.5 at the spine or hip. 3. Ten-year fracture probability by FRAX of 3% or greater for hip fracture or 20% or greater for major osteoporotic fracture. All treatment decisions require clinical judgment and consideration of individual patient  factors, including patient preferences, co-morbidities, previous drug use, risk factors not captured in the FRAX model (e.g. falls, vitamin D deficiency, increased bone turnover, interval significant decline in bone density) and possible under - or over-estimation of fracture risk by FRAX. All patients should ensure an adequate intake of dietary calcium (1200 mg/d) and vitamin D (800 IU daily) unless contraindicated. FOLLOW-UP: People with diagnosed cases of osteoporosis or at high risk for fracture should have regular bone mineral density tests. For patients eligible for Medicare, routine testing is allowed once every 2 years. The testing frequency can be increased to one year for patients who have rapidly progressing disease, those who are receiving or discontinuing medical therapy to restore bone mass, or have additional risk factors. I have  reviewed this report, and agree with the above findings. [facility] Radiology Electronically Signed   By: David  Martinique M.D.   On: 09/03/2015 16:25   Mm Screening Breast Tomo Bilateral  09/07/2015  CLINICAL DATA:  Screening. EXAM: DIGITAL SCREENING BILATERAL MAMMOGRAM WITH 3D TOMO WITH CAD COMPARISON:  Previous exam(s). ACR Breast Density Category b: There are scattered areas of fibroglandular density. FINDINGS: There are no findings suspicious for malignancy. Images were processed with CAD. IMPRESSION: No mammographic evidence of malignancy. A result letter of this screening mammogram will be mailed directly to the patient. RECOMMENDATION: Screening mammogram in one year. (Code:SM-B-01Y) BI-RADS CATEGORY  1: Negative. Electronically Signed   By: Ammie Ferrier M.D.   On: 09/07/2015 16:43    Physical Exam: BP 120/76 mmHg  Pulse 72  Ht 4\' 11"  (1.499 m)  Wt 202 lb 4 oz (91.74 kg)  BMI 40.83 kg/m2  LMP 11/26/2008 Constitutional: Pleasant,well-developed, female in no acute distress. HEENT: Normocephalic and atraumatic. Conjunctivae are normal. No scleral  icterus. Neck supple.  Cardiovascular: Normal rate, regular rhythm.  Pulmonary/chest: Effort normal and breath sounds normal. No wheezing, rales or rhonchi. Abdominal: Soft, nondistended, nontender. Bowel sounds active throughout. There are no masses palpable. No hepatomegaly. Extremities: no edema Lymphadenopathy: No cervical adenopathy noted. Neurological: Alert and oriented to person place and time. Skin: Skin is warm and dry. No rashes noted. Psychiatric: Normal mood and affect. Behavior is normal.   ASSESSMENT AND PLAN: 55 y/o female with longstanding left sided UC, history as outlined above. Previously on Remicade and Imuran, but now on mesalamine monotherapy and generally feeling well. No anemia, labs look good. Prior colonoscopies reviewed, she has had evidence of active inflammation with inflammatory polyps on most recent colonoscopies.   I discussed UC with her, long term associated risks, and goals of care, which include steroid free remission and minimizing her risk of colon cancer. She has had left sided disease almost 15 years at this time. I recommended a surveillance colonoscopy at this time and also to assess her disease activity on Lialda monotherapy. For maintenance dosing, I think she can go down to 2.4gm daily, however she prefers to keep it at 4.8gm daily until her colonoscopy is done. Given she feels well, I hope the inflammation is minimal, however if she has moderate active inflammation as she had on her last exam, will consider other therapies such as adding Rowasa, Curcumin, thiopurines, or others. She agreed with the plan. I discussed the risks / benefits of endoscopy with her. Given the Von Willebrand disease she is at risk for bleeding and will administer DDAVP prior to the procedure and after. We will reach out to her hematologist regarding recommendations for dosing and how much to give. Further recommendations pending the endoscopy result.   [place] Cellar,  MD [facility] Gastroenterology Pager [phone]

## 2015-09-13 ENCOUNTER — Other Ambulatory Visit: Payer: Self-pay

## 2015-09-13 ENCOUNTER — Telehealth: Payer: Self-pay

## 2015-09-13 DIAGNOSIS — K51919 Ulcerative colitis, unspecified with unspecified complications: Secondary | ICD-10-CM

## 2015-09-13 MED ORDER — DESMOPRESSIN ACETATE 1.5 MG/ML NA SOLN: NASAL | Status: DC

## 2015-09-13 NOTE — Telephone Encounter (Signed)
Please advise 

## 2015-09-13 NOTE — Telephone Encounter (Signed)
Patient is having a procedure on 09/21/2015 at [facility] and will have DDAVP administered. Dr. Havery Moros would like recommendations on dosing. See note from 09/10/2015. Please Advise.

## 2015-09-13 NOTE — Telephone Encounter (Signed)
Is Dr. Havery Moros planning to order DDAVP himself? I have never seen the patient before. We can fax Dr. Juliann Mule prior assessment in 2014. I can try to work her in this week to coordinate care with GI unless Dr. Havery Moros is OK with Dr. Juliann Mule recommendations from 2014

## 2015-09-13 NOTE — Telephone Encounter (Signed)
Thanks Dr. Alvy Bimler for offering to see her but I think the recommendations from Dr. Juliann Mule in 2014 would be fine as she has had no changes since that time. He reported the following that I can see from her records: "she should receive DDAVP an hour before the procedure. DDAVP should be given at 0.3 mcg/kg and infused over 20 minutes. If any excessive bleeding occurred during or following the procedure, we reccommended plasma-derived factor containing von Willebrand's. We also cautioned that she restricts free water and closely monitor hyponatremia with collecting a BMP 24 hours post-procedure. She was instructed to spray stimate to both nostrils 24 hours post procedure if biopsies were obtained."  Amber can you touch base with [facility] endo to ensure they are aware of this and I you can place the DDAVP orders under me, as well as the Stimate spray. Thanks

## 2015-09-13 NOTE — Telephone Encounter (Signed)
DDAVP has been ordered. I have sent Stimate spray to pt's pharmacy. Please advise pt to pick it up if biopsies are taken.

## 2015-09-13 NOTE — Addendum Note (Signed)
Addended by: Damian Leavell S on: 09/13/2015 01:30 PM   Modules accepted: Orders

## 2015-09-15 ENCOUNTER — Encounter (HOSPITAL_COMMUNITY): Payer: Self-pay | Admitting: *Deleted

## 2015-09-17 ENCOUNTER — Telehealth: Payer: Self-pay

## 2015-09-17 ENCOUNTER — Telehealth: Payer: Self-pay | Admitting: *Deleted

## 2015-09-17 ENCOUNTER — Telehealth: Payer: Self-pay | Admitting: Physician Assistant

## 2015-09-17 ENCOUNTER — Ambulatory Visit (INDEPENDENT_AMBULATORY_CARE_PROVIDER_SITE_OTHER): Payer: 59 | Admitting: Physician Assistant

## 2015-09-17 VITALS — BP 128/80 | HR 65 | Temp 98.2°F | Resp 14 | Ht <= 58 in | Wt 199.2 lb

## 2015-09-17 DIAGNOSIS — N61 Mastitis without abscess: Secondary | ICD-10-CM

## 2015-09-17 HISTORY — DX: Mastitis without abscess: N61.0

## 2015-09-17 MED ORDER — SULFAMETHOXAZOLE-TRIMETHOPRIM 800-160 MG PO TABS: 1.0000 | ORAL_TABLET | Freq: Two times a day (BID) | ORAL | Status: AC

## 2015-09-17 MED ORDER — SACCHAROMYCES BOULARDII 250 MG PO CAPS: 250.0000 mg | ORAL_CAPSULE | Freq: Two times a day (BID) | ORAL | Status: DC

## 2015-09-17 MED ORDER — DOXYCYCLINE HYCLATE 100 MG PO TABS: 100.0000 mg | ORAL_TABLET | Freq: Two times a day (BID) | ORAL | Status: DC

## 2015-09-17 NOTE — Progress Notes (Signed)
Lauren [name]  MRN: [id number] DOB: [birth year]  Subjective:  Pt presents to clinic with left breast tender for the past 4 days but then she had redness and pain and swelling over the last 24h that developed.  She has not had any trauma to the skin and has no lesions that she knows of.  She had a mammogram 1 week ago which was normal.    appt with [facility] for colonoscopy on 1/24 to evaluated UC status  Patient Active Problem List   Diagnosis Date Noted  . Benign neoplasm of colon 07/29/2013  . Von Willebrand disease ([place]) 07/17/2013  . Osteoporosis 04/04/2013  . VITAMIN B12 DEFICIENCY 04/05/2010  . SINOATRIAL NODE DYSFUNCTION 10/15/2009  . Reactive depression 09/17/2009  . HEMORRHOIDS-EXTERNAL 05/18/2009  . Anxiety state 02/26/2008  . Essential hypertension 02/26/2008  . Ulcerative colitis ([place]) 02/26/2008  . COUGH, CHRONIC 02/26/2008    Current Outpatient Prescriptions on File Prior to Visit  Medication Sig Dispense Refill  . Calcium Carbonate-Vit D-Min (CALCIUM 1200) 1200-1000 MG-UNIT CHEW Chew 1 tablet by mouth daily.    Marland Kitchen desmopressin (STIMATE) 1.5 MG/ML SOLN Spray 1 spray in both nostrils 24 hours post procedure. 1 Bottle 0  . fexofenadine (ALLEGRA) 180 MG tablet Take 180 mg by mouth daily.    Marland Kitchen FLUoxetine (PROZAC) 20 MG capsule Take 1 capsule (20 mg total) by mouth daily. 90 capsule 3  . fluticasone (FLONASE) 50 MCG/ACT nasal spray Place 1 spray into both nostrils daily.    . mesalamine (LIALDA) 1.2 G EC tablet Take 4 tablets (4.8 g total) by mouth daily. 120 tablet 11  . metoprolol succinate (TOPROL-XL) 50 MG 24 hr tablet Take 1 tablet (50 mg total) by mouth daily. 90 tablet 3  . potassium chloride SA (K-DUR,KLOR-CON) 20 MEQ tablet Take one by mouth every 1-2 days (Patient taking differently: Take 20 mEq by mouth every other day. ) 30 tablet 6  . triamcinolone (NASACORT AQ) 55 MCG/ACT AERO nasal inhaler Place 2 sprays into the nose daily. (Patient not taking: Reported on  09/15/2015) 1 Inhaler 12   No current facility-administered medications on file prior to visit.    Allergies  Allergen Reactions  . Atenolol     Caused swelling  . Cephalexin     Upset her colitis    Review of Systems  Constitutional: Negative for fever and chills.  Skin: Positive for wound.   Objective:  BP 128/80 mmHg  Pulse 65  Temp(Src) 98.2 F (36.8 C) (Oral)  Resp 14  Ht 4' 10"  (1.473 m)  Wt 199 lb 3.2 oz (90.357 kg)  BMI 41.64 kg/m2  SpO2 94%  LMP 11/26/2008  Physical Exam  Constitutional: She is oriented to person, place, and time and well-developed, well-nourished, and in no distress.  HENT:  Head: Normocephalic and atraumatic.  Right Ear: Hearing and external ear normal.  Left Ear: Hearing and external ear normal.  Eyes: Conjunctivae are normal.  Neck: Normal range of motion.  Pulmonary/Chest: Effort normal. Right breast exhibits no inverted nipple, no mass, no nipple discharge, no skin change and no tenderness. Left breast exhibits skin change.    Lymphadenopathy:    She has no axillary adenopathy.  Neurological: She is alert and oriented to person, place, and time. Gait normal.  Skin: Skin is warm and dry.  Psychiatric: Mood, memory, affect and judgment normal.  Vitals reviewed.   Assessment and Plan :  Cellulitis of female breast - Plan: doxycycline (VIBRA-TABS) 100 MG tablet, saccharomyces  boulardii (FLORASTOR) 250 MG capsule, Care order/instruction  Pt has a significant breast cellulitis with a normal mammogram last week - we will start abx and she will f/u for her colonoscopy as scheduled. She will f/u with me tomorrow is she is worse or Sunday if she is not getting better.  There is no obvious abscess at this time for I&D but discussed with patient that this is possible to form - she will start Florastor to help her UC with the abx use  Called and spoke with Rollene Fare at [facility] - they approved the abx and recommended to continue with scheduled  colonoscopy but she needs to start a probiotic  Windell Hummingbird PA-C  [facility] 09/17/2015 2:37 PM

## 2015-09-17 NOTE — Telephone Encounter (Signed)
Pharmacy is calling because patient can't take doxycycline and wants to get the alternative.  [place].

## 2015-09-17 NOTE — Telephone Encounter (Signed)
Please let pt know that I sent in a Rx for Septra - instead of the doxy

## 2015-09-17 NOTE — Patient Instructions (Signed)
Start Florastor to help with the side effects of the medications  Recheck with me if things are not improving in 48h or worse in 24h

## 2015-09-17 NOTE — Telephone Encounter (Signed)
LMOM of this info

## 2015-09-17 NOTE — Telephone Encounter (Signed)
I will change to Septra - please let pt know

## 2015-09-17 NOTE — Telephone Encounter (Signed)
Lauren [name] calling because patient is there with a breast cellulitis. She needs antibiotics either Bactrim or Doxycycline. She is also scheduled for a colonoscopy on 09/21/15 and wants to know if it is ok to keep this appointment. Spoke with Dr. Havery Moros. Either antibiotic is ok, needs to be on Florastor also. Ok to keep colonoscopy appointment. [name] aware.

## 2015-09-19 NOTE — Telephone Encounter (Signed)
Patient called back into clinic, I let her know why Allie had called her, she stated that she is taking th doxy and that is isnt affecting her like she thought it would because she is taking a probiotic before hand and it seems to be working just fine and she is getting better. i told her to call back if anything changes

## 2015-09-19 NOTE — Telephone Encounter (Signed)
Left message to return call 

## 2015-09-20 ENCOUNTER — Encounter (HOSPITAL_COMMUNITY): Payer: Self-pay | Admitting: *Deleted

## 2015-09-21 ENCOUNTER — Ambulatory Visit (HOSPITAL_COMMUNITY)
Admission: RE | Admit: 2015-09-21 | Discharge: 2015-09-21 | Disposition: A | Discharge status: Home / Self Care | Payer: 59 | Source: Ambulatory Visit | Attending: Gastroenterology | Admitting: Gastroenterology

## 2015-09-21 ENCOUNTER — Encounter (HOSPITAL_COMMUNITY)
Admission: RE | Disposition: A | Discharge status: Home / Self Care | Payer: Self-pay | Source: Ambulatory Visit | Attending: Gastroenterology

## 2015-09-21 ENCOUNTER — Encounter (HOSPITAL_COMMUNITY): Payer: Self-pay

## 2015-09-21 ENCOUNTER — Ambulatory Visit (HOSPITAL_COMMUNITY): Payer: 59 | Admitting: Anesthesiology

## 2015-09-21 ENCOUNTER — Other Ambulatory Visit: Payer: Self-pay | Admitting: *Deleted

## 2015-09-21 DIAGNOSIS — D123 Benign neoplasm of transverse colon: Secondary | ICD-10-CM | POA: Insufficient documentation

## 2015-09-21 DIAGNOSIS — D680 Von Willebrand's disease: Secondary | ICD-10-CM | POA: Insufficient documentation

## 2015-09-21 DIAGNOSIS — K648 Other hemorrhoids: Secondary | ICD-10-CM | POA: Diagnosis not present

## 2015-09-21 DIAGNOSIS — I10 Essential (primary) hypertension: Secondary | ICD-10-CM | POA: Insufficient documentation

## 2015-09-21 DIAGNOSIS — M1990 Unspecified osteoarthritis, unspecified site: Secondary | ICD-10-CM | POA: Diagnosis not present

## 2015-09-21 DIAGNOSIS — K5150 Left sided colitis without complications: Secondary | ICD-10-CM | POA: Insufficient documentation

## 2015-09-21 DIAGNOSIS — F419 Anxiety disorder, unspecified: Secondary | ICD-10-CM | POA: Diagnosis not present

## 2015-09-21 DIAGNOSIS — M8580 Other specified disorders of bone density and structure, unspecified site: Secondary | ICD-10-CM | POA: Diagnosis not present

## 2015-09-21 DIAGNOSIS — Z1211 Encounter for screening for malignant neoplasm of colon: Secondary | ICD-10-CM | POA: Insufficient documentation

## 2015-09-21 DIAGNOSIS — K219 Gastro-esophageal reflux disease without esophagitis: Secondary | ICD-10-CM | POA: Insufficient documentation

## 2015-09-21 DIAGNOSIS — Z8614 Personal history of Methicillin resistant Staphylococcus aureus infection: Secondary | ICD-10-CM | POA: Insufficient documentation

## 2015-09-21 DIAGNOSIS — F329 Major depressive disorder, single episode, unspecified: Secondary | ICD-10-CM | POA: Diagnosis not present

## 2015-09-21 DIAGNOSIS — K51919 Ulcerative colitis, unspecified with unspecified complications: Secondary | ICD-10-CM

## 2015-09-21 DIAGNOSIS — Z79899 Other long term (current) drug therapy: Secondary | ICD-10-CM | POA: Diagnosis not present

## 2015-09-21 DIAGNOSIS — D649 Anemia, unspecified: Secondary | ICD-10-CM | POA: Insufficient documentation

## 2015-09-21 DIAGNOSIS — K5120 Ulcerative (chronic) proctitis without complications: Secondary | ICD-10-CM

## 2015-09-21 DIAGNOSIS — K5190 Ulcerative colitis, unspecified, without complications: Secondary | ICD-10-CM | POA: Insufficient documentation

## 2015-09-21 DIAGNOSIS — I471 Supraventricular tachycardia: Secondary | ICD-10-CM | POA: Diagnosis not present

## 2015-09-21 HISTORY — DX: Mastitis without abscess: N61.0

## 2015-09-21 HISTORY — DX: Unspecified hearing loss, unspecified ear: H91.90

## 2015-09-21 HISTORY — PX: COLONOSCOPY WITH PROPOFOL: SHX5780

## 2015-09-21 SURGERY — COLONOSCOPY WITH PROPOFOL
Anesthesia: Monitor Anesthesia Care

## 2015-09-21 MED ORDER — LACTATED RINGERS IV SOLN
INTRAVENOUS | Status: DC
  Administered 2015-09-21: 12:00:00 via INTRAVENOUS

## 2015-09-21 MED ORDER — LIDOCAINE HCL (CARDIAC) 20 MG/ML IV SOLN
INTRAVENOUS | Status: DC | PRN
  Administered 2015-09-21: 50 mg via INTRAVENOUS

## 2015-09-21 MED ORDER — PROPOFOL 10 MG/ML IV BOLUS
INTRAVENOUS | Status: DC | PRN
  Administered 2015-09-21: 20 mg via INTRAVENOUS
  Administered 2015-09-21 (×2): 10 mg via INTRAVENOUS
  Administered 2015-09-21 (×4): 20 mg via INTRAVENOUS
  Administered 2015-09-21: 30 mg via INTRAVENOUS
  Administered 2015-09-21: 50 mg via INTRAVENOUS
  Administered 2015-09-21 (×2): 20 mg via INTRAVENOUS
  Administered 2015-09-21: 30 mg via INTRAVENOUS
  Administered 2015-09-21: 20 mg via INTRAVENOUS

## 2015-09-21 MED ORDER — PROPOFOL 10 MG/ML IV BOLUS
INTRAVENOUS | Status: AC
  Filled 2015-09-21: qty 60

## 2015-09-21 MED ORDER — LIDOCAINE HCL (CARDIAC) 20 MG/ML IV SOLN
INTRAVENOUS | Status: AC
  Filled 2015-09-21: qty 5

## 2015-09-21 MED ORDER — DESMOPRESSIN ACETATE 1.5 MG/ML NA SOLN: 2.0000 | Freq: Once | NASAL | Status: DC

## 2015-09-21 MED ORDER — SODIUM CHLORIDE 0.9 % IV SOLN
0.3000 ug/kg | Freq: Once | INTRAVENOUS | Status: AC
  Administered 2015-09-21: 27.2 ug via INTRAVENOUS
  Filled 2015-09-21: qty 6.8

## 2015-09-21 MED ORDER — SODIUM CHLORIDE 0.9 % IV SOLN
INTRAVENOUS | Status: DC
Start: 2015-09-21 — End: 2015-09-21
  Administered 2015-09-21: 500 mL via INTRAVENOUS

## 2015-09-21 SURGICAL SUPPLY — 21 items

## 2015-09-21 NOTE — Op Note (Signed)
[facility] [place], [id number]   COLONOSCOPY PROCEDURE REPORT  PATIENT: Lauren [name], Lauren [name]  MR#: [id number] BIRTHDATE: 04-Feb-1961 , 54  yrs. old GENDER: female ENDOSCOPIST: Yetta Flock, MD REFERRED BY: PROCEDURE DATE:  09/21/2015 PROCEDURE:   Colonoscopy, surveillance and Colonoscopy with biopsy First Screening Colonoscopy - Avg.  risk and is 50 yrs.  old or older - No.  Prior Negative Screening - Now for repeat screening. Above average risk  History of Adenoma - Now for follow-up colonoscopy & has been > or = to 3 yrs.  N/A  Polyps removed today? Yes ASA CLASS:   Class III INDICATIONS: left sided UC, longstanding, on mesalamine; Colorectal Neoplasm Risk Assessment for this procedure is average risk. MEDICATIONS: Per Anesthesia  DESCRIPTION OF PROCEDURE:   After the risks benefits and alternatives of the procedure were thoroughly explained, informed consent was obtained.  The digital rectal exam revealed no abnormalities of the rectum.   The Pentax Ped Colon [id number] endoscope was introduced through the anus and advanced to the terminal ileum which was intubated for a short distance. No adverse events experienced.   The quality of the prep was good.  The instrument was then slowly withdrawn as the colon was fully examined. Estimated blood loss is zero unless otherwise noted in this procedure report.      COLON FINDINGS: There was evidence of mild patch (few cm) of inflammation roughly 35cm from the anal verge characterized by patchy erythema and some erosions, with one small ulceration. Otherwise, the colon had no other inflammatory changes noted. Specifically, the left colon, other than mentioned above, appeared to have mucosal healing.  Random biopsies were taken every 10cm or so through the left colon to rule out dysplastic changes, but no focal pathology was appreciated.  A 94mm sessile polyp was noted in the transverse colon and  removed with cold forceps.  The ileum was normal.  Retroflexed views revealed internal hemorrhoids. The time to cecum = 3.5 Withdrawal time = 16.1   The scope was withdrawn and the procedure completed. COMPLICATIONS: There were no immediate complications.   ENDOSCOPIC IMPRESSION: Overall, good control of left sided colitis, with only one small area (few cm in length) with mild inflammatory changes. Random biopsies taken to rule out dysplasia Small transverse colon polyp Otherwise normal remainder of examined colon and ileum Internal hemorrhoids  RECOMMENDATIONS: Await pathology results Resume medications including Lialda Resume diet Per Hematology: spray Stimate in both nostrils 24 hours after procedure completed Restrict free water and obtain basic metabolic panel tomorrow to ensure stable sodium levels  eSigned:  Yetta Flock, MD 09/21/2015 12:02 PM   cc:  the patient   PATIENT NAME:  Lauren [name] MR#: [id number]

## 2015-09-21 NOTE — H&P (View-Only) (Signed)
HPI :  55 y/o female, new patient to me, here for follow up for UC. She has a history of what appears to be left sided ulcerative colitis, diagnosed around 14-15 years ago from what she can recall. She has been on Remicade in the past for her colitis, which she thinks she did well for a few years. She had a severe MRSA infection of her ear and ultimately Remicade was held and eventually was stopped given she had done well when it was held. She has been on Imuran in the past, she thinks she tolerated it well, she thinks it was weaned off given she had been feeling well also. She has never been on Humira or other anti-TNF agents. She has never been on methotrexate. She has been on Lialda for a long time, prior to that Asacol. She is taking 4.8gm Lialda for a long time. She reports it works well and she generally has been feeling well. She has roughly 1-2 Bms per day or so. No blood in the stools. Stools are normally loose or soft. No abdominal pains. She does not have urgency for the most part, sometimes she does. She has never been hospitalized for her colitis in the past. She has not had many flares recently, but had a severe flare several years ago for which she was on steroids for a period of time.  She denies NSAIDs. She has Von Willebrands. She bleeds easily but has never had significant bleeding / hemorrhages in the past. She has had DDAVP infusions prior to her last colonoscopy or Stilmate nose spray.    She is not aware of much of her family history in regards to [facility] or IBD.  Colonoscopy 2014 - active rectal inflammation, inflammatory polyp   Past Medical History  Diagnosis Date  . Ulcerative colitis   . SVT (supraventricular tachycardia) ([place])   . Personal history of meningioma of the brain   . Von Willebrands disease ([place])   . Anxiety and depression   . Vitamin B12 deficiency   . GERD (gastroesophageal reflux disease)   . Tennis elbow   . MRSA (methicillin resistant Staphylococcus  aureus)   . Hypertension   . Osteopenia   . Clotting disorder ([place])   . Anemia   . Internal hemorrhoids   . Inflammatory polyps of colon [facility])      Past Surgical History  Procedure Laterality Date  . Brain meningioma excision  05/2007  . Implantation bone anchored hearing aid    . Cardiac electrophysiology study and ablation  10/22/09  . Colonoscopy N/A 07/29/2013    Procedure: COLONOSCOPY;  Surgeon: Lafayette Dragon, MD;  Location: [facility] ENDOSCOPY;  Service: Endoscopy;  Laterality: N/A;  . Wisdom tooth extraction     Family History  Problem Relation Age of Onset  . Heart disease Maternal Grandfather   . Heart disease Mother   . Stroke Mother   . Diabetes Maternal Uncle   . Colon cancer Neg Hx    Social History  Substance Use Topics  . Smoking status: Never Smoker   . Smokeless tobacco: Never Used  . Alcohol Use: No   Current Outpatient Prescriptions  Medication Sig Dispense Refill  . fexofenadine-pseudoephedrine (ALLEGRA-D 24) 180-240 MG 24 hr tablet Take 1 tablet by mouth daily.    Marland Kitchen FLUoxetine (PROZAC) 20 MG capsule Take 1 capsule (20 mg total) by mouth daily. 90 capsule 3  . mesalamine (LIALDA) 1.2 G EC tablet Take 4 tablets (4.8 g total) by  mouth daily. 120 tablet 11  . metoprolol succinate (TOPROL-XL) 50 MG 24 hr tablet Take 1 tablet (50 mg total) by mouth daily. 90 tablet 3  . Multiple Vitamin (MULTIVITAMIN) capsule Take 1 capsule by mouth daily.     . potassium chloride SA (K-DUR,KLOR-CON) 20 MEQ tablet Take one by mouth every 1-2 days 30 tablet 6  . triamcinolone (NASACORT AQ) 55 MCG/ACT AERO nasal inhaler Place 2 sprays into the nose daily. 1 Inhaler 12   No current facility-administered medications for this visit.   Allergies  Allergen Reactions  . Atenolol     Caused swelling  . Cephalexin     Upset her colitis     Review of Systems: All systems reviewed and negative except where noted in HPI.   Lab Results  Component Value Date   WBC 5.7 06/17/2015    HGB 14.9 06/17/2015   HCT 44.0 06/17/2015   MCV 86.3 06/17/2015   PLT 234 06/17/2015    Lab Results  Component Value Date   CREATININE 0.62 07/05/2015   BUN 9 07/05/2015   NA 142 07/05/2015   K 3.5 07/05/2015   CL 102 07/05/2015   CO2 30 07/05/2015   Lab Results  Component Value Date   ALT 12 06/17/2015   AST 18 06/17/2015   ALKPHOS 73 06/17/2015   BILITOT 0.6 06/17/2015       Dg Bone Density  09/03/2015  EXAM: DUAL X-RAY ABSORPTIOMETRY (DXA) FOR BONE MINERAL DENSITY IMPRESSION: Referring Physician:  Harrison Mons MD PATIENT: Name: DEZRA, SOTOLONGO Patient ID: [id number] Birth Date: Jul 16, 1961 Height: 59.0 in. Sex: Female Measured: 09/03/2015 Weight: 201.0 lbs. Indications: Caucasian, Depression, Estrogen Deficient, Glucocorticoids (Chronic) (255.41), Postmenopausal Fractures: Treatments: Calcium (E943.0) ASSESSMENT: The BMD measured at Femur Neck Right is 0.646 g/cm2 with a T-score of -2.8. This patient is considered osteoporotic according to [facility]) criteria. . Site Region Measured Date Measured Age YA BMD Significant CHANGE T-score DualFemur Neck Right 09/03/2015 54.5 -2.8 0.646 g/cm2 AP Spine L1-L4 09/03/2015 54.5 0.3 1.228 g/cm2 * Left Forearm Radius 33% 09/03/2015 54.5 0.2 0.909 g/cm2 World Health Organization [facility]) criteria for post-menopausal, Caucasian Women: Normal       T-score at or above -1 SD Osteopenia   T-score between -1 and -2.5 SD Osteoporosis T-score at or below -2.5 SD RECOMMENDATION: [facility] recommends that FDA-approved medical therapies be considered in postmenopausal women and men age 27 or older with a: 1. Hip or vertebral (clinical or morphometric) fracture. 2. T-score of <-2.5 at the spine or hip. 3. Ten-year fracture probability by FRAX of 3% or greater for hip fracture or 20% or greater for major osteoporotic fracture. All treatment decisions require clinical judgment and consideration of individual patient  factors, including patient preferences, co-morbidities, previous drug use, risk factors not captured in the FRAX model (e.g. falls, vitamin D deficiency, increased bone turnover, interval significant decline in bone density) and possible under - or over-estimation of fracture risk by FRAX. All patients should ensure an adequate intake of dietary calcium (1200 mg/d) and vitamin D (800 IU daily) unless contraindicated. FOLLOW-UP: People with diagnosed cases of osteoporosis or at high risk for fracture should have regular bone mineral density tests. For patients eligible for Medicare, routine testing is allowed once every 2 years. The testing frequency can be increased to one year for patients who have rapidly progressing disease, those who are receiving or discontinuing medical therapy to restore bone mass, or have additional risk factors. I have  reviewed this report, and agree with the above findings. [facility] Radiology Electronically Signed   By: David  Martinique M.D.   On: 09/03/2015 16:25   Mm Screening Breast Tomo Bilateral  09/07/2015  CLINICAL DATA:  Screening. EXAM: DIGITAL SCREENING BILATERAL MAMMOGRAM WITH 3D TOMO WITH CAD COMPARISON:  Previous exam(s). ACR Breast Density Category b: There are scattered areas of fibroglandular density. FINDINGS: There are no findings suspicious for malignancy. Images were processed with CAD. IMPRESSION: No mammographic evidence of malignancy. A result letter of this screening mammogram will be mailed directly to the patient. RECOMMENDATION: Screening mammogram in one year. (Code:SM-B-01Y) BI-RADS CATEGORY  1: Negative. Electronically Signed   By: Ammie Ferrier M.D.   On: 09/07/2015 16:43    Physical Exam: BP 120/76 mmHg  Pulse 72  Ht 4\' 11"  (1.499 m)  Wt 202 lb 4 oz (91.74 kg)  BMI 40.83 kg/m2  LMP 11/26/2008 Constitutional: Pleasant,well-developed, female in no acute distress. HEENT: Normocephalic and atraumatic. Conjunctivae are normal. No scleral  icterus. Neck supple.  Cardiovascular: Normal rate, regular rhythm.  Pulmonary/chest: Effort normal and breath sounds normal. No wheezing, rales or rhonchi. Abdominal: Soft, nondistended, nontender. Bowel sounds active throughout. There are no masses palpable. No hepatomegaly. Extremities: no edema Lymphadenopathy: No cervical adenopathy noted. Neurological: Alert and oriented to person place and time. Skin: Skin is warm and dry. No rashes noted. Psychiatric: Normal mood and affect. Behavior is normal.   ASSESSMENT AND PLAN: 55 y/o female with longstanding left sided UC, history as outlined above. Previously on Remicade and Imuran, but now on mesalamine monotherapy and generally feeling well. No anemia, labs look good. Prior colonoscopies reviewed, she has had evidence of active inflammation with inflammatory polyps on most recent colonoscopies.   I discussed UC with her, long term associated risks, and goals of care, which include steroid free remission and minimizing her risk of colon cancer. She has had left sided disease almost 15 years at this time. I recommended a surveillance colonoscopy at this time and also to assess her disease activity on Lialda monotherapy. For maintenance dosing, I think she can go down to 2.4gm daily, however she prefers to keep it at 4.8gm daily until her colonoscopy is done. Given she feels well, I hope the inflammation is minimal, however if she has moderate active inflammation as she had on her last exam, will consider other therapies such as adding Rowasa, Curcumin, thiopurines, or others. She agreed with the plan. I discussed the risks / benefits of endoscopy with her. Given the Von Willebrand disease she is at risk for bleeding and will administer DDAVP prior to the procedure and after. We will reach out to her hematologist regarding recommendations for dosing and how much to give. Further recommendations pending the endoscopy result.   Craig Cellar,  MD [facility] Gastroenterology Pager [phone]

## 2015-09-21 NOTE — Interval H&P Note (Signed)
History and Physical Interval Note:  09/21/2015 9:41 AM  Lauren [name]  has presented today for surgery, with the diagnosis of ulcerative colitis  The various methods of treatment have been discussed with the patient and family. After consideration of risks, benefits and other options for treatment, the patient has consented to  Procedure(s): COLONOSCOPY WITH PROPOFOL (N/A) as a surgical intervention .  The patient's history has been reviewed, patient examined, no change in status, stable for surgery.  I have reviewed the patient's chart and labs.  Questions were answered to the patient's satisfaction.     Lauren [name]

## 2015-09-21 NOTE — Transfer of Care (Signed)
Immediate Anesthesia Transfer of Care Note  Patient: Lauren [name]  Procedure(s) Performed: Procedure(s): COLONOSCOPY WITH PROPOFOL (N/A)  Patient Location: PACU  Anesthesia Type:MAC  Level of Consciousness: Patient easily awoken, sedated, comfortable, cooperative, following commands, responds to stimulation.   Airway & Oxygen Therapy: Patient spontaneously breathing, ventilating well, oxygen via simple oxygen mask.  Post-op Assessment: Report given to PACU RN, vital signs reviewed and stable, moving all extremities.   Post vital signs: Reviewed and stable.  Complications: No apparent anesthesia complications

## 2015-09-21 NOTE — Anesthesia Postprocedure Evaluation (Signed)
Anesthesia Post Note  Patient: Lauren [name]  Procedure(s) Performed: Procedure(s) (LRB): COLONOSCOPY WITH PROPOFOL (N/A)  Patient location during evaluation: PACU Anesthesia Type: MAC Level of consciousness: awake and alert Pain management: pain level controlled Vital Signs Assessment: post-procedure vital signs reviewed and stable Respiratory status: spontaneous breathing, nonlabored ventilation, respiratory function stable and patient connected to nasal cannula oxygen Cardiovascular status: stable and blood pressure returned to baseline Anesthetic complications: no    Last Vitals:  Filed Vitals:   09/21/15 0914 09/21/15 1158  BP: 160/79 139/57  Pulse: 61 69  Temp: 36.7 C 36.8 C  Resp: 17 17    Last Pain: There were no vitals filed for this visit.               Ebrahim Deremer J

## 2015-09-21 NOTE — Anesthesia Preprocedure Evaluation (Addendum)
Anesthesia Evaluation  Patient identified by MRN, date of birth, ID band Patient awake    Reviewed: Allergy & Precautions, NPO status , Patient's Chart, lab work & pertinent test results  Airway Mallampati: II  TM Distance: >3 FB Neck ROM: Full    Dental no notable dental hx.    Pulmonary neg pulmonary ROS,    Pulmonary exam normal breath sounds clear to auscultation       Cardiovascular hypertension, Pt. on medications and Pt. on home beta blockers Normal cardiovascular exam Rhythm:Regular Rate:Normal     Neuro/Psych PSYCHIATRIC DISORDERS Anxiety Depression negative neurological ROS     GI/Hepatic Neg liver ROS, PUD,   Endo/Other  negative endocrine ROS  Renal/GU negative Renal ROS  negative genitourinary   Musculoskeletal  (+) Arthritis ,   Abdominal   Peds negative pediatric ROS (+)  Hematology  (+) anemia ,   Anesthesia Other Findings   Reproductive/Obstetrics negative OB ROS                            Anesthesia Physical Anesthesia Plan  ASA: III  Anesthesia Plan: MAC   Post-op Pain Management:    Induction: Intravenous  Airway Management Planned: Natural Airway and Simple Face Mask  Additional Equipment:   Intra-op Plan:   Post-operative Plan:   Informed Consent: I have reviewed the patients History and Physical, chart, labs and discussed the procedure including the risks, benefits and alternatives for the proposed anesthesia with the patient or authorized representative who has indicated his/her understanding and acceptance.   Dental advisory given  Plan Discussed with: CRNA  Anesthesia Plan Comments:        Anesthesia Quick Evaluation

## 2015-09-21 NOTE — Discharge Instructions (Signed)
Colonoscopy, Care After °Refer to this sheet in the next few weeks. These instructions provide you with information on caring for yourself after your procedure. Your health care provider may also give you more specific instructions. Your treatment has been planned according to current medical practices, but problems sometimes occur. Call your health care provider if you have any problems or questions after your procedure. °WHAT TO EXPECT AFTER THE PROCEDURE  °After your procedure, it is typical to have the following: °· A small amount of blood in your stool. °· Moderate amounts of gas and mild abdominal cramping or bloating. °HOME CARE INSTRUCTIONS °· Do not drive, operate machinery, or sign important documents for 24 hours. °· You may shower and resume your regular physical activities, but move at a slower pace for the first 24 hours. °· Take frequent rest periods for the first 24 hours. °· Walk around or put a warm pack on your abdomen to help reduce abdominal cramping and bloating. °· Drink enough fluids to keep your urine clear or pale yellow. °· You may resume your normal diet as instructed by your health care provider. Avoid heavy or fried foods that are hard to digest. °· Avoid drinking alcohol for 24 hours or as instructed by your health care provider. °· Only take over-the-counter or prescription medicines as directed by your health care provider. °· If a tissue sample (biopsy) was taken during your procedure: °¨ Do not take aspirin or blood thinners for 7 days, or as instructed by your health care provider. °¨ Do not drink alcohol for 7 days, or as instructed by your health care provider. °¨ Eat soft foods for the first 24 hours. °SEEK MEDICAL CARE IF: °You have persistent spotting of blood in your stool 2-3 days after the procedure. °SEEK IMMEDIATE MEDICAL CARE IF: °· You have more than a small spotting of blood in your stool. °· You pass large blood clots in your stool. °· Your abdomen is swollen  (distended). °· You have nausea or vomiting. °· You have a fever. °· You have increasing abdominal pain that is not relieved with medicine. °  °This information is not intended to replace advice given to you by your health care provider. Make sure you discuss any questions you have with your health care provider. °  °Document Released: 03/28/2004 Document Revised: 06/04/2013 Document Reviewed: 04/21/2013 °Elsevier Interactive Patient Education ©2016 Elsevier Inc. ° °

## 2015-09-22 ENCOUNTER — Other Ambulatory Visit (INDEPENDENT_AMBULATORY_CARE_PROVIDER_SITE_OTHER): Payer: 59

## 2015-09-22 ENCOUNTER — Encounter (HOSPITAL_COMMUNITY): Payer: Self-pay | Admitting: Gastroenterology

## 2015-09-22 DIAGNOSIS — K5120 Ulcerative (chronic) proctitis without complications: Secondary | ICD-10-CM

## 2015-09-22 LAB — BASIC METABOLIC PANEL
BUN: 12 mg/dL (ref 6–23)
CO2: 29 mEq/L (ref 19–32)
Calcium: 9.3 mg/dL (ref 8.4–10.5)
Chloride: 99 mEq/L (ref 96–112)
Creatinine, Ser: 0.64 mg/dL (ref 0.40–1.20)
GFR: 102.56 mL/min (ref >60.00)
Glucose, Bld: 103 mg/dL — ABNORMAL HIGH (ref 70–99)
Potassium: 3.2 mEq/L — ABNORMAL LOW (ref 3.5–5.1)
Sodium: 135 mEq/L (ref 135–145)

## 2015-10-06 ENCOUNTER — Other Ambulatory Visit: Payer: Self-pay | Admitting: Internal Medicine

## 2015-10-06 NOTE — Telephone Encounter (Signed)
Chelle patient saw in 10/16 for annual physical can we refill

## 2015-10-07 ENCOUNTER — Ambulatory Visit: Payer: BLUE CROSS/BLUE SHIELD | Admitting: Certified Nurse Midwife

## 2015-10-07 NOTE — Telephone Encounter (Signed)
Meds ordered this encounter  Medications  . FLUoxetine (PROZAC) 20 MG capsule    Sig: TAKE ONE CAPSULE BY MOUTH DAILY    Dispense:  90 capsule    Refill:  2

## 2015-10-08 ENCOUNTER — Ambulatory Visit (INDEPENDENT_AMBULATORY_CARE_PROVIDER_SITE_OTHER): Payer: 59 | Admitting: Physician Assistant

## 2015-10-08 VITALS — BP 128/80 | HR 57 | Temp 98.4°F | Resp 17 | Ht 60.63 in | Wt 198.0 lb

## 2015-10-08 DIAGNOSIS — I10 Essential (primary) hypertension: Secondary | ICD-10-CM

## 2015-10-08 DIAGNOSIS — N61 Mastitis without abscess: Secondary | ICD-10-CM

## 2015-10-08 MED ORDER — METOPROLOL SUCCINATE ER 50 MG PO TB24: 50.0000 mg | ORAL_TABLET | Freq: Every day | ORAL | Status: DC

## 2015-10-08 MED ORDER — AMOXICILLIN-POT CLAVULANATE 875-125 MG PO TABS: 1.0000 | ORAL_TABLET | Freq: Two times a day (BID) | ORAL | Status: DC

## 2015-10-08 NOTE — Progress Notes (Signed)
Patient ID: Lauren [name], female    DOB: 05/09/1961, 55 y.o.   MRN: [id number]  PCP: Lucilla Edin, MD  Subjective:   Chief Complaint  Patient presents with  . Cellulitis    left breast     HPI Presents for evaluation of possible recurrent cellulitis of the LEFT breast.  Seen here on 09/17/2015 with similar symptoms, but had waited longer before seeking evaluation. At the time, the pain was associated with redness and swelling of the lateral LEFT breast. Mammogram the week prior was normal.  No recalled injury or trauma to the area.  She took a course of doxycycline, and while she forgot the second dose on 3 of the days, she took all the tablets prescribed (#20) and had complete resolution of the symptoms.  Her symptoms recurred yesterday. She notes pain in the same location, but has no redness or swelling. No trauma or injury in the interim. No new bras.   Ulcerative colitis makes use of antibiotics problematic for her. She tolerated the doxycycline with probiotics.    Review of Systems  Constitutional: Negative for fever and chills.  Skin: Negative for color change, pallor, rash and wound.  Hematological: Negative for adenopathy.       Patient Active Problem List   Diagnosis Date Noted  . Ulcerative colitis with complication (HCC)   . Benign neoplasm of colon 07/29/2013  . Von Willebrand disease (HCC) 07/17/2013  . Osteoporosis 04/04/2013  . VITAMIN B12 DEFICIENCY 04/05/2010  . SINOATRIAL NODE DYSFUNCTION 10/15/2009  . Reactive depression 09/17/2009  . HEMORRHOIDS-EXTERNAL 05/18/2009  . Anxiety state 02/26/2008  . Essential hypertension 02/26/2008  . Ulcerative colitis (HCC) 02/26/2008  . COUGH, CHRONIC 02/26/2008     Prior to Admission medications   Medication Sig Start Date End Date Taking? Authorizing Provider  Calcium Carbonate-Vit D-Min (CALCIUM 1200) 1200-1000 MG-UNIT CHEW Chew 1 tablet by mouth daily.   Yes Historical Provider, MD  fexofenadine  (ALLEGRA) 180 MG tablet Take 180 mg by mouth daily.   Yes Historical Provider, MD  FLUoxetine (PROZAC) 20 MG capsule TAKE ONE CAPSULE BY MOUTH DAILY 10/07/15  Yes Jordan Pardini, PA-C  fluticasone (FLONASE) 50 MCG/ACT nasal spray Place 1 spray into both nostrils daily.   Yes Historical Provider, MD  mesalamine (LIALDA) 1.2 G EC tablet Take 4 tablets (4.8 g total) by mouth daily. 07/05/15  Yes Gwenlyn Found Copland, MD  metoprolol succinate (TOPROL-XL) 50 MG 24 hr tablet Take 1 tablet (50 mg total) by mouth daily. 10/03/14  Yes Tonye Pearson, MD  potassium chloride SA (K-DUR,KLOR-CON) 20 MEQ tablet Take one by mouth every 1-2 days Patient taking differently: Take 20 mEq by mouth every other day.  07/06/15  Yes Gwenlyn Found Copland, MD  saccharomyces boulardii (FLORASTOR) 250 MG capsule Take 1 capsule (250 mg total) by mouth 2 (two) times daily. 09/17/15  Yes Morrell Riddle, PA-C  desmopressin (STIMATE) 1.5 MG/ML SOLN Spray 1 spray in both nostrils 24 hours post procedure. Patient not taking: Reported on 10/08/2015 09/13/15   Ruffin Frederick, MD  desmopressin (STIMATE) 1.5 MG/ML SOLN Place 2 sprays (0.3 mg total) into the nose once. 1 spray into each nostril 24 hours post procedure Patient not taking: Reported on 10/08/2015 09/22/15   Ruffin Frederick, MD  triamcinolone (NASACORT AQ) 55 MCG/ACT AERO nasal inhaler Place 2 sprays into the nose daily. Patient not taking: Reported on 09/15/2015 12/31/14   Carmelina Dane, MD     Allergies  Allergen Reactions  . Atenolol     Caused swelling  . Cephalexin     Upset her colitis       Objective:  Physical Exam  Constitutional: She is oriented to person, place, and time. She appears well-developed and well-nourished. She is active and cooperative. No distress.  BP 128/80 mmHg  Pulse 57  Temp(Src) 98.4 F (36.9 C) (Oral)  Resp 17  Ht 5' 0.63" (1.54 m)  Wt 198 lb (89.812 kg)  BMI 37.87 kg/m2  SpO2 94%  LMP 11/26/2008   Eyes: Conjunctivae  are normal.  Pulmonary/Chest: Effort normal.    Neurological: She is alert and oriented to person, place, and time.  Psychiatric: She has a normal mood and affect. Her speech is normal and behavior is normal.           Assessment & Plan:   1. Mastitis, acute Empiric therapy for non-lactation associated mastitis is Augmentin. She'll take the probiotic again and monitor her symptoms. If she develops UC symptoms, we'll have to decide then whether to stop treatment or consider changing therapy. - amoxicillin-clavulanate (AUGMENTIN) 875-125 MG tablet; Take 1 tablet by mouth 2 (two) times daily.  Dispense: 20 tablet; Refill: 0  2. Essential hypertension Controlled. Continue metoprolol. - metoprolol succinate (TOPROL-XL) 50 MG 24 hr tablet; Take 1 tablet (50 mg total) by mouth daily.  Dispense: 90 tablet; Refill: 3   Fernande Bras, PA-C Physician Assistant-Certified [facility]

## 2015-10-08 NOTE — Patient Instructions (Signed)
Mastitis Mastitis is inflammation of the breast tissue. It occurs most often in women who are breastfeeding, but it can also affect other women, and even sometimes men. CAUSES  Mastitis is usually caused by a bacterial infection. Bacteria enter the breast tissue through cuts or openings in the skin. Typically, this occurs with breastfeeding because of cracked or irritated skin. Sometimes, it can occur even when there is no opening in the skin. It can be associated with plugged milk (lactiferous) ducts. Nipple piercing can also lead to mastitis. Also, some forms of breast cancer can cause mastitis. SIGNS AND SYMPTOMS   Swelling, redness, tenderness, and pain in an area of the breast.  Swelling of the glands under the arm on the same side.  Fever. If an infection is allowed to progress, a collection of pus (abscess) may develop. DIAGNOSIS  Your health care provider can usually diagnose mastitis based on your symptoms and a physical exam. Tests may be done to help confirm the diagnosis. These may include:   Removal of pus from the breast by applying pressure to the area. This pus can be examined in the lab to determine which bacteria are present. If an abscess has developed, the fluid in the abscess can be removed with a needle. This can also be used to confirm the diagnosis and determine the bacteria present. In most cases, pus will not be present.  Blood tests to determine if your body is fighting a bacterial infection.  Mammogram or ultrasound tests to rule out other problems or diseases. TREATMENT  Antibiotic medicine is used to treat a bacterial infection. Your health care provider will determine which bacteria are most likely causing the infection and will select an appropriate antibiotic. This is sometimes changed based on the results of tests performed to identify the bacteria, or if there is no response to the antibiotic selected. Antibiotics are usually given by mouth. You may also be  given medicine for pain. Mastitis that occurs with breastfeeding will sometimes go away on its own, so your health care provider may choose to wait 24 hours after first seeing you to decide whether a prescription medicine is needed. HOME CARE INSTRUCTIONS   Only take over-the-counter or prescription medicines for pain, fever, or discomfort as directed by your health care provider.  If your health care provider prescribed an antibiotic, take the medicine as directed. Make sure you finish it even if you start to feel better.  Do not wear a tight or underwire bra. Wear a soft, supportive bra.  Increase your fluid intake, especially if you have a fever.  Women who are breastfeeding should follow these instructions:  Continue to empty the breast. Your health care provider can tell you whether this milk is safe for your infant or needs to be thrown out. You may be told to stop nursing until your health care provider thinks it is safe for your baby. Use a breast pump if you are advised to stop nursing.  Keep your nipples clean and dry.  Empty the first breast completely before going to the other breast. If your baby is not emptying your breasts completely for some reason, use a breast pump to empty your breasts.  If you go back to work, pump your breasts while at work to stay in time with your nursing schedule.  Avoid allowing your breasts to become overly filled with milk (engorged). SEEK MEDICAL CARE IF:   You have pus-like discharge from the breast.  Your symptoms do not   Avoid allowing your breasts to become overly filled with milk (engorged).  SEEK MEDICAL CARE IF:   · You have pus-like discharge from the breast.  · Your symptoms do not improve with the treatment prescribed by your health care provider within 2 days.  SEEK IMMEDIATE MEDICAL CARE IF:   · Your pain and swelling are getting worse.  · You have pain that is not controlled with medicine.  · You have a red line extending from the breast toward your armpit.  · You have a fever or persistent symptoms for more than 2-3 days.  · You have a fever and your symptoms suddenly get worse.     This information  is not intended to replace advice given to you by your health care provider. Make sure you discuss any questions you have with your health care provider.     Document Released: 08/14/2005 Document Revised: 08/19/2013 Document Reviewed: 03/14/2013  Elsevier Interactive Patient Education ©2016 Elsevier Inc.

## 2015-10-11 ENCOUNTER — Telehealth: Payer: Self-pay | Admitting: Obstetrics and Gynecology

## 2015-10-11 NOTE — Telephone Encounter (Signed)
Thank you for the update.  Encounter has been closed.

## 2015-10-11 NOTE — Telephone Encounter (Signed)
Patient canceled her annual appt. She states she has already had her annual done at her primary doctor's office.

## 2015-10-12 ENCOUNTER — Encounter: Payer: Self-pay | Admitting: Physician Assistant

## 2015-10-12 ENCOUNTER — Ambulatory Visit (INDEPENDENT_AMBULATORY_CARE_PROVIDER_SITE_OTHER): Payer: 59 | Admitting: Physician Assistant

## 2015-10-12 VITALS — BP 128/82 | HR 72 | Temp 99.2°F | Resp 16 | Ht 59.0 in | Wt 197.0 lb

## 2015-10-12 DIAGNOSIS — N61 Mastitis without abscess: Secondary | ICD-10-CM | POA: Diagnosis not present

## 2015-10-12 MED ORDER — DOXYCYCLINE HYCLATE 100 MG PO CAPS: 100.0000 mg | ORAL_CAPSULE | Freq: Two times a day (BID) | ORAL | Status: AC

## 2015-10-12 NOTE — Progress Notes (Signed)
Patient ID: Lauren [name], female    DOB: 1960-12-24, 55 y.o.   MRN: [id number]  PCP: Lucilla Edin, MD  Subjective:   Chief Complaint  Patient presents with  . Follow-up  . mastitis  . Fever    HPI Presents for evaluation of mastitis.  Seen here on 09/17/2015 with similar symptoms, but had waited longer before seeking evaluation. At the time, the pain was associated with redness and swelling of the lateral LEFT breast. Mammogram the week prior was normal. No recalled injury or trauma to the area.  She took a course of doxycycline, and while she forgot the second dose on 3 of the days, she took all the tablets prescribed (#20) and had complete resolution of the symptoms.  Symptoms recurred on 10/07/2015, and she presented before they got as bad.  There was no redness or swelling the second time. She was started on Augmentin, empiric therapy for non-lactation associated mastitis, which she has tolerated well along with the probiotic, but has not improved. Developed fever, Tmax 101.5, and chills 2 days ago. Thought she was getting the flu. No cough. No ear ache. Has a mild cold, and chronic cough, but not worse than usual.  "Just not improving. May be a little more tender, in a larger area." Maybe looks a little red when she examines it in the mornings.  Is using the warm compresses once daily, usually in the evenings, without any perceived benefit.   Review of Systems As above.    Patient Active Problem List   Diagnosis Date Noted  . Ulcerative colitis with complication (HCC)   . Benign neoplasm of colon 07/29/2013  . Von Willebrand disease (HCC) 07/17/2013  . Osteoporosis 04/04/2013  . VITAMIN B12 DEFICIENCY 04/05/2010  . SINOATRIAL NODE DYSFUNCTION 10/15/2009  . Reactive depression 09/17/2009  . HEMORRHOIDS-EXTERNAL 05/18/2009  . Anxiety state 02/26/2008  . Essential hypertension 02/26/2008  . Ulcerative colitis (HCC) 02/26/2008  . COUGH, CHRONIC 02/26/2008     Prior  to Admission medications   Medication Sig Start Date End Date Taking? Authorizing Provider  amoxicillin-clavulanate (AUGMENTIN) 875-125 MG tablet Take 1 tablet by mouth 2 (two) times daily. 10/08/15 10/18/15 Yes Eriyah Fernando, PA-C  Calcium Carbonate-Vit D-Min (CALCIUM 1200) 1200-1000 MG-UNIT CHEW Chew 1 tablet by mouth daily.   Yes Historical Provider, MD  fexofenadine (ALLEGRA) 180 MG tablet Take 180 mg by mouth daily.   Yes Historical Provider, MD  FLUoxetine (PROZAC) 20 MG capsule TAKE ONE CAPSULE BY MOUTH DAILY 10/07/15  Yes Goro Wenrick, PA-C  fluticasone (FLONASE) 50 MCG/ACT nasal spray Place 1 spray into both nostrils daily.   Yes Historical Provider, MD  mesalamine (LIALDA) 1.2 G EC tablet Take 4 tablets (4.8 g total) by mouth daily. 07/05/15  Yes Gwenlyn Found Copland, MD  metoprolol succinate (TOPROL-XL) 50 MG 24 hr tablet Take 1 tablet (50 mg total) by mouth daily. 10/08/15  Yes Ronnell Makarewicz, PA-C  potassium chloride SA (K-DUR,KLOR-CON) 20 MEQ tablet Take one by mouth every 1-2 days Patient taking differently: Take 20 mEq by mouth every other day.  07/06/15  Yes Gwenlyn Found Copland, MD  saccharomyces boulardii (FLORASTOR) 250 MG capsule Take 1 capsule (250 mg total) by mouth 2 (two) times daily. 09/17/15  Yes Morrell Riddle, PA-C  triamcinolone (NASACORT AQ) 55 MCG/ACT AERO nasal inhaler Place 2 sprays into the nose daily. 12/31/14  Yes Carmelina Dane, MD     Allergies  Allergen Reactions  . Atenolol  Caused swelling  . Cephalexin     Upset her colitis       Objective:  Physical Exam  Constitutional: She is oriented to person, place, and time. She appears well-developed and well-nourished. She is active and cooperative. No distress.  BP 128/82 mmHg  Pulse 72  Temp(Src) 99.2 F (37.3 C)  Resp 16  Ht 4\' 11"  (1.499 m)  Wt 197 lb (89.359 kg)  BMI 39.77 kg/m2  LMP 11/26/2008   Eyes: Conjunctivae are normal.  Pulmonary/Chest: Effort normal. Left breast exhibits mass and  tenderness. Left breast exhibits no inverted nipple, no nipple discharge and no skin change. Breasts are symmetrical.    Neurological: She is alert and oriented to person, place, and time.  Psychiatric: She has a normal mood and affect. Her speech is normal and behavior is normal.           Assessment & Plan:   1. Mastitis, acute Stop the Augmentin, as it is ineffective. Restart doxycycline. Proceed with LEFT breast US to assess for abscess formation. If not having significant improvement in 48 hours, or if worsens, RTC. Increase warm compress use. - US BREAST LTD UNI LEFT INC AXILLA; Future - doxycycline (VIBRAMYCIN) 100 MG capsule; Take 1 capsule (100 mg total) by mouth 2 (two) times daily.  Dispense: 20 capsule; Refill: 0   Fernande Bras, PA-C Physician Assistant-Certified [facility]

## 2015-10-12 NOTE — Patient Instructions (Signed)
Stop the Augmentin. Restart the doxycycline. Continue the probiotic.

## 2015-10-13 ENCOUNTER — Telehealth: Payer: Self-pay

## 2015-10-13 NOTE — Telephone Encounter (Signed)
Pt has questions about the breast referral she is having done [phone] til 5 then [phone]

## 2015-10-14 ENCOUNTER — Telehealth: Payer: Self-pay | Admitting: Family Medicine

## 2015-10-14 ENCOUNTER — Other Ambulatory Visit: Payer: Self-pay | Admitting: Family Medicine

## 2015-10-14 ENCOUNTER — Ambulatory Visit: Payer: BLUE CROSS/BLUE SHIELD | Admitting: Obstetrics and Gynecology

## 2015-10-14 DIAGNOSIS — N61 Mastitis without abscess: Secondary | ICD-10-CM

## 2015-10-14 NOTE — Telephone Encounter (Signed)
[facility] stated that the order for the Dx mammogram should be put in as [facility] Dx mammogram, which I took care of. I am forwarding this for your review. Thanks

## 2015-10-14 NOTE — Telephone Encounter (Signed)
Returned call to pt to find out question she had. She stated that protocol for u/s mammogram must include Diagnostic mammogram. She is requesting to have these appts be made for today or the following day, no later as she is "very concerned".

## 2015-10-14 NOTE — Telephone Encounter (Signed)
Spoke with pt to inform her that 2nd referral was put in for Dx mammogram.

## 2015-10-15 ENCOUNTER — Ambulatory Visit
Admission: RE | Admit: 2015-10-15 | Discharge: 2015-10-15 | Disposition: A | Discharge status: Home / Self Care | Payer: 59 | Source: Ambulatory Visit | Attending: Physician Assistant | Admitting: Physician Assistant

## 2015-10-15 DIAGNOSIS — N61 Mastitis without abscess: Secondary | ICD-10-CM

## 2015-10-20 ENCOUNTER — Other Ambulatory Visit: Payer: 59

## 2015-12-09 ENCOUNTER — Other Ambulatory Visit: Payer: Self-pay | Admitting: *Deleted

## 2015-12-09 DIAGNOSIS — R109 Unspecified abdominal pain: Secondary | ICD-10-CM

## 2015-12-19 ENCOUNTER — Ambulatory Visit (INDEPENDENT_AMBULATORY_CARE_PROVIDER_SITE_OTHER): Payer: 59 | Admitting: Emergency Medicine

## 2015-12-19 VITALS — BP 114/78 | HR 70 | Temp 98.6°F | Resp 16 | Ht 59.0 in | Wt 198.0 lb

## 2015-12-19 DIAGNOSIS — J029 Acute pharyngitis, unspecified: Secondary | ICD-10-CM | POA: Diagnosis not present

## 2015-12-19 DIAGNOSIS — R059 Cough, unspecified: Secondary | ICD-10-CM

## 2015-12-19 DIAGNOSIS — J040 Acute laryngitis: Secondary | ICD-10-CM

## 2015-12-19 DIAGNOSIS — J301 Allergic rhinitis due to pollen: Secondary | ICD-10-CM | POA: Diagnosis not present

## 2015-12-19 DIAGNOSIS — R05 Cough: Secondary | ICD-10-CM

## 2015-12-19 LAB — POCT RAPID STREP A (OFFICE): Rapid Strep A Screen: NEGATIVE

## 2015-12-19 MED ORDER — BENZONATATE 100 MG PO CAPS: 100.0000 mg | ORAL_CAPSULE | Freq: Three times a day (TID) | ORAL | Status: DC | PRN

## 2015-12-19 MED ORDER — AZITHROMYCIN 250 MG PO TABS: ORAL_TABLET | ORAL | Status: DC

## 2015-12-19 NOTE — Progress Notes (Addendum)
By signing my name below, I, Raven Small, attest that this documentation has been prepared under the direction and in the presence of Nena Jordan, MD.  Electronically Signed: Thea Alken, ED Scribe. 12/19/2015. 2:25 PM.  Chief Complaint:  Chief Complaint  Patient presents with  . Cough    productive cough, sore throat, post nasal drainage, ear pain x 1 week ago   . Hoarse    x 6 days     HPI: Lauren [name] is a 55 y.o. female who reports to [facility] today complaining of productive cough. Pt states symptoms started with nasal congestion and post nasal drip 6 days. A couple days later she's developed a productive cough, sore throat, hoarseness and a low grade fever. Pt states cough is now somewhat productive with some mucous and nasal drainage is yellow. She has been taking allergy pill at night, nettie pot and honey without relief to symptoms. She denies sinus pressure, and headaches.    Past Medical History  Diagnosis Date  . Ulcerative colitis   . SVT (supraventricular tachycardia) ([place])   . Personal history of meningioma of the brain   . Von Willebrands disease ([place])   . Anxiety and depression   . Vitamin B12 deficiency   . Tennis elbow   . MRSA (methicillin resistant Staphylococcus aureus)   . Hypertension   . Osteopenia   . Clotting disorder ([place])   . Anemia   . Internal hemorrhoids   . Inflammatory polyps of colon ([place])   . Hearing loss     right ear  . Cellulitis of left breast 09/17/2015    [facility]- on ABX   Past Surgical History  Procedure Laterality Date  . Implantation bone anchored hearing aid Right   . Cardiac electrophysiology study and ablation  10/22/09  . Colonoscopy N/A 07/29/2013    Procedure: COLONOSCOPY;  Surgeon: Lafayette Dragon, MD;  Location: [facility];  Service: Endoscopy;  Laterality: N/A;  . Wisdom tooth extraction    . Brain meningioma excision  05/2007  . Colonoscopy with propofol N/A 09/21/2015    Procedure: COLONOSCOPY WITH  PROPOFOL;  Surgeon: Manus Gunning, MD;  Location: [facility] ENDOSCOPY;  Service: Gastroenterology;  Laterality: N/A;   Social History   Social History  . Marital Status: Married    Spouse Name: N/A  . Number of Children: 1  . Years of Education: N/A   Occupational History  . Production assistant, radio   . ADM ASSISTANT    Social History Main Topics  . Smoking status: Never Smoker   . Smokeless tobacco: Never Used  . Alcohol Use: No  . Drug Use: No  . Sexual Activity: No   Other Topics Concern  . None   Social History Narrative   Family History  Problem Relation Age of Onset  . Heart disease Maternal Grandfather   . Heart disease Mother   . Stroke Mother   . Diabetes Maternal Uncle   . Colon cancer Neg Hx    Allergies  Allergen Reactions  . Atenolol     Caused swelling  . Cephalexin     Upset her colitis   Prior to Admission medications   Medication Sig Start Date End Date Taking? Authorizing Provider  Calcium Carbonate-Vit D-Min (CALCIUM 1200) 1200-1000 MG-UNIT CHEW Chew 1 tablet by mouth daily.   Yes Historical Provider, MD  fexofenadine (ALLEGRA) 180 MG tablet Take 180 mg by mouth daily.   Yes Historical Provider, MD  FLUoxetine (PROZAC) 20 MG capsule TAKE ONE CAPSULE BY MOUTH DAILY 10/07/15  Yes Chelle Jeffery, PA-C  fluticasone (FLONASE) 50 MCG/ACT nasal spray Place 1 spray into both nostrils daily.   Yes Historical Provider, MD  mesalamine (LIALDA) 1.2 G EC tablet Take 4 tablets (4.8 g total) by mouth daily. 07/05/15  Yes Gay Filler Copland, MD  metoprolol succinate (TOPROL-XL) 50 MG 24 hr tablet Take 1 tablet (50 mg total) by mouth daily. 10/08/15  Yes Chelle Jeffery, PA-C  potassium chloride SA (K-DUR,KLOR-CON) 20 MEQ tablet Take one by mouth every 1-2 days Patient taking differently: Take 20 mEq by mouth every other day.  07/06/15  Yes Gay Filler Copland, MD     ROS: The patient denies night sweats, unintentional weight loss, chest pain, palpitations,  wheezing, dyspnea on exertion, nausea, vomiting, abdominal pain, dysuria, hematuria, melena, numbness, weakness, or tingling.   All other systems have been reviewed and were otherwise negative with the exception of those mentioned in the HPI and as above.    PHYSICAL EXAM: Filed Vitals:   12/19/15 1414  BP: 114/78  Pulse: 70  Temp: 98.6 F (37 C)  Resp: 16   Body mass index is 39.97 kg/(m^2).   General: Alert, no acute distress HEENT:  Normocephalic, atraumatic, oropharynx patent. Eye: [facility] Cardiovascular:  Regular rate and rhythm, no rubs murmurs or gallops.  No Carotid bruits, radial pulse intact. No pedal edema.  Respiratory: Clear to auscultation bilaterally.  No wheezes, rales, or rhonchi.  No cyanosis, no use of accessory musculature Abdominal: No organomegaly, abdomen is soft and non-tender, positive bowel sounds.  No masses. Musculoskeletal: Gait intact. No edema, tenderness Skin: No rashes. Neurologic: Facial musculature symmetric. Psychiatric: Patient acts appropriately throughout our interaction. Lymphatic: No cervical or submandibular lymphadenopathy   LABS: Results for orders placed or performed in visit on 12/19/15  POCT rapid strep A  Result Value Ref Range   Rapid Strep A Screen Negative Negative    ASSESSMENT/PLAN: Gave patient a prescription for Zithromax but told her not to get it filled. I told her to keep it for 2-3 days and only fill if she was not improving. She will continue on Zyrtec and Flonase. I did send in a prescription for Tessalon Perles.I personally performed the services described in this documentation, which was scribed in my presence. The recorded information has been reviewed and is accurate.   Gross sideeffects, risk and benefits, and alternatives of medications d/w patient. Patient is aware that all medications have potential sideeffects and we are unable to predict every sideeffect or drug-drug interaction that may  occur.  Arlyss Queen MD 12/19/2015 2:25 PM

## 2015-12-19 NOTE — Patient Instructions (Signed)
     IF you received an x-ray today, you will receive an invoice from [facility]. Please contact [facility] at [phone] with questions or concerns regarding your invoice.   IF you received labwork today, you will receive an invoice from [facility]. Please contact [facility] at [phone] with questions or concerns regarding your invoice.   Our billing staff will not be able to assist you with questions regarding bills from these companies.  You will be contacted with the lab results as soon as they are available. The fastest way to get your results is to activate your My Chart account. Instructions are located on the last page of this paperwork. If you have not heard from us regarding the results in 2 weeks, please contact this office.      

## 2016-02-28 ENCOUNTER — Telehealth: Payer: Self-pay | Admitting: Gastroenterology

## 2016-02-28 NOTE — Telephone Encounter (Signed)
Spoke with patient and she reports for the last 2 weeks, she has had increased gas, feeling different and tissue like stuff in stool. Reports some diarrhea. She increased her Lialda to 4 tablets/day about 5-6 days ago and is taking Publishing copy. Today, she had mucous in stool. Denies bleeding. Please, advise.

## 2016-03-01 MED ORDER — MESALAMINE 4 G RE ENEM: 4.0000 g | ENEMA | Freq: Every day | RECTAL | Status: DC

## 2016-03-01 NOTE — Telephone Encounter (Signed)
Patient notified and she states she is having flare symptoms that are typical for her. Rx sent to pharmacy for Rowasa enemas.

## 2016-03-01 NOTE — Telephone Encounter (Signed)
Lauren [name] if symptoms are persisting on Lialda 4.8gm daily, she can try adding Rowasa enema qHS. Are these her typical colitis symptoms or something new? If she has significant diarrhea we should send stools for C Diff and obtain baseline CBC. thanks

## 2016-03-13 NOTE — H&P (Signed)
Lauren [name] is an 55 y.o. female.   Chief Complaint: 1. Single sided deafness, right ear, s/p extruded BAHA implant, 2. Von Willebrand's disease, type I HPI: See H&P below  History and Physical Examination  Patient:  Lauren [name]  Date of Birth: [birth year]  Provider: Vicie Mutters, MD, MS, FACS  Date of Service:  [name] 05, 2016  Location: [facility], [place], [place]                  [place], [place]   [id number]                                Ph: [phone], Fax: [phone]                  [email or web address]     Provider: Vicie Mutters, MD, MS, FACS Encounter Date: [name] 05, 2016 Patient: Lauren [name], Lauren [name]   [id number]) Sex: Female       DOB: Apr 07, 1961      Age: 61 year 10 month 1 week       Race: White Address: [address]  [place]  [address]    Pref. Phone(H): [phone] Primary Dr.: [facility] Insurance(s):  [employer]([phone]) (PP)   [place] CT: Type: procedure  code: [phone]  desc:Documentation of current medications (procedure)  Visit Type: [name], a 2 year 20 month 1 week White female is here today for a return visit.  Complaint/HPI: The patient was here today for follow-up after developing an infected right Baha implant site, originally performed by Dr. Benjamine Mola, using a skin graft technique, and extruding her right Baha implant. She is feeling better and tolerated the antibiotic. She denies any drainage or pain. Her right scalp culture grew mixed skin flora.  Previous history: The patient was here today complaining of some discomfort when she removes her right Baha sound processor and applies the sound processor to her abutment. It is a dull ache and a twinge of pain. She denied any drainage from around the abutment, fever, or other symptoms.  Previous history: The patient returns today for follow-up. She has no complaints. She had been treated for a suspected cMRSA  infection around her right Baha implant. She has no symptoms of otorrhea, otalgia, tinnitus or vertigo.  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 Her culture showed no growth after 48 hours. The patient has inflammatory bowel disease and needs to restart Remicade. She is asymptomatic for any right ear pain, or pain around her Baha implant. Her original Baha implant was performed by Dr. Benjamine Mola, not myself.  Previous history: The patient has developed some pain and discomfort around her right Baha implant. She was culture positive for cMRSA and responded to doxycycline and Bactroban ointment. She has now relapsed. She contacted Dr. Benjamine Mola during the weekend. She has no other symptoms.  Previous history: The patient returns today for follow-up after being treated with doxycycline and Bactroban for a cMRSA infection involving her right Baha implant. She tolerated the doxycycline other than some skin sensitivity to UV light. She was to receive a dose of Remicade but this has been delayed because of the MRSA infection.  Previous history: The  patient was here today complaining of tenderness around her right Baha implant that was implanted by Dr. Benjamine Mola in July, 2009. The patient has colitis and is scheduled to have a remicade infusion next week. She is unable to take oral Keflex.  Previous history: The patient is here today for evaluation of her right Baha hearing implant. She has been doing well. Her processor seems to be malfunctioning and is buzzing. She needs to obtain a new processor, either a Baha or [employer].  Previous history: The patient was here today for follow-up of her right Baha hearing implant. She has no complaints. She does have residual numbness of her right scalp. She is deriving benefit from the implant. She denies any otorrhea or otalgia. She has been released from neurosurgery concerning  her meningioma.   Current Medication: 1. Fluoxetine 20 Mg Capsule (Other MD)  2. Metoprolol Succ Er 50 Mg Tab (Other MD)  3. Multiple Vitamin Tablet (Other MD)  4. Potassium Cl Er 10 Meq Capsule (Other MD)  5. Vit D2 1.25 Mg (50,000 Unit) (Other MD)  6. Lialda Dr 1.2 Gm Tablet (Other MD)   Medical History: Vaccinations: Flu vaccinations: .  Surgical History: Prior surgeries include bleeding disorder, BAHA, heart ablation, brain surgery.  Gastroenterology: . Ulcerative Colitis..  Family History: The patient has a family history of Diabetes mellitus, Heart disease and HBP and stroke.  Social History: No  Adult. Smoking: Her current smoking status is never smoker/non-smoker. Alcohol: Patient denies drinking alcoholic beverages. Marital Status: Patient is married. Noise exposure: Patient denies noise exposure. Recreational Drug Use: She denies recreational drug use.  Allergy: Betalactams, Cephalosporins  ROS: General: (-) fever, (-) chills, (-) night sweats, (-) fatigue, (-) weakness, (-) changes in appetite or weight. (+) immunocompromised. Head: (-) headaches, (-) head injury or deformity. Eyes: (-) visual changes, (-) eye pain, (-) eye discharges, (-) redness, (-) itching, (-) excessive tearing, (-) double or blurred vision, (-) glaucoma, (-) cataracts. Ears: (+) deafness. Nose and Sinuses: (-) frequent colds, (-) nasal stuffiness or itchiness, (-) postnasal drip, (-) hay fever, (-) nosebleeds, (-) sinus trouble. Mouth and Throat: (-) bleeding gums, (-) toothache, (-) odd taste sensations, (-) sores on tongue, (-) frequent sore throat, (-) hoarseness. Neck: (-) swollen glands, (-) enlarged thyroid, (-) neck pain. Cardiac: (+) high blood pressure, (+) irregular heartbeat. Respiratory: (-) cough, (-) hemoptysis, (-) shortness of breath, (-) cyanosis, (-) wheezing, (-) nocturnal choking or gasping, (-) TB exposure. Breasts: (-) nipple discharge, (-) breast lumps, (-) breast  pain. Gastrointestinal: (+) ulcers. Urinary: (-) dysuria, (-) frequency, (-) urgency, (-) hesitancy, (-) polyuria, (-) nocturia, (-) hematuria, (-) urinary incontinence, (-) flank pain, (-) change in urinary habits. Gynecologic/Urologic: (-) genital sores or lesions, (-) history of STD, (-) sexual difficulties. Musculoskeletal: (+) osteopenia. Peripheral Vascular: (-) intermittent claudication, (-) cramps, (-) varicose veins, (-) thrombophlebitis. Neurological: (-) numbness, (-) tingling, (-) tremors, (-) seizures, (-) vertigo, (-) dizziness, (-) memory loss, (-) any focal or diffuse neurological deficits. Psychiatric: (-) anxiety, (-) depression, (-) sleep disturbance, (-) irritability, (-) mood swings, (-) suicidal thoughts or ideations. Endocrine: (-) heat or cold intolerance, (-) excessive sweating, (-) diabetes, (-) excessive thirst, (-) excessive hunger, (-) excessive urination, (-) hirsutism, (-) change in ring or shoe size. Hematologic/Lymphatic: (+) bleeding. Skin: (+) dermatitis.  Vital Signs: Weight:   88.904 kgs, note given to patient Height:   4\' 12"  BMI:   38.27 BSA:   1.94 BP:   151/82  Examination: Prior to  the examination, I have reviewed: (1) the patient's current medications and allergies, (2) medical, family, and social histories, (3) review of systems, and (4) vital signs.  General Appearance - Adult: The patient is a well-developed, well-nourished, female, has no recognizable syndromes or patterns of malformation, and is in no acute distress. She is awake, alert, coherent, spontaneous, and logical. She is oriented to time, place, and person and communicates without difficulty.  Head: The skin graft site is well healed. The extruded implant site was cleaned. It has filled in with some soft tissue medially. There is still a small divot. There is no exposed bone. Infection has resolved.  Face: Her facial motion was intact and symmetric bilaterally with normal resting  facial tone and voluntary facial power.  Skin: Gross inspection of her facial skin demonstrated no evidence of abnormality.  Eyes: Her pupils are equal, regular, reactive to light and accommodate (PERRLA). Extraocular movements were intact (EOMI). Conjunctivae were normal. There was no sclera icterus. There was no nystagmus. Eyelids appeared normal. There was no ptosis, lid lag, lid edema, or lagophthalmos.  External ears: Both of her external ears were normal in size, shape, angulation, and location.  External auditory canals: Her external auditory canal was normal in diameter and had intact, healthy skin. There were no signs of infection, exposed bone, or canal cholesteatoma. Minimal cerumen was removed to facilitate examination.  Audiology Procedures: Auditory Brainstem Response Test: Procedure:  The patient was referred for ABR testing by Dr. [name]. After the patient was sedated, at rest or asleep, an electrode was placed on the scalp at the vertex, and a reference electrode was placed on the earlobe. Headphones were applied. Auditory stimulation was initiated, and the brainstem response was recorded as waveform activity. The averaging computer in the ABR unit then analyzed the information and it was compared to normal responses. The patient was found to have a dead ear on the right with an SAT of 105 DB. She had a mild high-frequency sensor hearing loss in the left ear with an SRT of 15 DB and 88% discrimination.  Impression: Other:  1. Healed right Baha implant site with extrusion of the Baha implant. Culture grew mixed skin flora. 2. Residual scalp numbness secondary to number one above. 3. Dead ear AD. 4. Will need to contact hematology at [facility] for their recommendations concerning managing her Von Willebrand's during the procedure. 5. The patient has a history of von Willebrand's disease and will require DDAVP intraoperatively. 6. The patient is on Remicade 7. Schedule for  revision BAHA implant AD. Patient wants to schedule in June 2017. 8. Will need to have a preop audiogram.  Plan: Clinical summary letter made available to patient today. This letter may not be complete at time of service. Please contact our office within 3 days for a completed summary of today's visit.  Status: stable and Improved. Medications: None required and Mometasone cream 0.1%: Apply to affected area twice per day for 1 week. Diet: no restriction. Procedure: Revision R BAHA hearing implant.  Recommendations: Follow-Up: Preoperative visit.  Diagnosis: H90.A21  Sensorineural hearing loss, unilateral, right ear, with restricted hearing on the contralateral side   Careplan: (1) Hearing Loss  Followup: Postop visit   This visit note has been electronically signed off by Vicie Mutters, MD, MS, FACS on 01/05/2016 at 07:04 PM.       Next Appointment: 03/14/2016 at 04:35 PM     Past Medical History  Diagnosis Date  . Ulcerative colitis   .  SVT (supraventricular tachycardia) ([place])   . Personal history of meningioma of the brain   . Von Willebrands disease ([place])   . Anxiety and depression   . Vitamin B12 deficiency   . Tennis elbow   . MRSA (methicillin resistant Staphylococcus aureus)   . Hypertension   . Osteopenia   . Clotting disorder ([place])   . Anemia   . Internal hemorrhoids   . Inflammatory polyps of colon ([place])   . Hearing loss     right ear  . Cellulitis of left breast 09/17/2015    diagnosed at Urgent Care- on ABX    Past Surgical History  Procedure Laterality Date  . Implantation bone anchored hearing aid Right   . Cardiac electrophysiology study and ablation  10/22/09  . Colonoscopy N/A 07/29/2013    Procedure: COLONOSCOPY;  Surgeon: Lafayette Dragon, MD;  Location: [facility];  Service: Endoscopy;  Laterality: N/A;  . Wisdom tooth extraction    . Brain meningioma excision  05/2007  . Colonoscopy with propofol N/A 09/21/2015    Procedure: COLONOSCOPY  WITH PROPOFOL;  Surgeon: Manus Gunning, MD;  Location: [facility];  Service: Gastroenterology;  Laterality: N/A;    Family History  Problem Relation Age of Onset  . Heart disease Maternal Grandfather   . Heart disease Mother   . Stroke Mother   . Diabetes Maternal Uncle   . Colon cancer Neg Hx    Social History:  reports that she has never smoked. She has never used smokeless tobacco. She reports that she does not drink alcohol or use illicit drugs.  Allergies:  Allergies  Allergen Reactions  . Atenolol     Caused swelling  . Cephalexin     Upset her colitis    No prescriptions prior to admission    No results found for this or any previous visit (from the past 48 hour(s)). No results found.  Review of Systems  Constitutional: Negative.   HENT: Positive for hearing loss.   Eyes: Negative.   Respiratory: Negative.   Cardiovascular: Negative.   Gastrointestinal:       History of ulcerative colitis on imuran  Genitourinary: Negative.   Musculoskeletal: Negative.   Skin: Negative.   Neurological: Negative.   Endo/Heme/Allergies:       + Von Willebrand's disease, type I  Psychiatric/Behavioral: Negative.     Last menstrual period 11/26/2008. Physical Exam   Assessment/Plan 1. Single sided deafness, right ear, s/p extruded BAHA implant ( Dr. Benjamine Mola) 2. Von Willebrand's disease, type I. The patient will require DDAVP 26 mcg in 50 ml of normal saline, infused over 20 minutes prior to the incision. I have discussed and cleared the DDAVP treatment with Dr. Burney Gauze of Hematology-Oncology.  3. Proceed with a revision R BAHA hearing implant via a linear incision technique, 1 hour, [facility], Wed, 03-15-16, general anesthesia, OP. Risks, complications and alternatives have been explained to the patient. Questions have been invited and answered. Informed consent has been signed and witnessed. Preop teaching and couseling have been provided.  Fannie Knee,  MD 03/13/2016, 6:01 PM

## 2016-03-14 ENCOUNTER — Encounter (HOSPITAL_COMMUNITY): Payer: Self-pay

## 2016-03-14 ENCOUNTER — Encounter (HOSPITAL_COMMUNITY)
Admission: RE | Admit: 2016-03-14 | Discharge: 2016-03-14 | Disposition: A | Discharge status: Home / Self Care | Payer: 59 | Source: Ambulatory Visit | Attending: Otolaryngology | Admitting: Otolaryngology

## 2016-03-14 DIAGNOSIS — H918X1 Other specified hearing loss, right ear: Secondary | ICD-10-CM | POA: Diagnosis present

## 2016-03-14 DIAGNOSIS — Z8614 Personal history of Methicillin resistant Staphylococcus aureus infection: Secondary | ICD-10-CM | POA: Diagnosis not present

## 2016-03-14 DIAGNOSIS — H9191 Unspecified hearing loss, right ear: Secondary | ICD-10-CM | POA: Diagnosis not present

## 2016-03-14 DIAGNOSIS — I10 Essential (primary) hypertension: Secondary | ICD-10-CM | POA: Diagnosis not present

## 2016-03-14 DIAGNOSIS — D680 Von Willebrand's disease: Secondary | ICD-10-CM | POA: Diagnosis not present

## 2016-03-14 HISTORY — DX: Depression, unspecified: F32.A

## 2016-03-14 HISTORY — DX: Urgency of urination: R39.15

## 2016-03-14 HISTORY — DX: Major depressive disorder, single episode, unspecified: F32.9

## 2016-03-14 HISTORY — DX: Personal history of colon polyps, unspecified: Z86.0100

## 2016-03-14 HISTORY — DX: Personal history of colonic polyps: Z86.010

## 2016-03-14 HISTORY — DX: Other seasonal allergic rhinitis: J30.2

## 2016-03-14 HISTORY — DX: Hypokalemia: E87.6

## 2016-03-14 HISTORY — DX: Pneumonia, unspecified organism: J18.9

## 2016-03-14 LAB — CBC
HCT: 41.3 % (ref 36.0–46.0)
Hemoglobin: 14.1 g/dL (ref 12.0–15.0)
MCH: 29.5 pg (ref 26.0–34.0)
MCHC: 34.1 g/dL (ref 30.0–36.0)
MCV: 86.4 fL (ref 78.0–100.0)
Platelets: 244 10*3/uL (ref 150–400)
RBC: 4.78 MIL/uL (ref 3.87–5.11)
RDW: 13.8 % (ref 11.5–15.5)
WBC: 6.2 10*3/uL (ref 4.0–10.5)

## 2016-03-14 LAB — BASIC METABOLIC PANEL
Anion gap: 9 (ref 5–15)
BUN: 9 mg/dL (ref 6–20)
CO2: 24 mmol/L (ref 22–32)
Calcium: 9.3 mg/dL (ref 8.9–10.3)
Chloride: 105 mmol/L (ref 101–111)
Creatinine, Ser: 0.69 mg/dL (ref 0.44–1.00)
GFR calc Af Amer: >60 mL/min (ref >60)
GFR calc non Af Amer: >60 mL/min (ref >60)
Glucose, Bld: 105 mg/dL — ABNORMAL HIGH (ref 65–99)
Potassium: 3.4 mmol/L — ABNORMAL LOW (ref 3.5–5.1)
Sodium: 138 mmol/L (ref 135–145)

## 2016-03-14 NOTE — Progress Notes (Signed)
Cardiologist is Dr.Henry Tamala Julian with last visit around 2012  Sees Chelle Frystown PA with [facility]  Stress test report in epic from 1999  Echo done around [place] cath denies  EKG in epic from 07-05-15  CXR denies in past yr

## 2016-03-14 NOTE — Pre-Procedure Instructions (Signed)
RAYMA HEITZMAN  03/14/2016      [facility] - [place], [place] - [address] [place] [address] [place] [address] Phone: [phone] Fax: [phone]  [place], [place] - [id number] [place] [place] [address] Phone: [phone] Fax: [phone]  [place] [address], [place] [id number] [place] [id number] Phone: [phone] Fax: [phone]  [facility] [place], [place] - [address] [place] [address] Phone: [phone] Fax: [place], [place] [address] Phone: [phone] Fax: [phone]    Your procedure is scheduled on Wed, July 19 @ 8:30 AM  Report to [place] at 6:30 AM  Call this number if you have problems the morning of surgery:  [phone]   Remember:  Do not eat food or drink liquids after midnight.  Take these medicines the morning of surgery with A SIP OF WATER Allegra(Fexofenadine),Prozac(Fluoxetine),Flonase(Fluticasone),and Metoprolol(Toprol)             No Goody's,BC's,Aleve,Aspirin,Ibuprofen,Motrin,Advil,Fish Oil,or any Herbal Medications.    Do not wear jewelry, make-up or nail polish.  Do not wear lotions, powders, or perfumes.    Do not shave 48 hours prior to surgery.    Do not bring valuables to the hospital.  [facility] is not responsible for any belongings or valuables.  Contacts, dentures or bridgework may not be worn into surgery.  Leave your suitcase in the car.  After surgery it may be brought to your room.  For patients admitted to the hospital, discharge time will be determined by your treatment team.  Patients discharged the day of surgery will not be allowed to drive home.    [facility]  Before surgery, you can play an important role.  Because skin is not sterile, your skin needs to be as free of germs as possible.  You can reduce the number of germs on you skin by washing with CHG (chlorahexidine gluconate) soap before surgery.  CHG is an antiseptic cleaner which kills germs and bonds with the skin to continue killing germs even after washing.  Please DO NOT use if you have an allergy to CHG or antibacterial soaps.  If your skin becomes reddened/irritated stop using the CHG and inform your nurse when you arrive at Short Stay.  Do not shave (including legs and underarms) for at least 48 hours prior to the first CHG shower.  You may shave your face.  Please follow these instructions carefully:   1.  Shower with CHG Soap the night before surgery and the                                morning of Surgery.  2.  If you choose to wash your hair, wash your hair first as usual with your       normal shampoo.  3.  After you shampoo, rinse your hair and body thoroughly to remove the                      Shampoo.  4.  Use CHG as you would any  other liquid soap.  You can apply chg directly       to the skin and wash gently with scrungie or a clean washcloth.  5.  Apply the CHG Soap to your body ONLY FROM THE NECK DOWN.        Do not use on open wounds or open sores.  Avoid contact with your eyes,       ears, mouth and genitals (private parts).  Wash genitals (private parts)       with your normal soap.  6.  Wash thoroughly, paying special attention to the area where your surgery        will be performed.  7.  Thoroughly rinse your body with warm water from the neck down.  8.  DO NOT shower/wash with your normal soap after using and rinsing off       the CHG Soap.  9.  Pat yourself dry with a clean towel.            10.  Wear clean pajamas.            11.  Place clean sheets on your bed the night of your first shower and do not         sleep with pets.  Day of Surgery  Do not apply any lotions/deoderants the morning of surgery.  Please wear clean clothes to the hospital/surgery center.    Please read over the following fact sheets that you were given. Pain Booklet, Coughing and Deep Breathing and Surgical Site Infection Prevention

## 2016-03-15 ENCOUNTER — Ambulatory Visit (HOSPITAL_COMMUNITY): Payer: 59 | Admitting: Certified Registered"

## 2016-03-15 ENCOUNTER — Ambulatory Visit (HOSPITAL_COMMUNITY)
Admission: RE | Admit: 2016-03-15 | Discharge: 2016-03-15 | Disposition: A | Discharge status: Home / Self Care | Payer: 59 | Source: Ambulatory Visit | Attending: Otolaryngology | Admitting: Otolaryngology

## 2016-03-15 ENCOUNTER — Encounter (HOSPITAL_COMMUNITY): Payer: Self-pay | Admitting: *Deleted

## 2016-03-15 ENCOUNTER — Encounter (HOSPITAL_COMMUNITY)
Admission: RE | Disposition: A | Discharge status: Home / Self Care | Payer: Self-pay | Source: Ambulatory Visit | Attending: Otolaryngology

## 2016-03-15 DIAGNOSIS — I10 Essential (primary) hypertension: Secondary | ICD-10-CM | POA: Insufficient documentation

## 2016-03-15 DIAGNOSIS — Z8614 Personal history of Methicillin resistant Staphylococcus aureus infection: Secondary | ICD-10-CM | POA: Insufficient documentation

## 2016-03-15 DIAGNOSIS — D680 Von Willebrand's disease: Secondary | ICD-10-CM | POA: Insufficient documentation

## 2016-03-15 DIAGNOSIS — H9191 Unspecified hearing loss, right ear: Secondary | ICD-10-CM | POA: Insufficient documentation

## 2016-03-15 HISTORY — PX: BAHA REVISION: SHX5325

## 2016-03-15 SURGERY — REVISION, BONE ANCHORED HEARING AID
Anesthesia: Monitor Anesthesia Care | Site: Ear | Laterality: Right

## 2016-03-15 MED ORDER — LIDOCAINE HCL (CARDIAC) 20 MG/ML IV SOLN
INTRAVENOUS | Status: DC | PRN
  Administered 2016-03-15: 80 mg via INTRATRACHEAL

## 2016-03-15 MED ORDER — PROPOFOL 10 MG/ML IV BOLUS
INTRAVENOUS | Status: AC
  Filled 2016-03-15: qty 20

## 2016-03-15 MED ORDER — DEXAMETHASONE SODIUM PHOSPHATE 4 MG/ML IJ SOLN
10.0000 mg | INTRAMUSCULAR | Status: AC
  Administered 2016-03-15: 10 mg via INTRAVENOUS
  Filled 2016-03-15: qty 3

## 2016-03-15 MED ORDER — BACITRACIN ZINC 500 UNIT/GM EX OINT
TOPICAL_OINTMENT | CUTANEOUS | Status: AC
  Filled 2016-03-15: qty 28.35

## 2016-03-15 MED ORDER — FENTANYL CITRATE (PF) 250 MCG/5ML IJ SOLN
INTRAMUSCULAR | Status: AC
  Filled 2016-03-15: qty 5

## 2016-03-15 MED ORDER — PROPOFOL 10 MG/ML IV BOLUS
INTRAVENOUS | Status: DC | PRN
  Administered 2016-03-15: 200 mg via INTRAVENOUS

## 2016-03-15 MED ORDER — SODIUM CHLORIDE 0.9 % IR SOLN
Status: DC | PRN
  Administered 2016-03-15: 500 mL

## 2016-03-15 MED ORDER — SODIUM CHLORIDE 0.9 % IV SOLN
26.0000 ug | INTRAVENOUS | Status: AC
  Administered 2016-03-15: 26 ug via INTRAVENOUS
  Filled 2016-03-15: qty 6.5

## 2016-03-15 MED ORDER — ROCURONIUM BROMIDE 50 MG/5ML IV SOLN
INTRAVENOUS | Status: AC
  Filled 2016-03-15: qty 1

## 2016-03-15 MED ORDER — PHENYLEPHRINE HCL 10 MG/ML IJ SOLN
INTRAMUSCULAR | Status: DC | PRN
  Administered 2016-03-15: 120 ug via INTRAVENOUS
  Administered 2016-03-15 (×2): 80 ug via INTRAVENOUS

## 2016-03-15 MED ORDER — LIDOCAINE-EPINEPHRINE 1 %-1:100000 IJ SOLN
INTRAMUSCULAR | Status: DC | PRN
  Administered 2016-03-15: 1 mL

## 2016-03-15 MED ORDER — METHYLENE BLUE 0.5 % INJ SOLN
INTRAVENOUS | Status: AC
  Filled 2016-03-15: qty 10

## 2016-03-15 MED ORDER — BACITRACIN ZINC 500 UNIT/GM EX OINT
TOPICAL_OINTMENT | CUTANEOUS | Status: DC | PRN
  Administered 2016-03-15: 1 via TOPICAL

## 2016-03-15 MED ORDER — EPHEDRINE 5 MG/ML INJ
INTRAVENOUS | Status: AC
  Filled 2016-03-15: qty 10

## 2016-03-15 MED ORDER — PROMETHAZINE HCL 25 MG/ML IJ SOLN: 6.2500 mg | INTRAMUSCULAR | Status: DC | PRN

## 2016-03-15 MED ORDER — MIDAZOLAM HCL 2 MG/2ML IJ SOLN
INTRAMUSCULAR | Status: AC
  Filled 2016-03-15: qty 2

## 2016-03-15 MED ORDER — CEFAZOLIN (ANCEF) 1 G IV SOLR
2.0000 g | INTRAVENOUS | Status: AC
  Administered 2016-03-15: 2 g
  Filled 2016-03-15: qty 2

## 2016-03-15 MED ORDER — HYDROMORPHONE HCL 1 MG/ML IJ SOLN: 0.2500 mg | INTRAMUSCULAR | Status: DC | PRN

## 2016-03-15 MED ORDER — SUGAMMADEX SODIUM 200 MG/2ML IV SOLN
INTRAVENOUS | Status: DC | PRN
  Administered 2016-03-15: 175 mg via INTRAVENOUS

## 2016-03-15 MED ORDER — FENTANYL CITRATE (PF) 250 MCG/5ML IJ SOLN
INTRAMUSCULAR | Status: DC | PRN
  Administered 2016-03-15 (×2): 50 ug via INTRAVENOUS
  Administered 2016-03-15: 150 ug via INTRAVENOUS

## 2016-03-15 MED ORDER — MIDAZOLAM HCL 2 MG/2ML IJ SOLN
INTRAMUSCULAR | Status: DC | PRN
  Administered 2016-03-15: 2 mg via INTRAVENOUS

## 2016-03-15 MED ORDER — LIDOCAINE 2% (20 MG/ML) 5 ML SYRINGE
INTRAMUSCULAR | Status: AC
  Filled 2016-03-15: qty 5

## 2016-03-15 MED ORDER — MINERAL OIL LIGHT 100 % EX OIL
TOPICAL_OIL | CUTANEOUS | Status: AC
  Filled 2016-03-15: qty 25

## 2016-03-15 MED ORDER — LACTATED RINGERS IV SOLN
INTRAVENOUS | Status: DC
  Administered 2016-03-15 (×3): via INTRAVENOUS

## 2016-03-15 MED ORDER — SCOPOLAMINE 1 MG/3DAYS TD PT72
MEDICATED_PATCH | TRANSDERMAL | Status: AC
  Administered 2016-03-15: 1 via TRANSDERMAL
  Filled 2016-03-15: qty 1

## 2016-03-15 MED ORDER — SUGAMMADEX SODIUM 200 MG/2ML IV SOLN
INTRAVENOUS | Status: AC
  Filled 2016-03-15: qty 2

## 2016-03-15 MED ORDER — ONDANSETRON HCL 4 MG/2ML IJ SOLN
4.0000 mg | INTRAMUSCULAR | Status: AC
  Administered 2016-03-15: 4 mg via INTRAVENOUS
  Filled 2016-03-15: qty 2

## 2016-03-15 MED ORDER — METHYLENE BLUE 0.5 % INJ SOLN
INTRAVENOUS | Status: DC | PRN
  Administered 2016-03-15: .1 mL

## 2016-03-15 MED ORDER — ROCURONIUM BROMIDE 100 MG/10ML IV SOLN
INTRAVENOUS | Status: DC | PRN
  Administered 2016-03-15: 50 mg via INTRAVENOUS

## 2016-03-15 MED ORDER — EPHEDRINE SULFATE 50 MG/ML IJ SOLN
INTRAMUSCULAR | Status: DC | PRN
  Administered 2016-03-15 (×2): 10 mg via INTRAVENOUS

## 2016-03-15 MED ORDER — LIDOCAINE-EPINEPHRINE 1 %-1:100000 IJ SOLN
INTRAMUSCULAR | Status: AC
  Filled 2016-03-15: qty 1

## 2016-03-15 MED ORDER — 0.9 % SODIUM CHLORIDE (POUR BTL) OPTIME
TOPICAL | Status: DC | PRN
  Administered 2016-03-15: 1000 mL

## 2016-03-15 MED ORDER — PHENYLEPHRINE 40 MCG/ML (10ML) SYRINGE FOR IV PUSH (FOR BLOOD PRESSURE SUPPORT)
PREFILLED_SYRINGE | INTRAVENOUS | Status: AC
  Filled 2016-03-15: qty 10

## 2016-03-15 SURGICAL SUPPLY — 43 items
BAG DECANTER FOR FLEXI CONT (MISCELLANEOUS) ×2 IMPLANT
BIT DRILL WIDENING 4MM (DRILL) ×1 IMPLANT
BLADE SURG ROTATE 9660 (MISCELLANEOUS) ×2 IMPLANT
CANISTER SUCTION 2500CC (MISCELLANEOUS) ×2 IMPLANT
CAP HEALING W/PLUG 030MM (MISCELLANEOUS) ×2 IMPLANT
COTTONBALL LRG STERILE PKG (GAUZE/BANDAGES/DRESSINGS) ×2 IMPLANT
COVER SURGICAL LIGHT HANDLE (MISCELLANEOUS) ×2 IMPLANT
DRAPE INCISE 23X17 IOBAN STRL (DRAPES) ×1
DRAPE INCISE IOBAN 23X17 STRL (DRAPES) ×1 IMPLANT
DRAPE SURG 17X11 SM STRL (DRAPES) ×2 IMPLANT
DRILL WIDENING 4MM (DRILL) ×2
DRSG GLASSCOCK MASTOID ADT (GAUZE/BANDAGES/DRESSINGS) ×2 IMPLANT
ELECT COATED BLADE 2.86 ST (ELECTRODE) ×2 IMPLANT
ELECT REM PT RETURN 9FT ADLT (ELECTROSURGICAL) ×2
ELECTRODE REM PT RTRN 9FT ADLT (ELECTROSURGICAL) ×1 IMPLANT
GAUZE PACKING IODOFORM 1/4X5 (PACKING) ×2 IMPLANT
GLOVE ECLIPSE 7.5 STRL STRAW (GLOVE) ×2 IMPLANT
GOWN STRL REUS W/ TWL LRG LVL3 (GOWN DISPOSABLE) ×2 IMPLANT
GOWN STRL REUS W/TWL LRG LVL3 (GOWN DISPOSABLE) ×2
GUIDE DRILL EAR BAHA 3MM 4MM (MISCELLANEOUS) ×2 IMPLANT
IMPL EAR ABUTMENT 4MM W/10 (Head) ×1 IMPLANT
IMPLANT EAR ABUTMENT 4MM W/10 (Head) ×2 IMPLANT
KIT BASIN OR (CUSTOM PROCEDURE TRAY) ×2 IMPLANT
KIT ROOM TURNOVER OR (KITS) ×2 IMPLANT
NEEDLE FILTER BLUNT 18X 1/2SAF (NEEDLE) ×1
NEEDLE FILTER BLUNT 18X1 1/2 (NEEDLE) ×1 IMPLANT
NEEDLE HYPO 25GX1X1/2 BEV (NEEDLE) ×2 IMPLANT
NEEDLE HYPO 25X1 1.5 SAFETY (NEEDLE) ×2 IMPLANT
NS IRRIG 1000ML POUR BTL (IV SOLUTION) ×2 IMPLANT
PAD ARMBOARD 7.5X6 YLW CONV (MISCELLANEOUS) ×4 IMPLANT
PENCIL BUTTON HOLSTER BLD 10FT (ELECTRODE) ×2 IMPLANT
SPONGE NEURO XRAY DETECT 1X3 (DISPOSABLE) ×2 IMPLANT
SUCTION FRAZIER HANDLE 10FR (MISCELLANEOUS) ×1
SUCTION TUBE FRAZIER 10FR DISP (MISCELLANEOUS) ×1 IMPLANT
SUT BONE WAX W31G (SUTURE) ×2 IMPLANT
SUT CHROMIC 3 0 PS 2 (SUTURE) ×2 IMPLANT
SUT CHROMIC 4 0 P 3 18 (SUTURE) ×2 IMPLANT
SUT ETHILON 5 0 PS 2 18 (SUTURE) ×2 IMPLANT
SYR BULB 3OZ (MISCELLANEOUS) ×2 IMPLANT
SYR TB 1ML LUER SLIP (SYRINGE) ×2 IMPLANT
TOWEL OR 17X26 10 PK STRL BLUE (TOWEL DISPOSABLE) ×2 IMPLANT
TRAY ENT MC OR (CUSTOM PROCEDURE TRAY) ×2 IMPLANT
WIPE INSTRUMENT VISIWIPE 73X73 (MISCELLANEOUS) ×2 IMPLANT

## 2016-03-15 NOTE — Anesthesia Preprocedure Evaluation (Signed)
Anesthesia Evaluation    Reviewed: Allergy & Precautions, NPO status , Patient's Chart, lab work & pertinent test results  Airway Mallampati: II  TM Distance: >3 FB Neck ROM: Full    Dental no notable dental hx.    Pulmonary neg pulmonary ROS,    Pulmonary exam normal breath sounds clear to auscultation       Cardiovascular hypertension, Pt. on medications and Pt. on home beta blockers Normal cardiovascular exam Rhythm:Regular Rate:Normal  S/P ablation   Neuro/Psych PSYCHIATRIC DISORDERS Anxiety Depression Menigioma history    GI/Hepatic Neg liver ROS, PUD, Ulcerative colitis   Endo/Other  negative endocrine ROS  Renal/GU negative Renal ROS  negative genitourinary   Musculoskeletal  (+) Arthritis ,   Abdominal   Peds negative pediatric ROS (+)  Hematology  (+) anemia , Von Willebrand's Disease   Anesthesia Other Findings   Reproductive/Obstetrics negative OB ROS                             Anesthesia Physical  Anesthesia Plan  ASA: III  Anesthesia Plan: MAC   Post-op Pain Management:    Induction: Intravenous  Airway Management Planned: Natural Airway and Simple Face Mask  Additional Equipment:   Intra-op Plan:   Post-operative Plan:   Informed Consent: I have reviewed the patients History and Physical, chart, labs and discussed the procedure including the risks, benefits and alternatives for the proposed anesthesia with the patient or authorized representative who has indicated his/her understanding and acceptance.   Dental advisory given  Plan Discussed with: CRNA  Anesthesia Plan Comments: (DDAVP preoop for VWD)        Anesthesia Quick Evaluation

## 2016-03-15 NOTE — Anesthesia Procedure Notes (Signed)
Procedure Name: Intubation Date/Time: 03/15/2016 8:42 AM Performed by: Lance Coon Pre-anesthesia Checklist: Patient identified, Emergency Drugs available, Timeout performed, Patient being monitored and Suction available Patient Re-evaluated:Patient Re-evaluated prior to inductionOxygen Delivery Method: Circle system utilized Preoxygenation: Pre-oxygenation with 100% oxygen Intubation Type: IV induction Ventilation: Mask ventilation without difficulty Laryngoscope Size: Miller and 2 Grade View: Grade II Tube type: Oral Tube size: 7.0 mm Number of attempts: 1 Airway Equipment and Method: Stylet Placement Confirmation: ETT inserted through vocal cords under direct vision,  positive ETCO2 and breath sounds checked- equal and bilateral Secured at: 21 cm Tube secured with: Tape Dental Injury: Teeth and Oropharynx as per pre-operative assessment

## 2016-03-15 NOTE — Transfer of Care (Signed)
Immediate Anesthesia Transfer of Care Note  Patient: Lauren [name]  Procedure(s) Performed: Procedure(s): REVISION RIGHT BAHA HEARING IMPLANT  (Right)  Patient Location: PACU  Anesthesia Type:General  Level of Consciousness: awake, alert , oriented and patient cooperative  Airway & Oxygen Therapy: Patient Spontanous Breathing  Post-op Assessment: Report given to RN and Post -op Vital signs reviewed and stable  Post vital signs: Reviewed and stable  Last Vitals:  Filed Vitals:   03/15/16 0714  BP: 153/79  Pulse: 57  Temp: 36.7 C  Resp: 20    Last Pain: There were no vitals filed for this visit.       Complications: No apparent anesthesia complications

## 2016-03-15 NOTE — Anesthesia Postprocedure Evaluation (Signed)
Anesthesia Post Note  Patient: Lauren [name]  Procedure(s) Performed: Procedure(s) (LRB): REVISION RIGHT BAHA HEARING IMPLANT  (Right)  Patient location during evaluation: PACU Anesthesia Type: General Level of consciousness: sedated Pain management: pain level controlled Vital Signs Assessment: post-procedure vital signs reviewed and stable Respiratory status: spontaneous breathing and respiratory function stable Cardiovascular status: stable Anesthetic complications: no    Last Vitals:  Filed Vitals:   03/15/16 1058 03/15/16 1107  BP:  131/74  Pulse:  68  Temp: 36.2 C   Resp:  16    Last Pain:  Filed Vitals:   03/15/16 1108  PainSc: 0-No pain                 Girolamo Lortie DANIEL

## 2016-03-15 NOTE — Discharge Instructions (Signed)
1. Elevate head of bed 30 degrees for the next 5 nights. 2. Keep right implant dry x 1 wk. 3. Follow your regular diet at home. 4. Apply bactroban ointment to incision line twice/day. 5. Leave healing cap applied to implant for 1 wk. 6. Call [phone] for any questions or problems related to your operation. 7. DC meds: Cefzil 500 mg twice per day for 10 days with food, Vicodin 5/300 1 tab every 4-6hrs. As needed for pain, mupirocin ointment - apply to incision twice per day for 1 wk. 8. Spray your nose with Stimate, one spray each nostril, on the morning of March 16, 2016 9. Go to [facility] lab at my office (first floor) on the morning of March 16, 2016 to have a blood test drawn (BMET panel) 10. Please call my office if you experience any excessive bleeding.

## 2016-03-15 NOTE — Brief Op Note (Signed)
03/15/2016  10:28 AM  PATIENT:  Lauren [name]  55 y.o. female  PRE-OPERATIVE DIAGNOSIS:  Sensorineural hearing loss AD (single sided deafness) POST-OPERATIVE DIAGNOSIS:   Sensorineural hearing loss AD (single sided deafness)  PROCEDURE:  Procedure(s): REVISION RIGHT BAHA HEARING IMPLANT  (Right)  - BIA400 10x78mm implant SURGEON:  Surgeon(s) and Role:    * Vicie Mutters, MD - Primary  PHYSICIAN ASSISTANT:   ASSISTANTS: none   ANESTHESIA:   general  EBL:  Total I/O In: 1500 [I.V.:1500] Out: 3 [Blood:3]  BLOOD ADMINISTERED:none - patient received DDAVP 26 mcg in 50 ml NS over 20 minutes prior to the procedure due to Type I Von Willebrand's disease  DRAINS: none   LOCAL MEDICATIONS USED:  XYLOCAINE 1 ml  SPECIMEN:  No Specimen  DISPOSITION OF SPECIMEN:  N/A  COUNTS:  YES  TOURNIQUET:  * No tourniquets in log *  DICTATION: .Other Dictation: Dictation Number dictation system did not give me a number after pressing #5, dictated on 03-15-16 at 10:25am  PLAN OF CARE: Discharge to home after PACU  PATIENT DISPOSITION:  PACU - hemodynamically stable.   Delay start of Pharmacological VTE agent (>24hrs) due to surgical blood loss or risk of bleeding: not applicable

## 2016-03-15 NOTE — Interval H&P Note (Signed)
History and Physical Interval Note:  03/15/2016 8:16 AM  Lauren [name]  has presented today for surgery, with the diagnosis of SENSORINEURAL HEARING LOSS, RIGHT EAR (SINGLE SIDED DEAFNESS) AND VON WILLEBRAND'S DISEASE.   The various methods of treatment have been discussed with the patient and family. After consideration of risks, benefits and other options for treatment, the patient has consented to  Procedure(s): REVISION RIGHT BAHA HEARING IMPLANT  (Right) as a surgical intervention .  The patient's history has been reviewed, patient examined, no change in status, stable for surgery.  I have reviewed the patient's chart and labs.  Questions were answered to the patient's satisfaction.  DDAVP, 26 mcg IV in 50 ml of NS has been ordered to be given over 20 minutes, 30 minutes prior to the procedure. The patient will use Stimate, one spray each nostril, tomorrow morning. A BMET has been ordered for tomorrow morning at [facility]. The patient has been watching her intake of free water vigilantly. The patient does not report any recent upper respiratory tract infections, cough, or fever during the last 72 hours.   [name], Margery Szostak M

## 2016-03-16 ENCOUNTER — Encounter (HOSPITAL_COMMUNITY): Payer: Self-pay | Admitting: Otolaryngology

## 2016-03-16 NOTE — Op Note (Signed)
[name], [name] NO.:  [id number]  MEDICAL RECORD NO.:  [id number]  LOCATION:  [facility]                         FACILITY:  [facility]  PHYSICIAN:  Lauren [name], M.D.    DATE OF BIRTH:  1961/07/12  DATE OF PROCEDURE:  03/15/2016 DATE OF DISCHARGE:  03/15/2016                              OPERATIVE REPORT   INDICATIONS FOR PROCEDURE:  Lauren [name] is a very pleasant 55 year old white female with single-sided deafness, right ear, who is here today for a revision right BAHA hearing implant.  She had undergone a previous right BAHA implant with a skin graft technique by Dr. Tamsen Roers [name].  She developed a MRSA infection and extruded the implant.  The patient is immunocompromised on Imuran for ulcerative colitis and also has type 1 Von Willebrand disease.  She was recommended for a revision right BAHA implant.  The procedure was scheduled at [facility] because of the [place] disease and need for DDAVP.  Also [place] was closed for HVAC renovations.  Risks, complications, and alternatives of the right BAHA hearing implant were explained to her. Questions were invited and answered, and informed consent was signed and witnessed.  Preoperatively, I consulted with Dr. Burney [name] of Hematology/Oncology.  He agreed that the patient should receive DDAVP 26 mcg in 50 mL of saline over 20 minutes, half an hour prior to the procedure. She will also administer Stimate to herself, 1 spray each nostril,24 hours postop, and I ordered her a BMET 24 hours postop at [facility] labs.  PREOPERATIVE DIAGNOSIS: 1. Single-sided deafness, right ear.                          2. Von Willebrand's disease Type I  POSTOPERATIVE DIAGNOSIS: 1. Single-sided deafness, right ear.                           2. Von Willebrand's disease Type I  OPERATION:  Revision right BAHA hearing implant.  SURGEON:  Lauren [name], M.D.  ANESTHESIA:  General endotracheal, Dr.  Tamela Gammon, CRNA = Lauren [name].  COMPLICATIONS:  None.  DISCHARGE STATUS:  Stable.  SUMMARY OF REPORT:  In the holding area, an IV had been begun.  The patient then received 26 mcg of DDAVP in 50 mL of normal saline over 20 minutes.  She was then taken to operating room #10 in [facility] OR and placed in the supine position.  General IV induction was then performed by Dr. Tamela [name], and the patient was orally intubated by [facility] without difficulty.  Eyelids were taped shut.  She was properly positioned and monitored.  Elbows and ankles were padded with foam Rubber, and I initiated a time-out at 8:48 am.  The patient was then turned 90 degrees counterclockwise, and a small amount of hair was clipped in the right postauricular area.  Hair was Taped, and a stockinette cap was applied.  Dr. Benjamine [name] had performed a previous right BAHA implant using a skin graft technique.  Because of this, I needed to move the implant site anterior  and superior to the previous implant site.  This was 60 mm from the anterior external auditory canal.  The implant site was marked, and a vertical incision measuring 2.5 cm was also marked.  This was infiltrated with 1 mL of 1% Xylocaine with 1:100,000 epinephrine.  The patient's right ear, hemiface, and scalp were then prepped with Betadine and draped in a standard fashion for a revision BAHA implant.  Methylene blue dye was used to tattoo through the skin to the skull at the implant site.  A 5 mm dermatologic punch was used to remove a 5 mm disc of skin, and then a 3.5 cm vertical incision was made.  I elected to perform an open procedure, rather than a punch technique, due to the patient's history of immunocompromise, the Von Willebrand's disease, and the previous extrusion.  The subcutaneous tissue was then Dissected, and the implant site was identified.  A cruciate incision was made in the periosteum, and 4 periosteal flaps were elevated with a raspatorium.   A 3 mm pilot hole was then drilled and deepened to 4 mm without difficulty.  There was solid bone medial to the 4 mm hole.  The opening was then enlarged with a 4 mm countersink bur using continuous suction irrigation.  Again, there was good bone medial to the depths of the hole.  The site was copiously irrigated with bacitracin-containing saline.  All drilling was done at 2000 rpms.  The drill was then set at 50 N/cm of torque, and an abutment inserter was attached to the drill handle.  A measurement was taken from the skull to the skin and measured 9 mm.  Therefore, a 10 mm BIA400 implant with 4 mm width was then opened and attached to the drill handle.  The implant was then centered in the 4 mm countersink hole. Again, using continuous suction irrigation, the implant was implanted at 50 N/cm of torque and then hand tightened 1/8th of the turn.  The periosteal flaps were then returned and placed around the neck of the implant.  Bleeding was controlled with unipolar cautery.  The incision was then closed in 2 layers using interrupted inverted 3-0 chromics for subcutaneous layer, and skin was closed with interrupted 5-0 Ethilon.  Bacitracin ointment was applied to the incision, a healing cap was applied, and a quarter- inch iodoform Nu-[name] impregnated with bacitracin ointment was wrapped around the implant medial to the healing cap.  The ear was padded with Telfa and cotton, and a standard adult Glasscock mastoid dressing was applied loosely in a standard fashion.  The patient was then awakened, extubated, and transferred to her hospital bed.  She appeared to tolerate both the general endotracheal anesthesia and the procedure well and left the operating room in stable condition.  TOTAL FLUIDS:  1500 mL of crystalloid.  TOTAL BLOOD LOSS:  Less than 5 mL.  There was very little bleeding during the procedure after the DDAVP had been administered.  SPECIMENS:  There were no specimens  sent to pathology.  MEDICATIONS:  The patient received the following intraoperative medications:  Ancef 2 g IV, Zofran 4 mg IV at the beginning and the end of the procedure and Decadron 10 mg IV.  The patient received IV Ancef, although she had a history of cephalosporin GI intolerance and not a true type 1 allergic reaction.  Lauren [name] will be admitted to the PACU and then will be discharged home today with her husband, Ronalee Belts.  He will be instructed to  return her to my office on March 22, 2016 at 2:10 p.m.  DISCHARGE MEDICATIONS:  Will include the following:  Cefzil 500 mg p.o. b.i.d. x10 days with food, Vicodin 5/300 #4 tabs 1 p.o. q.4-6 hours p.r.n. pain, and mupirocin ointment applied to the incision b.i.d. x1 week.  She is to keep her head elevated.  Avoid direct pressure over the implant for the next week and call [phone] for any postoperative problems directly related to the procedure.  She will be given both verbal and written instructions.  SUMMARY:  The patient underwent an uncomplicated revision right BAHA hearing implant using a 10 mm x 4 mm BIA400 implant.  There were no complications.     Lauren [name], M.D.     EMK/MEDQ  D:  03/15/2016  T:  03/16/2016  Job:  [id number]  cc:   Volanda Napoleon, M.D.

## 2016-05-24 ENCOUNTER — Ambulatory Visit: Payer: No Typology Code available for payment source | Admitting: Gastroenterology

## 2016-06-02 ENCOUNTER — Other Ambulatory Visit: Payer: Self-pay | Admitting: Family Medicine

## 2016-06-02 ENCOUNTER — Other Ambulatory Visit: Payer: Self-pay | Admitting: Emergency Medicine

## 2016-06-02 DIAGNOSIS — E878 Other disorders of electrolyte and fluid balance, not elsewhere classified: Secondary | ICD-10-CM

## 2016-06-02 MED ORDER — POTASSIUM CHLORIDE CRYS ER 20 MEQ PO TBCR: EXTENDED_RELEASE_TABLET | ORAL | 0 refills | Status: DC

## 2016-06-28 ENCOUNTER — Other Ambulatory Visit: Payer: Self-pay | Admitting: Family Medicine

## 2016-06-28 DIAGNOSIS — E878 Other disorders of electrolyte and fluid balance, not elsewhere classified: Secondary | ICD-10-CM

## 2016-06-29 ENCOUNTER — Other Ambulatory Visit: Payer: Self-pay | Admitting: Physician Assistant

## 2016-07-04 ENCOUNTER — Telehealth: Payer: Self-pay | Admitting: Physician Assistant

## 2016-07-04 NOTE — Telephone Encounter (Signed)
Caller name: Relationship to patient: Self Can be reached: [phone]  Pharmacy:  [facility], [place] [phone] (Phone) [phone] (Fax)     Reason for call: Refill potassium chloride SA (K-DUR,KLOR-CON) 20 MEQ tablet [[id number]]

## 2016-07-05 ENCOUNTER — Other Ambulatory Visit: Payer: Self-pay | Admitting: Emergency Medicine

## 2016-07-05 ENCOUNTER — Telehealth: Payer: Self-pay | Admitting: Emergency Medicine

## 2016-07-05 DIAGNOSIS — E878 Other disorders of electrolyte and fluid balance, not elsewhere classified: Secondary | ICD-10-CM

## 2016-07-05 MED ORDER — POTASSIUM CHLORIDE CRYS ER 20 MEQ PO TBCR: EXTENDED_RELEASE_TABLET | ORAL | 0 refills | Status: DC

## 2016-07-05 NOTE — Telephone Encounter (Signed)
Tried to contact pt to inform that 30 day refill of potassium has been sent in and that it is time to recheck BMP. Per Dr. Lorelei Pont note from 07/05/15 recheck potassium level every 3-4 months. Last checked 03/14/16 and was abnormal.   Left voicemail for pt to return call to schedule lab only appt to recheck BMP.

## 2016-07-14 ENCOUNTER — Other Ambulatory Visit (INDEPENDENT_AMBULATORY_CARE_PROVIDER_SITE_OTHER): Payer: 59

## 2016-07-14 ENCOUNTER — Ambulatory Visit (INDEPENDENT_AMBULATORY_CARE_PROVIDER_SITE_OTHER): Payer: 59 | Admitting: Gastroenterology

## 2016-07-14 ENCOUNTER — Encounter: Payer: Self-pay | Admitting: Gastroenterology

## 2016-07-14 VITALS — BP 110/70 | HR 73 | Ht 59.0 in | Wt 191.0 lb

## 2016-07-14 DIAGNOSIS — K5150 Left sided colitis without complications: Secondary | ICD-10-CM | POA: Diagnosis not present

## 2016-07-14 LAB — COMPREHENSIVE METABOLIC PANEL
ALT: 14 U/L (ref 0–35)
AST: 14 U/L (ref 0–37)
Albumin: 3.9 g/dL (ref 3.5–5.2)
Alkaline Phosphatase: 65 U/L (ref 39–117)
BUN: 12 mg/dL (ref 6–23)
CO2: 30 mEq/L (ref 19–32)
Calcium: 9.7 mg/dL (ref 8.4–10.5)
Chloride: 103 mEq/L (ref 96–112)
Creatinine, Ser: 0.75 mg/dL (ref 0.40–1.20)
GFR: 85.15 mL/min (ref >60.00)
Glucose, Bld: 105 mg/dL — ABNORMAL HIGH (ref 70–99)
Potassium: 4.1 mEq/L (ref 3.5–5.1)
Sodium: 139 mEq/L (ref 135–145)
Total Bilirubin: 0.4 mg/dL (ref 0.2–1.2)
Total Protein: 7.6 g/dL (ref 6.0–8.3)

## 2016-07-14 LAB — CBC WITH DIFFERENTIAL/PLATELET
Basophils Absolute: 0 10*3/uL (ref 0.0–0.1)
Basophils Relative: 0.7 % (ref 0.0–3.0)
Eosinophils Absolute: 0.2 10*3/uL (ref 0.0–0.7)
Eosinophils Relative: 3.4 % (ref 0.0–5.0)
HCT: 41.5 % (ref 36.0–46.0)
Hemoglobin: 14.3 g/dL (ref 12.0–15.0)
Lymphocytes Relative: 34.1 % (ref 12.0–46.0)
Lymphs Abs: 2.2 10*3/uL (ref 0.7–4.0)
MCHC: 34.3 g/dL (ref 30.0–36.0)
MCV: 85.5 fl (ref 78.0–100.0)
Monocytes Absolute: 0.5 10*3/uL (ref 0.1–1.0)
Monocytes Relative: 7.7 % (ref 3.0–12.0)
Neutro Abs: 3.5 10*3/uL (ref 1.4–7.7)
Neutrophils Relative %: 54.1 % (ref 43.0–77.0)
Platelets: 278 10*3/uL (ref 150.0–400.0)
RBC: 4.85 Mil/uL (ref 3.87–5.11)
RDW: 13.3 % (ref 11.5–15.5)
WBC: 6.4 10*3/uL (ref 4.0–10.5)

## 2016-07-14 LAB — VITAMIN B12: Vitamin B-12: 506 pg/mL (ref 211–911)

## 2016-07-14 LAB — C-REACTIVE PROTEIN: CRP: 0.8 mg/dL (ref 0.5–20.0)

## 2016-07-14 LAB — SEDIMENTATION RATE: Sed Rate: 41 mm/hr — ABNORMAL HIGH (ref 0–30)

## 2016-07-14 NOTE — Patient Instructions (Signed)
Your physician has requested that you go to the basement for the following lab work before leaving today:  Dr. Havery Moros would like you to review information on Humira and Weyman Rodney

## 2016-07-14 NOTE — Progress Notes (Signed)
HPI :  Colitis History 55 y/o female here for follow up for UC. She has a history of what appears to be left sided ulcerative colitis, diagnosed around 15 years ago from what she can recall. She has been on Remicade in the past for her colitis, which she thinks she did well for a few years. She had a severe MRSA infection of her ear and ultimately Remicade was held and eventually was stopped given she had done well when it was held. She has been on Imuran in the past, she thinks she tolerated it well, she thinks it was weaned off given she had been feeling well also. She has never been on Humira or other anti-TNF agents. She has never been on methotrexate. She has been on Lialda for a long time, prior to that Asacol. She is taking 4.8gm Lialda for a long time.  She has Von Willebrands. She bleeds easily but has never had significant bleeding / hemorrhages in the past. She has had DDAVP infusions prior to her last colonoscopy or Stilmate nose spray.    She is not aware of much of her family history in regards to [facility] or IBD.  Colonoscopy 2014 - active rectal inflammation, inflammatory polyp Colonoscopy 09/21/2015 - overall good control of colitis with small mild area of inflammation in left colon, mild chronic colitis  SINCE LAST VISIT:  She reports she had been on 2 tabs of Lialda per day at baseline but had some worsening symptoms, then increased to 4 tabs per day for the past 4-5 months. She has had some loose stools with rare blood. Symptoms mild in general. She has some rare bowel spasms. She is having roughly 5 BMs per day, mostly liquids or soft, she rarely ever sees blood. She has not been on any antibiotics recently but has been on them a few months ago. She had been on Florastor over several months. She called in during July with flare of symptoms and we discussed adding Rowasa. She used it most days of the month for a few months. She did not think it provided any significant benefit in  regards to her stool frequency. She stopped it over labor day and has not noticed any worsening since stopping it.  She reports having had a TB test negative at work a few months ago. Last colonoscopy in January showed fairly good control of disease.    Past Medical History:  Diagnosis Date  . Anemia   . Cellulitis of left breast 09/17/2015   diagnosed at Urgent Care- on ABX  . Clotting disorder ([place])   . Depression    takes Prozac daily  . Hearing loss    right ear  . History of colon polyps    benign  . Hypertension    takes Metoprolol daily  . Hypokalemia    takes Potassium every other day  . Inflammatory polyps of colon ([place])   . Internal hemorrhoids   . Osteopenia   . Personal history of meningioma of the brain   . Pneumonia    hx of-about 4+ yrs ago  . Seasonal allergies    takes Allegra as needed and Flonase daily  . SVT (supraventricular tachycardia) ([place])   . Tennis elbow   . Ulcerative colitis    takes Lialda daily and Rowasa at bedtime  . Urinary urgency   . Vitamin B12 deficiency   . Von Willebrands disease [facility])      Past Surgical History:  Procedure Laterality Date  .  BAHA REVISION Right 03/15/2016   Procedure: REVISION RIGHT BAHA HEARING IMPLANT ;  Surgeon: Vicie Mutters, MD;  Location: [facility];  Service: ENT;  Laterality: Right;  . BRAIN MENINGIOMA EXCISION  05/2007  . CARDIAC ELECTROPHYSIOLOGY STUDY AND ABLATION  10/22/09  . COLONOSCOPY N/A 07/29/2013   Procedure: COLONOSCOPY;  Surgeon: Lafayette Dragon, MD;  Location: [facility];  Service: Endoscopy;  Laterality: N/A;  . COLONOSCOPY WITH PROPOFOL N/A 09/21/2015   Procedure: COLONOSCOPY WITH PROPOFOL;  Surgeon: Manus Gunning, MD;  Location: [facility];  Service: Gastroenterology;  Laterality: N/A;  . ESOPHAGOGASTRODUODENOSCOPY    . IMPLANTATION BONE ANCHORED HEARING AID Right   . WISDOM TOOTH EXTRACTION     Family History  Problem Relation Age of Onset  . Heart disease Maternal Grandfather   .  Heart disease Mother   . Stroke Mother   . Diabetes Maternal Uncle   . Colon cancer Neg Hx    Social History  Substance Use Topics  . Smoking status: Never Smoker  . Smokeless tobacco: Never Used  . Alcohol use No   Current Outpatient Prescriptions  Medication Sig Dispense Refill  . Calcium Carbonate-Vit D-Min (CALCIUM 1200) 1200-1000 MG-UNIT CHEW Chew 1 tablet by mouth daily.    Marland Kitchen FLUoxetine (PROZAC) 20 MG capsule TAKE ONE CAPSULE BY MOUTH EVERY DAY 90 capsule 0  . mesalamine (LIALDA) 1.2 G EC tablet Take 4 tablets (4.8 g total) by mouth daily. 120 tablet 11  . metoprolol succinate (TOPROL-XL) 50 MG 24 hr tablet Take 50 mg by mouth daily.    . Potassium Chloride ER 20 MEQ TBCR Take 1 tablet by mouth every one to two days as directed    . potassium chloride SA (K-DUR,KLOR-CON) 20 MEQ tablet Take one by mouth every 1-2 days 30 tablet 0   No current facility-administered medications for this visit.    Allergies  Allergen Reactions  . Atenolol Swelling  . Cephalexin Other (See Comments)    Upset her colitis     Review of Systems: All systems reviewed and negative except where noted in HPI.    Lab Results  Component Value Date   WBC 6.4 07/14/2016   HGB 14.3 07/14/2016   HCT 41.5 07/14/2016   MCV 85.5 07/14/2016   PLT 278.0 07/14/2016    Lab Results  Component Value Date   CREATININE 0.75 07/14/2016   BUN 12 07/14/2016   NA 139 07/14/2016   K 4.1 07/14/2016   CL 103 07/14/2016   CO2 30 07/14/2016    Lab Results  Component Value Date   ALT 14 07/14/2016   AST 14 07/14/2016   ALKPHOS 65 07/14/2016   BILITOT 0.4 07/14/2016   Lab Results  Component Value Date   ESRSEDRATE 41 (H) 07/14/2016   POCTSEDRATE 13 06/05/2012    Lab Results  Component Value Date   CRP 0.8 07/14/2016     Physical Exam: BP 110/70   Pulse 73   Ht 4' 11" (1.499 m)   Wt 191 lb (86.6 kg)   LMP 11/26/2008   BMI 38.58 kg/m  Constitutional: Pleasant,well-developed, female in no  acute distress. HEENT: Normocephalic and atraumatic. Conjunctivae are normal. No scleral icterus. Neck supple.  Cardiovascular: Normal rate, regular rhythm.  Pulmonary/chest: Effort normal and breath sounds normal. No wheezing, rales or rhonchi. Abdominal: Soft, nondistended, nontender There are no masses palpable. No hepatomegaly. Extremities: no edema Lymphadenopathy: No cervical adenopathy noted. Neurological: Alert and oriented to person place and time. Skin: Skin is  warm and dry. No rashes noted. Psychiatric: Normal mood and affect. Behavior is normal.   ASSESSMENT AND PLAN: 55 year old female with left-sided ulcerative colitis, here for follow-up. She had remotely been on Remicade for this in the past, but developed severe MRSA infection, had held Remicade for a period of time and never resumed it as she continued to feel well. She's been on mesalamine monotherapy for a period of time now and has generally done fairly well. Last colonoscopy showed only mildly active inflammation.  Unfortunately recent months she's had some recurrence of some symptoms, although mild. We obtained basic blood work today which shows no anemia, normal WBC, normal CRP, mildly elevated ESR. She feels these symptoms do represent her colitis although it is possible she has con commitment IBS. I will send C. difficile stool testing to ensure negative. If C. difficile is negative we can consider a course of steroid enemas versus Uceris to see if this helps. She may need a flexible sigmoidoscopy to determine if we need to escalate care for her colitis to confirm active inflammation.  If we do need to escalate her care we discussed options moving forward, namely Humira versus Entyvio. She has been on Imuran in the past but discussed this as well as methotrexate as well. I provided her handouts for Humira and Entyvio and she will think about this.   In the interim we will await her C. difficile testing. I will contact  her shortly with further recommendations. Moving forward we'll plan for surveillance colonoscopy every one to 2 years given her duration of disease.  Sutherland Cellar, MD [facility] Pager [phone]

## 2016-07-15 LAB — CLOSTRIDIUM DIFFICILE BY PCR: Toxigenic C. Difficile by PCR: NOT DETECTED

## 2016-07-17 ENCOUNTER — Other Ambulatory Visit: Payer: Self-pay

## 2016-07-17 MED ORDER — BUDESONIDE 9 MG PO TB24
1.0000 | ORAL_TABLET | Freq: Every day | ORAL | 1 refills | Status: AC
Start: 2016-07-17 — End: 2016-08-28

## 2016-07-18 ENCOUNTER — Other Ambulatory Visit: Payer: Self-pay

## 2016-07-24 NOTE — Telephone Encounter (Signed)
c 

## 2016-08-07 ENCOUNTER — Other Ambulatory Visit: Payer: Self-pay | Admitting: Family Medicine

## 2016-08-07 DIAGNOSIS — E878 Other disorders of electrolyte and fluid balance, not elsewhere classified: Secondary | ICD-10-CM

## 2016-08-07 NOTE — Telephone Encounter (Signed)
Received refill request for potassium chloride. Last office visit 07/05/2015 and last refill 12/31/2015. Pt's last potassium check was 02/2013.  Is it ok to refill? Please advise.

## 2016-08-25 ENCOUNTER — Other Ambulatory Visit: Payer: Self-pay

## 2016-08-25 ENCOUNTER — Telehealth: Payer: Self-pay | Admitting: Gastroenterology

## 2016-08-25 MED ORDER — ONDANSETRON HCL 4 MG PO TABS: 4.0000 mg | ORAL_TABLET | Freq: Four times a day (QID) | ORAL | 0 refills | Status: DC | PRN

## 2016-08-25 MED ORDER — DICYCLOMINE HCL 10 MG PO CAPS: 10.0000 mg | ORAL_CAPSULE | Freq: Four times a day (QID) | ORAL | 0 refills | Status: DC | PRN

## 2016-08-25 NOTE — Telephone Encounter (Signed)
Dr. Fuller Plan patient asked if you want her to stop taking the Uceris or continue taking it with the other prescribed medications? Thank you.

## 2016-08-25 NOTE — Telephone Encounter (Signed)
Patient advised, understands to call if she has questions or concerns. Otherwise, will see her at scheduled follow up appointment.

## 2016-08-25 NOTE — Progress Notes (Signed)
Yes continue budesonide at same dose If not feeling better please call back

## 2016-08-25 NOTE — Telephone Encounter (Signed)
Resume Lialda 2 po qd Dicyclomine 10 mg po qid prn abd pain/cramping, #30, no refills Zofran 4 mg po qid prn N/V, #20, no refills Clear liquids and advance as tolerated

## 2016-08-25 NOTE — Progress Notes (Signed)
Do you want her to continue on the Budesonide? She just picked up the refill. Thanks, let me know.

## 2016-08-25 NOTE — Telephone Encounter (Signed)
Continue Uceris

## 2016-08-25 NOTE — Telephone Encounter (Signed)
Patient started on Uceris 9 mg daily for 6 weeks on 07/17/16. She stopped taking the Lialda, reports having formed bm's until last week. This week having diarrhea, with red blood. She admits she has hemorrhoids and Von Willebrands. She has abdominal cramping, stomach hard. She also started having nausea and vomiting last night. She was able to eat today and keep food down, denies any fever or chills. Patient does have follow up appointment with Dr. Havery Moros on 1/12. Please advise.

## 2016-08-29 ENCOUNTER — Telehealth: Payer: Self-pay | Admitting: Gastroenterology

## 2016-08-29 NOTE — Telephone Encounter (Signed)
Patient called back. She was placed on Uceris 9mg  / day and had some improvement in symptoms after one week. She stopped her Lialda during this time, I think she was confused as to instructions regarding this, and resumed 2 tabs per day on 12/29. She reports this has provided her some benefit since the [place] was resumed, stools more formed and less liquid. Recommend she increase Lialda to 4 tabs daily - total of 4.8 gm/ day, and use Rowasa which she has at home, and continue Uceris. I will see her in 2 weeks for follow up. If any worsening in the upcoming days she should contact me, may need to put her on prednisone in this setting. She agreed, all questions answered.

## 2016-08-29 NOTE — Telephone Encounter (Signed)
Patient called on 12/29 and not feeling well on present regimen. I called her back to see how she is feeling after her daughter Carla Drape mentioned she was not feeling well. Left message on home and cell phone, will call back again later, no answer

## 2016-09-03 ENCOUNTER — Other Ambulatory Visit: Payer: Self-pay | Admitting: Family Medicine

## 2016-09-03 DIAGNOSIS — K5190 Ulcerative colitis, unspecified, without complications: Secondary | ICD-10-CM

## 2016-09-04 ENCOUNTER — Other Ambulatory Visit: Payer: Self-pay | Admitting: Emergency Medicine

## 2016-09-08 ENCOUNTER — Other Ambulatory Visit (INDEPENDENT_AMBULATORY_CARE_PROVIDER_SITE_OTHER): Payer: 59

## 2016-09-08 ENCOUNTER — Encounter: Payer: Self-pay | Admitting: Gastroenterology

## 2016-09-08 ENCOUNTER — Ambulatory Visit: Payer: No Typology Code available for payment source | Admitting: Gastroenterology

## 2016-09-08 ENCOUNTER — Ambulatory Visit (INDEPENDENT_AMBULATORY_CARE_PROVIDER_SITE_OTHER): Payer: 59 | Admitting: Gastroenterology

## 2016-09-08 DIAGNOSIS — K5150 Left sided colitis without complications: Secondary | ICD-10-CM | POA: Diagnosis not present

## 2016-09-08 DIAGNOSIS — K5190 Ulcerative colitis, unspecified, without complications: Secondary | ICD-10-CM

## 2016-09-08 LAB — VITAMIN D 25 HYDROXY (VIT D DEFICIENCY, FRACTURES): VITD: 14.4 ng/mL — ABNORMAL LOW (ref 30.00–100.00)

## 2016-09-08 MED ORDER — MESALAMINE 1.2 G PO TBEC: 1.2000 g | DELAYED_RELEASE_TABLET | Freq: Four times a day (QID) | ORAL | 3 refills | Status: DC

## 2016-09-08 NOTE — Patient Instructions (Signed)
If you are age 56 or older, your body mass index should be between 23-30. Your Body mass index is 38.02 kg/m. If this is out of the aforementioned range listed, please consider follow up with your Primary Care Provider.  If you are age 58 or younger, your body mass index should be between 19-25. Your Body mass index is 38.02 kg/m. If this is out of the aformentioned range listed, please consider follow up with your Primary Care Provider.   We have sent the following medications to your pharmacy for you to pick up at your convenience:  [facility] has requested that you go to the basement for the following lab work before leaving today:  Vitamin D Level  Thank you.

## 2016-09-08 NOTE — Progress Notes (Signed)
HPI :  Colitis History 56 y/o female here for follow up for UC. She has a history of what appears to be left sided ulcerative colitis, diagnosed around 15 years ago from what she can recall. She has been on Remicade in the past for her colitis, which she thinks she did well for a few years. She had a severe MRSA infection of her ear and ultimately Remicade was held and eventually was stopped given she had done well when it was held. She has been on Imuran in the past, she thinks she tolerated it well, she thinks it was weaned off given she had been feeling well also. She has never been on Humira or other anti-TNF agents. She has never been on methotrexate. She has been on Lialda for a long time, prior to that Asacol. She is taking 4.8gm Lialda for a long time.  She has Von Willebrands. She bleeds easily but has never had significant bleeding / hemorrhages in the past. She has had DDAVP infusions prior to her last colonoscopy or Stilmate nose spray.   She is not aware of much of her family history in regards to [facility] or IBD.  Colonoscopy 2014 - active rectal inflammation, inflammatory polyp Colonoscopy 09/21/2015 - overall good control of colitis with small mild area of inflammation in left colon, mild chronic colitis  SINCE LAST VISIT:  Since last visit the patient had a flare of her colitis. Tested negative for C diff. Started on Uceris 9mg  daily but she had stopped her Lialda during this time and still had some ongoing symptoms.  We called her and she then resumed Lialda to 4.8 gm / day and added Rowasa enemas.   She reports this combination of therapy has helped her. The past 4 days she has felt normal and now feels back to baseline. She reports about 2-3 BMs per day, formed stools, rarely loose. She reports no blood in the stools. She thinks she has had about 2 flares in the past year on Lialda, although this last occurred while on 2 tabs daily. She thinks her baseline 2-3 BMs per day, near  baseline. She is on her last week of Uceris. As above, she has been on Remicade in the past complicated by MRSA and not sure if Imuran helped her historically.        Past Medical History:  Diagnosis Date  . Anemia   . Cellulitis of left breast 09/17/2015   diagnosed at Urgent Care- on ABX  . Clotting disorder ([place])   . Depression    takes Prozac daily  . Hearing loss    right ear  . History of colon polyps    benign  . Hypertension    takes Metoprolol daily  . Hypokalemia    takes Potassium every other day  . Inflammatory polyps of colon ([place])   . Internal hemorrhoids   . Osteopenia   . Personal history of meningioma of the brain   . Pneumonia    hx of-about 4+ yrs ago  . Seasonal allergies    takes Allegra as needed and Flonase daily  . SVT (supraventricular tachycardia) ([place])   . Tennis elbow   . Ulcerative colitis    takes Lialda daily and Rowasa at bedtime  . Urinary urgency   . Vitamin B12 deficiency   . Von Willebrands disease [facility])      Past Surgical History:  Procedure Laterality Date  . BAHA REVISION Right 03/15/2016   Procedure: REVISION RIGHT  BAHA HEARING IMPLANT ;  Surgeon: Vicie Mutters, MD;  Location: [facility];  Service: ENT;  Laterality: Right;  . BRAIN MENINGIOMA EXCISION  05/2007  . CARDIAC ELECTROPHYSIOLOGY STUDY AND ABLATION  10/22/09  . COLONOSCOPY N/A 07/29/2013   Procedure: COLONOSCOPY;  Surgeon: Lafayette Dragon, MD;  [facility];  Service: Endoscopy;  Laterality: N/A;  . COLONOSCOPY WITH PROPOFOL N/A 09/21/2015   Procedure: COLONOSCOPY WITH PROPOFOL;  Surgeon: Manus Gunning, MD;  [facility];  Service: Gastroenterology;  Laterality: N/A;  . ESOPHAGOGASTRODUODENOSCOPY    . IMPLANTATION BONE ANCHORED HEARING AID Right   . WISDOM TOOTH EXTRACTION     Family History  Problem Relation Age of Onset  . Heart disease Maternal Grandfather   . Heart disease Mother   . Stroke Mother   . Diabetes Maternal Uncle   . Colon  cancer Neg Hx    Social History  Substance Use Topics  . Smoking status: Never Smoker  . Smokeless tobacco: Never Used  . Alcohol use No   Current Outpatient Prescriptions  Medication Sig Dispense Refill  . Calcium Carbonate-Vit D-Min (CALCIUM 1200) 1200-1000 MG-UNIT CHEW Chew 1 tablet by mouth daily.    Marland Kitchen dicyclomine (BENTYL) 10 MG capsule Take 1 capsule (10 mg total) by mouth 4 (four) times daily as needed for spasms. 30 capsule 0  . FLUoxetine (PROZAC) 20 MG capsule TAKE ONE CAPSULE BY MOUTH EVERY DAY 90 capsule 0  . mesalamine (LIALDA) 1.2 G EC tablet Take 4 tablets (4.8 g total) by mouth daily. 120 tablet 11  . metoprolol succinate (TOPROL-XL) 50 MG 24 hr tablet Take 50 mg by mouth daily.    . ondansetron (ZOFRAN) 4 MG tablet Take 1 tablet (4 mg total) by mouth 4 (four) times daily as needed for nausea or vomiting. 20 tablet 0  . potassium chloride SA (K-DUR,KLOR-CON) 20 MEQ tablet TAKE 1 TABLET BY MOUTH EVERY 1 TO 2 DAYS 30 tablet 2  . UCERIS 9 MG TB24 Take 1 tablet by mouth daily.     No current facility-administered medications for this visit.    Allergies  Allergen Reactions  . Atenolol Swelling  . Cephalexin Other (See Comments)    Upset her colitis     Review of Systems: All systems reviewed and negative except where noted in HPI.   Lab Results  Component Value Date   WBC 6.4 07/14/2016   HGB 14.3 07/14/2016   HCT 41.5 07/14/2016   MCV 85.5 07/14/2016   PLT 278.0 07/14/2016    Lab Results  Component Value Date   CREATININE 0.75 07/14/2016   BUN 12 07/14/2016   NA 139 07/14/2016   K 4.1 07/14/2016   CL 103 07/14/2016   CO2 30 07/14/2016    Lab Results  Component Value Date   ALT 14 07/14/2016   AST 14 07/14/2016   ALKPHOS 65 07/14/2016   BILITOT 0.4 07/14/2016      Physical Exam: BP 100/70 (BP Location: Left Arm, Patient Position: Sitting, Cuff Size: Normal)   Pulse 76   Ht 4\' 11"  (1.499 m)   Wt 188 lb 4 oz (85.4 kg)   LMP 11/26/2008   BMI  38.02 kg/m  Constitutional: Pleasant,well-developed, female in no acute distress. HEENT: Normocephalic and atraumatic. Conjunctivae are normal. No scleral icterus. Neck supple.  Cardiovascular: Normal rate, regular rhythm.  Pulmonary/chest: Effort normal and breath sounds normal. No wheezing, rales or rhonchi. Abdominal: Soft, nondistended, nontender.  There are no masses palpable. No hepatomegaly.  Extremities: no edema Lymphadenopathy: No cervical adenopathy noted. Neurological: Alert and oriented to person place and time. Skin: Skin is warm and dry. No rashes noted. Psychiatric: Normal mood and affect. Behavior is normal.   ASSESSMENT AND PLAN: 56 year old female here for follow-up visit:  Left-sided ulcerative colitis - long-standing disease now for greater than 15 years. Her history as outlined above, remarkable for history of Remicade use complicated by severe MRSA infection in the past which led to discontinuation of the drug. Unclear if she had benefit from Imuran historically. She had been maintained on Lialda monotherapy for several years and over this time has done fairly well. She's had 2 flares in the past year however and had mildly active colitis on her last colonoscopy. I discussed options with her moving forward - namely continuing Lialda 4.8 gm / day in addition to continuing Rowasa enemas versus escalating her care and adding another drug to include Entyvio, other anti-TNF, thiopurine, methotrexate, etc. She is hesitant to escalate her care beyond mesalamine at this time, especially with upcoming changes in her insurance, and wants to see how she does with adding Rowasa on a routine basis to her Lialda. If she does flare again despite this at that point in time she will be ready to escalate her care. If she does ad another drug to her regimen her preference would be to use Entyvio given her history of severe infection on anti-TNF. I'm going to recommend colonoscopy in the next 4-6  months on her regimen even if she is feeling well, I want to see if she is in remission on Lialda/Rowasa, and we'll also perform her surveillance given her duration of disease. She was agreeable to this plan.   Cellar, MD [facility]astroenterology Pager [phone]

## 2016-09-11 ENCOUNTER — Telehealth: Payer: Self-pay

## 2016-09-11 ENCOUNTER — Other Ambulatory Visit: Payer: Self-pay

## 2016-09-11 MED ORDER — VITAMIN D (ERGOCALCIFEROL) 1.25 MG (50000 UNIT) PO CAPS: 50000.0000 [IU] | ORAL_CAPSULE | ORAL | 0 refills | Status: DC

## 2016-09-11 NOTE — Telephone Encounter (Signed)
Left message for pt to return call.

## 2016-09-11 NOTE — Telephone Encounter (Signed)
-----   Message from Manus Gunning, MD sent at 09/08/2016  5:25 PM EST ----- Caryl Pina can you please contact this patient and let her know that her vitamin D level is extremely low. Recommend 50,000 units of Vitamin D tablet once weekly for 6 weeks, and then she should taken over the counter vitamin D supplement 2000 units once daily thereafter.   Can you let me know once you have reached her. Thanks

## 2016-09-11 NOTE — Progress Notes (Unsigned)
Left message for pt to return call.

## 2016-09-12 NOTE — Telephone Encounter (Signed)
Left message for pt to return call.

## 2016-09-12 NOTE — Telephone Encounter (Signed)
Pt informed of Vit D level. Rx sent for 50000u yesterday and pt has already picked it up. She was also informed that after taking for 6 weeks that she needs to continue taking 2000u of Vit D daily. Pt understood.

## 2016-09-12 NOTE — Telephone Encounter (Signed)
-----   Message from Manus Gunning, MD sent at 09/08/2016  5:25 PM EST ----- Caryl Pina can you please contact this patient and let her know that her vitamin D level is extremely low. Recommend 50,000 units of Vitamin D tablet once weekly for 6 weeks, and then she should taken over the counter vitamin D supplement 2000 units once daily thereafter.   Can you let me know once you have reached her. Thanks

## 2016-09-12 NOTE — Telephone Encounter (Signed)
Great - thanks

## 2016-09-25 ENCOUNTER — Other Ambulatory Visit: Payer: Self-pay | Admitting: Physician Assistant

## 2016-09-25 DIAGNOSIS — I10 Essential (primary) hypertension: Secondary | ICD-10-CM

## 2016-09-25 NOTE — Telephone Encounter (Signed)
Meds ordered this encounter  Medications  . metoprolol succinate (TOPROL-XL) 50 MG 24 hr tablet    Sig: TAKE 1 TABLET(50 MG) BY MOUTH DAILY    Dispense:  90 tablet    Refill:  0  . FLUoxetine (PROZAC) 20 MG capsule    Sig: TAKE ONE CAPSULE BY MOUTH DAILY    Dispense:  90 capsule    Refill:  0    Patient notified via My Chart.

## 2016-09-26 ENCOUNTER — Encounter: Payer: Self-pay | Admitting: Physician Assistant

## 2016-09-26 ENCOUNTER — Ambulatory Visit (INDEPENDENT_AMBULATORY_CARE_PROVIDER_SITE_OTHER): Payer: 59 | Admitting: Physician Assistant

## 2016-09-26 VITALS — BP 145/85 | HR 66 | Temp 98.3°F | Resp 18 | Ht 59.0 in | Wt 187.0 lb

## 2016-09-26 DIAGNOSIS — D6800 Von Willebrand disease, unspecified: Secondary | ICD-10-CM

## 2016-09-26 DIAGNOSIS — R3129 Other microscopic hematuria: Secondary | ICD-10-CM | POA: Insufficient documentation

## 2016-09-26 DIAGNOSIS — Z0000 Encounter for general adult medical examination without abnormal findings: Secondary | ICD-10-CM | POA: Diagnosis not present

## 2016-09-26 DIAGNOSIS — I10 Essential (primary) hypertension: Secondary | ICD-10-CM | POA: Diagnosis not present

## 2016-09-26 DIAGNOSIS — E559 Vitamin D deficiency, unspecified: Secondary | ICD-10-CM | POA: Insufficient documentation

## 2016-09-26 DIAGNOSIS — E7800 Pure hypercholesterolemia, unspecified: Secondary | ICD-10-CM

## 2016-09-26 DIAGNOSIS — K51819 Other ulcerative colitis with unspecified complications: Secondary | ICD-10-CM

## 2016-09-26 DIAGNOSIS — I495 Sick sinus syndrome: Secondary | ICD-10-CM

## 2016-09-26 LAB — POCT URINALYSIS DIP (MANUAL ENTRY)
Bilirubin, UA: NEGATIVE
Glucose, UA: NEGATIVE
Ketones, POC UA: NEGATIVE
Nitrite, UA: NEGATIVE
Protein Ur, POC: 30 — AB
Spec Grav, UA: 1.025
Urobilinogen, UA: 0.2
pH, UA: 6

## 2016-09-26 NOTE — Progress Notes (Signed)
Patient ID: Lauren [name], female    DOB: [birth year], 56 y.o.   MRN: [id number]  PCP: Harrison Mons, PA-C  Chief Complaint  Patient presents with  . Annual Exam    CPE    Subjective:   Presents for [name] Visit.  Pt is a 56 yo caucasian female who presents for annual wellness exam. Pt states that she is doing well. Has had a recent flare up of her ulcerative colitis with associated diarrhea and mild weight loss. This is being followed by GI.   Pt complains of occasional "unsteadyness" x 1 month. No relieving or aggravating factors. Pt also admits to memory issues that seem to have resolved after the implementation of vitamin D supplement. She denies trauma, headache, LOC, or dizziness.    Cervical Cancer Screening: 10/01/14 Breast Cancer Screening: 09/03/15 Colorectal Cancer Screening: 09/21/15 HIV Screening: 06/17/15 STI Screening: No risk factors. Pt declines. Seasonal Influenza Vaccination: 06/27/16 Td/Tdap Vaccination: 10/27/13 Frequency of Dental evaluation: Q6 months Frequency of Eye evaluation: once/year    Patient Active Problem List   Diagnosis Date Noted  . Ulcerative colitis with complication ([place])   . Benign neoplasm of colon 07/29/2013  . Von Willebrand disease ([place]) 07/17/2013  . Osteoporosis 04/04/2013  . VITAMIN B12 DEFICIENCY 04/05/2010  . SINOATRIAL NODE DYSFUNCTION 10/15/2009  . Reactive depression 09/17/2009  . HEMORRHOIDS-EXTERNAL 05/18/2009  . Anxiety state 02/26/2008  . Essential hypertension 02/26/2008  . Ulcerative colitis ([place]) 02/26/2008  . COUGH, CHRONIC 02/26/2008    Past Medical History:  Diagnosis Date  . Anemia   . Cellulitis of left breast 09/17/2015   diagnosed at Urgent Care- on ABX  . Clotting disorder ([place])   . Depression    takes Prozac daily  . Hearing loss    right ear  . History of colon polyps    benign  . Hypertension    takes Metoprolol daily  . Hypokalemia    takes Potassium every other day  .  Inflammatory polyps of colon ([place])   . Internal hemorrhoids   . Osteopenia   . Personal history of meningioma of the brain   . Pneumonia    hx of-about 4+ yrs ago  . Seasonal allergies    takes Allegra as needed and Flonase daily  . SVT (supraventricular tachycardia) ([place])   . Tennis elbow   . Ulcerative colitis    takes Lialda daily and Rowasa at bedtime  . Urinary urgency   . Vitamin B12 deficiency   . Von Willebrands disease ([place])      Prior to Admission medications   Medication Sig Start Date End Date Taking? Authorizing Provider  Calcium Carbonate-Vit D-Min (CALCIUM 1200) 1200-1000 MG-UNIT CHEW Chew 1 tablet by mouth daily.   Yes Historical Provider, MD  dicyclomine (BENTYL) 10 MG capsule Take 1 capsule (10 mg total) by mouth 4 (four) times daily as needed for spasms. 08/25/16  Yes Ladene Artist, MD  FLUoxetine (PROZAC) 20 MG capsule TAKE ONE CAPSULE BY MOUTH DAILY 09/25/16  Yes Chelle Jeffery, PA-C  mesalamine (LIALDA) 1.2 g EC tablet Take 1 tablet (1.2 g total) by mouth 4 (four) times daily. 09/08/16  Yes Manus Gunning, MD  metoprolol succinate (TOPROL-XL) 50 MG 24 hr tablet Take 50 mg by mouth daily. 12/31/15  Yes Historical Provider, MD  metoprolol succinate (TOPROL-XL) 50 MG 24 hr tablet TAKE 1 TABLET(50 MG) BY MOUTH DAILY 09/25/16  Yes Chelle Jeffery, PA-C  ondansetron (ZOFRAN) 4 MG tablet Take 1  tablet (4 mg total) by mouth 4 (four) times daily as needed for nausea or vomiting. 08/25/16  Yes Ladene Artist, MD  potassium chloride SA (K-DUR,KLOR-CON) 20 MEQ tablet TAKE 1 TABLET BY MOUTH EVERY 1 TO 2 DAYS 08/07/16  Yes Gay Filler Copland, MD  UCERIS 9 MG TB24 Take 1 tablet by mouth daily. 07/22/16  Yes Historical Provider, MD  Vitamin D, Ergocalciferol, (DRISDOL) 50000 units CAPS capsule Take 1 capsule (50,000 Units total) by mouth every 7 (seven) days. 09/11/16  Yes Manus Gunning, MD    Allergies  Allergen Reactions  . Atenolol Swelling  . Cephalexin Other  (See Comments)    Upset her colitis    Past Surgical History:  Procedure Laterality Date  . BAHA REVISION Right 03/15/2016   Procedure: REVISION RIGHT BAHA HEARING IMPLANT ;  Surgeon: Vicie Mutters, MD;  Location: [facility];  Service: ENT;  Laterality: Right;  . BRAIN MENINGIOMA EXCISION  05/2007  . CARDIAC ELECTROPHYSIOLOGY STUDY AND ABLATION  10/22/09  . COLONOSCOPY N/A 07/29/2013   Procedure: COLONOSCOPY;  Surgeon: Lafayette Dragon, MD;  [facility];  Service: Endoscopy;  Laterality: N/A;  . COLONOSCOPY WITH PROPOFOL N/A 09/21/2015   Procedure: COLONOSCOPY WITH PROPOFOL;  Surgeon: Manus Gunning, MD;  [facility];  Service: Gastroenterology;  Laterality: N/A;  . ESOPHAGOGASTRODUODENOSCOPY    . IMPLANTATION BONE ANCHORED HEARING AID Right   . WISDOM TOOTH EXTRACTION      Family History  Problem Relation Age of Onset  . Heart disease Maternal Grandfather   . Heart disease Mother   . Stroke Mother   . Diabetes Maternal Uncle   . Colon cancer Neg Hx     Social History   Social History  . Marital status: Married    Spouse name: N/A  . Number of children: 1  . Years of education: N/A   Occupational History  . Production assistant, radio   . ADM ASSISTANT    Social History Main Topics  . Smoking status: Never Smoker  . Smokeless tobacco: Never Used  . Alcohol use No  . Drug use: No  . Sexual activity: Yes    Partners: Male    Birth control/ protection: Post-menopausal   Other Topics Concern  . None   Social History Narrative  . None    Review of Systems In addition to that stated in HPI above: Const: Weight loss Denies fever, chills, or fatigue. HEENT: Denies changes in vision or hearing. Pulm: Denies cough or SOB. CV: Denies chest pain or palpitations.  Abd: Denies abdominal pain, nausea, vomiting, or constipation.      Objective:  Physical Exam HEENT: PERRLA. Throat nonerythematous, no exudates. Ear canals clear bilaterally, TMs  intact, nonbulging. Hearing aid in place at right ear. No lymphadenopathy, no tenderness to palpation of neck. Pulm: Good respiratory effort. No wheezes, rales, or rhonchi. CV: RRR. No M/R/G. Abd: Soft, nontender, nondistended. + BS x 4 quadrants. Neuro: A&O x3. Negative Romberg test. DTRs intact at brachioradialis, patellar, and achilles.     Assessment & Plan:   1. Annual physical exam Pt appears well at this time pending lab results.   2. Essential hypertension Pt advised to continue medications as direct pending labs.  - POCT urinalysis dipstick  3. Vitamin D insufficiency Pt takes vitamin D supplement. Managed and followed by GI.  4. Elevated cholesterol Mildly elevated/abnormal lipid panel on 06/17/15. Pending labs.  - Lipid panel; Future  5. Von Willebrand disease ([place]) Not  currently being managed by a provider.  6. Other ulcerative colitis with complication ([facility]) In acute exacerbation currently. Managed and followed by GI.   7. History of SINOATRIAL NODE DYSFUNCTION Repaired by ablation procedure. Followed by cardiology.   8. Microscopic hematuria Pt has appointment with Urology next month for chronic microscopic hematuria.   Lorella Nimrod, PA-S

## 2016-09-26 NOTE — Progress Notes (Signed)
Patient ID: Lauren [name], female    DOB: 31-Jul-1961, 56 y.o.   MRN: [id number]  PCP: Porfirio Oar, PA-C  Chief Complaint  Patient presents with  . Annual Exam    CPE    Subjective:   Presents for [employer] Visit.   Cervical Cancer Screening: 10/01/2014 with GYN Breast Cancer Screening: mammogram 09/03/2015 Colorectal Cancer Screening: colonoscopy 09/21/2015. Patient with colitis. Recent flare. Bone Density Testing: not yet HIV Screening: very low risk. Completed 2016 STI Screening: very low risk Seasonal Influenza Vaccination: current Td/Tdap Vaccination: current 10/2013 Pneumococcal Vaccination: not yet a candidate Zoster Vaccination: not yet a candidate Frequency of Dental evaluation: Q6 months Frequency of Eye evaluation: annually  Has been seen regularly by GI due to recent colitis flare. Caused some weight loss. Also notes some occasional, brief unsteadiness x 1 month. No falls. Was experiencing some memory difficulty, but that resolved with Vitamin D supplementation.    Patient Active Problem List   Diagnosis Date Noted  . Ulcerative colitis with complication (HCC)   . Benign neoplasm of colon 07/29/2013  . Von Willebrand disease (HCC) 07/17/2013  . Osteoporosis 04/04/2013  . VITAMIN B12 DEFICIENCY 04/05/2010  . SINOATRIAL NODE DYSFUNCTION 10/15/2009  . Reactive depression 09/17/2009  . HEMORRHOIDS-EXTERNAL 05/18/2009  . Anxiety state 02/26/2008  . Essential hypertension 02/26/2008  . Ulcerative colitis (HCC) 02/26/2008  . COUGH, CHRONIC 02/26/2008    Past Medical History:  Diagnosis Date  . Anemia   . Cellulitis of left breast 09/17/2015   diagnosed at Urgent Care- on ABX  . Clotting disorder (HCC)   . Depression    takes Prozac daily  . Hearing loss    right ear  . History of colon polyps    benign  . Hypertension    takes Metoprolol daily  . Hypokalemia    takes Potassium every other day  . Inflammatory polyps of colon (HCC)   .  Internal hemorrhoids   . Osteopenia   . Personal history of meningioma of the brain   . Pneumonia    hx of-about 4+ yrs ago  . Seasonal allergies    takes Allegra as needed and Flonase daily  . SVT (supraventricular tachycardia) (HCC)   . Tennis elbow   . Ulcerative colitis    takes Lialda daily and Rowasa at bedtime  . Urinary urgency   . Vitamin B12 deficiency   . Von Willebrands disease (HCC)      Prior to Admission medications   Medication Sig Start Date End Date Taking? Authorizing Provider  Calcium Carbonate-Vit D-Min (CALCIUM 1200) 1200-1000 MG-UNIT CHEW Chew 1 tablet by mouth daily.   Yes Historical Provider, MD  dicyclomine (BENTYL) 10 MG capsule Take 1 capsule (10 mg total) by mouth 4 (four) times daily as needed for spasms. 08/25/16  Yes Meryl Dare, MD  FLUoxetine (PROZAC) 20 MG capsule TAKE ONE CAPSULE BY MOUTH DAILY 09/25/16  Yes Kartier Bennison, PA-C  mesalamine (LIALDA) 1.2 g EC tablet Take 1 tablet (1.2 g total) by mouth 4 (four) times daily. 09/08/16  Yes Ruffin Frederick, MD  metoprolol succinate (TOPROL-XL) 50 MG 24 hr tablet Take 50 mg by mouth daily. 12/31/15  Yes Historical Provider, MD  metoprolol succinate (TOPROL-XL) 50 MG 24 hr tablet TAKE 1 TABLET(50 MG) BY MOUTH DAILY 09/25/16  Yes Caedence Snowden, PA-C  ondansetron (ZOFRAN) 4 MG tablet Take 1 tablet (4 mg total) by mouth 4 (four) times daily as needed for nausea or vomiting. 08/25/16  Yes Meryl Dare, MD  potassium chloride SA (K-DUR,KLOR-CON) 20 MEQ tablet TAKE 1 TABLET BY MOUTH EVERY 1 TO 2 DAYS 08/07/16  Yes Gwenlyn Found Copland, MD  UCERIS 9 MG TB24 Take 1 tablet by mouth daily. 07/22/16  Yes Historical Provider, MD  Vitamin D, Ergocalciferol, (DRISDOL) 50000 units CAPS capsule Take 1 capsule (50,000 Units total) by mouth every 7 (seven) days. 09/11/16  Yes Ruffin Frederick, MD    Allergies  Allergen Reactions  . Atenolol Swelling  . Cephalexin Other (See Comments)    Upset her colitis     Past Surgical History:  Procedure Laterality Date  . BAHA REVISION Right 03/15/2016   Procedure: REVISION RIGHT BAHA HEARING IMPLANT ;  Surgeon: Ermalinda Barrios, MD;  Location: [facility] OR;  Service: ENT;  Laterality: Right;  . BRAIN MENINGIOMA EXCISION  05/2007  . CARDIAC ELECTROPHYSIOLOGY STUDY AND ABLATION  10/22/09  . COLONOSCOPY N/A 07/29/2013   Procedure: COLONOSCOPY;  Surgeon: Hart Carwin, MD;  Location: [facility];  Service: Endoscopy;  Laterality: N/A;  . COLONOSCOPY WITH PROPOFOL N/A 09/21/2015   Procedure: COLONOSCOPY WITH PROPOFOL;  Surgeon: Ruffin Frederick, MD;  Location: [facility];  Service: Gastroenterology;  Laterality: N/A;  . ESOPHAGOGASTRODUODENOSCOPY    . IMPLANTATION BONE ANCHORED HEARING AID Right   . WISDOM TOOTH EXTRACTION      Family History  Problem Relation Age of Onset  . Heart disease Maternal Grandfather   . Heart disease Mother   . Stroke Mother   . Diabetes Maternal Uncle   . Colon cancer Neg Hx     Social History   Social History  . Marital status: Married    Spouse name: N/A  . Number of children: 1  . 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 Smoking status: Never Smoker  . Smokeless tobacco: Never Used  . Alcohol use No  . Drug use: No  . Sexual activity: Yes    Partners: Male    Birth control/ protection: Post-menopausal   Other Topics Concern  . None   Social History Narrative  . None       Review of Systems  Constitutional: Positive for unexpected weight change (weight loss with recent colitis flare).  HENT: Negative.   Eyes: Negative.   Respiratory: Negative.   Cardiovascular: Negative.   Gastrointestinal: Negative.   Genitourinary: Negative.   Musculoskeletal: Negative.   Skin: Negative.   Neurological: Negative.  Negative for weakness.       Memory problem resolved. Unsteadiness  Psychiatric/Behavioral: Negative.          Objective:  Physical Exam  Constitutional: She is oriented to person, place, and time. Vital signs are normal. She appears well-developed and well-nourished. She is active and cooperative. No distress.  BP (!) 145/85   Pulse 66   Temp 98.3 F (36.8 C) (Oral)   Resp 18   Ht 4\' 11"  (1.499 m)   Wt 187 lb (84.8 kg)   LMP 11/26/2008   SpO2 95%   BMI 37.77 kg/m    HENT:  Head: Normocephalic and atraumatic.  Right Ear: Hearing, tympanic membrane, external ear and ear canal normal. No foreign bodies.  Left Ear: Hearing, tympanic membrane, external ear and ear canal normal. No foreign bodies.  Nose: Nose normal.  Mouth/Throat: Uvula is midline, oropharynx is clear and moist and mucous membranes are normal. No oral  lesions. Normal dentition. No dental abscesses or uvula swelling. No oropharyngeal exudate.  Eyes: Conjunctivae, EOM and lids are normal. Pupils are equal, round, and reactive to light. Right eye exhibits no discharge. Left eye exhibits no discharge. No scleral icterus.  Fundoscopic exam:      The right eye shows no arteriolar narrowing, no AV nicking, no exudate, no hemorrhage and no papilledema. The right eye shows red reflex.       The left eye shows no arteriolar narrowing, no AV nicking, no exudate, no hemorrhage and no papilledema. The left eye shows red reflex.  Neck: Trachea normal, normal range of motion and full passive range of motion without pain. Neck supple. No spinous process tenderness and no muscular tenderness present. No thyroid mass and no thyromegaly present.  Cardiovascular: Normal rate, regular rhythm, normal heart sounds, intact distal pulses and normal pulses.   Pulmonary/Chest: Effort normal and breath sounds normal. Right breast exhibits no inverted nipple, no mass, no nipple discharge, no skin change and no tenderness. Left breast exhibits no inverted nipple, no mass, no nipple discharge, no skin change and no tenderness. Breasts are symmetrical.   Musculoskeletal: She exhibits no edema or tenderness.       Cervical back: Normal.       Thoracic back: Normal.       Lumbar back: Normal.  Lymphadenopathy:       Head (right side): No tonsillar, no preauricular, no posterior auricular and no occipital adenopathy present.       Head (left side): No tonsillar, no preauricular, no posterior auricular and no occipital adenopathy present.    She has no cervical adenopathy.       Right: No supraclavicular adenopathy present.       Left: No supraclavicular adenopathy present.  Neurological: She is alert and oriented to person, place, and time. She has normal strength and normal reflexes. No cranial nerve deficit. She exhibits normal muscle tone. Coordination and gait normal.  Skin: Skin is warm, dry and intact. No rash noted. She is not diaphoretic. No cyanosis or erythema. Nails show no clubbing.  Psychiatric: She has a normal mood and affect. Her speech is normal and behavior is normal. Judgment and thought content normal.       Wt Readings from Last 3 Encounters:  09/26/16 187 lb (84.8 kg)  09/08/16 188 lb 4 oz (85.4 kg)  07/14/16 191 lb (86.6 kg)       Assessment & Plan:  1. Annual physical exam Age appropriate anticipatory guidance provided.  2. Essential hypertension Controlled. - POCT urinalysis dipstick  3. Vitamin D insufficiency Await lab results.  4. Elevated cholesterol Return for fasting labs. Await results. - Lipid panel; Future  5. Von Willebrand disease (HCC) Stable. Managed by oncology.  6. Other ulcerative colitis with complication (HCC) Recent flare, resolving. Managed by GI.  7. SINOATRIAL NODE DYSFUNCTION Resolved since ablation.  8. Microscopic hematuria Chronic. Has visit scheduled with urology.   Fernande Bras, PA-C Physician Assistant-Certified Primary Care at [facility]

## 2016-09-26 NOTE — Patient Instructions (Addendum)
   IF you received an x-ray today, you will receive an invoice from [facility]. Please contact [facility] at [phone] with questions or concerns regarding your invoice.   IF you received labwork today, you will receive an invoice from [facility]. Please contact [facility] at [phone] with questions or concerns regarding your invoice.   Our billing staff will not be able to assist you with questions regarding bills from these companies.  You will be contacted with the lab results as soon as they are available. The fastest way to get your results is to activate your My Chart account. Instructions are located on the last page of this paperwork. If you have not heard from us regarding the results in 2 weeks, please contact this office.     Keeping You Healthy  Get These Tests  Blood Pressure- Have your blood pressure checked by your healthcare provider at least once a year.  Normal blood pressure is 120/80.  Weight- Have your body mass index (BMI) calculated to screen for obesity.  BMI is a measure of body fat based on height and weight.  You can calculate your own BMI at www.nhlbisupport.com/bmi/  Cholesterol- Have your cholesterol checked every year.  Diabetes- Have your blood sugar checked every year if you have high blood pressure, high cholesterol, a family history of diabetes or if you are overweight.  Pap Test - Have a pap test every 1 to 5 years if you have been sexually active.  If you are older than 65 and recent pap tests have been normal you may not need additional pap tests.  In addition, if you have had a hysterectomy  for benign disease additional pap tests are not necessary.  Mammogram-Yearly mammograms are essential for early detection of breast cancer  Screening for Colon Cancer- Colonoscopy starting at age 50. Screening may begin sooner depending on your family history and other health conditions.  Follow up colonoscopy as directed by your  Gastroenterologist.  Screening for Osteoporosis- Screening begins at age 65 with bone density scanning, sooner if you are at higher risk for developing Osteoporosis.  Get these medicines  Calcium with Vitamin D- Your body requires 1200-1500 mg of Calcium a day and 800-1000 IU of Vitamin D a day.  You can only absorb 500 mg of Calcium at a time therefore Calcium must be taken in 2 or 3 separate doses throughout the day.  Hormones- Hormone therapy has been associated with increased risk for certain cancers and heart disease.  Talk to your healthcare provider about if you need relief from menopausal symptoms.  Aspirin- Ask your healthcare provider about taking Aspirin to prevent Heart Disease and Stroke.  Get these Immuniztions  Flu shot- Every fall  Pneumonia shot- Once after the age of 65; if you are younger ask your healthcare provider if you need a pneumonia shot.  Tetanus- Every ten years.  Zostavax- Once after the age of 60 to prevent shingles.  Take these steps  Don't smoke- Your healthcare provider can help you quit. For tips on how to quit, ask your healthcare provider or go to www.smokefree.gov or call 1-800 QUIT-NOW.  Be physically active- Exercise 5 days a week for a minimum of 30 minutes.  If you are not already physically active, start slow and gradually work up to 30 minutes of moderate physical activity.  Try walking, dancing, bike riding, swimming, etc.  Eat a healthy diet- Eat a variety of healthy foods such as fruits, vegetables, whole grains, low   fat milk, low fat cheeses, yogurt, lean meats, chicken, fish, eggs, dried beans, tofu, etc.  For more information go to www.thenutritionsource.org  Dental visit- Brush and floss teeth twice daily; visit your dentist twice a year.  Eye exam- Visit your Optometrist or Ophthalmologist yearly.  Drink alcohol in moderation- Limit alcohol intake to one drink or less a day.  Never drink and drive.  Depression- Your emotional  health is as important as your physical health.  If you're feeling down or losing interest in things you normally enjoy, please talk to your healthcare provider.  Seat Belts- can save your life; always wear one  Smoke/Carbon Monoxide detectors- These detectors need to be installed on the appropriate level of your home.  Replace batteries at least once a year.  Violence- If anyone is threatening or hurting you, please tell your healthcare provider.  Living Will/ Health care power of attorney- Discuss with your healthcare provider and family.  

## 2016-10-03 ENCOUNTER — Other Ambulatory Visit: Payer: Self-pay

## 2016-10-03 ENCOUNTER — Telehealth: Payer: Self-pay

## 2016-10-03 DIAGNOSIS — R197 Diarrhea, unspecified: Secondary | ICD-10-CM

## 2016-10-03 MED ORDER — HYOSCYAMINE SULFATE 0.125 MG SL SUBL: 0.1250 mg | SUBLINGUAL_TABLET | Freq: Four times a day (QID) | SUBLINGUAL | 0 refills | Status: DC | PRN

## 2016-10-03 NOTE — Telephone Encounter (Signed)
Spoke to patient, she states that for last 4-5 of days has had some light, rectal bleeding, increase in soft to loose stools to 7-8 per day. Denies any fever, or abdominal pain. She can feel intestinal spasms, has Bentyl for this but is hesitant to take due to urinary incontinence that this med causes her. Finished her Uceris 3 days ago, some of these symptoms started prior to her stopping this medication. She is taking Lialda 4.8 g per day, and Rowasa enemas every other day. Let her know that Dr. Havery Moros is currently out of town. Please advise. Thank you.

## 2016-10-03 NOTE — Telephone Encounter (Signed)
Left message for patient to repeat C diff stool test, discontinue taking Bentyl and try Levsin. Continue taking Lialda. Rx sent in for Levsin, lab test ordered.

## 2016-10-03 NOTE — Telephone Encounter (Signed)
Would repeat C. difficile PCR Continue Lialda 4.8 g daily Can discontinue Bentyl and try Levsin 0.125, 1-2 tablets sublingual every 6-8 hours as needed for crampy abdominal pain May need to resume Uceris, but await stool test

## 2016-10-09 ENCOUNTER — Other Ambulatory Visit: Payer: No Typology Code available for payment source

## 2016-10-09 ENCOUNTER — Telehealth: Payer: Self-pay | Admitting: Gastroenterology

## 2016-10-09 ENCOUNTER — Other Ambulatory Visit: Payer: Self-pay

## 2016-10-09 DIAGNOSIS — Z9889 Other specified postprocedural states: Secondary | ICD-10-CM

## 2016-10-09 DIAGNOSIS — R197 Diarrhea, unspecified: Secondary | ICD-10-CM

## 2016-10-09 LAB — C. DIFFICILE GDH AND TOXIN A/B
C. difficile GDH: NOT DETECTED
C. difficile Toxin A/B: NOT DETECTED

## 2016-10-10 ENCOUNTER — Other Ambulatory Visit: Payer: Self-pay | Admitting: Gastroenterology

## 2016-10-10 ENCOUNTER — Other Ambulatory Visit (INDEPENDENT_AMBULATORY_CARE_PROVIDER_SITE_OTHER): Payer: 59

## 2016-10-10 ENCOUNTER — Other Ambulatory Visit: Payer: Self-pay

## 2016-10-10 DIAGNOSIS — K51919 Ulcerative colitis, unspecified with unspecified complications: Secondary | ICD-10-CM

## 2016-10-10 LAB — COMPREHENSIVE METABOLIC PANEL
ALT: 12 U/L (ref 0–35)
AST: 16 U/L (ref 0–37)
Albumin: 3.9 g/dL (ref 3.5–5.2)
Alkaline Phosphatase: 60 U/L (ref 39–117)
BUN: 10 mg/dL (ref 6–23)
CO2: 30 mEq/L (ref 19–32)
Calcium: 9.6 mg/dL (ref 8.4–10.5)
Chloride: 101 mEq/L (ref 96–112)
Creatinine, Ser: 0.77 mg/dL (ref 0.40–1.20)
GFR: 82.53 mL/min (ref >60.00)
Glucose, Bld: 123 mg/dL — ABNORMAL HIGH (ref 70–99)
Potassium: 4.2 mEq/L (ref 3.5–5.1)
Sodium: 138 mEq/L (ref 135–145)
Total Bilirubin: 0.4 mg/dL (ref 0.2–1.2)
Total Protein: 7.2 g/dL (ref 6.0–8.3)

## 2016-10-10 LAB — CBC WITH DIFFERENTIAL/PLATELET
Basophils Absolute: 0 10*3/uL (ref 0.0–0.1)
Basophils Relative: 0.7 % (ref 0.0–3.0)
Eosinophils Absolute: 0.2 10*3/uL (ref 0.0–0.7)
Eosinophils Relative: 2.8 % (ref 0.0–5.0)
HCT: 43.4 % (ref 36.0–46.0)
Hemoglobin: 14.7 g/dL (ref 12.0–15.0)
Lymphocytes Relative: 27.9 % (ref 12.0–46.0)
Lymphs Abs: 1.9 10*3/uL (ref 0.7–4.0)
MCHC: 33.8 g/dL (ref 30.0–36.0)
MCV: 86.9 fl (ref 78.0–100.0)
Monocytes Absolute: 0.7 10*3/uL (ref 0.1–1.0)
Monocytes Relative: 10 % (ref 3.0–12.0)
Neutro Abs: 4.1 10*3/uL (ref 1.4–7.7)
Neutrophils Relative %: 58.6 % (ref 43.0–77.0)
Platelets: 282 10*3/uL (ref 150.0–400.0)
RBC: 4.99 Mil/uL (ref 3.87–5.11)
RDW: 14.3 % (ref 11.5–15.5)
WBC: 7 10*3/uL (ref 4.0–10.5)

## 2016-10-10 LAB — HIGH SENSITIVITY CRP: CRP, High Sensitivity: 9.28 mg/L — ABNORMAL HIGH (ref 0.000–5.000)

## 2016-10-10 MED ORDER — PREDNISONE 10 MG PO TABS: 40.0000 mg | ORAL_TABLET | Freq: Every day | ORAL | 1 refills | Status: AC

## 2016-10-10 NOTE — Progress Notes (Unsigned)
Per Dr Havery Moros ordered CBC, CMP, CRP (high sensitivity) Pt informed.

## 2016-10-11 ENCOUNTER — Other Ambulatory Visit: Payer: Self-pay

## 2016-10-11 DIAGNOSIS — K51919 Ulcerative colitis, unspecified with unspecified complications: Secondary | ICD-10-CM

## 2016-10-11 MED ORDER — PREDNISONE 5 MG PO TABS: 35.0000 mg | ORAL_TABLET | Freq: Every day | ORAL | 0 refills | Status: DC

## 2016-10-11 NOTE — Telephone Encounter (Signed)
Left message for patient to call back on 2/12, have not heard back from her.

## 2016-10-13 ENCOUNTER — Telehealth: Payer: Self-pay

## 2016-10-13 ENCOUNTER — Other Ambulatory Visit: Payer: Self-pay | Admitting: Physician Assistant

## 2016-10-13 DIAGNOSIS — Z1231 Encounter for screening mammogram for malignant neoplasm of breast: Secondary | ICD-10-CM

## 2016-10-13 NOTE — Telephone Encounter (Signed)
Patient called back, she understands the taper dosage of prednisone. She states that she is uncertain at this point if the insurance will be changing, therefore would like to go ahead and start the prior auth process for Entyvio. She will come get her lab work on Monday. Patient is questioning if she should have her vitamin D level rechecked. She states that when she was on prednisone previously her Vit. D level dropped and would have to be on higher levels. She will be out of her Vit. D 50,000 IU this week. Please advise.

## 2016-10-13 NOTE — Telephone Encounter (Signed)
Thanks Almyra Free, I spoke with Carla Drape who also got ahold of her Instructed her to go to lab for TB testing That's great she will start process for Entyvio I would recommend she take vitamin D 2000 IU daily and we will recheck vitamin D in a few months Hopefully she feels better on prednisone, she should continue 40mg  daily x 2 weeks and then taper by 5mg  until gone.   She is aware of all of this, don't need to call back. Thanks

## 2016-10-16 ENCOUNTER — Other Ambulatory Visit: Payer: No Typology Code available for payment source

## 2016-10-16 DIAGNOSIS — K51919 Ulcerative colitis, unspecified with unspecified complications: Secondary | ICD-10-CM

## 2016-10-18 LAB — QUANTIFERON TB GOLD ASSAY (BLOOD)
Interferon Gamma Release Assay: NEGATIVE
Mitogen-Nil: 8.56 IU/mL
Quantiferon Nil Value: 0.01 IU/mL
Quantiferon Tb Ag Minus Nil Value: 0 IU/mL

## 2016-10-19 ENCOUNTER — Other Ambulatory Visit: Payer: Self-pay

## 2016-10-19 DIAGNOSIS — K51919 Ulcerative colitis, unspecified with unspecified complications: Secondary | ICD-10-CM

## 2016-10-22 ENCOUNTER — Other Ambulatory Visit: Payer: Self-pay | Admitting: Gastroenterology

## 2016-10-24 ENCOUNTER — Other Ambulatory Visit: Payer: Self-pay

## 2016-10-24 ENCOUNTER — Telehealth: Payer: Self-pay

## 2016-10-24 MED ORDER — VEDOLIZUMAB 300 MG IV SOLR: 300.0000 mg | INTRAVENOUS | 6 refills | Status: DC

## 2016-10-24 NOTE — Telephone Encounter (Signed)
Lauren [name] can you please let the patient know if she has completed the high dose vitamin D, she should then take 2000 IU vitamin D daily thereafter, which can be purchased OTC. I will recheck her vitamin D level in 1-2 months and we can then determine if she needs another course of high dose for repletion. thanks

## 2016-10-24 NOTE — Telephone Encounter (Signed)
Spoke to patient, let her know that she is approved to start the Entyvio infusions. She should expect a call from [facility] to schedule infusion, will need induction dosage of week 0,2,6 then every 8 weeks. Faxed referral and information/order to [place]: [phone] attn: [facility].

## 2016-10-25 ENCOUNTER — Telehealth: Payer: Self-pay

## 2016-10-25 NOTE — Telephone Encounter (Signed)
Pt has an appt with [facility] 11/02/16 at 1pm with Dr Dossie Der. Left message informing pt of this and that she will need to arrive 15 minutes early, have ins card, id and copayment.

## 2016-10-31 ENCOUNTER — Other Ambulatory Visit: Payer: Self-pay | Admitting: Family Medicine

## 2016-10-31 DIAGNOSIS — E878 Other disorders of electrolyte and fluid balance, not elsewhere classified: Secondary | ICD-10-CM

## 2016-11-03 ENCOUNTER — Ambulatory Visit
Admission: RE | Admit: 2016-11-03 | Discharge: 2016-11-03 | Disposition: A | Discharge status: Home / Self Care | Payer: 59 | Source: Ambulatory Visit | Attending: Physician Assistant | Admitting: Physician Assistant

## 2016-11-03 DIAGNOSIS — Z1231 Encounter for screening mammogram for malignant neoplasm of breast: Secondary | ICD-10-CM

## 2016-11-04 ENCOUNTER — Encounter: Payer: Self-pay | Admitting: Gastroenterology

## 2016-11-07 ENCOUNTER — Other Ambulatory Visit: Payer: Self-pay

## 2016-11-07 MED ORDER — PREDNISONE 5 MG PO TABS: 40.0000 mg | ORAL_TABLET | Freq: Every day | ORAL | 0 refills | Status: DC

## 2016-11-11 ENCOUNTER — Other Ambulatory Visit (INDEPENDENT_AMBULATORY_CARE_PROVIDER_SITE_OTHER): Payer: 59 | Admitting: Physician Assistant

## 2016-11-11 DIAGNOSIS — E7800 Pure hypercholesterolemia, unspecified: Secondary | ICD-10-CM

## 2016-11-12 LAB — LIPID PANEL
Chol/HDL Ratio: 3 ratio units (ref 0.0–4.4)
Cholesterol, Total: 192 mg/dL (ref 100–199)
HDL: 64 mg/dL (ref >39)
LDL Calculated: 103 mg/dL — ABNORMAL HIGH (ref 0–99)
Triglycerides: 127 mg/dL (ref 0–149)
VLDL Cholesterol Cal: 25 mg/dL (ref 5–40)

## 2016-12-18 ENCOUNTER — Other Ambulatory Visit: Payer: Self-pay | Admitting: Physician Assistant

## 2016-12-18 DIAGNOSIS — I10 Essential (primary) hypertension: Secondary | ICD-10-CM

## 2017-02-09 ENCOUNTER — Ambulatory Visit (INDEPENDENT_AMBULATORY_CARE_PROVIDER_SITE_OTHER): Payer: 59 | Admitting: Physician Assistant

## 2017-02-09 ENCOUNTER — Encounter: Payer: Self-pay | Admitting: Physician Assistant

## 2017-02-09 VITALS — BP 122/77 | HR 65 | Temp 98.5°F | Resp 18 | Ht 59.0 in | Wt 184.0 lb

## 2017-02-09 DIAGNOSIS — M76891 Other specified enthesopathies of right lower limb, excluding foot: Secondary | ICD-10-CM | POA: Diagnosis not present

## 2017-02-09 DIAGNOSIS — IMO0001 Reserved for inherently not codable concepts without codable children: Secondary | ICD-10-CM

## 2017-02-09 DIAGNOSIS — R220 Localized swelling, mass and lump, head: Secondary | ICD-10-CM | POA: Diagnosis not present

## 2017-02-09 DIAGNOSIS — R209 Unspecified disturbances of skin sensation: Secondary | ICD-10-CM

## 2017-02-09 NOTE — Progress Notes (Signed)
Patient ID: Lauren [name], female    DOB: February 27, 1961, 57 y.o.   MRN: [id number]  PCP: Porfirio Oar, PA-C  Chief Complaint  Patient presents with  . Numbness    x3 weeks, numbness in both hands, per PT tingly feeling and numbness, happening randomly    Subjective:   Presents for evaluation of hand numbness.  3 weeks of intermittend tingling of her hands. Can wake her from sleep. Shakes it off [place] itches  Also increased generalized joint pains and stiffness x several weeks. RIGHT thigh/groin pain with hip flexion when walking.  Face is fat. Swollen. Oral prednisone about a month ago. RIGHT face, especially under the eye, seems like it's retaining fluids.   Review of Systems As above. No CP, SOB, HA, dizziness. No GI/GU symptoms.    Patient Active Problem List   Diagnosis Date Noted  . Vitamin D insufficiency 09/26/2016  . Microscopic hematuria 09/26/2016  . Ulcerative colitis with complication (HCC)   . Benign neoplasm of colon 07/29/2013  . Von Willebrand disease (HCC) 07/17/2013  . Osteoporosis 04/04/2013  . VITAMIN B12 DEFICIENCY 04/05/2010  . Sinoatrial node dysfunction (HCC) 10/15/2009  . Reactive depression 09/17/2009  . HEMORRHOIDS-EXTERNAL 05/18/2009  . Anxiety state 02/26/2008  . Essential hypertension 02/26/2008  . Ulcerative colitis (HCC) 02/26/2008  . COUGH, CHRONIC 02/26/2008     Prior to Admission medications   Medication Sig Start Date End Date Taking? Authorizing Provider  Calcium Carbonate-Vit D-Min (CALCIUM 1200) 1200-1000 MG-UNIT CHEW Chew 1 tablet by mouth daily.   Yes [provider]  dicyclomine (BENTYL) 10 MG capsule Take 1 capsule (10 mg total) by mouth 4 (four) times daily as needed for spasms. 08/25/16  Yes Meryl Dare, MD  FLUoxetine (PROZAC) 20 MG capsule TAKE ONE CAPSULE BY MOUTH DAILY 12/18/16  Yes Jezlyn Westerfield, PA-C  hyoscyamine (LEVSIN SL) 0.125 MG SL tablet Place 1 tablet (0.125 mg total) under the  tongue every 6 (six) hours as needed. 10/03/16  Yes Pyrtle, Carie Caddy, MD  mesalamine (LIALDA) 1.2 g EC tablet Take 1 tablet (1.2 g total) by mouth 4 (four) times daily. 09/08/16  Yes Armbruster, Reeves Forth, MD  metoprolol succinate (TOPROL-XL) 50 MG 24 hr tablet Take 50 mg by mouth daily. 12/31/15  Yes [provider]  ondansetron (ZOFRAN) 4 MG tablet Take 1 tablet (4 mg total) by mouth 4 (four) times daily as needed for nausea or vomiting. 08/25/16  Yes Meryl Dare, MD  vedolizumab (ENTYVIO) 300 MG injection Inject 300 mg into the vein as directed. Patient will need induction scheduled at week 0, 2,6 then every 8 weeks. 10/24/16  Yes Armbruster, Reeves Forth, MD  metoprolol succinate (TOPROL-XL) 50 MG 24 hr tablet TAKE 1 TABLET(50 MG) BY MOUTH DAILY 12/18/16   Rhyen Mazariego, PA-C  potassium chloride SA (K-DUR,KLOR-CON) 20 MEQ tablet TAKE 1 TABLET BY MOUTH EVERY 1 TO 2 DAYS Patient not taking: Reported on 02/09/2017 08/07/16   Copland, Gwenlyn Found, MD  Vitamin D, Ergocalciferol, (DRISDOL) 50000 units CAPS capsule Take 1 capsule (50,000 Units total) by mouth every 7 (seven) days. Patient not taking: Reported on 02/09/2017 09/11/16   Armbruster, Reeves Forth, MD     Allergies  Allergen Reactions  . Atenolol Swelling  . Cephalexin Other (See Comments)    Upset her colitis       Objective:  Physical Exam  Constitutional: She is oriented to person, place, and time. She appears well-developed and well-nourished. She is active  and cooperative. No distress.  BP 122/77 (BP Location: Right Arm, Patient Position: Sitting, Cuff Size: Normal)   Pulse 65   Temp 98.5 F (36.9 C) (Oral)   Resp 18   Ht 4\' 11"  (1.499 m)   Wt 184 lb (83.5 kg)   LMP 11/26/2008   SpO2 95%   BMI 37.16 kg/m   HENT:  Head: Normocephalic and atraumatic.  Right Ear: Hearing normal.  Left Ear: Hearing normal.  Eyes: Conjunctivae are normal. No scleral icterus.  Neck: Normal range of motion. Neck supple. No thyromegaly  present.  Cardiovascular: Normal rate, regular rhythm and normal heart sounds.   Pulses:      Radial pulses are 2+ on the right side, and 2+ on the left side.  Pulmonary/Chest: Effort normal and breath sounds normal.  Musculoskeletal:       Right wrist: Normal.       Left wrist: Normal.       Right hip: She exhibits normal range of motion, normal strength, no tenderness, no bony tenderness, no swelling, no crepitus, no deformity and no laceration.       Left hip: Normal.       Right forearm: Normal.       Left forearm: Normal.       Right hand: Normal.       Left hand: Normal.  Pain of the RIGHT hip flexor with strength testing.  Lymphadenopathy:       Head (right side): No tonsillar, no preauricular, no posterior auricular and no occipital adenopathy present.       Head (left side): No tonsillar, no preauricular, no posterior auricular and no occipital adenopathy present.    She has no cervical adenopathy.       Right: No supraclavicular adenopathy present.       Left: No supraclavicular adenopathy present.  Neurological: She is alert and oriented to person, place, and time. She has normal strength. No sensory deficit.  Reflex Scores:      Bicep reflexes are 2+ on the right side and 2+ on the left side. Phalen's/Tinel's do not reproduce symptoms.  Skin: Skin is warm, dry and intact. No rash noted. No cyanosis or erythema. Nails show no clubbing.  Psychiatric: She has a normal mood and affect. Her speech is normal and behavior is normal.           Assessment & Plan:   1. Paresthesias/numbness Possible CTS. Await labs. Try wrist splints. - CBC with Differential/Platelet - Comprehensive metabolic panel - TSH - T4, free - ANA - Rheumatoid factor - Sedimentation rate - Splint wrist  2. Facial swelling Not appreciated on exam, but obvious to patient. Await labs. - CBC with Differential/Platelet - Comprehensive metabolic panel - TSH - T4, free - ANA - Rheumatoid  factor - Sedimentation rate - Splint wrist  3. Tendinitis of right hip flexor Rest. Ice. If persists, consider PT.    Return if symptoms worsen or fail to improve.   Fernande Bras, PA-C Primary Care at [facility]

## 2017-02-09 NOTE — Patient Instructions (Signed)
     IF you received an x-ray today, you will receive an invoice from [facility]. Please contact [facility] at [phone] with questions or concerns regarding your invoice.   IF you received labwork today, you will receive an invoice from [facility]. Please contact [facility] at [phone] with questions or concerns regarding your invoice.   Our billing staff will not be able to assist you with questions regarding bills from these companies.  You will be contacted with the lab results as soon as they are available. The fastest way to get your results is to activate your My Chart account. Instructions are located on the last page of this paperwork. If you have not heard from us regarding the results in 2 weeks, please contact this office.     

## 2017-02-13 LAB — COMPREHENSIVE METABOLIC PANEL
ALT: 14 IU/L (ref 0–32)
AST: 21 IU/L (ref 0–40)
Albumin/Globulin Ratio: 1.4 (ref 1.2–2.2)
Albumin: 4.3 g/dL (ref 3.5–5.5)
Alkaline Phosphatase: 78 IU/L (ref 39–117)
BUN/Creatinine Ratio: 14 (ref 9–23)
BUN: 12 mg/dL (ref 6–24)
Bilirubin Total: 0.3 mg/dL (ref 0.0–1.2)
CO2: 26 mmol/L (ref 20–29)
Calcium: 9.4 mg/dL (ref 8.7–10.2)
Chloride: 100 mmol/L (ref 96–106)
Creatinine, Ser: 0.86 mg/dL (ref 0.57–1.00)
GFR calc Af Amer: 88 mL/min/{1.73_m2} (ref >59)
GFR calc non Af Amer: 76 mL/min/{1.73_m2} (ref >59)
Globulin, Total: 3 g/dL (ref 1.5–4.5)
Glucose: 94 mg/dL (ref 65–99)
Potassium: 3.8 mmol/L (ref 3.5–5.2)
Sodium: 141 mmol/L (ref 134–144)
Total Protein: 7.3 g/dL (ref 6.0–8.5)

## 2017-02-13 LAB — CBC WITH DIFFERENTIAL/PLATELET
Basophils Absolute: 0.1 10*3/uL (ref 0.0–0.2)
Basos: 1 %
EOS (ABSOLUTE): 0.1 10*3/uL (ref 0.0–0.4)
Eos: 3 %
Hematocrit: 43 % (ref 34.0–46.6)
Hemoglobin: 14.1 g/dL (ref 11.1–15.9)
Immature Grans (Abs): 0 10*3/uL (ref 0.0–0.1)
Immature Granulocytes: 0 %
Lymphocytes Absolute: 2.1 10*3/uL (ref 0.7–3.1)
Lymphs: 37 %
MCH: 28.4 pg (ref 26.6–33.0)
MCHC: 32.8 g/dL (ref 31.5–35.7)
MCV: 87 fL (ref 79–97)
Monocytes Absolute: 0.5 10*3/uL (ref 0.1–0.9)
Monocytes: 9 %
Neutrophils Absolute: 2.9 10*3/uL (ref 1.4–7.0)
Neutrophils: 50 %
Platelets: 253 10*3/uL (ref 150–379)
RBC: 4.97 x10E6/uL (ref 3.77–5.28)
RDW: 13.7 % (ref 12.3–15.4)
WBC: 5.7 10*3/uL (ref 3.4–10.8)

## 2017-02-13 LAB — TSH: TSH: 1.98 u[IU]/mL (ref 0.450–4.500)

## 2017-02-13 LAB — T4, FREE: Free T4: 0.97 ng/dL (ref 0.82–1.77)

## 2017-02-13 LAB — RHEUMATOID FACTOR: Rhuematoid fact SerPl-aCnc: <10 IU/mL (ref 0.0–13.9)

## 2017-02-13 LAB — ANA: ANA Titer 1: NEGATIVE

## 2017-02-13 LAB — SEDIMENTATION RATE: Sed Rate: 8 mm/hr (ref 0–40)

## 2017-03-14 ENCOUNTER — Other Ambulatory Visit: Payer: Self-pay | Admitting: Physician Assistant

## 2017-03-14 DIAGNOSIS — I10 Essential (primary) hypertension: Secondary | ICD-10-CM

## 2017-03-21 ENCOUNTER — Other Ambulatory Visit: Payer: Self-pay | Admitting: Physician Assistant

## 2017-05-31 ENCOUNTER — Ambulatory Visit (INDEPENDENT_AMBULATORY_CARE_PROVIDER_SITE_OTHER): Payer: 59 | Admitting: Gastroenterology

## 2017-05-31 ENCOUNTER — Other Ambulatory Visit (INDEPENDENT_AMBULATORY_CARE_PROVIDER_SITE_OTHER): Payer: 59

## 2017-05-31 ENCOUNTER — Encounter: Payer: Self-pay | Admitting: Gastroenterology

## 2017-05-31 VITALS — BP 96/68 | HR 72 | Ht 59.0 in | Wt 177.2 lb

## 2017-05-31 DIAGNOSIS — K51319 Ulcerative (chronic) rectosigmoiditis with unspecified complications: Secondary | ICD-10-CM | POA: Diagnosis not present

## 2017-05-31 DIAGNOSIS — K51219 Ulcerative (chronic) proctitis with unspecified complications: Secondary | ICD-10-CM | POA: Diagnosis not present

## 2017-05-31 LAB — CBC WITH DIFFERENTIAL/PLATELET
Basophils Absolute: 0.1 10*3/uL (ref 0.0–0.1)
Basophils Relative: 0.8 % (ref 0.0–3.0)
Eosinophils Absolute: 0.3 10*3/uL (ref 0.0–0.7)
Eosinophils Relative: 3.5 % (ref 0.0–5.0)
HCT: 42 % (ref 36.0–46.0)
Hemoglobin: 14 g/dL (ref 12.0–15.0)
Lymphocytes Relative: 31.4 % (ref 12.0–46.0)
Lymphs Abs: 2.6 10*3/uL (ref 0.7–4.0)
MCHC: 33.2 g/dL (ref 30.0–36.0)
MCV: 85.9 fl (ref 78.0–100.0)
Monocytes Absolute: 0.6 10*3/uL (ref 0.1–1.0)
Monocytes Relative: 7.3 % (ref 3.0–12.0)
Neutro Abs: 4.7 10*3/uL (ref 1.4–7.7)
Neutrophils Relative %: 57 % (ref 43.0–77.0)
Platelets: 281 10*3/uL (ref 150.0–400.0)
RBC: 4.89 Mil/uL (ref 3.87–5.11)
RDW: 15 % (ref 11.5–15.5)
WBC: 8.2 10*3/uL (ref 4.0–10.5)

## 2017-05-31 LAB — C-REACTIVE PROTEIN: CRP: 0.4 mg/dL — ABNORMAL LOW (ref 0.5–20.0)

## 2017-05-31 LAB — COMPREHENSIVE METABOLIC PANEL
ALT: 9 U/L (ref 0–35)
AST: 12 U/L (ref 0–37)
Albumin: 3.9 g/dL (ref 3.5–5.2)
Alkaline Phosphatase: 59 U/L (ref 39–117)
BUN: 13 mg/dL (ref 6–23)
CO2: 31 mEq/L (ref 19–32)
Calcium: 9.5 mg/dL (ref 8.4–10.5)
Chloride: 104 mEq/L (ref 96–112)
Creatinine, Ser: 0.82 mg/dL (ref 0.40–1.20)
GFR: 76.58 mL/min (ref >60.00)
Glucose, Bld: 125 mg/dL — ABNORMAL HIGH (ref 70–99)
Potassium: 3.5 mEq/L (ref 3.5–5.1)
Sodium: 141 mEq/L (ref 135–145)
Total Bilirubin: 0.3 mg/dL (ref 0.2–1.2)
Total Protein: 7.2 g/dL (ref 6.0–8.3)

## 2017-05-31 NOTE — Progress Notes (Signed)
HPI :  Colitis History Left sided ulcerative colitis, diagnosed around 15 years ago from what she can recall. She has been on Remicade in the past for her colitis, which she thinks she did well for a few years. She had a severe MRSA infection of her ear and ultimately Remicade was held and eventually was stopped given she had done well when it was held. She has been on Imuran in the past, she thinks she tolerated it well, she thinks it was weaned off given she had been feeling well also. She has never been on Humira or other anti-TNF agents. She has never been on methotrexate. She has been on Lialda for a long time, prior to that Asacol. She has Von Willebrands. She bleeds easily but has never had significant bleeding / hemorrhages in the past. She has had DDAVP infusions prior to her last colonoscopy or Stilmate nose spray. Started on [facility] April 2018 following a few flares that Spring.   Colonoscopy 2014 - active rectal inflammation, inflammatory polyp Colonoscopy 09/21/2015 - overall good control of colitis with small mild area of inflammation in left colon, mild chronic colitis  SINCE LAST VISIT:  Since last visit the patient had a flare of her colitis. Tested negative for C diff. Started on Uceris 9mg  daily and still had some ongoing symptoms.  She then was transitioned to steroid taper which helped and ultimately got approved for [facility] which she started in April. After she started this she endorsed good control of symptoms for several months. Unfortunately over the past 4 weeks she's had a flare of her symptoms again. Her last dose of Entyvio was 2 weeks ago. She endorses multiple low-volume bowel movements per day with urgency and tenesmus, with some blood in the stool and mucus. No abdominal pains. She feels these are her typical symptoms of flaring. No fevers. She is compliant with Lialda but has not used Rowasa.    Past Medical History:  Diagnosis Date  . Anemia   . Cellulitis of  left breast 09/17/2015   diagnosed at Urgent Care- on ABX  . Clotting disorder ([place])   . Depression    takes Prozac daily  . Hearing loss    right ear  . History of colon polyps    benign  . Hypertension    takes Metoprolol daily  . Hypokalemia    takes Potassium every other day  . Inflammatory polyps of colon ([place])   . Internal hemorrhoids   . Osteopenia   . Personal history of meningioma of the brain   . Pneumonia    hx of-about 4+ yrs ago  . Seasonal allergies    takes Allegra as needed and Flonase daily  . SVT (supraventricular tachycardia) ([place])   . Tennis elbow   . Ulcerative colitis    takes Lialda daily and Rowasa at bedtime  . Urinary urgency   . Vitamin B12 deficiency   . Von Willebrands disease [facility])      Past Surgical History:  Procedure Laterality Date  . BAHA REVISION Right 03/15/2016   Procedure: REVISION RIGHT BAHA HEARING IMPLANT ;  Surgeon: Vicie Mutters, MD;  Location: [place];  Service: ENT;  Laterality: Right;  . BRAIN MENINGIOMA EXCISION  05/2007  . CARDIAC ELECTROPHYSIOLOGY STUDY AND ABLATION  10/22/09  . COLONOSCOPY N/A 07/29/2013   Procedure: COLONOSCOPY;  Surgeon: Lafayette Dragon, MD;  [facility];  Service: Endoscopy;  Laterality: N/A;  . COLONOSCOPY WITH PROPOFOL N/A 09/21/2015  Procedure: COLONOSCOPY WITH PROPOFOL;  Surgeon: Manus Gunning, MD;  Location: [facility];  Service: Gastroenterology;  Laterality: N/A;  . ESOPHAGOGASTRODUODENOSCOPY    . IMPLANTATION BONE ANCHORED HEARING AID Right   . WISDOM TOOTH EXTRACTION     Family History  Problem Relation Age of Onset  . Heart disease Maternal Grandfather   . Heart disease Mother   . Stroke Mother   . Diabetes Maternal Uncle   . Colon cancer Neg Hx    Social History  Substance Use Topics  . Smoking status: Never Smoker  . Smokeless tobacco: Never Used  . Alcohol use No   Current Outpatient Prescriptions  Medication Sig Dispense Refill  . Calcium Carbonate-Vit D-Min  (CALCIUM 1200) 1200-1000 MG-UNIT CHEW Chew 1 tablet by mouth daily.    Marland Kitchen FLUoxetine (PROZAC) 20 MG capsule TAKE ONE CAPSULE BY MOUTH DAILY 90 capsule 3  . mesalamine (LIALDA) 1.2 g EC tablet Take 1 tablet (1.2 g total) by mouth 4 (four) times daily. (Patient taking differently: Take 4.8 g by mouth daily. ) 360 tablet 3  . metoprolol succinate (TOPROL-XL) 50 MG 24 hr tablet TAKE 1 TABLET(50 MG) BY MOUTH DAILY 90 tablet 0  . vedolizumab (ENTYVIO) 300 MG injection Inject 300 mg into the vein as directed. Patient will need induction scheduled at week 0, 2,6 then every 8 weeks. 1 vial 6  . dicyclomine (BENTYL) 10 MG capsule Take 1 capsule (10 mg total) by mouth 4 (four) times daily as needed for spasms. (Patient not taking: Reported on 05/31/2017) 30 capsule 0   No current facility-administered medications for this visit.    Allergies  Allergen Reactions  . Atenolol Swelling  . Cephalexin Other (See Comments)    Upset her colitis     Review of Systems: All systems reviewed and negative except where noted in HPI.    Lab Results  Component Value Date   WBC 8.2 05/31/2017   HGB 14.0 05/31/2017   HCT 42.0 05/31/2017   MCV 85.9 05/31/2017   PLT 281.0 05/31/2017     Lab Results  Component Value Date   CREATININE 0.82 05/31/2017   BUN 13 05/31/2017   NA 141 05/31/2017   K 3.5 05/31/2017   CL 104 05/31/2017   CO2 31 05/31/2017    Lab Results  Component Value Date   ALT 9 05/31/2017   AST 12 05/31/2017   ALKPHOS 59 05/31/2017   BILITOT 0.3 05/31/2017    Lab Results  Component Value Date   CRP 0.4 (L) 05/31/2017     Physical Exam: BP 96/68   Pulse 72   Ht 4\' 11"  (1.499 m)   Wt 177 lb 4 oz (80.4 kg)   LMP 11/26/2008   BMI 35.80 kg/m  Constitutional: Pleasant,well-developed, female in no acute distress. HEENT: Normocephalic and atraumatic. Conjunctivae are normal. No scleral icterus. Neck supple.  Cardiovascular: Normal rate, regular rhythm.  Pulmonary/chest: Effort  normal and breath sounds normal. No wheezing, rales or rhonchi. Abdominal: Soft, nondistended, nontender. . No hepatomegaly. Extremities: no edema Lymphadenopathy: No cervical adenopathy noted. Neurological: Alert and oriented to person place and time. Skin: Skin is warm and dry. No rashes noted. Psychiatric: Normal mood and affect. Behavior is normal.   ASSESSMENT AND PLAN: 56 year old female here for follow-up visit for left-sided colitis:  Left sided UC - longstanding disease < 10 years. Prior severe MRSA infection on Remicade, eventually transitioned from mesalamine monotherapy most recently to [facility] this past April. Generally has felt much better  on this regimen but having symptoms of a flare in recent weeks. Given her increased risk for C diff on this regimen, will send stool study for this. I otherwise obtained CBC and CRP, CMET which are normal. (previously she has had elevation with CRP during flare). Unclear if she has C diff, true flare of UC, versus IBS. If C diff is negative, may proceed with flex sig to clarify how active her disease is and assess response to Entyvio more objectively prior to changing management. If C diff is positive, we will treat that first. In the interim, she can use Rowasa enemas which she has at home and see if that helps. If she is having a true flare on Entyvio, may consider Uceris foam to induce remission and consider escalation of Entyvio to once monthly dosing.   [place] Cellar, MD [facility] Pager [phone]

## 2017-05-31 NOTE — Patient Instructions (Signed)
If you are age 56 or older, your body mass index should be between 23-30. Your Body mass index is 35.8 kg/m. If this is out of the aforementioned range listed, please consider follow up with your Primary Care Provider.  If you are age 44 or younger, your body mass index should be between 19-25. Your Body mass index is 35.8 kg/m. If this is out of the aformentioned range listed, please consider follow up with your Primary Care Provider.   Your physician has requested that you go to the basement for the following lab work before leaving today: CBC w/Diff CMET CRP C Diff PCR  Use Rowasa enema at home.  If C DIFF is positive, will treat.  If C Diff is negative, will use Uceris Foam.  Will increase Entyvio to once monthly if C Diff is negative.  Thank you for choosing [facility]  [place] Cellar, MD

## 2017-06-01 ENCOUNTER — Other Ambulatory Visit: Payer: 59

## 2017-06-01 DIAGNOSIS — K51319 Ulcerative (chronic) rectosigmoiditis with unspecified complications: Secondary | ICD-10-CM

## 2017-06-04 ENCOUNTER — Other Ambulatory Visit: Payer: Self-pay

## 2017-06-04 LAB — CLOSTRIDIUM DIFFICILE BY PCR: Toxigenic C. Difficile by PCR: DETECTED — AB

## 2017-06-04 MED ORDER — VANCOMYCIN HCL 125 MG PO CAPS: 125.0000 mg | ORAL_CAPSULE | Freq: Four times a day (QID) | ORAL | 0 refills | Status: AC

## 2017-06-06 ENCOUNTER — Other Ambulatory Visit: Payer: Self-pay | Admitting: Physician Assistant

## 2017-06-06 DIAGNOSIS — I10 Essential (primary) hypertension: Secondary | ICD-10-CM

## 2017-06-12 ENCOUNTER — Encounter: Payer: Self-pay | Admitting: Gastroenterology

## 2017-07-23 ENCOUNTER — Encounter: Payer: Self-pay | Admitting: Gastroenterology

## 2017-07-25 ENCOUNTER — Other Ambulatory Visit: Payer: Self-pay

## 2017-07-25 ENCOUNTER — Telehealth: Payer: Self-pay

## 2017-07-25 DIAGNOSIS — K51219 Ulcerative (chronic) proctitis with unspecified complications: Secondary | ICD-10-CM

## 2017-07-25 MED ORDER — NA SULFATE-K SULFATE-MG SULF 17.5-3.13-1.6 GM/177ML PO SOLN: 1.0000 | ORAL | 0 refills | Status: DC

## 2017-07-25 NOTE — Telephone Encounter (Signed)
Sent message to patient regarding colonoscopy. Prep instructions mailed today, Rx sent to pharmacy.

## 2017-07-25 NOTE — Telephone Encounter (Signed)
-----   Message from Yetta Flock, MD sent at 07/25/2017 12:49 PM EST ----- Yes she is due for a full surveillance colonoscopy. I would book in January at next available hospital day. She uses Stimate nasal spray per Hematology for bleeding disorder. She should touch base with them regarding when she needs to take this - assuming sometime before and after the procedure if polyps are removed.   Thanks Almyra Free  ----- Message ----- From: Doristine Counter, RN Sent: 07/25/2017  10:32 AM To: Yetta Flock, MD  Looking back at last ov note, you were testing for C diff and if it was negative would proceed with flex sig. It was + and was treated. Do you still want only flex sig, and if so is LEC okay? Looking back at last colonoscopy, it was at hospital and suggested colonoscopy in 1-2 years. January 2019 is 2 years, no recall in system. Let me know where and what to schedule, thank you! Almyra Free

## 2017-07-31 ENCOUNTER — Telehealth: Payer: Self-pay | Admitting: *Deleted

## 2017-07-31 NOTE — Telephone Encounter (Signed)
Voicemail from pt who was seen last in 2014 by Dr. Juliann Mule. She reports she has Von Willebrands and is scheduled for colonoscopy. She is requesting stimate dosing and office visit here if needed.  Will review with on-call provider.

## 2017-07-31 NOTE — Telephone Encounter (Signed)
Per Dr. Benay Spice (on-call) pt should be seen in office prior to recommending DDAVP treatment for procedure. Message to new pt scheduler to contact pt with appt. (Any MD with openings) Informed pt that our office will contact her with appt.

## 2017-08-13 ENCOUNTER — Ambulatory Visit (HOSPITAL_BASED_OUTPATIENT_CLINIC_OR_DEPARTMENT_OTHER): Payer: 59

## 2017-08-13 ENCOUNTER — Ambulatory Visit (HOSPITAL_BASED_OUTPATIENT_CLINIC_OR_DEPARTMENT_OTHER): Payer: 59 | Admitting: Oncology

## 2017-08-13 VITALS — BP 137/81 | HR 58 | Temp 98.1°F | Resp 18 | Ht 59.0 in | Wt 176.8 lb

## 2017-08-13 DIAGNOSIS — R791 Abnormal coagulation profile: Secondary | ICD-10-CM | POA: Diagnosis not present

## 2017-08-13 DIAGNOSIS — I10 Essential (primary) hypertension: Secondary | ICD-10-CM

## 2017-08-13 DIAGNOSIS — Z86010 Personal history of colonic polyps: Secondary | ICD-10-CM | POA: Diagnosis not present

## 2017-08-13 DIAGNOSIS — A0472 Enterocolitis due to Clostridium difficile, not specified as recurrent: Secondary | ICD-10-CM | POA: Diagnosis not present

## 2017-08-13 DIAGNOSIS — D6800 Von Willebrand disease, unspecified: Secondary | ICD-10-CM

## 2017-08-13 NOTE — Progress Notes (Addendum)
[facility] New Patient Consult   Referring MD: Lauren [name], [name], [name] 56 y.o.  Dec 20, 1960    Reason for Referral: Von Willebrand's disease   HPI: Lauren [name] has a history of inflammatory bowel disease and colon polyps.  She is scheduled for a colonoscopy 09/17/2016.  She reports a history of von Willebrand's disease diagnosed diagnosed by Dr.Odogwu in 2008.  She was noted to have a repeat prolongation of the bleeding time, 11.5 minutes on 06/07/2007.  The factor VIII activity returned mildly low at 69% on 06/07/2007 with a normal von Willebrand antigen at 118% and a normal von Willebrand multimer pattern.  The PT and PTT were normal.  Platelet aggregation study was normal. A DDAVP stimulation test on 06/20/2017 found the factor VIII level at 76% baseline with a rise to 171% after DDAVP.  The von Willebrand antigen returned at 106% and increased to 179% after DDAVP.  This evaluation occurred prior to resection of a cranial meningioma.  She underwent resection of the meningioma with DDAVP support.  The operative note confirmed the patient was "woozy "requiring electric cardia and thrombin-soaked Gelfoam for hemostasis.  Humate was given during surgery.  Estimated blood loss was 300 cc.  She reports undergoing multiple surgical procedures since being diagnosed with von Willebrand's disease.  She has received DDAVP support without excessive bleeding, though she reports delayed bleeding following implantation of a hearing aid device.  Procedures over recent years have included a cardiac ablation, multiple polypectomies, and 2 hearing device implantations.  She did not have menorrhagia at a younger age.  She has been amenorrheic for the past 7-8 years.  She did not have excessive bleeding with wisdom tooth extraction or childbirth.  No spontaneous bleeding or bruising.  No mouth bleeding with toothbrushing.  There is no family  history of a bleeding disorder.  She was diagnosed with C. difficile colitis in October.  She relates weight loss to the prolonged illness with the C. difficile.  Past Medical History:  Diagnosis Date  . Anemia   . Cellulitis of left breast 09/17/2015   diagnosed at Urgent Care- on ABX  . Clotting disorder ([facility])-von Willebrand's disease  2008  . Depression    takes Prozac daily  . Hearing loss  [place] [address]   right ear following resection of a meningioma  . History of colon polyps    benign  . Hypertension    takes Metoprolol daily  . Hypokalemia    takes Potassium every other day  . Inflammatory polyps of colon ([place])   . Internal hemorrhoids   . Osteopenia   . Personal history of meningioma of the brain   . Pneumonia    hx of-about 4+ yrs ago  . Seasonal allergies    takes Allegra as needed and Flonase daily  . SVT (supraventricular tachycardia) ([place])   . Tennis elbow   . Ulcerative colitis    takes Lialda daily and Rowasa at bedtime  . Urinary urgency   . Vitamin B12 deficiency   . Von Willebrands disease ([place])     .  G1P1   .  C. difficile colitis October 2018  Past Surgical History:  Procedure Laterality Date  . BAHA REVISION Right 03/15/2016   Procedure: REVISION RIGHT BAHA HEARING IMPLANT ;  Surgeon: Vicie Mutters, MD;  Location: [facility];  Service: ENT;  Laterality: Right;  . BRAIN MENINGIOMA EXCISION  05/2007  . CARDIAC ELECTROPHYSIOLOGY  STUDY AND ABLATION  10/22/09  . COLONOSCOPY N/A 07/29/2013   Procedure: COLONOSCOPY;  Surgeon: Lafayette Dragon, MD;  [facility];  Service: Endoscopy;  Laterality: N/A;  . COLONOSCOPY WITH PROPOFOL N/A 09/21/2015   Procedure: COLONOSCOPY WITH PROPOFOL;  Surgeon: Manus Gunning, MD;  [facility];  Service: Gastroenterology;  Laterality: N/A;  . ESOPHAGOGASTRODUODENOSCOPY    . IMPLANTATION BONE ANCHORED HEARING AID Right   . WISDOM TOOTH EXTRACTION      Medications: Reviewed  Allergies:  Allergies    Allergen Reactions  . Atenolol Swelling  . Cephalexin Other (See Comments)    Upset her colitis    Family history: No family history of a bleeding disorder  Social History:   She lives with her husband in [place].  She works as a Radiation protection practitioner for [employer].  She does not use cigarettes or alcohol.  No transfusion history.  No risk factor for HIV or hepatitis.  She received humate during meningioma surgery in 2008  ROS:   Positives include: Diarrhea secondary to C. difficile colitis October 2018, history of hematuria-evaluated by urology, right hearing loss following meningioma surgery in 2008  A complete ROS was otherwise negative.  Physical Exam:  Blood pressure 137/81, pulse (!) 58, temperature 98.1 F (36.7 C), temperature source Oral, resp. rate 18, height 4' 11"  (1.499 m), weight 176 lb 12.8 oz (80.2 kg), last menstrual period 11/26/2008, SpO2 96 %.  HEENT: Oral cavity without bleeding Lungs: Clear bilaterally Cardiac: Regular rate and rhythm Abdomen: No hepatosplenomegaly, nontender  Vascular: No leg edema Neurologic: Alert and oriented, the motor exam appears intact in the upper and lower extremities Skin: No ecchymoses Musculoskeletal: No spine tenderness   LAB:  CBC  Lab Results  Component Value Date   WBC 8.2 05/31/2017   HGB 14.0 05/31/2017   HCT 42.0 05/31/2017   MCV 85.9 05/31/2017   PLT 281.0 05/31/2017   NEUTROABS 4.7 05/31/2017        CMP     Component Value Date/Time   NA 141 05/31/2017 1613   NA 141 02/09/2017 1531   K 3.5 05/31/2017 1613   CL 104 05/31/2017 1613   CO2 31 05/31/2017 1613   GLUCOSE 125 (H) 05/31/2017 1613   BUN 13 05/31/2017 1613   BUN 12 02/09/2017 1531   CREATININE 0.82 05/31/2017 1613   CREATININE 0.62 07/05/2015 1042   CALCIUM 9.5 05/31/2017 1613   PROT 7.2 05/31/2017 1613   PROT 7.3 02/09/2017 1531   ALBUMIN 3.9 05/31/2017 1613   ALBUMIN 4.3 02/09/2017 1531   AST 12 05/31/2017 1613   ALT 9 05/31/2017 1613    ALKPHOS 59 05/31/2017 1613   BILITOT 0.3 05/31/2017 1613   BILITOT 0.3 02/09/2017 1531   GFRNONAA 76 02/09/2017 1531   GFRAA 88 02/09/2017 1531       Assessment/Plan:   1. History of a prolonged bleeding time, diagnosed with von Willebrand's disease in 2008  Mildly decreased factor VIII level on one occasion in 2008  Increased factor VIII and von Willebrand antigen levels with a DDAVP stimulation test in October 2008  Prolonged bleeding time 2008  Blood type A+  2.   Resection of a right cerebellopontine angle meningioma October 2008  3.   Ulcerative colitis-maintained on every 8-week Vedolizumab  4.   Right hearing loss following the meningioma resection October 2008  5.   History of colon polyps  6.   Hypertension   Disposition:   Lauren [name] is referred  for hematologic evaluation prior to a planned colonoscopy.  She carries a diagnosis of von Willebrand's disease based on a prolonged bleeding time and mildly decreased factor VIII level documented in 2008.  She is undergone multiple surgical procedures over the past several years with DDAVP support.  There is been no excessive bleeding other than increased "oozing" noted at the time of a meningioma resection in 2008 where she received DDAVP, humate, and topical procoagulants.  I think it will be safe for her undergo a colonoscopy with DDAVP to be given prior to the procedure and 12-24 hours later if polyps are removed.  However I do not think she clearly has von Willebrand's disease.  Diagnosis appears to be based on a single mildly decreased factor VIII level and a mildly prolonged bleeding time.  She does not have a history to suggest von Willebrand's disease at a younger age (lack of menorrhagia, spontaneous bruising, and lack of bleeding with wisdom tooth extraction).  We obtained a factor VIII level, von Willebrand antigen, and ristocetin cofactor activity today.  We also checked an APTT.  If these studies return normal I  will consider referring her to [facility] for additional testing.  Bleeding times are not routinely performed at present.  We will contact her when the laboratory studies from today are available and initiate additional diagnostic evaluation as indicated.  50 minutes were spent with the patient today.  The majority of the time was used for counseling and coordination of care.  Betsy Coder, MD  08/13/2017, 6:01 PM

## 2017-08-14 LAB — VON WILLEBRAND PANEL
Factor VIII Activity: 143 % (ref 57–163)
vWF Activity: 78 % (ref 50–200)
von Willebrand Factor (vWF) Ag: 130 % (ref 50–200)

## 2017-08-14 LAB — APTT: aPTT: 26 s (ref 24–33)

## 2017-08-15 ENCOUNTER — Telehealth: Payer: Self-pay | Admitting: Oncology

## 2017-08-15 NOTE — Telephone Encounter (Signed)
Per 12/17 los - F/u TBA

## 2017-08-17 ENCOUNTER — Telehealth: Payer: Self-pay | Admitting: *Deleted

## 2017-08-17 DIAGNOSIS — D6800 Von Willebrand disease, unspecified: Secondary | ICD-10-CM

## 2017-08-17 NOTE — Telephone Encounter (Signed)
-----   Message from Ladell Pier, MD sent at 08/16/2017  6:14 PM EST ----- Please call patient, the von Willebrand testing is normal.  It is not clear that she has von Willebrand's disease. The von Willebrand activity is in the low normal range.  Levels can fluctuate in patients with mild von Willebrand's disease.  Repeat the von Willebrand antigen and and activity levels during the week of 09/03/2017.  We will see her again as needed.

## 2017-08-17 NOTE — Telephone Encounter (Signed)
Called pt with lab result, per MD note below. She voiced understanding and agreement with plan for repeat labs. Pt wants to be sure Dr. Havery Moros gets result and has enough time to plan for procedure.

## 2017-08-18 ENCOUNTER — Telehealth: Payer: Self-pay | Admitting: Oncology

## 2017-08-18 NOTE — Telephone Encounter (Signed)
Spoke with patient regarding lab appt .  Per sched msg 12/21

## 2017-09-03 ENCOUNTER — Other Ambulatory Visit: Payer: Self-pay | Admitting: Physician Assistant

## 2017-09-03 ENCOUNTER — Inpatient Hospital Stay: Payer: 59 | Attending: Oncology

## 2017-09-03 DIAGNOSIS — I10 Essential (primary) hypertension: Secondary | ICD-10-CM

## 2017-09-03 DIAGNOSIS — D6800 Von Willebrand disease, unspecified: Secondary | ICD-10-CM

## 2017-09-04 ENCOUNTER — Other Ambulatory Visit: Payer: Self-pay | Admitting: Gastroenterology

## 2017-09-04 ENCOUNTER — Other Ambulatory Visit: Payer: Self-pay | Admitting: Physician Assistant

## 2017-09-04 DIAGNOSIS — K5150 Left sided colitis without complications: Secondary | ICD-10-CM

## 2017-09-04 NOTE — Telephone Encounter (Signed)
Last OV 09-26-16

## 2017-09-06 ENCOUNTER — Telehealth: Payer: Self-pay | Admitting: Gastroenterology

## 2017-09-06 ENCOUNTER — Other Ambulatory Visit: Payer: Self-pay

## 2017-09-06 LAB — COAG STUDIES INTERP REPORT

## 2017-09-06 LAB — VON WILLEBRAND PANEL
Coagulation Factor VIII: 111 % (ref 57–163)
Ristocetin Co-factor, Plasma: 123 % (ref 50–200)
Von Willebrand Antigen, Plasma: 127 % (ref 50–200)

## 2017-09-06 NOTE — Telephone Encounter (Signed)
[name]-Patient is rescheduled for 10/30/17 and patient that was on 10/30/17 has been moved up to 09/17/17. Patient's aware and will get new set of prep instructions to them.

## 2017-09-06 NOTE — Telephone Encounter (Signed)
Just to make you aware that patient is requesting her procedure be moved to March. Are you okay with this plan?

## 2017-09-06 NOTE — Telephone Encounter (Signed)
Yes that is fine can move to March.  [name], FYI a spot opened up in January. Can you see if one of those March cases would want to switch and get it done sooner? I would imagine they would, as these spots are hard to come by. Thanks

## 2017-09-07 ENCOUNTER — Telehealth: Payer: Self-pay

## 2017-09-07 NOTE — Telephone Encounter (Signed)
-----   Message from Ladell Pier, MD sent at 09/06/2017  6:15 PM EST ----- Please call patient, testing for VWD is again normal, she does not appear to have Von Willebrands disease Can refer her to [facility] for consultation and further testing if she will agree

## 2017-09-07 NOTE — Telephone Encounter (Signed)
Left message for patient to return call.

## 2017-09-10 NOTE — Telephone Encounter (Signed)
Unable to reach patient.

## 2017-09-10 NOTE — Telephone Encounter (Signed)
Spoke with patient regarding message below. Patient voiced understanding and agrees to the [facility] referral.

## 2017-09-10 NOTE — Telephone Encounter (Signed)
Spoke with Junie Panning at [facility] to make referral for patient. Per Junie Panning, this RN will fax referral to their office.

## 2017-09-18 ENCOUNTER — Telehealth: Payer: Self-pay | Admitting: *Deleted

## 2017-09-18 NOTE — Telephone Encounter (Signed)
Pt called wanting to know status of referral to [facility] for further testing of Von Willebrand disease.  Stated she has colonoscopy scheduled for March 5.   Pt would like to know the answer of what she has before colonoscopy procedure.  Pt is very apprehensive about her situation. Pt's    Phone      [phone].

## 2017-09-19 ENCOUNTER — Other Ambulatory Visit: Payer: Self-pay | Admitting: *Deleted

## 2017-09-19 NOTE — Telephone Encounter (Signed)
Called [facility]. They did not receive any faxes from our office. Faxed demographics notes and labs to [phone]. Called pt with above information. Provided contact info for [facility]. Pt voiced appreciation for return call.

## 2017-10-03 ENCOUNTER — Other Ambulatory Visit: Payer: Self-pay | Admitting: Physician Assistant

## 2017-10-03 DIAGNOSIS — I10 Essential (primary) hypertension: Secondary | ICD-10-CM

## 2017-10-24 ENCOUNTER — Encounter (HOSPITAL_COMMUNITY): Payer: Self-pay | Admitting: Emergency Medicine

## 2017-10-24 ENCOUNTER — Other Ambulatory Visit: Payer: Self-pay

## 2017-10-26 ENCOUNTER — Other Ambulatory Visit: Payer: Self-pay

## 2017-10-26 ENCOUNTER — Other Ambulatory Visit: Payer: Self-pay | Admitting: Physician Assistant

## 2017-10-26 DIAGNOSIS — Z139 Encounter for screening, unspecified: Secondary | ICD-10-CM

## 2017-10-29 NOTE — Anesthesia Preprocedure Evaluation (Addendum)
Anesthesia Evaluation   Patient awake    Reviewed: Allergy & Precautions, NPO status , Patient's Chart, lab work & pertinent test results  History of Anesthesia Complications Negative for: history of anesthetic complications  Airway Mallampati: II  TM Distance: >3 FB Neck ROM: Full    Dental no notable dental hx. (+) Dental Advisory Given   Pulmonary neg pulmonary ROS,    Pulmonary exam normal breath sounds clear to auscultation       Cardiovascular hypertension, Pt. on medications and Pt. on home beta blockers Normal cardiovascular exam Rhythm:Regular Rate:Normal  S/P ablation   Neuro/Psych PSYCHIATRIC DISORDERS Anxiety Depression Menigioma history    GI/Hepatic Neg liver ROS, PUD, Ulcerative colitis   Endo/Other  negative endocrine ROS  Renal/GU negative Renal ROS  negative genitourinary   Musculoskeletal  (+) Arthritis ,   Abdominal   Peds negative pediatric ROS (+)  Hematology  (+) anemia , Von Willebrand's Disease   Anesthesia Other Findings   Reproductive/Obstetrics negative OB ROS                            Anesthesia Physical  Anesthesia Plan  ASA: III  Anesthesia Plan: MAC   Post-op Pain Management:    Induction: Intravenous  PONV Risk Score and Plan: 2 and Ondansetron and Propofol infusion  Airway Management Planned: Natural Airway and Simple Face Mask  Additional Equipment:   Intra-op Plan:   Post-operative Plan:   Informed Consent: I have reviewed the patients History and Physical, chart, labs and discussed the procedure including the risks, benefits and alternatives for the proposed anesthesia with the patient or authorized representative who has indicated his/her understanding and acceptance.   Dental advisory given  Plan Discussed with: CRNA and Anesthesiologist  Anesthesia Plan Comments:        Anesthesia Quick Evaluation

## 2017-10-30 ENCOUNTER — Ambulatory Visit (HOSPITAL_COMMUNITY): Payer: 59 | Admitting: Anesthesiology

## 2017-10-30 ENCOUNTER — Ambulatory Visit (HOSPITAL_COMMUNITY)
Admission: RE | Admit: 2017-10-30 | Discharge: 2017-10-30 | Disposition: A | Discharge status: Home / Self Care | Payer: 59 | Source: Ambulatory Visit | Attending: Gastroenterology | Admitting: Gastroenterology

## 2017-10-30 ENCOUNTER — Other Ambulatory Visit: Payer: Self-pay

## 2017-10-30 ENCOUNTER — Encounter (HOSPITAL_COMMUNITY): Payer: Self-pay | Admitting: Emergency Medicine

## 2017-10-30 ENCOUNTER — Encounter (HOSPITAL_COMMUNITY)
Admission: RE | Disposition: A | Discharge status: Home / Self Care | Payer: Self-pay | Source: Ambulatory Visit | Attending: Gastroenterology

## 2017-10-30 ENCOUNTER — Other Ambulatory Visit: Payer: Self-pay | Admitting: Gastroenterology

## 2017-10-30 DIAGNOSIS — K51219 Ulcerative (chronic) proctitis with unspecified complications: Secondary | ICD-10-CM

## 2017-10-30 DIAGNOSIS — E876 Hypokalemia: Secondary | ICD-10-CM | POA: Diagnosis not present

## 2017-10-30 DIAGNOSIS — Z1211 Encounter for screening for malignant neoplasm of colon: Secondary | ICD-10-CM | POA: Diagnosis present

## 2017-10-30 DIAGNOSIS — Z79899 Other long term (current) drug therapy: Secondary | ICD-10-CM | POA: Insufficient documentation

## 2017-10-30 DIAGNOSIS — K51519 Left sided colitis with unspecified complications: Secondary | ICD-10-CM

## 2017-10-30 DIAGNOSIS — I471 Supraventricular tachycardia: Secondary | ICD-10-CM | POA: Insufficient documentation

## 2017-10-30 DIAGNOSIS — F329 Major depressive disorder, single episode, unspecified: Secondary | ICD-10-CM | POA: Insufficient documentation

## 2017-10-30 DIAGNOSIS — K5150 Left sided colitis without complications: Secondary | ICD-10-CM | POA: Diagnosis not present

## 2017-10-30 DIAGNOSIS — K529 Noninfective gastroenteritis and colitis, unspecified: Secondary | ICD-10-CM | POA: Diagnosis not present

## 2017-10-30 DIAGNOSIS — I10 Essential (primary) hypertension: Secondary | ICD-10-CM | POA: Insufficient documentation

## 2017-10-30 DIAGNOSIS — D680 Von Willebrand's disease: Secondary | ICD-10-CM | POA: Insufficient documentation

## 2017-10-30 DIAGNOSIS — M8580 Other specified disorders of bone density and structure, unspecified site: Secondary | ICD-10-CM | POA: Insufficient documentation

## 2017-10-30 HISTORY — PX: COLONOSCOPY WITH PROPOFOL: SHX5780

## 2017-10-30 SURGERY — COLONOSCOPY WITH PROPOFOL
Anesthesia: Monitor Anesthesia Care

## 2017-10-30 MED ORDER — PROPOFOL 500 MG/50ML IV EMUL
INTRAVENOUS | Status: DC | PRN
  Administered 2017-10-30: 08:00:00 via INTRAVENOUS
  Administered 2017-10-30: 300 ug/kg/min via INTRAVENOUS

## 2017-10-30 MED ORDER — ONDANSETRON HCL 4 MG/2ML IJ SOLN
INTRAMUSCULAR | Status: DC | PRN
  Administered 2017-10-30: 4 mg via INTRAVENOUS

## 2017-10-30 MED ORDER — LACTATED RINGERS IV SOLN
INTRAVENOUS | Status: DC
  Administered 2017-10-30: 08:00:00 via INTRAVENOUS

## 2017-10-30 MED ORDER — PROPOFOL 10 MG/ML IV BOLUS
INTRAVENOUS | Status: AC
  Filled 2017-10-30: qty 60

## 2017-10-30 MED ORDER — SODIUM CHLORIDE 0.9 % IV SOLN: INTRAVENOUS | Status: DC

## 2017-10-30 SURGICAL SUPPLY — 21 items

## 2017-10-30 NOTE — Op Note (Signed)
[facility] Patient Name: Lauren [name] Procedure Date: 10/30/2017 MRN: [id number] Attending MD: Carlota Raspberry. Armbruster MD, MD Date of Birth: [name] 20, 1961 CSN: [id number] Age: 57 Admit Type: Outpatient Procedure:                Colonoscopy Indications:              High risk colon cancer surveillance: Ulcerative                            left sided colitis - now on Entyvio, history of C                            diff - generally feeling quite well. Providers:                Carlota Raspberry. Armbruster MD, MD, Elmer Ramp. Tilden Dome, RN,                            Cherylynn Ridges, Technician, Enrigue Catena, CRNA Referring MD:              Medicines:                Monitored Anesthesia Care Complications:            No immediate complications. Estimated blood loss:                            Minimal. Estimated Blood Loss:     Estimated blood loss was minimal. Procedure:                Pre-Anesthesia Assessment:                           - Prior to the procedure, a History and Physical                            was performed, and patient medications and                            allergies were reviewed. The patient's tolerance of                            previous anesthesia was also reviewed. The risks                            and benefits of the procedure and the sedation                            options and risks were discussed with the patient.                            All questions were answered, and informed consent                            was obtained. Prior Anticoagulants: The patient has  taken no previous anticoagulant or antiplatelet                            agents. ASA Grade Assessment: III - A patient with                            severe systemic disease. After reviewing the risks                            and benefits, the patient was deemed in                            satisfactory condition to undergo the procedure.                            After obtaining informed consent, the colonoscope                            was passed under direct vision. Throughout the                            procedure, the patient's blood pressure, pulse, and                            oxygen saturations were monitored continuously. The                            EC-[id number] ([id number]) scope was introduced through                            the anus and advanced to the the terminal ileum,                            with identification of the appendiceal orifice and                            IC valve. The colonoscopy was performed without                            difficulty. The patient tolerated the procedure                            well. The quality of the bowel preparation was                            good. The terminal ileum, ileocecal valve,                            appendiceal orifice, and rectum were photographed. Scope In: 8:27:34 AM Scope Out: 8:44:14 AM Scope Withdrawal Time: 0 hours 13 minutes 50 seconds  Total Procedure Duration: 0 hours 16 minutes 40 seconds  Findings:      The perianal and digital rectal examinations were normal.      The terminal ileum appeared normal.  A roughly 10-15cm segment of mild patchy mild inflammation characterized       by granularity, loss of vascularity and aphthous ulcerations was found       in the sigmoid colon / descending colon. Biopsies were taken with a cold       forceps for histology.      The exam was otherwise without abnormality on direct and retroflexion       views.      Biopsies were taken with a cold forceps from the descending colon,       sigmoid colon, rectum and rectosigmoid colon for ulcerative colitis       surveillance. These biopsy specimens were sent to Pathology. Impression:               - The examined portion of the ileum was normal.                           - Patchy mild inflammation was found in a 10-15cm                            segment of the left  colon as outlined. Biopsied.                           - The examination was otherwise normal on direct                            and retroflexion views.                           - Biopsies for surveillance were taken from the                            descending colon, sigmoid colon, rectum and                            rectosigmoid colon. Moderate Sedation:      No moderate sedation, case performed with MAC Recommendation:           - Patient has a contact number available for                            emergencies. The signs and symptoms of potential                            delayed complications were discussed with the                            patient. Return to normal activities tomorrow.                            Written discharge instructions were provided to the                            patient.                           - Resume previous  diet.                           - Continue present medications.                           - Repeat administration of DDAVP / Stimate in 24-48                            hours per Hematology                           - Check Entyvio trough level at next Entyvio                            infusion                           - Await pathology results. Procedure Code(s):        --- Professional ---                           [phone], Colonoscopy, flexible; with biopsy, single                            or multiple Diagnosis Code(s):        --- Professional ---                           K51.50, Left sided colitis without complications                           K52.9, Noninfective gastroenteritis and colitis,                            unspecified CPT copyright 2016 American Medical Association. All rights reserved. The codes documented in this report are preliminary and upon coder review may  be revised to meet current compliance requirements. Remo Lipps P. Armbruster MD, MD 10/30/2017 8:52:53 AM This report has been signed  electronically. Number of Addenda: 0

## 2017-10-30 NOTE — Anesthesia Procedure Notes (Signed)
Procedure Name: MAC Date/Time: 10/30/2017 8:21 AM Performed by: Lissa Morales, CRNA Pre-anesthesia Checklist: Patient identified, Emergency Drugs available, Suction available, Patient being monitored and Timeout performed Patient Re-evaluated:Patient Re-evaluated prior to induction Oxygen Delivery Method: Simple face mask Placement Confirmation: positive ETCO2

## 2017-10-30 NOTE — Interval H&P Note (Signed)
History and Physical Interval Note:  10/30/2017 7:58 AM  Lauren [name]  has presented today for surgery, with the diagnosis of left-sided ulcerative colitis  The various methods of treatment have been discussed with the patient and family. After consideration of risks, benefits and other options for treatment, the patient has consented to  Procedure(s): COLONOSCOPY WITH PROPOFOL (N/A) as a surgical intervention .  The patient's history has been reviewed, patient examined, no change in status, stable for surgery.  I have reviewed the patient's chart and labs.  Questions were answered to the patient's satisfaction.     [place]

## 2017-10-30 NOTE — H&P (Signed)
HPI:   Lauren [name] is a 57 y.o. female with a history of ulcerative colitis on Entyvio, history of C diff, here for surveillance colonoscopy. History of Von willebrands disease followed by hematology. No complaints today.   Past Medical History:  Diagnosis Date  . Anemia   . Cellulitis of left breast 09/17/2015   diagnosed at Urgent Care- on ABX  . Clotting disorder ([place])   . Depression    takes Prozac daily  . Hearing loss    right ear  . History of colon polyps    benign  . Hypertension    takes Metoprolol daily  . Hypokalemia    takes Potassium every other day  . Inflammatory polyps of colon ([place])   . Internal hemorrhoids   . Osteopenia   . Personal history of meningioma of the brain   . Pneumonia    hx of-about 4+ yrs ago  . Seasonal allergies    takes Allegra as needed and Flonase daily  . SVT (supraventricular tachycardia) ([place])   . Tennis elbow   . Ulcerative colitis    takes Lialda daily and Rowasa at bedtime  . Urinary urgency   . Vitamin B12 deficiency   . Von Willebrands disease [facility])     Past Surgical History:  Procedure Laterality Date  . BAHA REVISION Right 03/15/2016   Procedure: REVISION RIGHT BAHA HEARING IMPLANT ;  Surgeon: Vicie Mutters, MD;  Location: [facility];  Service: ENT;  Laterality: Right;  . BRAIN MENINGIOMA EXCISION  05/2007  . CARDIAC ELECTROPHYSIOLOGY STUDY AND ABLATION  10/22/09  . COLONOSCOPY N/A 07/29/2013   Procedure: COLONOSCOPY;  Surgeon: Lafayette Dragon, MD;  Location: [facility];  Service: Endoscopy;  Laterality: N/A;  . COLONOSCOPY WITH PROPOFOL N/A 09/21/2015   Procedure: COLONOSCOPY WITH PROPOFOL;  Surgeon: Manus Gunning, MD;  Location: [facility];  Service: Gastroenterology;  Laterality: N/A;  . ESOPHAGOGASTRODUODENOSCOPY    . IMPLANTATION BONE ANCHORED HEARING AID Right   . WISDOM TOOTH EXTRACTION      Family History  Problem Relation Age of Onset  . Heart disease Maternal Grandfather   . Heart disease  Mother   . Stroke Mother   . Diabetes Maternal Uncle   . Colon cancer Neg Hx      Social History   Tobacco Use  . Smoking status: Never Smoker  . Smokeless tobacco: Never Used  Substance Use Topics  . Alcohol use: No    Alcohol/week: 0.0 oz  . Drug use: No    Prior to Admission medications   Medication Sig Start Date End Date Taking? Authorizing Provider  Calcium Carbonate-Vit D-Min (CALCIUM 1200) 1200-1000 MG-UNIT CHEW Chew 1 tablet by mouth daily.   Yes [provider]  Cholecalciferol (VITAMIN D3) 2000 units TABS Take 2,000 Units by mouth daily.   Yes [provider]  FLUoxetine (PROZAC) 20 MG capsule TAKE ONE CAPSULE BY MOUTH DAILY Patient taking differently: Take 20 mg by mouth daily 03/22/17  Yes Jeffery, Chelle, PA-C  hydrocortisone 2.5 % ointment Apply 1 application topically 2 (two) times daily as needed (for eczema).   Yes [provider]  mesalamine (LIALDA) 1.2 g EC tablet TAKE 1 TABLET(1.2 GRAMS) BY MOUTH FOUR TIMES DAILY Patient taking differently: Take 4.8 mg by mouth daily 09/04/17  Yes Armbruster, Carlota Raspberry, MD  metoprolol succinate (TOPROL-XL) 50 MG 24 hr tablet TAKE 1 TABLET BY MOUTH DAILY Patient taking differently: Take  50 mg by mouth daily 10/03/17  Yes Jeffery, Chelle, PA-C  Multiple Vitamin (MULTIVITAMIN) capsule Take 1 capsule by mouth daily.   Yes [provider]  saccharomyces boulardii (FLORASTOR) 250 MG capsule Take 250 mg by mouth daily.   Yes [provider]  STIMATE 1.5 MG/ML SOLN Place 1 spray into both nostrils See admin instructions. Use 1 spray in each nostril 30-60 minutes prior to procedure 10/02/17  Yes [provider]  tranexamic acid (LYSTEDA) 650 MG TABS tablet Take 1,300 mg by mouth See admin instructions. Start after procedure, take 1300 mg by mouth 3 times daily for 5 days 09/28/17  Yes [provider]  dicyclomine (BENTYL) 10 MG capsule Take 1 capsule (10 mg total) by mouth 4 (four)  times daily as needed for spasms. Patient not taking: Reported on 10/17/2017 08/25/16   Ladene Artist, MD  Na Sulfate-K Sulfate-Mg Sulf 17.5-3.13-1.6 GM/177ML SOLN Take 1 kit by mouth as directed. Patient not taking: Reported on 10/17/2017 07/25/17   Yetta Flock, MD  vedolizumab (ENTYVIO) 300 MG injection Inject 300 mg into the vein as directed. Patient will need induction scheduled at week 0, 2,6 then every 8 weeks. Patient taking differently: Inject 300 mg into the vein every 8 (eight) weeks.  10/24/16   Armbruster, Carlota Raspberry, MD    Current Facility-Administered Medications  Medication Dose Route Frequency Provider Last Rate Last Dose  . 0.9 %  sodium chloride infusion   Intravenous Continuous Armbruster, Carlota Raspberry, MD      . lactated ringers infusion   Intravenous Continuous Havery Moros Carlota Raspberry, MD 125 mL/hr at 10/30/17 0746      Allergies as of 07/25/2017 - Review Complete 05/31/2017  Allergen Reaction Noted  . Atenolol Swelling   . Cephalexin Other (See Comments) 06/24/2008     Review of Systems:    As per HPI, otherwise negative    Physical Exam:  Vital signs in last 24 hours: Temp:  [98.1 F (36.7 C)] 98.1 F (36.7 C) (03/05 0735) Pulse Rate:  [62] 62 (03/05 0735) Resp:  [10] 10 (03/05 0735) BP: (124)/(70) 124/70 (03/05 0735) SpO2:  [97 %] 97 % (03/05 0735) Weight:  [176 lb (79.8 kg)] 176 lb (79.8 kg) (03/05 0735)   General:   Pleasant female in NAD Lungs:  Respirations even and unlabored. Lungs clear to auscultation bilaterally.   No wheezes, crackles, or rhonchi.  Heart:  Regular rate and rhythm; no MRG Abdomen:  Soft, nondistended, nontender. No appreciable masses or hepatomegaly.  Msk:  Symmetrical without gross deformities.  Extremities:  Without edema. Neurologic:  Alert and  oriented x4;  grossly normal neurologically. Skin:  Intact without significant lesions or rashes. Psych:  Alert and cooperative. Normal affect.  Lab Results  Component Value  Date   WBC 8.2 05/31/2017   HGB 14.0 05/31/2017   HCT 42.0 05/31/2017   MCV 85.9 05/31/2017   PLT 281.0 05/31/2017      Impression / Plan:  57 y/o female with a history of UC on Entyvio, history of C diff, with Von willebrand's disease - here for surveillance colonoscopy. I have discussed risks / benefits of colonoscopy and she wanted to proceed.   She follows with Hematology - will use desmopressin prior to the procedure and 24 hours after to minimize bleeding risk. She agreed with the plan.   [place] Cellar, MD [facility] Pager [phone]

## 2017-10-30 NOTE — Discharge Instructions (Signed)

## 2017-10-30 NOTE — Anesthesia Postprocedure Evaluation (Signed)
Anesthesia Post Note  Patient: Lauren [name]  Procedure(s) Performed: COLONOSCOPY WITH PROPOFOL (N/A )     Patient location during evaluation: Endoscopy Anesthesia Type: MAC Level of consciousness: awake and alert Pain management: pain level controlled Vital Signs Assessment: post-procedure vital signs reviewed and stable Respiratory status: spontaneous breathing and respiratory function stable Cardiovascular status: stable Postop Assessment: no apparent nausea or vomiting Anesthetic complications: no    Last Vitals:  Vitals:   10/30/17 0851 10/30/17 0900  BP: 123/66 127/63  Pulse: 70 62  Resp: 16 20  Temp: 36.6 C   SpO2: 100% 96%    Last Pain:  Vitals:   10/30/17 0851  TempSrc: Oral                 Macrae Wiegman DANIEL

## 2017-10-30 NOTE — Transfer of Care (Signed)
Immediate Anesthesia Transfer of Care Note  Patient: Lauren [name]  Procedure(s) Performed: COLONOSCOPY WITH PROPOFOL (N/A )  Patient Location: PACU  Anesthesia Type:MAC  Level of Consciousness: awake, alert , oriented and patient cooperative  Airway & Oxygen Therapy: Patient Spontanous Breathing and Patient connected to face mask oxygen  Post-op Assessment: Report given to RN, Post -op Vital signs reviewed and stable and Patient moving all extremities X 4  Post vital signs: stable  Last Vitals:  Vitals:   10/30/17 0735 10/30/17 0851  BP: 124/70 123/66  Pulse: 62 70  Resp: 10 16  Temp: 36.7 C 36.6 C  SpO2: 97% 100%    Last Pain:  Vitals:   10/30/17 0851  TempSrc: Oral         Complications: No apparent anesthesia complications

## 2017-10-31 ENCOUNTER — Encounter (HOSPITAL_COMMUNITY): Payer: Self-pay | Admitting: Gastroenterology

## 2017-11-01 ENCOUNTER — Other Ambulatory Visit (INDEPENDENT_AMBULATORY_CARE_PROVIDER_SITE_OTHER): Payer: 59

## 2017-11-01 DIAGNOSIS — K51519 Left sided colitis with unspecified complications: Secondary | ICD-10-CM | POA: Diagnosis not present

## 2017-11-01 LAB — BASIC METABOLIC PANEL
BUN: 11 mg/dL (ref 6–23)
CO2: 31 mEq/L (ref 19–32)
Calcium: 9.6 mg/dL (ref 8.4–10.5)
Chloride: 102 mEq/L (ref 96–112)
Creatinine, Ser: 0.69 mg/dL (ref 0.40–1.20)
GFR: 93.31 mL/min (ref >60.00)
Glucose, Bld: 103 mg/dL — ABNORMAL HIGH (ref 70–99)
Potassium: 3.2 mEq/L — ABNORMAL LOW (ref 3.5–5.1)
Sodium: 138 mEq/L (ref 135–145)

## 2017-11-03 ENCOUNTER — Other Ambulatory Visit: Payer: Self-pay | Admitting: Physician Assistant

## 2017-11-03 DIAGNOSIS — I10 Essential (primary) hypertension: Secondary | ICD-10-CM

## 2017-11-05 ENCOUNTER — Telehealth: Payer: Self-pay | Admitting: Physician Assistant

## 2017-11-05 NOTE — Telephone Encounter (Signed)
Copied from [place]. Topic: Quick Communication - Rx Refill/Question >> Nov 05, 2017  3:10 PM Bea Graff, NT wrote: Medication:  metoprolol succinate (TOPROL-XL) 50 MG 24 hr tablet    Has the patient contacted their pharmacy? Yes.     (Agent: If no, request that the patient contact the pharmacy for the refill.)   Preferred Pharmacy (with phone number or street name): Uses [facility] on [address] and [address]: Please be advised that RX refills may take up to 3 business days. We ask that you follow-up with your pharmacy.

## 2017-11-05 NOTE — Telephone Encounter (Signed)
LOV 09/26/16 with [facility]  Had an acute visit with Chelle on 02/09/17  Metoprolol refill  [id number] - [place], [place]

## 2017-11-06 ENCOUNTER — Other Ambulatory Visit: Payer: Self-pay | Admitting: *Deleted

## 2017-11-06 ENCOUNTER — Other Ambulatory Visit: Payer: Self-pay

## 2017-11-06 DIAGNOSIS — K51919 Ulcerative colitis, unspecified with unspecified complications: Secondary | ICD-10-CM

## 2017-11-06 DIAGNOSIS — I10 Essential (primary) hypertension: Secondary | ICD-10-CM

## 2017-11-06 MED ORDER — METOPROLOL SUCCINATE ER 50 MG PO TB24: ORAL_TABLET | ORAL | 0 refills | Status: DC

## 2017-11-07 ENCOUNTER — Encounter: Payer: Self-pay | Admitting: Gastroenterology

## 2017-11-14 ENCOUNTER — Ambulatory Visit: Payer: 59

## 2017-11-15 ENCOUNTER — Ambulatory Visit
Admission: RE | Admit: 2017-11-15 | Discharge: 2017-11-15 | Disposition: A | Discharge status: Home / Self Care | Payer: 59 | Source: Ambulatory Visit | Attending: Physician Assistant | Admitting: Physician Assistant

## 2017-11-15 DIAGNOSIS — Z139 Encounter for screening, unspecified: Secondary | ICD-10-CM

## 2017-11-20 ENCOUNTER — Encounter: Payer: 59 | Admitting: Physician Assistant

## 2017-11-21 ENCOUNTER — Other Ambulatory Visit: Payer: Self-pay

## 2017-11-21 ENCOUNTER — Telehealth: Payer: Self-pay | Admitting: Gastroenterology

## 2017-11-21 DIAGNOSIS — K51919 Ulcerative colitis, unspecified with unspecified complications: Secondary | ICD-10-CM

## 2017-11-21 MED ORDER — VEDOLIZUMAB 300 MG IV SOLR: 300.0000 mg | INTRAVENOUS | 6 refills | Status: DC

## 2017-11-21 NOTE — Telephone Encounter (Signed)
Entyvio levels returned.  Done on 11/07/2017: Entyvio level of 5.5 (subtherapeutic) with NO antibodies.  She recently had some inflammation noted on colonoscopy. Based on her Entyvio level which is subtherapeutic, she would benefit from increase in dosing frequency.  Almyra Free can you please help coordinate Entyvio infusion for every 4 weeks (she was previously on dosing every 8 weeks). [name] will let her know of the plan, you only need to check with the infusion center or wherever she is getting this. Thanks

## 2017-11-21 NOTE — Telephone Encounter (Signed)
Sent in ambulatory GI referral to [facility] to do prior authorization to change the dosing schedule for Entyvio infusion from every 8 weeks to every 4 weeks due to subtherapeutic levels with NO antibodies. Will fax over new Rx with change in dosing schedule to [facility]

## 2017-11-22 ENCOUNTER — Telehealth: Payer: Self-pay | Admitting: Gastroenterology

## 2017-11-22 NOTE — Telephone Encounter (Signed)
Demographic information faxed to [place].

## 2017-11-30 ENCOUNTER — Encounter: Payer: 59 | Admitting: Physician Assistant

## 2017-12-03 ENCOUNTER — Encounter: Payer: Self-pay | Admitting: Physician Assistant

## 2017-12-04 ENCOUNTER — Other Ambulatory Visit: Payer: Self-pay | Admitting: Physician Assistant

## 2017-12-04 DIAGNOSIS — I10 Essential (primary) hypertension: Secondary | ICD-10-CM

## 2017-12-05 NOTE — Telephone Encounter (Signed)
Metoprolol refill request  LOV 09/26/16 with [facility] [phone] - [address] [place] - [place].

## 2017-12-07 ENCOUNTER — Ambulatory Visit (INDEPENDENT_AMBULATORY_CARE_PROVIDER_SITE_OTHER): Payer: 59 | Admitting: Physician Assistant

## 2017-12-07 ENCOUNTER — Other Ambulatory Visit: Payer: Self-pay

## 2017-12-07 ENCOUNTER — Encounter: Payer: Self-pay | Admitting: Physician Assistant

## 2017-12-07 VITALS — BP 132/80 | HR 66 | Temp 98.9°F | Resp 18 | Ht 59.45 in | Wt 180.8 lb

## 2017-12-07 DIAGNOSIS — Z1329 Encounter for screening for other suspected endocrine disorder: Secondary | ICD-10-CM | POA: Diagnosis not present

## 2017-12-07 DIAGNOSIS — Z13220 Encounter for screening for lipoid disorders: Secondary | ICD-10-CM

## 2017-12-07 DIAGNOSIS — Z124 Encounter for screening for malignant neoplasm of cervix: Secondary | ICD-10-CM | POA: Diagnosis not present

## 2017-12-07 DIAGNOSIS — Z0000 Encounter for general adult medical examination without abnormal findings: Secondary | ICD-10-CM | POA: Diagnosis not present

## 2017-12-07 DIAGNOSIS — Z131 Encounter for screening for diabetes mellitus: Secondary | ICD-10-CM

## 2017-12-07 DIAGNOSIS — I10 Essential (primary) hypertension: Secondary | ICD-10-CM

## 2017-12-07 MED ORDER — METOPROLOL SUCCINATE ER 50 MG PO TB24: ORAL_TABLET | ORAL | 0 refills | Status: DC

## 2017-12-07 NOTE — Patient Instructions (Addendum)
   IF you received an x-ray today, you will receive an invoice from [facility]. Please contact [facility] at [phone] with questions or concerns regarding your invoice.   IF you received labwork today, you will receive an invoice from [facility]. Please contact [facility] at [phone] with questions or concerns regarding your invoice.   Our billing staff will not be able to assist you with questions regarding bills from these companies.  You will be contacted with the lab results as soon as they are available. The fastest way to get your results is to activate your My Chart account. Instructions are located on the last page of this paperwork. If you have not heard from us regarding the results in 2 weeks, please contact this office.     Preventive Care 40-64 Years, Female Preventive care refers to lifestyle choices and visits with your health care provider that can promote health and wellness. What does preventive care include?  A yearly physical exam. This is also called an annual well check.  Dental exams once or twice a year.  Routine eye exams. Ask your health care provider how often you should have your eyes checked.  Personal lifestyle choices, including: ? Daily care of your teeth and gums. ? Regular physical activity. ? Eating a healthy diet. ? Avoiding tobacco and drug use. ? Limiting alcohol use. ? Practicing safe sex. ? Taking low-dose aspirin daily starting at age 50. ? Taking vitamin and mineral supplements as recommended by your health care provider. What happens during an annual well check? The services and screenings done by your health care provider during your annual well check will depend on your age, overall health, lifestyle risk factors, and family history of disease. Counseling Your health care provider may ask you questions about your:  Alcohol use.  Tobacco use.  Drug use.  Emotional well-being.  Home and relationship  well-being.  Sexual activity.  Eating habits.  Work and work environment.  Method of birth control.  Menstrual cycle.  Pregnancy history.  Screening You may have the following tests or measurements:  Height, weight, and BMI.  Blood pressure.  Lipid and cholesterol levels. These may be checked every 5 years, or more frequently if you are over 50 years old.  Skin check.  Lung cancer screening. You may have this screening every year starting at age 55 if you have a 30-pack-year history of smoking and currently smoke or have quit within the past 15 years.  Fecal occult blood test (FOBT) of the stool. You may have this test every year starting at age 50.  Flexible sigmoidoscopy or colonoscopy. You may have a sigmoidoscopy every 5 years or a colonoscopy every 10 years starting at age 50.  Hepatitis C blood test.  Hepatitis B blood test.  Sexually transmitted disease (STD) testing.  Diabetes screening. This is done by checking your blood sugar (glucose) after you have not eaten for a while (fasting). You may have this done every 1-3 years.  Mammogram. This may be done every 1-2 years. Talk to your health care provider about when you should start having regular mammograms. This may depend on whether you have a family history of breast cancer.  BRCA-related cancer screening. This may be done if you have a family history of breast, ovarian, tubal, or peritoneal cancers.  Pelvic exam and Pap test. This may be done every 3 years starting at age 21. Starting at age 30, this may be done every 5 years if you   have a Pap test in combination with an HPV test.  Bone density scan. This is done to screen for osteoporosis. You may have this scan if you are at high risk for osteoporosis.  Discuss your test results, treatment options, and if necessary, the need for more tests with your health care provider. Vaccines Your health care provider may recommend certain vaccines, such  as:  Influenza vaccine. This is recommended every year.  Tetanus, diphtheria, and acellular pertussis (Tdap, Td) vaccine. You may need a Td booster every 10 years.  Varicella vaccine. You may need this if you have not been vaccinated.  Zoster vaccine. You may need this after age 60.  Measles, mumps, and rubella (MMR) vaccine. You may need at least one dose of MMR if you were born in 1957 or later. You may also need a second dose.  Pneumococcal 13-valent conjugate (PCV13) vaccine. You may need this if you have certain conditions and were not previously vaccinated.  Pneumococcal polysaccharide (PPSV23) vaccine. You may need one or two doses if you smoke cigarettes or if you have certain conditions.  Meningococcal vaccine. You may need this if you have certain conditions.  Hepatitis A vaccine. You may need this if you have certain conditions or if you travel or work in places where you may be exposed to hepatitis A.  Hepatitis B vaccine. You may need this if you have certain conditions or if you travel or work in places where you may be exposed to hepatitis B.  Haemophilus influenzae type b (Hib) vaccine. You may need this if you have certain conditions.  Talk to your health care provider about which screenings and vaccines you need and how often you need them. This information is not intended to replace advice given to you by your health care provider. Make sure you discuss any questions you have with your health care provider. Document Released: 09/10/2015 Document Revised: 05/03/2016 Document Reviewed: 06/15/2015 Elsevier Interactive Patient Education  2018 Elsevier Inc.  

## 2017-12-07 NOTE — Assessment & Plan Note (Signed)
Controlled. Stable. Continue current treatment.

## 2017-12-07 NOTE — Progress Notes (Signed)
Subjective:    Patient ID: Lauren [name], female    DOB: March 02, 1961, 57 y.o.   MRN: [id number] Chief Complaint  Patient presents with  . Annual Exam    with pap     HPI  57 yo female presents for annual physical exam with pap smear. Denies any complaints at this time and is doing well.   Denies student in the room for pap/breast exam, unable to document on these.  Cervical Cancer Screening: Due for pap, here or gyn? Breast Cancer Screening: mammogram 11/15/2017 Colorectal Cancer Screening: due 10/31/2027 Bone Density Testing: Not yet HIV Screening: complete STI Screening: Denied. Seasonal Influenza Vaccination: Next year Td/Tdap Vaccination: Due 10/28/2023 Pneumococcal Vaccination: Not yet. Zoster Vaccination: Not yet Frequency of Dental evaluation: No referral needed at this time. Last 1.5 years ago. Frequency of Eye evaluation: Scheduled within next month.  Review of Systems  Constitutional: Negative.   HENT: Negative.   Eyes: Negative.   Respiratory: Negative.   Cardiovascular: Negative.   Gastrointestinal: Negative.   Endocrine: Negative.   Genitourinary: Negative.   Musculoskeletal: Negative.   Allergic/Immunologic: Negative.   Neurological: Negative.   Hematological: Negative.   Psychiatric/Behavioral: Negative.     Patient Active Problem List   Diagnosis Date Noted  . Vitamin D insufficiency 09/26/2016  . Microscopic hematuria 09/26/2016  . Left sided colitis without complications ([place])   . Benign neoplasm of colon 07/29/2013  . Von Willebrand disease ([place]) 07/17/2013  . Osteoporosis 04/04/2013  . VITAMIN B12 DEFICIENCY 04/05/2010  . Sinoatrial node dysfunction ([place]) 10/15/2009  . Reactive depression 09/17/2009  . HEMORRHOIDS-EXTERNAL 05/18/2009  . Anxiety state 02/26/2008  . Essential hypertension 02/26/2008  . Ulcerative colitis ([place]) 02/26/2008  . COUGH, CHRONIC 02/26/2008   Past Medical History:  Diagnosis Date  . Anemia   . Cellulitis of left  breast 09/17/2015   diagnosed at Urgent Care- on ABX  . Clotting disorder ([place])   . Depression    takes Prozac daily  . Hearing loss    right ear  . History of colon polyps    benign  . Hypertension    takes Metoprolol daily  . Hypokalemia    takes Potassium every other day  . Inflammatory polyps of colon ([place])   . Internal hemorrhoids   . Osteopenia   . Personal history of meningioma of the brain   . Pneumonia    hx of-about 4+ yrs ago  . Seasonal allergies    takes Allegra as needed and Flonase daily  . SVT (supraventricular tachycardia) ([place])   . Tennis elbow   . Ulcerative colitis    takes Lialda daily and [place] at bedtime  . Urinary urgency   . Vitamin B12 deficiency   . Von Willebrands disease ([place])    Prior to Admission medications   Medication Sig Start Date End Date Taking? Authorizing Provider  Calcium Carbonate-Vit D-Min (CALCIUM 1200) 1200-1000 MG-UNIT CHEW Chew 1 tablet by mouth daily.   Yes [provider]  Cholecalciferol (VITAMIN D3) 2000 units TABS Take 2,000 Units by mouth daily.   Yes [provider]  FLUoxetine (PROZAC) 20 MG capsule TAKE ONE CAPSULE BY MOUTH DAILY Patient taking differently: Take 20 mg by mouth daily 03/22/17  Yes Jeffery, Chelle, PA-C  hydrocortisone 2.5 % ointment Apply 1 application topically 2 (two) times daily as needed (for eczema).   Yes [provider]  mesalamine (LIALDA) 1.2 g EC tablet TAKE 1 TABLET(1.2 GRAMS) BY MOUTH FOUR TIMES DAILY Patient  taking differently: Take 4.8 mg by mouth daily 09/04/17  Yes Armbruster, Carlota Raspberry, MD  metoprolol succinate (TOPROL-XL) 50 MG 24 hr tablet Take 50 mg by mouth daily 11/06/17  Yes Jeffery, Chelle, PA-C  Multiple Vitamin (MULTIVITAMIN) capsule Take 1 capsule by mouth daily.   Yes [provider]  saccharomyces boulardii (FLORASTOR) 250 MG capsule Take 250 mg by mouth daily.   Yes [provider]  STIMATE 1.5 MG/ML SOLN Place 1 spray into both  nostrils See admin instructions. Use 1 spray in each nostril 30-60 minutes prior to procedure 10/02/17  Yes [provider]  vedolizumab (ENTYVIO) 300 MG injection Inject 300 mg into the vein as directed. Every 4 weeks. 11/21/17  Yes Armbruster, Carlota Raspberry, MD  dicyclomine (BENTYL) 10 MG capsule Take 1 capsule (10 mg total) by mouth 4 (four) times daily as needed for spasms. Patient not taking: Reported on 10/17/2017 08/25/16   Ladene Artist, MD  tranexamic acid (LYSTEDA) 650 MG TABS tablet Take 1,300 mg by mouth See admin instructions. Start after procedure, take 1300 mg by mouth 3 times daily for 5 days 09/28/17   [provider]   Allergies  Allergen Reactions  . Atenolol Swelling  . Cephalexin Other (See Comments)    Upset her colitis   Social History   Socioeconomic History  . Marital status: Married    Spouse name: Ronalee Belts  . Number of children: 1  . Years of education: [employer] Diploma  . Highest education level: Not on file  Occupational History  . Occupation: Print production planner: [employer]  Social Needs  . Financial resource strain: Not on file  . Food insecurity:    Worry: Not on file    Inability: Not on file  . Transportation needs:    Medical: Not on file    Non-medical: Not on file  Tobacco Use  . Smoking status: Never Smoker  . Smokeless tobacco: Never Used  Substance and Sexual Activity  . Alcohol use: No    Alcohol/week: 0.0 oz  . Drug use: No  . Sexual activity: Yes    Partners: Male    Birth control/protection: Post-menopausal  Lifestyle  . Physical activity:    Days per week: Not on file    Minutes per session: Not on file  . Stress: Not on file  Relationships  . Social connections:    Talks on phone: Not on file    Gets together: Not on file    Attends religious service: Not on file    Active member of club or organization: Not on file    Attends meetings of clubs or organizations: Not on file     Relationship status: Not on file  . Intimate partner violence:    Fear of current or ex partner: Not on file    Emotionally abused: Not on file    Physically abused: Not on file    Forced sexual activity: Not on file  Other Topics Concern  . Not on file  Social History Narrative   Lives with her husband.   Their daughter lives nearby with her own family.   Family History  Problem Relation Age of Onset  . Heart disease Maternal Grandfather   . Heart disease Mother   . Stroke Mother   . Diabetes Maternal Uncle   . Colon cancer Neg Hx   . Breast cancer Neg Hx    .   Objective:   Physical  Exam  Constitutional: She is oriented to person, place, and time. She appears well-developed and well-nourished. No distress.  HENT:  Head: Normocephalic and atraumatic.  Nose: Nose normal.  Eyes: Pupils are equal, round, and reactive to light. Conjunctivae and EOM are normal. Right eye exhibits no discharge. Left eye exhibits no discharge.  Neck: Normal range of motion. Neck supple. No tracheal deviation present. No thyromegaly present.  Cardiovascular: Normal rate, regular rhythm and intact distal pulses. Exam reveals no gallop and no friction rub.  No murmur heard. Pulses:      Radial pulses are 2+ on the right side.       Dorsalis pedis pulses are 2+ on the right side.       Posterior tibial pulses are 2+ on the right side.  Pulmonary/Chest: Effort normal and breath sounds normal. No respiratory distress. She has no wheezes. She has no rales.  Musculoskeletal: Normal range of motion. She exhibits no edema.  Neurological: She is alert and oriented to person, place, and time. She has normal reflexes.  Skin: Skin is warm and dry. She is not diaphoretic. No erythema.  Psychiatric: She has a normal mood and affect. Her behavior is normal.       Assessment & Plan:  1. Annual physical exam No concerns, will f/u with results of labs. - CBC with Differential/Platelet  2. Screening for  hyperlipidemia Will review labs and f/u with patient. - Lipid panel - CBC with Differential/Platelet - Comprehensive metabolic panel  3. Screening for thyroid disorder Will review labs and f/u with patient. - TSH  4. Screening for diabetes mellitus Will review labs and f/u with patient. - Urinalysis, dipstick only - Comprehensive metabolic panel  5. Essential hypertension HTN is controlled, no changes necessary.

## 2017-12-07 NOTE — Progress Notes (Signed)
Patient ID: Lauren [name], female    DOB: 1961/03/07, 57 y.o.   MRN: [id number]  PCP: Porfirio Oar, PA-C  Chief Complaint  Patient presents with  . Annual Exam    with pap     Subjective:   Presents for [employer] Visit.  Cervical Cancer Screening: No previous abnormal pap. Last 3 years ago, normal cytology. Breast Cancer Screening: mammogram 11/15/2017 Colorectal Cancer Screening: due 10/31/2027 Bone Density Testing: Not yet HIV Screening: complete STI Screening: Denied. Seasonal Influenza Vaccination: current, annually Td/Tdap Vaccination: Due 10/28/2023 Pneumococcal Vaccination: Not yet a candidate. Zoster Vaccination: Not yet a candidate. Frequency of Dental evaluation: No referral needed at this time. Last 1.5 years ago. Frequency of Eye evaluation: Scheduled within next month.    Review of Systems  Constitutional: Negative.   HENT: Negative.   Eyes: Negative.   Respiratory: Negative.   Cardiovascular: Negative.   Gastrointestinal: Negative.   Endocrine: Negative.   Genitourinary: Negative.   Musculoskeletal: Negative.   Skin: Negative.   Allergic/Immunologic: Negative.   Neurological: Negative.   Hematological: Negative.   Psychiatric/Behavioral: Negative.     Patient Active Problem List   Diagnosis Date Noted  . Vitamin D insufficiency 09/26/2016  . Microscopic hematuria 09/26/2016  . Left sided colitis without complications (HCC)   . Benign neoplasm of colon 07/29/2013  . Von Willebrand disease (HCC) 07/17/2013  . Osteoporosis 04/04/2013  . VITAMIN B12 DEFICIENCY 04/05/2010  . Sinoatrial node dysfunction (HCC) 10/15/2009  . Reactive depression 09/17/2009  . HEMORRHOIDS-EXTERNAL 05/18/2009  . Anxiety state 02/26/2008  . Essential hypertension 02/26/2008  . Ulcerative colitis (HCC) 02/26/2008  . COUGH, CHRONIC 02/26/2008    Past Medical History:  Diagnosis Date  . Anemia   . Cellulitis of left breast 09/17/2015   [facility]- on [facility]X  . Clotting disorder (HCC)   . Depression    takes Prozac daily  . Hearing loss    right ear  . History of colon polyps    benign  . Hypertension    takes Metoprolol daily  . Hypokalemia    takes Potassium every other day  . Inflammatory polyps of colon (HCC)   . Internal hemorrhoids   . Osteopenia   . Personal history of meningioma of the brain   . Pneumonia    hx of-about 4+ yrs ago  . Seasonal allergies    takes Allegra as needed and Flonase daily  . SVT (supraventricular tachycardia) (HCC)   . Tennis elbow   . Ulcerative colitis    takes Lialda daily and Rowasa at bedtime  . Urinary urgency   . Vitamin B12 deficiency   . Von Willebrands disease (HCC)      Prior to Admission medications   Medication Sig Start Date End Date Taking? Authorizing Provider  Calcium Carbonate-Vit D-Min (CALCIUM 1200) 1200-1000 MG-UNIT CHEW Chew 1 tablet by mouth daily.   Yes [provider]  Cholecalciferol (VITAMIN D3) 2000 units TABS Take 2,000 Units by mouth daily.   Yes [provider]  FLUoxetine (PROZAC) 20 MG capsule TAKE ONE CAPSULE BY MOUTH DAILY Patient taking differently: Take 20 mg by mouth daily 03/22/17  Yes Kashlyn Salinas, PA-C  hydrocortisone 2.5 % ointment Apply 1 application topically 2 (two) times daily as needed (for eczema).   Yes [provider]  mesalamine (LIALDA) 1.2 g EC tablet TAKE 1 TABLET(1.2 GRAMS) BY MOUTH FOUR TIMES DAILY Patient taking differently: Take 4.8 mg by mouth daily 09/04/17  Yes Armbruster, Willaim Rayas, MD  metoprolol succinate (TOPROL-XL) 50 MG 24 hr tablet Take 50 mg by mouth daily 11/06/17  Yes Worley Radermacher, PA-C  Multiple Vitamin (MULTIVITAMIN) capsule Take 1 capsule by mouth daily.   Yes [provider]  saccharomyces boulardii (FLORASTOR) 250 MG capsule Take 250 mg by mouth daily.   Yes [provider]  STIMATE 1.5 MG/ML SOLN Place 1 spray into both nostrils See admin instructions. Use 1 spray  in each nostril 30-60 minutes prior to procedure 10/02/17  Yes [provider]  vedolizumab (ENTYVIO) 300 MG injection Inject 300 mg into the vein as directed. Every 4 weeks. 11/21/17  Yes Armbruster, Willaim Rayas, MD  dicyclomine (BENTYL) 10 MG capsule Take 1 capsule (10 mg total) by mouth 4 (four) times daily as needed for spasms. Patient not taking: Reported on 10/17/2017 08/25/16   Meryl Dare, MD  tranexamic acid (LYSTEDA) 650 MG TABS tablet Take 1,300 mg by mouth See admin instructions. Start after procedure, take 1300 mg by mouth 3 times daily for 5 days 09/28/17   [provider]    Allergies  Allergen Reactions  . Atenolol Swelling  . Cephalexin Other (See Comments)    Upset her colitis Upset her colitis    Past Surgical History:  Procedure Laterality Date  . BAHA REVISION Right 03/15/2016   Procedure: REVISION RIGHT BAHA HEARING IMPLANT ;  Surgeon: Ermalinda Barrios, MD;  Location: [facility] OR;  Service: ENT;  Laterality: Right;  . BRAIN MENINGIOMA EXCISION  05/2007  . CARDIAC ELECTROPHYSIOLOGY STUDY AND ABLATION  10/22/09  . COLONOSCOPY N/A 07/29/2013   Procedure: COLONOSCOPY;  Surgeon: Hart Carwin, MD;  Location: [facility];  Service: Endoscopy;  Laterality: N/A;  . COLONOSCOPY WITH PROPOFOL N/A 09/21/2015   Procedure: COLONOSCOPY WITH PROPOFOL;  Surgeon: Ruffin Frederick, MD;  Location: [facility];  Service: Gastroenterology;  Laterality: N/A;  . COLONOSCOPY WITH PROPOFOL N/A 10/30/2017   Procedure: COLONOSCOPY WITH PROPOFOL;  Surgeon: Benancio Deeds, MD;  Location: [facility];  Service: Gastroenterology;  Laterality: N/A;  . ESOPHAGOGASTRODUODENOSCOPY    . IMPLANTATION BONE ANCHORED HEARING AID Right   . WISDOM TOOTH EXTRACTION      Family History  Problem Relation Age of Onset  . Heart disease Maternal Grandfather   . Heart disease Mother   . Stroke Mother   . Diabetes Maternal Uncle   . Colon cancer Neg Hx   . Breast cancer Neg Hx     Social  History   Socioeconomic History  . Marital status: Married    Spouse name: Kathlene November  . Number of children: 1  . Years of education: [employer] Diploma  . Highest education level: Not on file  Occupational History  . Occupation: Dispensing optician: [employer]  Social Needs  . Financial resource strain: Not hard at all  . Food insecurity:    Worry: Never true    Inability: Never true  . Transportation needs:    Medical: No    Non-medical: No  Tobacco Use  . Smoking status: Never Smoker  . Smokeless tobacco: Never Used  Substance and Sexual Activity  . Alcohol use: No    Alcohol/week: 0.0 oz  . Drug use: No  . Sexual activity: Yes    Partners: Male    Birth control/protection: Post-menopausal  Lifestyle  . Physical activity:    Days per week: 0 days    Minutes per session: 0 min  .  Stress: Only a little  Relationships  . Social connections:    Talks on phone: More than three times a week    Gets together: Twice a week    Attends religious service: More than 4 times per year    Active member of club or organization: Yes    Attends meetings of clubs or organizations: More than 4 times per year    Relationship status: Married  Other Topics Concern  . Not on file  Social History Narrative   Lives with her husband.   Their daughter lives nearby with her own family.        Objective:  Physical Exam  Constitutional: She is oriented to person, place, and time. Vital signs are normal. She appears well-developed and well-nourished. She is active and cooperative. No distress.  BP 132/80   Pulse 66   Temp 98.9 F (37.2 C) (Oral)   Resp 18   Ht 4' 11.45" (1.51 m)   Wt 180 lb 12.8 oz (82 kg)   LMP 11/26/2008   SpO2 94%   BMI 35.97 kg/m    HENT:  Head: Normocephalic and atraumatic.  Right Ear: Hearing, tympanic membrane, external ear and ear canal normal. No foreign bodies.  Left Ear: Hearing, tympanic membrane, external ear and ear  canal normal. No foreign bodies.  Nose: Nose normal.  Mouth/Throat: Uvula is midline, oropharynx is clear and moist and mucous membranes are normal. No oral lesions. Normal dentition. No dental abscesses or uvula swelling. No oropharyngeal exudate.  Eyes: Pupils are equal, round, and reactive to light. Conjunctivae, EOM and lids are normal. Right eye exhibits no discharge. Left eye exhibits no discharge. No scleral icterus.  Fundoscopic exam:      The right eye shows no arteriolar narrowing, no AV nicking, no exudate, no hemorrhage and no papilledema.       The left eye shows no arteriolar narrowing, no AV nicking, no exudate, no hemorrhage and no papilledema.  Neck: Trachea normal, normal range of motion and full passive range of motion without pain. Neck supple. No spinous process tenderness and no muscular tenderness present. No thyroid mass and no thyromegaly present.  Cardiovascular: Normal rate, regular rhythm, normal heart sounds, intact distal pulses and normal pulses.  Pulmonary/Chest: Effort normal and breath sounds normal. She exhibits no tenderness and no retraction. Right breast exhibits no inverted nipple, no mass, no nipple discharge, no skin change and no tenderness. Left breast exhibits no inverted nipple, no mass, no nipple discharge, no skin change and no tenderness. No breast tenderness, discharge or bleeding. Breasts are symmetrical.  Abdominal: Soft. Normal appearance and bowel sounds are normal. She exhibits no distension and no mass. There is no hepatosplenomegaly. There is no tenderness. There is no rigidity, no rebound, no guarding, no CVA tenderness, no tenderness at McBurney's point and negative Murphy's sign. No hernia. Hernia confirmed negative in the right inguinal area and confirmed negative in the left inguinal area.  Genitourinary: Rectum normal, vagina normal and uterus normal. Rectal exam shows no external hemorrhoid and no fissure. No breast tenderness, discharge or  bleeding. Pelvic exam was performed with patient supine. No labial fusion. There is no rash, tenderness, lesion or injury on the right labia. There is no rash, tenderness, lesion or injury on the left labia. Cervix exhibits no motion tenderness, no discharge and no friability. Right adnexum displays no mass, no tenderness and no fullness. Left adnexum displays no mass, no tenderness and no fullness. No erythema, tenderness or  bleeding in the vagina. No foreign body in the vagina. No signs of injury around the vagina. No vaginal discharge found.  Genitourinary Comments: Resolving erythema of the skin of the buttocks, adjacent to the anus, consistent with recent UC flare, increased moisture and friction. No induration, active lesions concerning for cellulitis or monilial infection.  Musculoskeletal: She exhibits no edema or tenderness.       Cervical back: Normal.       Thoracic back: Normal.       Lumbar back: Normal.  Lymphadenopathy:       Head (right side): No tonsillar, no preauricular, no posterior auricular and no occipital adenopathy present.       Head (left side): No tonsillar, no preauricular, no posterior auricular and no occipital adenopathy present.    She has no cervical adenopathy.    She has no axillary adenopathy.       Right: No inguinal and no supraclavicular adenopathy present.       Left: No inguinal and no supraclavicular adenopathy present.  Neurological: She is alert and oriented to person, place, and time. She has normal strength and normal reflexes. No cranial nerve deficit. She exhibits normal muscle tone. Coordination and gait normal.  Skin: Skin is warm, dry and intact. No rash noted. She is not diaphoretic. No cyanosis or erythema. Nails show no clubbing.  Psychiatric: She has a normal mood and affect. Her speech is normal and behavior is normal. Judgment and thought content normal.     Wt Readings from Last 3 Encounters:  12/07/17 180 lb 12.8 oz (82 kg)  10/30/17  176 lb (79.8 kg)  08/13/17 176 lb 12.8 oz (80.2 kg)     Visual Acuity Screening   Right eye Left eye Both eyes  Without correction:     With correction: 20/13-1 20/13-2 20/13-2         Assessment & Plan:   Problem List Items Addressed This Visit    Essential hypertension    Controlled. Stable. Continue current treatment.      Relevant Medications   metoprolol succinate (TOPROL-XL) 50 MG 24 hr tablet    Other Visit Diagnoses    Annual physical exam    -  Primary   Age appropriate health guidacne provided.   Relevant Orders   CBC with Differential/Platelet   Screening for hyperlipidemia       Relevant Orders   Lipid panel   CBC with Differential/Platelet   Comprehensive metabolic panel   Screening for thyroid disorder       Relevant Orders   TSH   Screening for diabetes mellitus       Relevant Orders   Urinalysis, dipstick only   Comprehensive metabolic panel   Screening for cervical cancer       Relevant Orders   Pap IG and HPV (high risk) DNA detection       Return in about 1 year (around 12/08/2018) for Annual Exam.   Fernande Bras, PA-C Primary Care at [facility]

## 2017-12-08 LAB — URINALYSIS, DIPSTICK ONLY
Bilirubin, UA: NEGATIVE
Glucose, UA: NEGATIVE
Ketones, UA: NEGATIVE
Nitrite, UA: NEGATIVE
Protein, UA: NEGATIVE
RBC, UA: NEGATIVE
Specific Gravity, UA: 1.012 (ref 1.005–1.030)
Urobilinogen, Ur: 0.2 mg/dL (ref 0.2–1.0)
pH, UA: 6 (ref 5.0–7.5)

## 2017-12-08 LAB — COMPREHENSIVE METABOLIC PANEL
ALT: 12 IU/L (ref 0–32)
AST: 19 IU/L (ref 0–40)
Albumin/Globulin Ratio: 1.6 (ref 1.2–2.2)
Albumin: 4.4 g/dL (ref 3.5–5.5)
Alkaline Phosphatase: 81 IU/L (ref 39–117)
BUN/Creatinine Ratio: 12 (ref 9–23)
BUN: 8 mg/dL (ref 6–24)
Bilirubin Total: 0.6 mg/dL (ref 0.0–1.2)
CO2: 26 mmol/L (ref 20–29)
Calcium: 9.2 mg/dL (ref 8.7–10.2)
Chloride: 98 mmol/L (ref 96–106)
Creatinine, Ser: 0.65 mg/dL (ref 0.57–1.00)
GFR calc Af Amer: 115 mL/min/{1.73_m2} (ref >59)
GFR calc non Af Amer: 100 mL/min/{1.73_m2} (ref >59)
Globulin, Total: 2.8 g/dL (ref 1.5–4.5)
Glucose: 73 mg/dL (ref 65–99)
Potassium: 3.4 mmol/L — ABNORMAL LOW (ref 3.5–5.2)
Sodium: 141 mmol/L (ref 134–144)
Total Protein: 7.2 g/dL (ref 6.0–8.5)

## 2017-12-08 LAB — LIPID PANEL
Chol/HDL Ratio: 3.5 ratio (ref 0.0–4.4)
Cholesterol, Total: 206 mg/dL — ABNORMAL HIGH (ref 100–199)
HDL: 59 mg/dL (ref >39)
LDL Calculated: 130 mg/dL — ABNORMAL HIGH (ref 0–99)
Triglycerides: 84 mg/dL (ref 0–149)
VLDL Cholesterol Cal: 17 mg/dL (ref 5–40)

## 2017-12-08 LAB — CBC WITH DIFFERENTIAL/PLATELET
Basophils Absolute: 0 10*3/uL (ref 0.0–0.2)
Basos: 0 %
EOS (ABSOLUTE): 0.2 10*3/uL (ref 0.0–0.4)
Eos: 3 %
Hematocrit: 42 % (ref 34.0–46.6)
Hemoglobin: 14.2 g/dL (ref 11.1–15.9)
Immature Grans (Abs): 0 10*3/uL (ref 0.0–0.1)
Immature Granulocytes: 0 %
Lymphocytes Absolute: 2.4 10*3/uL (ref 0.7–3.1)
Lymphs: 38 %
MCH: 29.3 pg (ref 26.6–33.0)
MCHC: 33.8 g/dL (ref 31.5–35.7)
MCV: 87 fL (ref 79–97)
Monocytes Absolute: 0.4 10*3/uL (ref 0.1–0.9)
Monocytes: 6 %
Neutrophils Absolute: 3.3 10*3/uL (ref 1.4–7.0)
Neutrophils: 53 %
Platelets: 214 10*3/uL (ref 150–379)
RBC: 4.85 x10E6/uL (ref 3.77–5.28)
RDW: 14.5 % (ref 12.3–15.4)
WBC: 6.3 10*3/uL (ref 3.4–10.8)

## 2017-12-08 LAB — TSH: TSH: 1.02 u[IU]/mL (ref 0.450–4.500)

## 2017-12-12 LAB — PAP IG AND HPV HIGH-RISK
HPV, high-risk: NEGATIVE
PAP Smear Comment: 0

## 2017-12-14 ENCOUNTER — Other Ambulatory Visit: Payer: Self-pay | Admitting: Physician Assistant

## 2017-12-14 ENCOUNTER — Other Ambulatory Visit: Payer: Self-pay | Admitting: Gastroenterology

## 2017-12-14 DIAGNOSIS — K5150 Left sided colitis without complications: Secondary | ICD-10-CM

## 2017-12-17 ENCOUNTER — Encounter: Payer: Self-pay | Admitting: Gastroenterology

## 2017-12-21 ENCOUNTER — Telehealth: Payer: Self-pay | Admitting: Physician Assistant

## 2017-12-21 NOTE — Telephone Encounter (Signed)
Copied from [place]. Topic: Quick Communication - See Telephone Encounter >> Dec 21, 2017  5:05 PM Neva Seat wrote: Pt needing to discuss recent pap smear results. Please call pt back asap

## 2017-12-22 NOTE — Telephone Encounter (Signed)
Pap test results are normal. What are her questions?

## 2017-12-24 NOTE — Telephone Encounter (Signed)
Phone call to patient. Reassured pap test is normal, results reviewed with patient. She is appreciative.

## 2018-01-06 ENCOUNTER — Other Ambulatory Visit: Payer: Self-pay | Admitting: Physician Assistant

## 2018-01-06 DIAGNOSIS — I10 Essential (primary) hypertension: Secondary | ICD-10-CM

## 2018-02-09 ENCOUNTER — Encounter: Payer: Self-pay | Admitting: Physician Assistant

## 2018-02-10 ENCOUNTER — Other Ambulatory Visit: Payer: Self-pay | Admitting: Physician Assistant

## 2018-02-10 DIAGNOSIS — I10 Essential (primary) hypertension: Secondary | ICD-10-CM

## 2018-02-11 NOTE — Telephone Encounter (Signed)
Judson Roch   Is it ok to fill.  It is saying very high for allergy

## 2018-02-11 NOTE — Telephone Encounter (Signed)
Pt has been on this in the past - her allergies were updated in April and she has been on this since.  We will do refill for patient.

## 2018-03-14 ENCOUNTER — Telehealth: Payer: Self-pay | Admitting: Physician Assistant

## 2018-03-14 NOTE — Telephone Encounter (Signed)
Copied from [place] [phone]. Topic: Quick Communication - See Telephone Encounter >> Mar 14, 2018  1:49 PM Mylinda Latina, NT wrote: CRM for notification. See Telephone encounter for: 03/14/18. Patient called and states she needs a refill of her FLUoxetine (PROZAC) 20 MG capsule  [facility] [place], [address] [place] AT [address] [phone] (Phone) [phone] (Fax)

## 2018-03-14 NOTE — Telephone Encounter (Signed)
Medication refill  Sent to [facility] pt.

## 2018-03-15 ENCOUNTER — Other Ambulatory Visit: Payer: Self-pay | Admitting: Gastroenterology

## 2018-03-15 DIAGNOSIS — K5150 Left sided colitis without complications: Secondary | ICD-10-CM

## 2018-03-15 MED ORDER — FLUOXETINE HCL 20 MG PO CAPS: ORAL_CAPSULE | ORAL | 0 refills | Status: DC

## 2018-03-15 NOTE — Addendum Note (Signed)
Addended by: Rutherford Guys on: 03/15/2018 07:35 PM   Modules accepted: Orders

## 2018-03-15 NOTE — Telephone Encounter (Signed)
done

## 2018-03-18 NOTE — Telephone Encounter (Signed)
Ok to refill with 90 day and 1 refill?

## 2018-03-18 NOTE — Telephone Encounter (Signed)
Called and spoke to pt. She said she is still taking Lialda daily. Asked for 90 day refill. She wanted to schedule a follow up appt with Dr. Havery Moros for Sept.  Appt scheduled.

## 2018-03-18 NOTE — Telephone Encounter (Signed)
Yes if she is still taking this, please refill it. Thanks

## 2018-04-12 ENCOUNTER — Other Ambulatory Visit: Payer: Self-pay

## 2018-04-12 ENCOUNTER — Other Ambulatory Visit: Payer: 59

## 2018-04-12 ENCOUNTER — Telehealth: Payer: Self-pay | Admitting: Gastroenterology

## 2018-04-12 DIAGNOSIS — R197 Diarrhea, unspecified: Secondary | ICD-10-CM

## 2018-04-12 MED ORDER — VANCOMYCIN HCL 125 MG PO CAPS: 125.0000 mg | ORAL_CAPSULE | Freq: Four times a day (QID) | ORAL | 0 refills | Status: AC

## 2018-04-12 NOTE — Telephone Encounter (Signed)
Routed to DOD, Dr Havery Moros patient, patient has had watery, diarrhea since 8/4, not getting any better. Patient has a history of C diff and states it is like last time.

## 2018-04-12 NOTE — Telephone Encounter (Signed)
Left message for patient on her cell phone with information about test, medication and follow up call next week.

## 2018-04-12 NOTE — Telephone Encounter (Signed)
1. Prescribe vancomycin 125 mg 4 times a day for 14 days 2. Have her come in for stool study for Clostridium difficile 3. Have her give you and Dr. Havery Moros follow-up next week. Thank you

## 2018-04-12 NOTE — Telephone Encounter (Signed)
Ordered C diff test, sent Rx to pharmacy. Sent patient a MyChart message with plan and asked her to call back next week to give update on how she is doing.

## 2018-04-14 LAB — CLOSTRIDIUM DIFFICILE BY PCR: Toxigenic C. Difficile by PCR: POSITIVE — AB

## 2018-04-15 NOTE — Telephone Encounter (Signed)
Thanks Jenny Reichmann and Almyra Free. I reviewed her labs. She is positive for C diff. Almyra Free can you confirm she has been started on vancomycin and see how she is doing? Hopefully this is helping. PLease have her take Florastor twice daily to see if that can help reduce her risk for recurrence. If she has recurrence of symptoms after completing vancomycin she should contact us. Thanks

## 2018-04-15 NOTE — Telephone Encounter (Signed)
Spoke to patient, relayed results and recommendations. Pharmacy did not have the vancomycin available and had to order it. She will pick it up today. She had been taking a probiotic daily, unfortunately had been out of town and did not take it and upon returning home she developed symptoms. She will start using Florastor BID and contact our office later in the week to give an update.

## 2018-05-09 ENCOUNTER — Other Ambulatory Visit: Payer: Self-pay | Admitting: Physician Assistant

## 2018-05-09 DIAGNOSIS — I10 Essential (primary) hypertension: Secondary | ICD-10-CM

## 2018-05-21 ENCOUNTER — Encounter: Payer: Self-pay | Admitting: Gastroenterology

## 2018-05-21 ENCOUNTER — Other Ambulatory Visit (INDEPENDENT_AMBULATORY_CARE_PROVIDER_SITE_OTHER): Payer: 59

## 2018-05-21 ENCOUNTER — Ambulatory Visit: Payer: 59 | Admitting: Gastroenterology

## 2018-05-21 VITALS — BP 140/90 | HR 56 | Ht 59.0 in | Wt 193.0 lb

## 2018-05-21 DIAGNOSIS — Z23 Encounter for immunization: Secondary | ICD-10-CM

## 2018-05-21 DIAGNOSIS — K5150 Left sided colitis without complications: Secondary | ICD-10-CM | POA: Diagnosis not present

## 2018-05-21 LAB — COMPREHENSIVE METABOLIC PANEL
ALT: 11 U/L (ref 0–35)
AST: 15 U/L (ref 0–37)
Albumin: 4.3 g/dL (ref 3.5–5.2)
Alkaline Phosphatase: 77 U/L (ref 39–117)
BUN: 15 mg/dL (ref 6–23)
CO2: 29 mEq/L (ref 19–32)
Calcium: 9.7 mg/dL (ref 8.4–10.5)
Chloride: 102 mEq/L (ref 96–112)
Creatinine, Ser: 0.85 mg/dL (ref 0.40–1.20)
GFR: 73.21 mL/min (ref >60.00)
Glucose, Bld: 94 mg/dL (ref 70–99)
Potassium: 3.4 mEq/L — ABNORMAL LOW (ref 3.5–5.1)
Sodium: 138 mEq/L (ref 135–145)
Total Bilirubin: 0.4 mg/dL (ref 0.2–1.2)
Total Protein: 7.6 g/dL (ref 6.0–8.3)

## 2018-05-21 LAB — CBC WITH DIFFERENTIAL/PLATELET
Basophils Absolute: 0 10*3/uL (ref 0.0–0.1)
Basophils Relative: 0.5 % (ref 0.0–3.0)
Eosinophils Absolute: 1.2 10*3/uL — ABNORMAL HIGH (ref 0.0–0.7)
Eosinophils Relative: 15.2 % — ABNORMAL HIGH (ref 0.0–5.0)
HCT: 42.3 % (ref 36.0–46.0)
Hemoglobin: 14.3 g/dL (ref 12.0–15.0)
Lymphocytes Relative: 29.4 % (ref 12.0–46.0)
Lymphs Abs: 2.4 10*3/uL (ref 0.7–4.0)
MCHC: 33.9 g/dL (ref 30.0–36.0)
MCV: 86.6 fl (ref 78.0–100.0)
Monocytes Absolute: 0.4 10*3/uL (ref 0.1–1.0)
Monocytes Relative: 5.4 % (ref 3.0–12.0)
Neutro Abs: 4 10*3/uL (ref 1.4–7.7)
Neutrophils Relative %: 49.5 % (ref 43.0–77.0)
Platelets: 219 10*3/uL (ref 150.0–400.0)
RBC: 4.88 Mil/uL (ref 3.87–5.11)
RDW: 14 % (ref 11.5–15.5)
WBC: 8 10*3/uL (ref 4.0–10.5)

## 2018-05-21 NOTE — Patient Instructions (Addendum)
If you are age 57 or older, your body mass index should be between 23-30. Your Body mass index is 38.98 kg/m. If this is out of the aforementioned range listed, please consider follow up with your Primary Care Provider.  If you are age 64 or younger, your body mass index should be between 19-25. Your Body mass index is 38.98 kg/m. If this is out of the aformentioned range listed, please consider follow up with your Primary Care Provider.    Continue Entyvio.  Discontinue Lialda.  Call us if your symptoms decline.  Please go to the lab in the basement of our building to have lab work done as you leave today.   We are giving you a flu shot today and a Pneumococcal 13 vaccine today.  You have also been given the Vaccine Information sheets that accompany those vaccines.    Thank you for entrusting me with your care and for choosing [facility], Dr. [place] Cellar

## 2018-05-21 NOTE — Progress Notes (Signed)
HPI :  Colitis History Left sided ulcerative colitis, diagnosed 15-16 years ago. On Remicade in the past for her colitis, which she thinks she did well for a few years. She had a severe MRSA infection of her ear and ultimately Remicade was held and eventually was stopped given she had done well when it was held. She has been on Imuran in the past, she tolerated it well, she thinks it was weaned off given she had been feeling well also. She has never been on Humira or other anti-TNF agents. She has never been on methotrexate. She has been on Lialda for a long time, prior to that Asacol. She has Von Willebrands. She bleeds easily but has never had significant bleeding / hemorrhages in the past. She has had DDAVP infusions prior to her last colonoscopy or Stilmate nose spray. Started on [facility] April 2018 following a few flares that Spring.   Colonoscopy 2014 - active rectal inflammation, inflammatory polyp Colonoscopy 09/21/2015 - overall good control of colitis with small mild area of inflammation in left colon, mild chronic colitis  SINCE LAST VISIT:  57 year old female here for a follow-up visit. Since her last visit she had a colonoscopy done in March, while she was on Entyvio dosed every 8 weeks. Should a normal ileum, with a 10-15 cm segment of mild active inflammation, otherwise normal colon. Biopsies confirmed mildly active colitis. She was also on Lialda 4.8 g a day at the time. In April we obtained an Entyvio trough level which was 5.5, without antibodies. Her dose of Entyvio was increased to every 4 weeks based on that result. She states since having her dose increased she is felt much better. She generally has felt better control of her colitis, she has anywhere from one to a few stools per day. Her bowels are formed. She has no blood in the stools. She has no abdominal pains. She not had any flares of her colitis. Unfortunately she did have worsening diarrhea in August and was positive for  C. Difficile for which she was treated with vancomycin. Her symptoms completely resolved with vancomycin.  In regards to her health care maintenance, she is due for tuberculosis testing at this time. She has not yet had the flu shot, and has never had the pneumonia shot, nor shingles vaccine.  She asked about long-term regimen in regards to if she needs to continue the Lialda at this point. We discussed this issue as below. She is changing insurance in the upcoming few months otherwise.  Colonoscopy 2014 - active rectal inflammation, inflammatory polyp Colonoscopy 09/21/2015 - overall good control of colitis with small mild area of inflammation in left colon, mild chronic colitis Colonoscopy 10/30/2017 - normal ileum, 10-15cm segment of mild inflammation, otherwise normal - mildly active colitis    Past Medical History:  Diagnosis Date  . Anemia   . Cellulitis of left breast 09/17/2015   diagnosed at Urgent Care- on ABX  . Clostridium difficile diarrhea   . Clotting disorder ([place])   . Depression    takes Prozac daily  . Hearing loss    right ear  . History of colon polyps    benign  . Hypertension    takes Metoprolol daily  . Hypokalemia    takes Potassium every other day  . Inflammatory polyps of colon ([place])   . Internal hemorrhoids   . Osteopenia   . Personal history of meningioma of the brain   . 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  Pneumonia    hx of-about 4+ yrs ago  . Seasonal allergies    takes Allegra as needed and Flonase daily  . SVT (supraventricular tachycardia) ([place])   . Tennis elbow   . Ulcerative colitis    On Entyvio q 4 week dosing  . Urinary urgency   . Vitamin B12 deficiency   . Von Willebrands disease [facility])      Past Surgical History:  Procedure Laterality Date  . BAHA REVISION Right 03/15/2016   Procedure: REVISION RIGHT BAHA HEARING IMPLANT ;  Surgeon: Vicie Mutters, MD;  Location: [facility];  Service: ENT;  Laterality: Right;  . BRAIN MENINGIOMA EXCISION   05/2007  . CARDIAC ELECTROPHYSIOLOGY STUDY AND ABLATION  10/22/09  . COLONOSCOPY N/A 07/29/2013   Procedure: COLONOSCOPY;  Surgeon: Lafayette Dragon, MD;  Location: [facility];  Service: Endoscopy;  Laterality: N/A;  . COLONOSCOPY WITH PROPOFOL N/A 09/21/2015   Procedure: COLONOSCOPY WITH PROPOFOL;  Surgeon: Manus Gunning, MD;  Location: [facility];  Service: Gastroenterology;  Laterality: N/A;  . COLONOSCOPY WITH PROPOFOL N/A 10/30/2017   Procedure: COLONOSCOPY WITH PROPOFOL;  Surgeon: Yetta Flock, MD;  Location: [facility];  Service: Gastroenterology;  Laterality: N/A;  . ESOPHAGOGASTRODUODENOSCOPY    . IMPLANTATION BONE ANCHORED HEARING AID Right   . WISDOM TOOTH EXTRACTION     Family History  Problem Relation Age of Onset  . Heart disease Maternal Grandfather   . Heart disease Mother   . Stroke Mother   . Diabetes Maternal Uncle   . Colon cancer Neg Hx   . Breast cancer Neg Hx    Social History   Tobacco Use  . Smoking status: Never Smoker  . Smokeless tobacco: Never Used  Substance Use Topics  . Alcohol use: No    Alcohol/week: 0.0 standard drinks  . Drug use: No   Current Outpatient Medications  Medication Sig Dispense Refill  . Calcium Carbonate-Vit D-Min (CALCIUM 1200) 1200-1000 MG-UNIT CHEW Chew 1 tablet by mouth daily.    Marland Kitchen dicyclomine (BENTYL) 10 MG capsule Take 1 capsule (10 mg total) by mouth 4 (four) times daily as needed for spasms. 30 capsule 0  . FLUoxetine (PROZAC) 20 MG capsule Take 20 mg by mouth daily 90 capsule 0  . hydrocortisone 2.5 % ointment Apply 1 application topically 2 (two) times daily as needed (for eczema).    . mesalamine (LIALDA) 1.2 g EC tablet TAKE 4 TABLETS BY MOUTH DAILY 360 tablet 1  . metoprolol succinate (TOPROL-XL) 50 MG 24 hr tablet TAKE 1 TABLET BY MOUTH DAILY 90 tablet 0  . Multiple Vitamin (MULTIVITAMIN) capsule Take 1 capsule by mouth daily.    Marland Kitchen saccharomyces boulardii (FLORASTOR) 250 MG capsule Take 250 mg by  mouth daily.    Marland Kitchen STIMATE 1.5 MG/ML SOLN Place 1 spray into both nostrils See admin instructions. Use 1 spray in each nostril 30-60 minutes prior to procedure  1  . tranexamic acid (LYSTEDA) 650 MG TABS tablet Take 1,300 mg by mouth See admin instructions. Start after procedure, take 1300 mg by mouth 3 times daily for 5 days  2  . vedolizumab (ENTYVIO) 300 MG injection Inject 300 mg into the vein as directed. Every 4 weeks. 1 vial 6   No current facility-administered medications for this visit.    Allergies  Allergen Reactions  . Atenolol Swelling  . Asa [Aspirin]     Platelet disorder   . Cephalexin Other (See Comments)    Upset her colitis Upset  her colitis  . Ibuprofen     Platelet disrder     Review of Systems: All systems reviewed and negative except where noted in HPI.   Lab Results  Component Value Date   WBC 6.3 12/07/2017   HGB 14.2 12/07/2017   HCT 42.0 12/07/2017   MCV 87 12/07/2017   PLT 214 12/07/2017    Lab Results  Component Value Date   CREATININE 0.65 12/07/2017   BUN 8 12/07/2017   NA 141 12/07/2017   K 3.4 (L) 12/07/2017   CL 98 12/07/2017   CO2 26 12/07/2017    Lab Results  Component Value Date   ALT 12 12/07/2017   AST 19 12/07/2017   ALKPHOS 81 12/07/2017   BILITOT 0.6 12/07/2017      Physical Exam: BP 140/90 (BP Location: Left Arm, Patient Position: Sitting, Cuff Size: Normal)   Pulse (!) 56   Ht 4\' 11"  (1.499 m) Comment: height measured without shoes  Wt 193 lb (87.5 kg)   LMP 11/26/2008   BMI 38.98 kg/m  Constitutional: Pleasant,well-developed, female in no acute distress. HEENT: Normocephalic and atraumatic. Conjunctivae are normal. No scleral icterus. Neck supple.  Cardiovascular: Normal rate, regular rhythm.  Pulmonary/chest: Effort normal and breath sounds normal. No wheezing, rales or rhonchi. Abdominal: Soft, nondistended, nontender. There are no masses palpable. No hepatomegaly. Extremities: no edema Lymphadenopathy:  No cervical adenopathy noted. Neurological: Alert and oriented to person place and time. Skin: Skin is warm and dry. No rashes noted. Psychiatric: Normal mood and affect. Behavior is normal.   ASSESSMENT AND PLAN: 57 year old female here for reassessment following issues:  Left sided colitis - longstanding disease, prior severe MRSA infection on Remicade, eventually transitioned to [facility] April 2018. Generally has done well on Entyvio although had some mildly active disease on surveillance colonoscopy. Entyvio level was low at 5.5, without antibodies, her dosing was increased to every 4 weeks and she has clinically had a nice response. Bowel habits normal at this time without any active symptoms. She did have an episode of C. Difficile which required therapy in August. We discussed that she is at risk for C. Difficile given her history of this and being on Entyvio, although long-term hopefully [facility] is the safest option to manage her colitis in regards to risks of biologic therapies. We otherwise discussed whether or not she needs mesalamine at this time. She's been on this for a long time however I think now doing well on high-dose of a biologic, mesalamine is likely not providing much additional benefit. I think okay to stop the Lialda at this time, and she will monitor symptoms. In regards to her health care maintenance, she is due for basic labs today as well as screening for tuberculosis. We discussed her vaccination history, I'm recommending the flu shot as well as PCV 13 vaccine today. She was agreeable with this. She will follow-up with her primary care discussed getting a shingles vaccine. Continue regimen for now, I would like to see her back in the office in 6 months for reassessment.  [place] Cellar, MD [facility]  CC: Harrison Mons, [place]

## 2018-05-24 LAB — QUANTIFERON-TB GOLD PLUS
Mitogen-NIL: >10 IU/mL
NIL: 0.03 IU/mL
QuantiFERON-TB Gold Plus: NEGATIVE
TB1-NIL: <0 IU/mL
TB2-NIL: <0 IU/mL

## 2018-06-12 ENCOUNTER — Other Ambulatory Visit: Payer: Self-pay | Admitting: Family Medicine

## 2018-06-12 NOTE — Telephone Encounter (Signed)
Left VM regarding refill request. Pt needs appt; needs new provider.

## 2018-06-21 ENCOUNTER — Ambulatory Visit: Payer: 59 | Admitting: Family Medicine

## 2018-06-26 ENCOUNTER — Other Ambulatory Visit: Payer: Self-pay

## 2018-06-26 ENCOUNTER — Other Ambulatory Visit: Payer: 59

## 2018-06-26 ENCOUNTER — Telehealth: Payer: Self-pay | Admitting: Gastroenterology

## 2018-06-26 DIAGNOSIS — R197 Diarrhea, unspecified: Secondary | ICD-10-CM

## 2018-06-26 MED ORDER — VANCOMYCIN HCL 125 MG PO CAPS: 125.0000 mg | ORAL_CAPSULE | Freq: Four times a day (QID) | ORAL | 0 refills | Status: AC

## 2018-06-26 NOTE — Telephone Encounter (Signed)
Patient states she thinks she is getting cdiff again and would like to speak with the nurse.

## 2018-06-26 NOTE — Telephone Encounter (Signed)
Sorry to hear this. Can you have her go to the lab for C Diff stool test to confirm? She has had this 2 x's in the past. I think I would also order her oral vancomycin course for 2 weeks to treat it, she can pick it up after the stool study. Thanks

## 2018-06-26 NOTE — Addendum Note (Signed)
Addended by: Boris Lown B on: 06/26/2018 02:30 PM   Modules accepted: Orders

## 2018-06-26 NOTE — Telephone Encounter (Signed)
Sorry, 125 mg 4 times daily for 14 days. She should also take florastor BID for this. Thanks

## 2018-06-26 NOTE — Telephone Encounter (Signed)
Patient for last couple of days has "started feeling icky" describes it like she gets prior to having Cdiff. She is still having formed stools, but is gassy, foul smelling stool, mucus in stools. Denies being on any antibiotic in last couple of weeks. She will have her Entyvio infusion tomorrow. She also asks if you think she needs medication, to please call it in today so she could pick it up with her current insurance.

## 2018-06-26 NOTE — Telephone Encounter (Signed)
Okay, do you want to give vancomycin 125 mg QID? Or some other strength, thank you.

## 2018-06-26 NOTE — Addendum Note (Signed)
Addended by: Boris Lown B on: 06/26/2018 02:33 PM   Modules accepted: Orders

## 2018-06-27 ENCOUNTER — Other Ambulatory Visit: Payer: 59

## 2018-06-27 ENCOUNTER — Other Ambulatory Visit: Payer: Self-pay

## 2018-06-27 ENCOUNTER — Ambulatory Visit: Payer: 59 | Admitting: Physician Assistant

## 2018-06-27 ENCOUNTER — Encounter: Payer: Self-pay | Admitting: Physician Assistant

## 2018-06-27 VITALS — BP 150/96 | HR 61 | Temp 98.3°F | Ht 59.0 in | Wt 193.2 lb

## 2018-06-27 DIAGNOSIS — I10 Essential (primary) hypertension: Secondary | ICD-10-CM

## 2018-06-27 DIAGNOSIS — F411 Generalized anxiety disorder: Secondary | ICD-10-CM

## 2018-06-27 DIAGNOSIS — R197 Diarrhea, unspecified: Secondary | ICD-10-CM

## 2018-06-27 MED ORDER — FLUOXETINE HCL 20 MG PO CAPS: ORAL_CAPSULE | ORAL | 0 refills | Status: DC

## 2018-06-27 MED ORDER — METOPROLOL SUCCINATE ER 50 MG PO TB24: 50.0000 mg | ORAL_TABLET | Freq: Every day | ORAL | 0 refills | Status: DC

## 2018-06-27 NOTE — Progress Notes (Signed)
bm    MRN: [id number] DOB: 09-10-1960  Subjective:   Lauren [name] is a 57 y.o. female presenting for follow up on Hypertension. Currently managed with metoprolol 50mg  daily. Patient is checking blood pressure at home, range is 433I systolic. Marland Kitchen Denies lightheadedness, dizziness, chronic headache, double vision, chest pain, shortness of breath, heart racing, palpitations, nausea, vomiting, abdominal pain, hematuria, lower leg swelling. Lifestyle: Raisin bran cereal, vegetables, canned salmon, some beef. Drinks some water and some sweet tea. No structured exercise.Denies smoking .   Has been on prozac 20mg  daily for at least 15 years for stress. Not sure she needs it anymore. Denies dysphoric mood, decreased interest, panic attacks, SI, and HI. Has tried stopping it a few times in the past but major life things happened and she got back on it.   Lauren [name] has a current medication list which includes the following prescription(s): calcium 1200, dicyclomine, fluoxetine, hydrocortisone, metoprolol succinate, multivitamin, saccharomyces boulardii, stimate, tranexamic acid, vancomycin, and vedolizumab. Also is allergic to atenolol; asa [aspirin]; cephalexin; and ibuprofen.  Lauren [name]  has a past medical history of Anemia, Cellulitis of left breast (09/17/2015), Clostridium difficile diarrhea, Clotting disorder ([place]), Depression, Hearing loss, History of colon polyps, Hypertension, Hypokalemia, Inflammatory polyps of colon ([place]), Internal hemorrhoids, Osteopenia, Personal history of meningioma of the brain, Platelet disorder ([place]), Pneumonia, Seasonal allergies, SVT (supraventricular tachycardia) ([place]), Tennis elbow, Ulcerative colitis, Urinary urgency, Vitamin B12 deficiency, and Von Willebrands disease ([place]). Also  has a past surgical history that includes Implantation bone anchored hearing aid (Right); Cardiac electrophysiology study and ablation (10/22/09); Colonoscopy (N/A, 07/29/2013); Wisdom tooth extraction;  Brain meningioma excision (05/2007); Colonoscopy with propofol (N/A, 09/21/2015); Esophagogastroduodenoscopy; BAHA revision (Right, 03/15/2016); and Colonoscopy with propofol (N/A, 10/30/2017).   Objective:   Vitals: BP (!) 146/84 (BP Location: Left Arm, Patient Position: Sitting, Cuff Size: Normal)   Pulse 61   Temp 98.3 F (36.8 C) (Oral)   Ht 4\' 11"  (1.499 m)   Wt 193 lb 3.2 oz (87.6 kg)   LMP 11/26/2008   SpO2 92%   BMI 39.02 kg/m   Physical Exam  Constitutional: She is oriented to person, place, and time. She appears well-developed and well-nourished. No distress.  HENT:  Head: Normocephalic and atraumatic.  Mouth/Throat: Uvula is midline, oropharynx is clear and moist and mucous membranes are normal.  Eyes: Pupils are equal, round, and reactive to light. Conjunctivae and EOM are normal.  Neck: Normal range of motion.  Cardiovascular: Normal rate, regular rhythm, normal heart sounds and intact distal pulses.  Pulmonary/Chest: Effort normal and breath sounds normal. She has no wheezes. She has no rhonchi. She has no rales.  Musculoskeletal:       Right lower leg: She exhibits no swelling.       Left lower leg: She exhibits no swelling.  Neurological: She is alert and oriented to person, place, and time.  Skin: Skin is warm and dry.  Psychiatric: She has a normal mood and affect.  Vitals reviewed.   No results found for this or any previous visit (from the past 24 hour(s)).   BP Readings from Last 3 Encounters:  06/27/18 (!) 146/84  05/21/18 140/90  12/07/17 132/80   Depression screen PHQ 2/9 06/27/2018 12/07/2017 02/09/2017 09/26/2016 12/19/2015  Decreased Interest 0 0 0 0 0  Down, Depressed, Hopeless 0 0 0 0 0  PHQ - 2 Score 0 0 0 0 0  Some recent data might be hidden   GAD 7 : Generalized Anxiety Score 06/27/2018  Nervous, Anxious, on Edge 0  Control/stop worrying 0  Worry too much - different things 0  Trouble relaxing 1  Restless 1  Easily annoyed or irritable 0    Afraid - awful might happen 0  Total GAD 7 Score 2      Assessment and Plan :  1. Essential hypertension Slightly uncontrolled in office. Home bp readings wnl. Asymptomatic. Instructed to check bp outside of office over the next couple of weeks. Return if consistently >140/90. Given strict ED precautions. Otherwise, f/u  In 3 months. - metoprolol succinate (TOPROL-XL) 50 MG 24 hr tablet; Take 1 tablet (50 mg total) by mouth daily. Take with or immediately following a meal.  Dispense: 90 tablet; Refill: 0 - Basic metabolic panel  2. Anxiety state Well controlled at this time. Discussed decreasing prozac dose. Pt would like to stay at current dose for time being due to insurance purposes. Rec f/u in 3 months, can considering decreasing to 10mg  daily at that time.  - FLUoxetine (PROZAC) 20 MG capsule; Take 20 mg by mouth daily  Dispense: 90 capsule; Refill: 0  Tenna Delaine, PA-C  [facility] 06/27/2018 4:20 PM

## 2018-06-27 NOTE — Patient Instructions (Addendum)
In terms of elevated blood pressure, I would like you to check your blood pressure at least a couple times over the next week outside of the office and document these values. It is best if you check the blood pressure at different times in the day. Your goal is <140/90. If your values are consistently above this goal, please return to office for further evaluation. If you start to have chest pain, blurred vision, shortness of breath, severe headache, lower leg swelling, or nausea/vomiting please seek care immediately here or at the ED.   For anxiety, you can continue prozac as you are. We can discuss cutting it down to 10mg  at your follow up visit.    If you have lab work done today you will be contacted with your lab results within the next 2 weeks.  If you have not heard from [place] then please contact us. The fastest way to get your results is to register for My Chart.  DASH Eating Plan DASH stands for "Dietary Approaches to Stop Hypertension." The DASH eating plan is a healthy eating plan that has been shown to reduce high blood pressure (hypertension). It may also reduce your risk for type 2 diabetes, heart disease, and stroke. The DASH eating plan may also help with weight loss. What are tips for following this plan? General guidelines  Avoid eating more than 2,300 mg (milligrams) of salt (sodium) a day. If you have hypertension, you may need to reduce your sodium intake to 1,500 mg a day.  Limit alcohol intake to no more than 1 drink a day for nonpregnant women and 2 drinks a day for men. One drink equals 12 oz of beer, 5 oz of wine, or 1 oz of hard liquor.  Work with your health care provider to maintain a healthy body weight or to lose weight. Ask what an ideal weight is for you.  Get at least 30 minutes of exercise that causes your heart to beat faster (aerobic exercise) most days of the week. Activities may include walking, swimming, or biking.  Work with your health care provider or  diet and nutrition specialist (dietitian) to adjust your eating plan to your individual calorie needs. Reading food labels  Check food labels for the amount of sodium per serving. Choose foods with less than 5 percent of the Daily Value of sodium. Generally, foods with less than 300 mg of sodium per serving fit into this eating plan.  To find whole grains, look for the word "whole" as the first word in the ingredient list. Shopping  Buy products labeled as "low-sodium" or "no salt added."  Buy fresh foods. Avoid canned foods and premade or frozen meals. Cooking  Avoid adding salt when cooking. Use salt-free seasonings or herbs instead of table salt or sea salt. Check with your health care provider or pharmacist before using salt substitutes.  Do not fry foods. Cook foods using healthy methods such as baking, boiling, grilling, and broiling instead.  Cook with heart-healthy oils, such as olive, canola, soybean, or sunflower oil. Meal planning   Eat a balanced diet that includes: ? 5 or more servings of fruits and vegetables each day. At each meal, try to fill half of your plate with fruits and vegetables. ? Up to 6-8 servings of whole grains each day. ? Less than 6 oz of lean meat, poultry, or fish each day. A 3-oz serving of meat is about the same size as a deck of cards. One egg equals 1  oz. ? 2 servings of low-fat dairy each day. ? A serving of nuts, seeds, or beans 5 times each week. ? Heart-healthy fats. Healthy fats called Omega-3 fatty acids are found in foods such as flaxseeds and coldwater fish, like sardines, salmon, and mackerel.  Limit how much you eat of the following: ? Canned or prepackaged foods. ? Food that is high in trans fat, such as fried foods. ? Food that is high in saturated fat, such as fatty meat. ? Sweets, desserts, sugary drinks, and other foods with added sugar. ? Full-fat dairy products.  Do not salt foods before eating.  Try to eat at least 2  vegetarian meals each week.  Eat more home-cooked food and less restaurant, buffet, and fast food.  When eating at a restaurant, ask that your food be prepared with less salt or no salt, if possible. What foods are recommended? The items listed may not be a complete list. Talk with your dietitian about what dietary choices are best for you. Grains Whole-grain or whole-wheat bread. Whole-grain or whole-wheat pasta. Brown rice. [place]. Bulgur. Whole-grain and low-sodium cereals. Pita bread. Low-fat, low-sodium crackers. Whole-wheat flour tortillas. Vegetables Fresh or frozen vegetables (raw, steamed, roasted, or grilled). Low-sodium or reduced-sodium tomato and vegetable juice. Low-sodium or reduced-sodium tomato sauce and tomato paste. Low-sodium or reduced-sodium canned vegetables. Fruits All fresh, dried, or frozen fruit. Canned fruit in natural juice (without added sugar). Meat and other protein foods Skinless chicken or [place]. Ground chicken or [place]. Pork with fat trimmed off. Fish and seafood. Egg whites. Dried beans, peas, or lentils. Unsalted nuts, nut butters, and seeds. Unsalted canned beans. Lean cuts of beef with fat trimmed off. Low-sodium, lean deli meat. Dairy Low-fat (1%) or fat-free (skim) milk. Fat-free, low-fat, or reduced-fat cheeses. Nonfat, low-sodium ricotta or cottage cheese. Low-fat or nonfat yogurt. Low-fat, low-sodium cheese. Fats and oils Soft margarine without trans fats. Vegetable oil. Low-fat, reduced-fat, or light mayonnaise and salad dressings (reduced-sodium). Canola, safflower, olive, soybean, and sunflower oils. Avocado. Seasoning and other foods Herbs. Spices. Seasoning mixes without salt. Unsalted popcorn and pretzels. Fat-free sweets. What foods are not recommended? The items listed may not be a complete list. Talk with your dietitian about what dietary choices are best for you. Grains Baked goods made with fat, such as croissants, muffins, or  some breads. Dry pasta or rice meal packs. Vegetables Creamed or fried vegetables. Vegetables in a cheese sauce. Regular canned vegetables (not low-sodium or reduced-sodium). Regular canned tomato sauce and paste (not low-sodium or reduced-sodium). Regular tomato and vegetable juice (not low-sodium or reduced-sodium). Angie Fava. Olives. Fruits Canned fruit in a light or heavy syrup. Fried fruit. Fruit in cream or butter sauce. Meat and other protein foods Fatty cuts of meat. Ribs. Fried meat. Berniece Salines. Sausage. Bologna and other processed lunch meats. Salami. Fatback. Hotdogs. Bratwurst. Salted nuts and seeds. Canned beans with added salt. Canned or smoked fish. Whole eggs or egg yolks. Chicken or [place] with skin. Dairy Whole or 2% milk, cream, and half-and-half. Whole or full-fat cream cheese. Whole-fat or sweetened yogurt. Full-fat cheese. Nondairy creamers. Whipped toppings. Processed cheese and cheese spreads. Fats and oils Butter. Stick margarine. Lard. Shortening. Ghee. Bacon fat. Tropical oils, such as coconut, palm kernel, or palm oil. Seasoning and other foods Salted popcorn and pretzels. Onion salt, garlic salt, seasoned salt, table salt, and sea salt. Worcestershire sauce. Tartar sauce. Barbecue sauce. Teriyaki sauce. Soy sauce, including reduced-sodium. Steak sauce. Canned and packaged gravies. Fish sauce. Oyster sauce.  Cocktail sauce. Horseradish that you find on the shelf. Ketchup. Mustard. Meat flavorings and tenderizers. Bouillon cubes. Hot sauce and Tabasco sauce. Premade or packaged marinades. Premade or packaged taco seasonings. Relishes. Regular salad dressings. Where to find more information:  National Heart, Lung, and [place]: https://wilson-eaton.com/  American Heart Association: www.heart.org Summary  The DASH eating plan is a healthy eating plan that has been shown to reduce high blood pressure (hypertension). It may also reduce your risk for type 2 diabetes, heart disease,  and stroke.  With the DASH eating plan, you should limit salt (sodium) intake to 2,300 mg a day. If you have hypertension, you may need to reduce your sodium intake to 1,500 mg a day.  When on the DASH eating plan, aim to eat more fresh fruits and vegetables, whole grains, lean proteins, low-fat dairy, and heart-healthy fats.  Work with your health care provider or diet and nutrition specialist (dietitian) to adjust your eating plan to your individual calorie needs. This information is not intended to replace advice given to you by your health care provider. Make sure you discuss any questions you have with your health care provider. Document Released: 08/03/2011 Document Revised: 08/07/2016 Document Reviewed: 08/07/2016 Elsevier Interactive Patient Education  2018 [employer].   IF you received an x-ray today, you will receive an invoice from [facility]. Please contact [facility] at [phone] with questions or concerns regarding your invoice.   IF you received labwork today, you will receive an invoice from [facility]. Please contact [facility] at [phone] with questions or concerns regarding your invoice.   Our billing staff will not be able to assist you with questions regarding bills from these companies.  You will be contacted with the lab results as soon as they are available. The fastest way to get your results is to activate your My Chart account. Instructions are located on the last page of this paperwork. If you have not heard from [place] regarding the results in 2 weeks, please contact this office.

## 2018-06-28 LAB — BASIC METABOLIC PANEL
BUN/Creatinine Ratio: 18 (ref 9–23)
BUN: 13 mg/dL (ref 6–24)
CO2: 24 mmol/L (ref 20–29)
Calcium: 9.5 mg/dL (ref 8.7–10.2)
Chloride: 103 mmol/L (ref 96–106)
Creatinine, Ser: 0.71 mg/dL (ref 0.57–1.00)
GFR calc Af Amer: 109 mL/min/{1.73_m2} (ref >59)
GFR calc non Af Amer: 95 mL/min/{1.73_m2} (ref >59)
Glucose: 93 mg/dL (ref 65–99)
Potassium: 3.7 mmol/L (ref 3.5–5.2)
Sodium: 144 mmol/L (ref 134–144)

## 2018-06-29 LAB — CLOSTRIDIUM DIFFICILE EIA: C difficile Toxins A+B, EIA: NEGATIVE

## 2018-07-01 ENCOUNTER — Telehealth: Payer: Self-pay | Admitting: Gastroenterology

## 2018-07-01 NOTE — Telephone Encounter (Signed)
Left message for pt to call back  °

## 2018-07-03 ENCOUNTER — Ambulatory Visit: Payer: Self-pay | Admitting: *Deleted

## 2018-07-03 NOTE — Telephone Encounter (Signed)
Contacted pt regarding her blood pressure; she had previously called stating that she was seen on 06/27/2018 by Tenna Delaine for her BP; she is concerned that her readings; all readings were taken with an automatic cuff on her both arms; she is not sure which arm she used for the readings; the readings are: 07/02/18 at 1330 154/?; and at 1754 134/81; 07/03/18 at 0300 131/91 and at 0840 157/89 (before taking med); at 1415 167/94; and at1445 156/80 at fire dept with manual cuff;the pt she wants to know if she can double up her meds or what she should do; pt advised not to double medication; she would like for [place] to review her this information and call with recommendations; she reports that she has no symptoms; the pt says that she is going out of town on Friday early afternoon until 07/08/18; if calling between [place] -1p call [phone] (work); otherwise please call her cell phone [phone]; explained to pt that [place] is scheduled to be back in the office on 07/04/18, and it is likely that her request may not be reviewed until then' she verbalized understanding; will route to office for provider review; also notified Sayre at [place].                                                                 Reason for Disposition . Systolic BP  >= 220 OR Diastolic >= 254  Answer Assessment - Initial Assessment Questions 1. BLOOD PRESSURE: "What is the blood pressure?" "Did you take at least two measurements 5 minutes apart?"     07/03/18 at 1445: 156/80 manual cuff at fire dept and 167/94 at 1415 2. ONSET: "When did you take your blood pressure?"     07/03/18 at 1445 and 1415 3. HOW: "How did you obtain the blood pressure?" (e.g., visiting nurse, automatic home BP monitor)     Automatic home cuff 4. HISTORY: "Do you have a history of high blood pressure?"     yes 5. MEDICATIONS: "Are you taking any medications for blood pressure?" "Have you missed any doses recently?"     no 6. OTHER SYMPTOMS:  "Do you have any symptoms?" (e.g., headache, chest pain, blurred vision, difficulty breathing, weakness)     none 7. PREGNANCY: "Is there any chance you are pregnant?" "When was your last menstrual period?"     no  Protocols used: HIGH BLOOD PRESSURE-A-AH

## 2018-07-03 NOTE — Telephone Encounter (Signed)
Have not heard back from patient, sent a MyChart message.

## 2018-07-05 NOTE — Telephone Encounter (Signed)
Message sent to [place] re: BP readings.

## 2018-07-05 NOTE — Telephone Encounter (Signed)
Please call pt, per my office note, she should return to office if bp readings are elevated >140/90. Please have her schedule an appointment so we can get an in office reading.  Please have her bring her home bp cuff to that visit so we can compare it to our in office readings. She should not double her medication.

## 2018-07-08 NOTE — Telephone Encounter (Signed)
Spoke with patient regarding message about her BP readings. Pt stated that she doesn't want to come in for blood pressure readings. She wants to bring readings that she has from [facility], along with the one's that she have from my chart. "I don't want to do a another visit. I don't see coming in paying money for another visit."  I asked the patient how long has she had the blood pressure cuff, and she stated for years. She is willing to purchase a new bp cuff. She would like to a call back.

## 2018-07-09 NOTE — Telephone Encounter (Signed)
Please call pt. Unfortunately, with elevated readings like that, we cannot just rely on her home bp cuff. She will need to bring her blood pressure cuff to our office and have it compared to our in office readings. If bp elevated in office to that degree,  we will likely need to add additional medication as we cannot just increase her metoprolol due to her heart rate being at low end of normal last visit, which we do not do over phone messages but in office visits.

## 2018-07-09 NOTE — Telephone Encounter (Signed)
Left detailed message.   

## 2018-09-25 ENCOUNTER — Encounter: Payer: Self-pay | Admitting: Gastroenterology

## 2018-09-25 ENCOUNTER — Ambulatory Visit (INDEPENDENT_AMBULATORY_CARE_PROVIDER_SITE_OTHER): Payer: BLUE CROSS/BLUE SHIELD | Admitting: Gastroenterology

## 2018-09-25 VITALS — BP 110/60 | HR 93 | Ht 59.0 in | Wt 183.0 lb

## 2018-09-25 DIAGNOSIS — K51518 Left sided colitis with other complication: Secondary | ICD-10-CM

## 2018-09-25 MED ORDER — MESALAMINE 4 G RE ENEM: 4.0000 g | ENEMA | RECTAL | 1 refills | Status: DC

## 2018-09-25 NOTE — Progress Notes (Signed)
HPI :  Colitis History Left sided ulcerative colitis, diagnosed > 15 years ago. On Remicade in the past for her colitis, which she thinks she did well for a few years. She had a severe MRSA infection of her ear and ultimately Remicade was held and eventually was stopped given she had done well when it was held. She has been on Imuran in the past, she tolerated it well, she thinks it was weaned off given she had been feeling well also. She has never been on Humira or other anti-TNF agents. She has never been on methotrexate. She has been on Lialda for a long time, prior to that Asacol. She has Von Willebrands. She bleeds easily but has never had significant bleeding / hemorrhages in the past. She has had DDAVP infusions prior to her last colonoscopy or Stilmate nose spray. Started on [facility] April 2018 following a few flares that Spring.   SINCE LAST VISIT:  58 year old female here for a follow-up visit.  Colonoscopy done in March, while she was on Entyvio dosed every 8 weeks. Showed a normal ileum, with a 10-15 cm segment of mild active inflammation, otherwise normal colon. Biopsies confirmed mildly active colitis. She was also on Lialda 4.8 g a day at the time. In April we obtained an Entyvio trough level which was 5.5, without antibodies. Her dose of Entyvio was increased to every 4 weeks based on that result. She states since having her dose increased she initially did well with better control of symptoms. Unfortunately she did have worsening diarrhea in August and was positive for C. Difficile for which she was treated with vancomycin. Her symptoms completely resolved with vancomycin.  Unfortunately she's not been doing well in the past 1-2 months, she thinks her colitis is active. She has increased stool frequency with about 6 pounds today, having some occasional nocturnal symptoms. She has been noticing some blood and mucus in her stools as well as urgency. She is feeling a bit more fatigue.  She had her last dose of Entyvio 2 days ago. Last C Diff test in October was negative.  TB testing negative 05/21/18 PCV 13 04/2018 Flu shot UTD  Colonoscopy 2014 - active rectal inflammation, inflammatory polyp Colonoscopy 09/21/2015 - overall good control of colitis with small mild area of inflammation in left colon, mild chronic colitis Colonoscopy 10/30/2017 - normal ileum, 10-15cm segment of mild inflammation, otherwise normal - mildly active colitis  Labs 08/30/18 - WBC 9.2, Hgb 12.8, plt 339 Normal renal function and LFTs, ESR 15, CRP 16      Past Medical History:  Diagnosis Date  . Anemia   . Cellulitis of left breast 09/17/2015   diagnosed at Urgent Care- on ABX  . Clostridium difficile diarrhea   . Clotting disorder ([place])   . Depression    takes Prozac daily  . Hearing loss    right ear  . History of colon polyps    benign  . Hypertension    takes Metoprolol daily  . Hypokalemia    takes Potassium every other day  . Inflammatory polyps of colon ([place])   . Internal hemorrhoids   . Osteopenia   . Personal history of meningioma of the brain   . 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 Pneumonia    hx of-about 4+ yrs ago  . Seasonal allergies    takes Allegra as needed and Flonase daily  . SVT (supraventricular tachycardia) ([place])   . Tennis elbow   . Ulcerative colitis  On Entyvio q 4 week dosing  . Urinary urgency   . Vitamin B12 deficiency   . Von Willebrands disease [facility])      Past Surgical History:  Procedure Laterality Date  . BAHA REVISION Right 03/15/2016   Procedure: REVISION RIGHT BAHA HEARING IMPLANT ;  Surgeon: Vicie Mutters, MD;  Location: [facility];  Service: ENT;  Laterality: Right;  . BRAIN MENINGIOMA EXCISION  05/2007  . CARDIAC ELECTROPHYSIOLOGY STUDY AND ABLATION  10/22/09  . COLONOSCOPY N/A 07/29/2013   Procedure: COLONOSCOPY;  Surgeon: Lafayette Dragon, MD;  Location: [facility];  Service: Endoscopy;  Laterality: N/A;  . COLONOSCOPY WITH PROPOFOL N/A  09/21/2015   Procedure: COLONOSCOPY WITH PROPOFOL;  Surgeon: Manus Gunning, MD;  Location: [facility];  Service: Gastroenterology;  Laterality: N/A;  . COLONOSCOPY WITH PROPOFOL N/A 10/30/2017   Procedure: COLONOSCOPY WITH PROPOFOL;  Surgeon: Yetta Flock, MD;  Location: [facility];  Service: Gastroenterology;  Laterality: N/A;  . ESOPHAGOGASTRODUODENOSCOPY    . IMPLANTATION BONE ANCHORED HEARING AID Right   . WISDOM TOOTH EXTRACTION     Family History  Problem Relation Age of Onset  . Heart disease Maternal Grandfather   . Heart disease Mother   . Stroke Mother   . Diabetes Maternal Uncle   . Colon cancer Neg Hx   . Breast cancer Neg Hx    Social History   Tobacco Use  . Smoking status: Never Smoker  . Smokeless tobacco: Never Used  Substance Use Topics  . Alcohol use: No    Alcohol/week: 0.0 standard drinks  . Drug use: No   Current Outpatient Medications  Medication Sig Dispense Refill  . Calcium Carbonate-Vit D-Min (CALCIUM 1200) 1200-1000 MG-UNIT CHEW Chew 1 tablet by mouth daily.    Marland Kitchen FLUoxetine (PROZAC) 20 MG capsule Take 20 mg by mouth daily 90 capsule 0  . hydrocortisone 2.5 % ointment Apply 1 application topically 2 (two) times daily as needed (for eczema).    . metoprolol succinate (TOPROL-XL) 50 MG 24 hr tablet Take 1 tablet (50 mg total) by mouth daily. Take with or immediately following a meal. 90 tablet 0  . Multiple Vitamin (MULTIVITAMIN) capsule Take 1 capsule by mouth daily.    Marland Kitchen saccharomyces boulardii (FLORASTOR) 250 MG capsule Take 250 mg by mouth daily.    Marland Kitchen STIMATE 1.5 MG/ML SOLN Place 1 spray into both nostrils See admin instructions. Use 1 spray in each nostril 30-60 minutes prior to procedure  1  . tranexamic acid (LYSTEDA) 650 MG TABS tablet Take 1,300 mg by mouth See admin instructions. Start after procedure, take 1300 mg by mouth 3 times daily for 5 days  2  . vedolizumab (ENTYVIO) 300 MG injection Inject 300 mg into the vein as  directed. Every 4 weeks. 1 vial 6   No current facility-administered medications for this visit.    Allergies  Allergen Reactions  . Atenolol Swelling  . Asa [Aspirin]     Platelet disorder   . Cephalexin Other (See Comments)    Upset her colitis Upset her colitis  . Ibuprofen     Platelet disrder     Review of Systems: All systems reviewed and negative except where noted in HPI.   Recent labs as above  Physical Exam: BP 110/60   Pulse 93   Ht '4\' 11"'  (1.499 m)   Wt 183 lb (83 kg)   LMP 11/26/2008   BMI 36.96 kg/m  Constitutional: Pleasant,well-developed, female in no acute distress. HEENT:  Normocephalic and atraumatic. Conjunctivae are normal. No scleral icterus. Neck supple.  Cardiovascular: Normal rate, regular rhythm.  Pulmonary/chest: Effort normal and breath sounds normal. No wheezing, rales or rhonchi. Abdominal: Soft, nondistended, nontender. There are no masses palpable. No hepatomegaly. Extremities: no edema Lymphadenopathy: No cervical adenopathy noted. Neurological: Alert and oriented to person place and time. Skin: Skin is warm and dry. No rashes noted. Psychiatric: Normal mood and affect. Behavior is normal.   ASSESSMENT AND PLAN: 58 year old female here for reassessment following issues:  Ulcerative colitis - as well as history of C. Difficile, last stool test negative for this. Initially did well with Entyvio, and levels were low with some active symptoms and her dose was increased. Last colonoscopy showed only mild inflammation. Unfortunately since I've last seen her she's had some recurrent symptoms which have since bother her more persistently. Her CRP is elevated at 16. WBC normal and Hgb normal. I'm concerned she may be failing higher dose Entyvio. I discussed options with her. She was on Remicade remotely and had a serious infection. We'll check a fecal calprotectin now to confirm active inflammation after discussion of stool based testing versus  colonoscopy to assess how active her disease truly is right now. In the interim, will resume Lialda 2.4gm / day and will add Rowasa enemas q HS to see if this will help. If she does not respond to these measures she may need a course of steroids and will need to consider switch in regimen from [facility]. She agreed with the plan, she will contact me in one week for an update.    Cellar, MD [facility]

## 2018-09-25 NOTE — Patient Instructions (Addendum)
If you are age 58 or older, your body mass index should be between 23-30. Your Body mass index is 36.96 kg/m. If this is out of the aforementioned range listed, please consider follow up with your Primary Care Provider.  If you are age 104 or younger, your body mass index should be between 19-25. Your Body mass index is 36.96 kg/m. If this is out of the aformentioned range listed, please consider follow up with your Primary Care Provider.   Continue your Lialda.  We have sent the following medications to your pharmacy for you to pick up at your convenience: Rowasa enemas: Use every other night.  Please go to the lab in the basement of our building to have lab work done as you leave today. Hit "B" for basement when you get on the elevator.  When the doors open the lab is on your left.  We will call you with the results. Thank you.  Thank you for entrusting me with your care and for choosing [facility], Dr. Loretto Cellar

## 2018-09-27 ENCOUNTER — Other Ambulatory Visit: Payer: BLUE CROSS/BLUE SHIELD

## 2018-09-27 DIAGNOSIS — K51518 Left sided colitis with other complication: Secondary | ICD-10-CM

## 2018-10-02 ENCOUNTER — Other Ambulatory Visit: Payer: Self-pay

## 2018-10-02 DIAGNOSIS — R197 Diarrhea, unspecified: Secondary | ICD-10-CM

## 2018-10-02 DIAGNOSIS — K51518 Left sided colitis with other complication: Secondary | ICD-10-CM

## 2018-10-02 LAB — CALPROTECTIN, FECAL: Calprotectin, Fecal: 2550 ug/g — ABNORMAL HIGH (ref 0–120)

## 2018-10-02 NOTE — Progress Notes (Unsigned)
Order for Cdiff entered according to result note.

## 2018-10-09 ENCOUNTER — Other Ambulatory Visit: Payer: BLUE CROSS/BLUE SHIELD

## 2018-10-09 DIAGNOSIS — R197 Diarrhea, unspecified: Secondary | ICD-10-CM

## 2018-10-09 DIAGNOSIS — K51919 Ulcerative colitis, unspecified with unspecified complications: Secondary | ICD-10-CM

## 2018-10-09 DIAGNOSIS — K51518 Left sided colitis with other complication: Secondary | ICD-10-CM

## 2018-10-09 NOTE — Addendum Note (Signed)
Addended by: Boris Lown B on: 10/09/2018 04:27 PM   Modules accepted: Orders

## 2018-10-11 LAB — CLOSTRIDIUM DIFFICILE TOXIN B, QUALITATIVE, REAL-TIME PCR: Toxigenic C. Difficile by PCR: NOT DETECTED

## 2018-10-16 ENCOUNTER — Telehealth: Payer: Self-pay | Admitting: Gastroenterology

## 2018-10-16 DIAGNOSIS — K51518 Left sided colitis with other complication: Secondary | ICD-10-CM

## 2018-10-16 NOTE — Telephone Encounter (Signed)
Called patient. Fecal calprotectin > 2000, C Diff negative. She has failed high dose Entyvio. Actually doing better on Rowasa but don't think this will hold her. I had a lengthy discussion about her options. Ultimately recommend combination therapy with anti-TNF and thiopurines - discussed risks / benefits and she wanted to proceed. She wants to hold off on steroids right now as she thinks she can wait until Humira and feeling better.   Sheri - can you see if the patient can get approved for Humira, I would like to start her on standard dosing. Also, she will need to go to the lab for hepatitis B surface antigen, hepatitis B surface antibody, and TPMT enzyme assay if you can order that.   Thanks, FYI this patient is [name]'s mother

## 2018-10-16 NOTE — Telephone Encounter (Signed)
I called the patient to discuss options given stool test results. No answer. Will call back

## 2018-10-17 MED ORDER — ADALIMUMAB 40 MG/0.4ML ~~LOC~~ AJKT: 40.0000 mg | AUTO-INJECTOR | SUBCUTANEOUS | 6 refills | Status: DC

## 2018-10-17 MED ORDER — ADALIMUMAB 80 MG/0.8ML ~~LOC~~ AJKT: 160.0000 mg | AUTO-INJECTOR | Freq: Once | SUBCUTANEOUS | 0 refills | Status: DC

## 2018-10-17 NOTE — Addendum Note (Signed)
Addended by: Marlon Pel on: 10/17/2018 09:51 AM   Modules accepted: Orders

## 2018-10-17 NOTE — Telephone Encounter (Signed)
Patient notified She will come for labs on Monday rx sent for Humira.  She understands that it may take a week or two to get sorted out out her insurance.

## 2018-10-17 NOTE — Addendum Note (Signed)
Addended by: Marlon Pel on: 10/17/2018 03:51 PM   Modules accepted: Orders

## 2018-10-17 NOTE — Telephone Encounter (Signed)
Rx sent to [facility] specialty pharmacy

## 2018-10-21 ENCOUNTER — Other Ambulatory Visit: Payer: BLUE CROSS/BLUE SHIELD

## 2018-10-21 DIAGNOSIS — K51518 Left sided colitis with other complication: Secondary | ICD-10-CM

## 2018-10-23 ENCOUNTER — Other Ambulatory Visit: Payer: Self-pay

## 2018-10-23 DIAGNOSIS — K51518 Left sided colitis with other complication: Secondary | ICD-10-CM

## 2018-10-23 NOTE — Progress Notes (Signed)
Per result note, Order Hep B core antibody

## 2018-10-28 ENCOUNTER — Other Ambulatory Visit: Payer: BLUE CROSS/BLUE SHIELD

## 2018-10-28 DIAGNOSIS — K51518 Left sided colitis with other complication: Secondary | ICD-10-CM

## 2018-10-28 LAB — HEPATITIS B SURFACE ANTIBODY, QUANTITATIVE: Hep B S AB Quant (Post): <5 m[IU]/mL — ABNORMAL LOW (ref >=10)

## 2018-10-28 LAB — THIOPURINE METHYLTRANSFERASE (TPMT), RBC: Thiopurine Methyltransferase, RBC: 14 nmol/hr/mL RBC

## 2018-10-28 LAB — HEPATITIS B SURFACE ANTIGEN: Hepatitis B Surface Ag: NONREACTIVE

## 2018-10-28 MED ORDER — MERCAPTOPURINE 50 MG PO TABS: 100.0000 mg | ORAL_TABLET | Freq: Every day | ORAL | 5 refills | Status: DC

## 2018-10-29 LAB — HEPATITIS B CORE ANTIBODY, TOTAL: Hep B Core Total Ab: NONREACTIVE

## 2018-10-30 ENCOUNTER — Other Ambulatory Visit: Payer: Self-pay

## 2018-10-30 NOTE — Progress Notes (Signed)
Order for Hep B series placed.  Pt has 1st appt for Wed, 3-18 at 9:30am

## 2018-11-18 ENCOUNTER — Other Ambulatory Visit: Payer: BLUE CROSS/BLUE SHIELD

## 2018-11-18 ENCOUNTER — Other Ambulatory Visit (INDEPENDENT_AMBULATORY_CARE_PROVIDER_SITE_OTHER): Payer: BLUE CROSS/BLUE SHIELD

## 2018-11-18 ENCOUNTER — Telehealth: Payer: Self-pay | Admitting: Gastroenterology

## 2018-11-18 DIAGNOSIS — K51518 Left sided colitis with other complication: Secondary | ICD-10-CM | POA: Diagnosis not present

## 2018-11-18 LAB — HEPATIC FUNCTION PANEL
ALT: 10 U/L (ref 0–35)
AST: 11 U/L (ref 0–37)
Albumin: 4 g/dL (ref 3.5–5.2)
Alkaline Phosphatase: 51 U/L (ref 39–117)
Bilirubin, Direct: 0 mg/dL (ref 0.0–0.3)
Total Bilirubin: 0.4 mg/dL (ref 0.2–1.2)
Total Protein: 7.4 g/dL (ref 6.0–8.3)

## 2018-11-18 NOTE — Telephone Encounter (Signed)
Pt call wanting to speak with the nurse regarding medication

## 2018-11-18 NOTE — Telephone Encounter (Signed)
Pt was trying to give herself the Humira 15m dose of the starter kit and had a misfire on 11/15/18. Pt spoke with her nurse ambassador and was told to have the office call [facility] to try and expedite the replacement order. Ref# [id number] [facility] number [id number]

## 2018-11-19 NOTE — Telephone Encounter (Signed)
Form for replacement pen faxed to [facility].

## 2018-11-20 ENCOUNTER — Telehealth: Payer: Self-pay | Admitting: Gastroenterology

## 2018-11-20 ENCOUNTER — Other Ambulatory Visit: Payer: BLUE CROSS/BLUE SHIELD

## 2018-11-20 LAB — CBC WITH DIFFERENTIAL/PLATELET
Basophils Absolute: 0.1 10*3/uL (ref 0.0–0.1)
Basophils Relative: 0.9 % (ref 0.0–3.0)
Eosinophils Absolute: 0.2 10*3/uL (ref 0.0–0.7)
Eosinophils Relative: 3.2 % (ref 0.0–5.0)
HCT: 38.9 % (ref 36.0–46.0)
Hemoglobin: 12.3 g/dL (ref 12.0–15.0)
Lymphocytes Relative: 34.6 % (ref 12.0–46.0)
Lymphs Abs: 2.6 10*3/uL (ref 0.7–4.0)
MCHC: 31.6 g/dL (ref 30.0–36.0)
MCV: 86 fl (ref 78.0–100.0)
Monocytes Absolute: 0.5 10*3/uL (ref 0.1–1.0)
Monocytes Relative: 7.1 % (ref 3.0–12.0)
Neutro Abs: 4.1 10*3/uL (ref 1.4–7.7)
Neutrophils Relative %: 54.2 % (ref 43.0–77.0)
Platelets: 336 10*3/uL (ref 150.0–400.0)
RBC: 4.52 Mil/uL (ref 3.87–5.11)
RDW: 14.1 % (ref 11.5–15.5)
WBC: 7.5 10*3/uL (ref 4.0–10.5)

## 2018-11-20 NOTE — Telephone Encounter (Signed)
Pt will have labs redrawn today, she wanted to know if she needed to do anything different due to previous sample clotting. Let her know she did not need to do anything different.

## 2018-11-21 ENCOUNTER — Other Ambulatory Visit: Payer: Self-pay | Admitting: Gastroenterology

## 2018-11-21 DIAGNOSIS — K51919 Ulcerative colitis, unspecified with unspecified complications: Secondary | ICD-10-CM

## 2018-12-02 ENCOUNTER — Telehealth: Payer: Self-pay | Admitting: Gastroenterology

## 2018-12-02 NOTE — Telephone Encounter (Signed)
Called Pharmacy because patient has refills left. They made a mistake and do not need any action from [place].

## 2018-12-02 NOTE — Telephone Encounter (Signed)
Varney Biles from [facility] [facility] called to have Humira prescription sent over.

## 2018-12-23 ENCOUNTER — Other Ambulatory Visit (INDEPENDENT_AMBULATORY_CARE_PROVIDER_SITE_OTHER): Payer: BLUE CROSS/BLUE SHIELD

## 2018-12-23 DIAGNOSIS — K51919 Ulcerative colitis, unspecified with unspecified complications: Secondary | ICD-10-CM | POA: Diagnosis not present

## 2018-12-23 LAB — CBC WITH DIFFERENTIAL/PLATELET
Basophils Absolute: 0 10*3/uL (ref 0.0–0.1)
Basophils Relative: 0.3 % (ref 0.0–3.0)
Eosinophils Absolute: 0.1 10*3/uL (ref 0.0–0.7)
Eosinophils Relative: 2 % (ref 0.0–5.0)
HCT: 35.9 % — ABNORMAL LOW (ref 36.0–46.0)
Hemoglobin: 11.7 g/dL — ABNORMAL LOW (ref 12.0–15.0)
Lymphocytes Relative: 48.4 % — ABNORMAL HIGH (ref 12.0–46.0)
Lymphs Abs: 2 10*3/uL (ref 0.7–4.0)
MCHC: 32.6 g/dL (ref 30.0–36.0)
MCV: 89.5 fl (ref 78.0–100.0)
Monocytes Absolute: 0.5 10*3/uL (ref 0.1–1.0)
Monocytes Relative: 11.6 % (ref 3.0–12.0)
Neutro Abs: 1.6 10*3/uL (ref 1.4–7.7)
Neutrophils Relative %: 37.7 % — ABNORMAL LOW (ref 43.0–77.0)
Platelets: 371 10*3/uL (ref 150.0–400.0)
RBC: 4.01 Mil/uL (ref 3.87–5.11)
RDW: 22.3 % — ABNORMAL HIGH (ref 11.5–15.5)
WBC: 4.2 10*3/uL (ref 4.0–10.5)

## 2018-12-23 LAB — HEPATIC FUNCTION PANEL
ALT: 35 U/L (ref 0–35)
AST: 19 U/L (ref 0–37)
Albumin: 3.7 g/dL (ref 3.5–5.2)
Alkaline Phosphatase: 42 U/L (ref 39–117)
Bilirubin, Direct: 0.1 mg/dL (ref 0.0–0.3)
Total Bilirubin: 0.5 mg/dL (ref 0.2–1.2)
Total Protein: 7 g/dL (ref 6.0–8.3)

## 2018-12-26 ENCOUNTER — Other Ambulatory Visit: Payer: Self-pay

## 2018-12-26 ENCOUNTER — Other Ambulatory Visit: Payer: BLUE CROSS/BLUE SHIELD

## 2018-12-26 ENCOUNTER — Encounter: Payer: Self-pay | Admitting: Gastroenterology

## 2018-12-26 ENCOUNTER — Ambulatory Visit (INDEPENDENT_AMBULATORY_CARE_PROVIDER_SITE_OTHER): Payer: BLUE CROSS/BLUE SHIELD | Admitting: Gastroenterology

## 2018-12-26 VITALS — Ht 59.0 in | Wt 173.0 lb

## 2018-12-26 DIAGNOSIS — Z79899 Other long term (current) drug therapy: Secondary | ICD-10-CM

## 2018-12-26 DIAGNOSIS — K51318 Ulcerative (chronic) rectosigmoiditis with other complication: Secondary | ICD-10-CM

## 2018-12-26 MED ORDER — ONDANSETRON 4 MG PO TBDP: 4.0000 mg | ORAL_TABLET | Freq: Four times a day (QID) | ORAL | 3 refills | Status: DC | PRN

## 2018-12-26 NOTE — Patient Instructions (Signed)
If you are age 58 or older, your body mass index should be between 23-30. Your Body mass index is 34.94 kg/m. If this is out of the aforementioned range listed, please consider follow up with your Primary Care Provider.  If you are age 17 or younger, your body mass index should be between 19-25. Your Body mass index is 34.94 kg/m. If this is out of the aformentioned range listed, please consider follow up with your Primary Care Provider.   To help prevent the possible spread of infection to our patients, communities, and staff; we will be implementing the following measures:  As of now we are not allowing any visitors/family members to accompany you to any upcoming appointments with [facility]. If you have any concerns about this please contact our office to discuss prior to the appointment.   We have sent the following medications to your pharmacy for you to pick up at your convenience: Zofran 4mg  Orally Dissolving Tablet: Take every 6 hours as needed  Continue 6MP.  Discontinue mesalamine tablets and enemas  Discontinue Humira.  Please go to the lab for a C Diff test.  You are in need of a colonoscopy.  We will let you know when we have availability at the hospital and can get you scheduled.  Thank you for entrusting me with your care and for choosing [facility], Dr. [place] Cellar

## 2018-12-26 NOTE — H&P (View-Only) (Signed)
Virtual Visit via Video Note  I connected with Lauren [name] on 12/26/18 at 11:30 AM EDT by a video enabled telemedicine application and verified that I am speaking with the correct person using two identifiers.  I discussed the limitations of evaluation and management by telemedicine and the availability of in person appointments. The patient expressed understanding and agreed to proceed.  THIS ENCOUNTER IS A VIRTUAL VISIT DUE TO COVID-19 - PATIENT WAS NOT SEEN IN THE OFFICE. PATIENT HAS CONSENTED TO VIRTUAL VISIT / TELEMEDICINE VISIT USING [employer] APP   Location of patient: home Location of provider: office Persons participating: myself, patient    HPI :  Colitis History Left sided ulcerative colitis, diagnosed > 15years ago. On Remicade in the past for her colitis, which she thinks she did well for a few years. She had a severe MRSA infection of her ear and ultimately Remicade was held and eventually was stopped given she had done well when it was held. She has been on Imuran in the past, she tolerated it well, she thinks it was weaned off given she had been feeling well also. She has never been on Humira or other anti-TNF agents. She has never been on methotrexate. She has been on Lialda for a long time, prior to that Asacol. She has Von Willebrands. She bleeds easily but has never had significant bleeding / hemorrhages in the past. She has had DDAVP infusions prior to her last colonoscopy or Stilmate nose spray. Started on [facility] April 2018 following a few flares that Spring.Failed Entyvio 2019, started Humira and 6MP Feb / March 2020.    SINCE LAST VISIT:  58 year old female here for a follow-up visit.   At the last visit she had a markedly elevated fecal calprotectin to 2550 while on high dose Entyvio and active symptoms, and was C diff  Negative. We transitioned her to 6MP 156m / day in addition to Humira. She has had about 4 doses of Humira thus far over the past several  weeks. Her lase dose was last Thursday. She is also taking the 6MP 1066m/ day, as well as Lialda. She feels she continues to have diarrhea and loose stools. She has been experiencing nocturnal diarrhea. She does not think it has helped at all since being on this regimen. She tolerates the Humira injection and the 6MP. She is having 10 BMs per day or so, which she thinks has been stable over recent weeks. She is having some blood in the stools as well which is bothering her. Hemorrhoids have been bothering her recently. Loss of appetite and some nausea has been bothering her since being on 6MP. She is not having abdominal pain, only urgency which is bothering her.   She has had some intermittent visual change, vision can seem blurry at times when reading. She has had some twitching of her extremities which occur sporadically, she felt this a few times yesterday, ongoing for a few weeks so far since she started the Humira. She thinks getting worse over time. She is feeling run down and tired, sleeping a lot. No headaches. No weakness or parasthesias otherwise.   She does not think Lialda has helped her much in the past. Recent blood work looked okay, no significant anemia or leukocytosis.  TB testing negative 05/21/18 PCV 13 04/2018 Flu shot UTD  Endoscopic history: Colonoscopy 2014 - active rectal inflammation, inflammatory polyp Colonoscopy 09/21/2015 - overall good control of colitis with small mild area of inflammation in left  colon, mild chronic colitis Colonoscopy 10/30/2017 - normal ileum, 10-15cm segment of mild inflammation, otherwise normal - mildly active colitis   Past Medical History:  Diagnosis Date   Anemia    Cellulitis of left breast 09/17/2015   diagnosed at Urgent Care- on ABX   Clostridium difficile diarrhea    Clotting disorder ([place])    Depression    takes Prozac daily   Hearing loss    right ear   History of colon polyps    benign   Hypertension    takes  Metoprolol daily   Hypokalemia    takes Potassium every other day   Inflammatory polyps of colon ([place])    Internal hemorrhoids    Osteopenia    Personal history of meningioma of the brain    Platelet disorder ([place])    Pneumonia    hx of-about 4+ yrs ago   Seasonal allergies    takes Allegra as needed and Flonase daily   SVT (supraventricular tachycardia) (H[facility])    Tennis elbow    Ulcerative colitis    On Entyvio q 4 week dosing   Urinary urgency    Vitamin B12 deficiency    Von Willebrands disease ([place])      Past Surgical History:  Procedure Laterality Date   BAHA REVISION Right 03/15/2016   Procedure: REVISION RIGHT BAHA HEARING IMPLANT ;  Surgeon: Vicie Mutters, MD;  Location: [place];  Service: ENT;  Laterality: Right;   BRAIN MENINGIOMA EXCISION  05/2007   CARDIAC ELECTROPHYSIOLOGY STUDY AND ABLATION  10/22/09   COLONOSCOPY N/A 07/29/2013   Procedure: COLONOSCOPY;  Surgeon: Lafayette Dragon, MD;  Location: [facility];  Service: Endoscopy;  Laterality: N/A;   COLONOSCOPY WITH PROPOFOL N/A 09/21/2015   Procedure: COLONOSCOPY WITH PROPOFOL;  Surgeon: Manus Gunning, MD;  Location: [facility];  Service: Gastroenterology;  Laterality: N/A;   COLONOSCOPY WITH PROPOFOL N/A 10/30/2017   Procedure: COLONOSCOPY WITH PROPOFOL;  Surgeon: Yetta Flock, MD;  Location: [facility];  Service: Gastroenterology;  Laterality: N/A;   ESOPHAGOGASTRODUODENOSCOPY     IMPLANTATION BONE ANCHORED HEARING AID Right    WISDOM TOOTH EXTRACTION     Family History  Problem Relation Age of Onset   Heart disease Maternal Grandfather    Heart disease Mother    Stroke Mother    Diabetes Maternal Uncle    Colon cancer Neg Hx    Breast cancer Neg Hx    Social History   Tobacco Use   Smoking status: Never Smoker   Smokeless tobacco: Never Used  Substance Use Topics   Alcohol use: No    Alcohol/week: 0.0 standard drinks   Drug use: No   Current Outpatient  Medications  Medication Sig Dispense Refill   Adalimumab (HUMIRA PEN) 40 MG/0.4ML PNKT Inject 40 mg into the skin every 14 (fourteen) days. 2 each 6   FLUoxetine (PROZAC) 20 MG capsule Take 20 mg by mouth daily 90 capsule 0   hydrocortisone 2.5 % ointment Apply 1 application topically 2 (two) times daily as needed (for eczema).     mercaptopurine (PURINETHOL) 50 MG tablet Take 2 tablets (100 mg total) by mouth daily. Give on an empty stomach 1 hour before or 2 hours after meals. Caution: Chemotherapy. 60 tablet 5   mesalamine (LIALDA) 1.2 g EC tablet Take 2.4 g by mouth daily with breakfast.     metoprolol succinate (TOPROL-XL) 50 MG 24 hr tablet Take 1 tablet (50 mg total) by mouth daily. Take with  or immediately following a meal. 90 tablet 0   Multiple Vitamin (MULTIVITAMIN) capsule Take 1 capsule by mouth daily.     saccharomyces boulardii (FLORASTOR) 250 MG capsule Take 250 mg by mouth daily.     STIMATE 1.5 MG/ML SOLN Place 1 spray into both nostrils See admin instructions. Use 1 spray in each nostril 30-60 minutes prior to procedure  1   tranexamic acid (LYSTEDA) 650 MG TABS tablet Take 1,300 mg by mouth See admin instructions. Start after procedure, take 1300 mg by mouth 3 times daily for 5 days  2   Adalimumab (HUMIRA PEN-CD/UC/HS STARTER) 80 MG/0.8ML PNKT Inject 160 mg into the skin once for 1 dose. On day 0, then inject 80 mg on day 14 3 each 0   mesalamine (ROWASA) 4 g enema Place 60 mLs (4 g total) rectally every other day. (Patient not taking: Reported on 12/26/2018) 1800 mL 1   No current facility-administered medications for this visit.    Allergies  Allergen Reactions   Atenolol Swelling   Asa [Aspirin]     Platelet disorder    Cephalexin Other (See Comments)    Upset her colitis Upset her colitis   Ibuprofen     Platelet disrder     Review of Systems: All systems reviewed and negative except where noted in HPI.   Lab Results  Component Value Date    WBC 4.2 12/23/2018   HGB 11.7 (L) 12/23/2018   HCT 35.9 (L) 12/23/2018   MCV 89.5 12/23/2018   PLT 371.0 12/23/2018    Lab Results  Component Value Date   CREATININE 0.71 06/27/2018   BUN 13 06/27/2018   NA 144 06/27/2018   K 3.7 06/27/2018   CL 103 06/27/2018   CO2 24 06/27/2018    Lab Results  Component Value Date   ALT 35 12/23/2018   AST 19 12/23/2018   ALKPHOS 42 12/23/2018   BILITOT 0.5 12/23/2018     Physical Exam: Ht 4' 11"  (1.499 m)    Wt 173 lb (78.5 kg)    LMP 11/26/2008    BMI 34.94 kg/m  NA  ASSESSMENT AND PLAN:  58 y/o female here for reassessment of the following issues:  Left sided UC / high risk medication - patient with left sided UC and a history of recurrent C diff. History as above, most recently failed high dose Entyvio, fecal calprotectin > 2500 on this regimen, stopped it and transitioned to Humira and 6MP recently. Unfortunately after 4 doses of Humira she does not feel any better, has had persistent symptoms and urgency with loose stools and some bleeding. Now also with some transient neurologic symptoms as well. With the neurologic symptoms, I'm concerned she could be having a reaction to the Humira and recommend stopping it right now. Unclear if she is a primary nonresponder to this regimen thus far, it is a bit premature to say that, but I'm concerned about it, and also need to rule out recurrent C diff in light of this given her history of it. In addition, she never stopped the Lialda, and a paradoxical reaction to mesalamine is possible although she has not appeared to have had that in the past, I think reasonable to hold it for now. I think she can continue 6MP for now, she seems to be tolerating it other than some nausea. Moving forward, recommend colonoscopy at this time to clarify how much inflammation is active and what is driving her symptoms. Pending her C Diff  is negative, will plan on proceeding with colonoscopy in the near future.  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 She will call her hematologist to clarify her regimen of what she needs to take (stimate?) to prevent bleeding around the procedure time. Further, her colonoscopy needs to be done at the hospital due to her bleeding risk (she has had major bleeding from 2 separate surgeries in the past, none from colonoscopy). She is much more comfortable to have the colonoscopy at the hospital due to bleeding perspective.  PLAN: - C Diff PCR today, if negative, will proceed with colonoscopy in the near future at hospital - if C diff PCR positive will treat, if negative will give course of Uceris (she wishes to avoid prednisone if at all possible) - hold Humira for now - continue 6MP - stop Lialda - get recommendations from Hematologist about recommendations to treat platelet disorder to minimize bleeding risk at time of colonoscopy - colonoscopy to be done at the hospital in the near future, awaiting C Diff prior to scheduling - if she does have significantly active colitis, we may need to move on from Humira and try another regimen, especially if she is having some side effect from it. Consideration for Stelara / Morrie Sheldon - I counseled her that if her neurologic symptoms worsen / persist, or her colitis symptoms worsen while awaiting results of the C Diff test, to contact me for further instruction.    Cellar, MD [facility]

## 2018-12-26 NOTE — Progress Notes (Addendum)
Virtual Visit via Video Note  I connected with Lauren [name] on 12/26/18 at 11:30 AM EDT by a video enabled telemedicine application and verified that I am speaking with the correct person using two identifiers.  I discussed the limitations of evaluation and management by telemedicine and the availability of in person appointments. The patient expressed understanding and agreed to proceed.  THIS ENCOUNTER IS A VIRTUAL VISIT DUE TO COVID-19 - PATIENT WAS NOT SEEN IN THE OFFICE. PATIENT HAS CONSENTED TO VIRTUAL VISIT / TELEMEDICINE VISIT USING DOXIMITY APP   Location of patient: home Location of provider: office Persons participating: myself, patient    HPI :  Colitis History Left sided ulcerative colitis, diagnosed > 15years ago. On Remicade in the past for her colitis, which she thinks she did well for a few years. She had a severe MRSA infection of her ear and ultimately Remicade was held and eventually was stopped given she had done well when it was held. She has been on Imuran in the past, she tolerated it well, she thinks it was weaned off given she had been feeling well also. She has never been on Humira or other anti-TNF agents. She has never been on methotrexate. She has been on Lialda for a long time, prior to that Asacol. She has Von Willebrands. She bleeds easily but has never had significant bleeding / hemorrhages in the past. She has had DDAVP infusions prior to her last colonoscopy or Stilmate nose spray. Started on [facility] April 2018 following a few flares that Spring.Failed Entyvio 2019, started Humira and 6MP Feb / March 2020.    SINCE LAST VISIT:  58 year old female here for a follow-up visit.   At the last visit she had a markedly elevated fecal calprotectin to 2550 while on high dose Entyvio and active symptoms, and was C diff  Negative. We transitioned her to 6MP 11m / day in addition to Humira. She has had about 4 doses of Humira thus far over the past several  weeks. Her lase dose was last Thursday. She is also taking the 6MP 1041m/ day, as well as Lialda. She feels she continues to have diarrhea and loose stools. She has been experiencing nocturnal diarrhea. She does not think it has helped at all since being on this regimen. She tolerates the Humira injection and the 6MP. She is having 10 BMs per day or so, which she thinks has been stable over recent weeks. She is having some blood in the stools as well which is bothering her. Hemorrhoids have been bothering her recently. Loss of appetite and some nausea has been bothering her since being on 6MP. She is not having abdominal pain, only urgency which is bothering her.   She has had some intermittent visual change, vision can seem blurry at times when reading. She has had some twitching of her extremities which occur sporadically, she felt this a few times yesterday, ongoing for a few weeks so far since she started the Humira. She thinks getting worse over time. She is feeling run down and tired, sleeping a lot. No headaches. No weakness or parasthesias otherwise.   She does not think Lialda has helped her much in the past. Recent blood work looked okay, no significant anemia or leukocytosis.  TB testing negative 05/21/18 PCV 13 04/2018 Flu shot UTD  Endoscopic history: Colonoscopy 2014 - active rectal inflammation, inflammatory polyp Colonoscopy 09/21/2015 - overall good control of colitis with small mild area of inflammation in left  colon, mild chronic colitis Colonoscopy 10/30/2017 - normal ileum, 10-15cm segment of mild inflammation, otherwise normal - mildly active colitis   Past Medical History:  Diagnosis Date   Anemia    Cellulitis of left breast 09/17/2015   diagnosed at Urgent Care- on ABX   Clostridium difficile diarrhea    Clotting disorder ([place])    Depression    takes Prozac daily   Hearing loss    right ear   History of colon polyps    benign   Hypertension    takes  Metoprolol daily   Hypokalemia    takes Potassium every other day   Inflammatory polyps of colon ([place])    Internal hemorrhoids    Osteopenia    Personal history of meningioma of the brain    Platelet disorder ([place])    Pneumonia    hx of-about 4+ yrs ago   Seasonal allergies    takes Allegra as needed and Flonase daily   SVT (supraventricular tachycardia) ([facility])    Tennis elbow    Ulcerative colitis    On Entyvio q 4 week dosing   Urinary urgency    Vitamin B12 deficiency    Von Willebrands disease ([place])      Past Surgical History:  Procedure Laterality Date   BAHA REVISION Right 03/15/2016   Procedure: REVISION RIGHT BAHA HEARING IMPLANT ;  Surgeon: Vicie Mutters, MD;  Location: [place];  Service: ENT;  Laterality: Right;   BRAIN MENINGIOMA EXCISION  05/2007   CARDIAC ELECTROPHYSIOLOGY STUDY AND ABLATION  10/22/09   COLONOSCOPY N/A 07/29/2013   Procedure: COLONOSCOPY;  Surgeon: Lafayette Dragon, MD;  Location: [facility];  Service: Endoscopy;  Laterality: N/A;   COLONOSCOPY WITH PROPOFOL N/A 09/21/2015   Procedure: COLONOSCOPY WITH PROPOFOL;  Surgeon: Manus Gunning, MD;  Location: [facility];  Service: Gastroenterology;  Laterality: N/A;   COLONOSCOPY WITH PROPOFOL N/A 10/30/2017   Procedure: COLONOSCOPY WITH PROPOFOL;  Surgeon: Yetta Flock, MD;  Location: [facility];  Service: Gastroenterology;  Laterality: N/A;   ESOPHAGOGASTRODUODENOSCOPY     IMPLANTATION BONE ANCHORED HEARING AID Right    WISDOM TOOTH EXTRACTION     Family History  Problem Relation Age of Onset   Heart disease Maternal Grandfather    Heart disease Mother    Stroke Mother    Diabetes Maternal Uncle    Colon cancer Neg Hx    Breast cancer Neg Hx    Social History   Tobacco Use   Smoking status: Never Smoker   Smokeless tobacco: Never Used  Substance Use Topics   Alcohol use: No    Alcohol/week: 0.0 standard drinks   Drug use: No   Current Outpatient  Medications  Medication Sig Dispense Refill   Adalimumab (HUMIRA PEN) 40 MG/0.4ML PNKT Inject 40 mg into the skin every 14 (fourteen) days. 2 each 6   FLUoxetine (PROZAC) 20 MG capsule Take 20 mg by mouth daily 90 capsule 0   hydrocortisone 2.5 % ointment Apply 1 application topically 2 (two) times daily as needed (for eczema).     mercaptopurine (PURINETHOL) 50 MG tablet Take 2 tablets (100 mg total) by mouth daily. Give on an empty stomach 1 hour before or 2 hours after meals. Caution: Chemotherapy. 60 tablet 5   mesalamine (LIALDA) 1.2 g EC tablet Take 2.4 g by mouth daily with breakfast.     metoprolol succinate (TOPROL-XL) 50 MG 24 hr tablet Take 1 tablet (50 mg total) by mouth daily. Take with  or immediately following a meal. 90 tablet 0   Multiple Vitamin (MULTIVITAMIN) capsule Take 1 capsule by mouth daily.     saccharomyces boulardii (FLORASTOR) 250 MG capsule Take 250 mg by mouth daily.     STIMATE 1.5 MG/ML SOLN Place 1 spray into both nostrils See admin instructions. Use 1 spray in each nostril 30-60 minutes prior to procedure  1   tranexamic acid (LYSTEDA) 650 MG TABS tablet Take 1,300 mg by mouth See admin instructions. Start after procedure, take 1300 mg by mouth 3 times daily for 5 days  2   Adalimumab (HUMIRA PEN-CD/UC/HS STARTER) 80 MG/0.8ML PNKT Inject 160 mg into the skin once for 1 dose. On day 0, then inject 80 mg on day 14 3 each 0   mesalamine (ROWASA) 4 g enema Place 60 mLs (4 g total) rectally every other day. (Patient not taking: Reported on 12/26/2018) 1800 mL 1   No current facility-administered medications for this visit.    Allergies  Allergen Reactions   Atenolol Swelling   Asa [Aspirin]     Platelet disorder    Cephalexin Other (See Comments)    Upset her colitis Upset her colitis   Ibuprofen     Platelet disrder     Review of Systems: All systems reviewed and negative except where noted in HPI.   Lab Results  Component Value Date    WBC 4.2 12/23/2018   HGB 11.7 (L) 12/23/2018   HCT 35.9 (L) 12/23/2018   MCV 89.5 12/23/2018   PLT 371.0 12/23/2018    Lab Results  Component Value Date   CREATININE 0.71 06/27/2018   BUN 13 06/27/2018   NA 144 06/27/2018   K 3.7 06/27/2018   CL 103 06/27/2018   CO2 24 06/27/2018    Lab Results  Component Value Date   ALT 35 12/23/2018   AST 19 12/23/2018   ALKPHOS 42 12/23/2018   BILITOT 0.5 12/23/2018     Physical Exam: Ht 4' 11"  (1.499 m)    Wt 173 lb (78.5 kg)    LMP 11/26/2008    BMI 34.94 kg/m  NA  ASSESSMENT AND PLAN:  58 y/o female here for reassessment of the following issues:  Left sided UC / high risk medication - patient with left sided UC and a history of recurrent C diff. History as above, most recently failed high dose Entyvio, fecal calprotectin > 2500 on this regimen, stopped it and transitioned to Humira and 6MP recently. Unfortunately after 4 doses of Humira she does not feel any better, has had persistent symptoms and urgency with loose stools and some bleeding. Now also with some transient neurologic symptoms as well. With the neurologic symptoms, I'm concerned she could be having a reaction to the Humira and recommend stopping it right now. Unclear if she is a primary nonresponder to this regimen thus far, it is a bit premature to say that, but I'm concerned about it, and also need to rule out recurrent C diff in light of this given her history of it. In addition, she never stopped the Lialda, and a paradoxical reaction to mesalamine is possible although she has not appeared to have had that in the past, I think reasonable to hold it for now. I think she can continue 6MP for now, she seems to be tolerating it other than some nausea. Moving forward, recommend colonoscopy at this time to clarify how much inflammation is active and what is driving her symptoms. Pending her C Diff  is negative, will plan on proceeding with colonoscopy in the near future.  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 She will call her hematologist to clarify her regimen of what she needs to take (stimate?) to prevent bleeding around the procedure time. Further, her colonoscopy needs to be done at the hospital due to her bleeding risk (she has had major bleeding from 2 separate surgeries in the past, none from colonoscopy). She is much more comfortable to have the colonoscopy at the hospital due to bleeding perspective.  PLAN: - C Diff PCR today, if negative, will proceed with colonoscopy in the near future at hospital - if C diff PCR positive will treat, if negative will give course of Uceris (she wishes to avoid prednisone if at all possible) - hold Humira for now - continue 6MP - stop Lialda - get recommendations from Hematologist about recommendations to treat platelet disorder to minimize bleeding risk at time of colonoscopy - colonoscopy to be done at the hospital in the near future, awaiting C Diff prior to scheduling - if she does have significantly active colitis, we may need to move on from Humira and try another regimen, especially if she is having some side effect from it. Consideration for Stelara / Morrie Sheldon - I counseled her that if her neurologic symptoms worsen / persist, or her colitis symptoms worsen while awaiting results of the C Diff test, to contact me for further instruction.   [place] Cellar, MD [facility]

## 2018-12-27 ENCOUNTER — Other Ambulatory Visit: Payer: BLUE CROSS/BLUE SHIELD

## 2018-12-27 DIAGNOSIS — K51318 Ulcerative (chronic) rectosigmoiditis with other complication: Secondary | ICD-10-CM

## 2019-01-02 ENCOUNTER — Telehealth: Payer: Self-pay

## 2019-01-02 ENCOUNTER — Other Ambulatory Visit: Payer: Self-pay

## 2019-01-02 ENCOUNTER — Other Ambulatory Visit: Payer: BLUE CROSS/BLUE SHIELD

## 2019-01-02 ENCOUNTER — Telehealth: Payer: Self-pay | Admitting: Gastroenterology

## 2019-01-02 DIAGNOSIS — R197 Diarrhea, unspecified: Secondary | ICD-10-CM

## 2019-01-02 NOTE — Telephone Encounter (Signed)
Called patient, she wanted to let us know she was downstairs in the lab and had been able to give another sample for C-Diff

## 2019-01-02 NOTE — Telephone Encounter (Signed)
-----   Message from Yetta Flock, MD sent at 01/01/2019  7:42 AM EDT ----- Geri [place] we checked a C Diff for [name], it says collected on May 1 but I still don't see any results, can you look into this and see what happened? Thanks ----- Message ----- From: SYSTEM Sent: 01/01/2019  12:04 AM EDT To: Yetta Flock, MD

## 2019-01-02 NOTE — Progress Notes (Signed)
Lab downstairs said they sent lab out to [place] but [facility] said they did not receive the sample.  New Order entered. Pt notified and may try to come today to submit another samples.

## 2019-01-02 NOTE — Telephone Encounter (Signed)
Lab downstairs said they sent lab out to [place] but [facility] said they did not receive the sample.  New Order entered. Pt notified and may try to come today to submit another samples

## 2019-01-02 NOTE — Telephone Encounter (Signed)
Patient requesting to speak with Sherlynn Stalls to discuss possible results from lab work.

## 2019-01-03 LAB — CLOSTRIDIUM DIFFICILE BY PCR: Toxigenic C. Difficile by PCR: POSITIVE — AB

## 2019-01-03 LAB — CLOSTRIDIUM DIFFICILE TOXIN B, QUALITATIVE, REAL-TIME PCR: Toxigenic C. Difficile by PCR: NOT DETECTED

## 2019-01-05 ENCOUNTER — Other Ambulatory Visit: Payer: Self-pay | Admitting: Gastroenterology

## 2019-01-05 DIAGNOSIS — A0472 Enterocolitis due to Clostridium difficile, not specified as recurrent: Secondary | ICD-10-CM

## 2019-01-05 MED ORDER — VANCOMYCIN HCL 125 MG PO CAPS: 125.0000 mg | ORAL_CAPSULE | Freq: Four times a day (QID) | ORAL | 0 refills | Status: DC

## 2019-01-13 ENCOUNTER — Telehealth: Payer: Self-pay

## 2019-01-13 ENCOUNTER — Other Ambulatory Visit: Payer: Self-pay

## 2019-01-13 DIAGNOSIS — K51919 Ulcerative colitis, unspecified with unspecified complications: Secondary | ICD-10-CM

## 2019-01-13 MED ORDER — NA SULFATE-K SULFATE-MG SULF 17.5-3.13-1.6 GM/177ML PO SOLN: 1.0000 | Freq: Once | ORAL | 0 refills | Status: AC

## 2019-01-13 MED ORDER — PREDNISONE 10 MG PO TABS: 20.0000 mg | ORAL_TABLET | Freq: Every day | ORAL | 0 refills | Status: DC

## 2019-01-13 NOTE — Telephone Encounter (Signed)
See telephone note.

## 2019-01-13 NOTE — Telephone Encounter (Signed)
Patient scheduled for Colon @ [facility] 01/24/19 @12 :00. Pre visit and COVID testing scheduled. Patient aware of quarantine. Su-prep sent to pharmacy. Endo notified of DDAVP infusion needed 1 hr. Before and put in comments in order in Epic. Spoke to [employer] in Endo and said they do not need the letter from Hematologist it is standard dose. Prednisone order sent in and patient called with instructions, including possible need to increase to 40 mg. Notified to continue Thiopurine and Vanco and continue holding Humira

## 2019-01-13 NOTE — Telephone Encounter (Signed)
Thanks so much Sherlynn Stalls I appreciate it.

## 2019-01-14 ENCOUNTER — Telehealth: Payer: Self-pay

## 2019-01-14 NOTE — Telephone Encounter (Signed)
-----   Message from Steva Ready, RN sent at 01/14/2019  8:25 AM EDT ----- Marykay Lex.    Hospital pt's arrive 1 1/2 hours before the procedure so Mrs Soltero has a 12 noon procedure, arriving at 32.  - Does Mrs Chea need to be there 1 hour before that at 930??   Or is 1030 okay??   Thanks for your help!!!    Lelan Pons

## 2019-01-14 NOTE — Telephone Encounter (Signed)
Called patient back and left message that she does need to be at [facility] @ 9:30am on 12/2918 because of the extra hour they need to make sure her DDAVP infusion has time to get in. Also gave her her Pre visit appt. And that it will be via phone.

## 2019-01-14 NOTE — Telephone Encounter (Signed)
Called patient and answered her questions.

## 2019-01-14 NOTE — Telephone Encounter (Signed)
Pt returned your call, pls call her again. pls try both phones on file.

## 2019-01-17 ENCOUNTER — Ambulatory Visit (AMBULATORY_SURGERY_CENTER): Payer: Self-pay

## 2019-01-17 ENCOUNTER — Other Ambulatory Visit: Payer: Self-pay

## 2019-01-17 VITALS — Ht 59.0 in | Wt 173.0 lb

## 2019-01-17 DIAGNOSIS — K51318 Ulcerative (chronic) rectosigmoiditis with other complication: Secondary | ICD-10-CM

## 2019-01-17 MED ORDER — NA SULFATE-K SULFATE-MG SULF 17.5-3.13-1.6 GM/177ML PO SOLN: 1.0000 | Freq: Once | ORAL | 0 refills | Status: DC

## 2019-01-17 NOTE — Progress Notes (Signed)
Denies allergies to eggs or soy products. Denies complication of anesthesia or sedation. Denies use of weight loss medication. Denies use of O2.   Emmi instructions given for colonoscopy.   Pre-Visit was conducted by phone due to Covid 19. Instructions were reviewed and given to Dotty to take to her mother. Patient was encouraged to call if she has any questions or concerns.

## 2019-01-21 ENCOUNTER — Ambulatory Visit (HOSPITAL_COMMUNITY)
Admission: RE | Admit: 2019-01-21 | Discharge: 2019-01-21 | Disposition: A | Discharge status: Home / Self Care | Payer: BLUE CROSS/BLUE SHIELD | Source: Ambulatory Visit | Attending: Gastroenterology | Admitting: Gastroenterology

## 2019-01-21 ENCOUNTER — Telehealth: Payer: Self-pay | Admitting: Gastroenterology

## 2019-01-21 ENCOUNTER — Other Ambulatory Visit: Payer: Self-pay

## 2019-01-21 DIAGNOSIS — Z1159 Encounter for screening for other viral diseases: Secondary | ICD-10-CM | POA: Insufficient documentation

## 2019-01-21 NOTE — Telephone Encounter (Signed)
Pt called to advised that she has increased the predniSONE (DELTASONE) 10 MG tablet [[id number]]  To 33m this week due to the fact the original dosage was not effective.

## 2019-01-22 ENCOUNTER — Other Ambulatory Visit: Payer: Self-pay

## 2019-01-22 ENCOUNTER — Telehealth: Payer: Self-pay

## 2019-01-22 DIAGNOSIS — K51919 Ulcerative colitis, unspecified with unspecified complications: Secondary | ICD-10-CM

## 2019-01-22 LAB — NOVEL CORONAVIRUS, NAA (HOSP ORDER, SEND-OUT TO REF LAB; TAT 18-24 HRS): SARS-CoV-2, NAA: NOT DETECTED

## 2019-01-22 MED ORDER — DEXAMETHASONE SODIUM PHOSPHATE 10 MG/ML IJ SOLN
INTRAMUSCULAR | Status: AC
  Filled 2019-01-22: qty 1

## 2019-01-22 MED ORDER — LIDOCAINE 2% (20 MG/ML) 5 ML SYRINGE
INTRAMUSCULAR | Status: AC
  Filled 2019-01-22: qty 5

## 2019-01-22 MED ORDER — SODIUM CHLORIDE 0.9 % IV SOLN: 0.3000 ug/kg | Freq: Once | INTRAVENOUS | Status: AC

## 2019-01-22 MED ORDER — MIDAZOLAM HCL 2 MG/2ML IJ SOLN
INTRAMUSCULAR | Status: AC
  Filled 2019-01-22: qty 2

## 2019-01-22 MED ORDER — PROPOFOL 10 MG/ML IV BOLUS
INTRAVENOUS | Status: AC
  Filled 2019-01-22: qty 40

## 2019-01-22 MED ORDER — ONDANSETRON HCL 4 MG/2ML IJ SOLN
INTRAMUSCULAR | Status: AC
  Filled 2019-01-22: qty 2

## 2019-01-22 MED ORDER — FENTANYL CITRATE (PF) 100 MCG/2ML IJ SOLN
INTRAMUSCULAR | Status: AC
  Filled 2019-01-22: qty 2

## 2019-01-22 NOTE — Telephone Encounter (Signed)
Thanks for letting me know. Will await her colonoscopy Friday. Thanks

## 2019-01-22 NOTE — Telephone Encounter (Signed)
Order for DDAVP pre procedure for 01/24/19 in Epic

## 2019-01-22 NOTE — Telephone Encounter (Signed)
-----   Message from Yetta Flock, MD sent at 01/22/2019 12:17 PM EDT ----- Sherlynn Stalls can you help put in Epic for me and I can sign it off? If an issue let me know, I haven't ordered that before. Thanks much ----- Message ----- From: Hughie Closs, RN Sent: 01/22/2019  10:03 AM EDT To: Yetta Flock, MD  Dr. Havery Moros:  I just got a call from Santiago Glad at [facility] and the letter I faxed her reference the DDAVP infusion that Ronica needs prior to her Colonoscopy is signed by an RN and she can not accept that as an order. Said she would need the order from a Dr., Can either be put in Epic or you could call her and she would take a verbal order from you. Please advise.  Thanks

## 2019-01-22 NOTE — Telephone Encounter (Signed)
Patient called to let us know (per Dr. Doyne Keel request) that she has had to increase her Prednisone from 10mg  to 40mg . Since the lower dose was  Not effective

## 2019-01-23 ENCOUNTER — Other Ambulatory Visit: Payer: Self-pay

## 2019-01-23 ENCOUNTER — Encounter (HOSPITAL_COMMUNITY): Payer: Self-pay | Admitting: *Deleted

## 2019-01-23 DIAGNOSIS — K51919 Ulcerative colitis, unspecified with unspecified complications: Secondary | ICD-10-CM

## 2019-01-23 NOTE — Progress Notes (Signed)
sw patient, verified arrival time of 0930.  Verified patient has self quaratined, has no new symptoms and has not been around anyone who has been sick.  Answered questions regarding arrival and medications.

## 2019-01-24 ENCOUNTER — Ambulatory Visit (HOSPITAL_COMMUNITY): Payer: BC Managed Care – PPO | Admitting: Anesthesiology

## 2019-01-24 ENCOUNTER — Encounter (HOSPITAL_COMMUNITY)
Admission: RE | Disposition: A | Discharge status: Home / Self Care | Payer: Self-pay | Source: Home / Self Care | Attending: Gastroenterology

## 2019-01-24 ENCOUNTER — Encounter (HOSPITAL_COMMUNITY): Payer: Self-pay | Admitting: *Deleted

## 2019-01-24 ENCOUNTER — Other Ambulatory Visit: Payer: Self-pay

## 2019-01-24 ENCOUNTER — Ambulatory Visit (HOSPITAL_COMMUNITY)
Admission: RE | Admit: 2019-01-24 | Discharge: 2019-01-24 | Disposition: A | Discharge status: Home / Self Care | Payer: BC Managed Care – PPO | Attending: Gastroenterology | Admitting: Gastroenterology

## 2019-01-24 DIAGNOSIS — K51919 Ulcerative colitis, unspecified with unspecified complications: Secondary | ICD-10-CM

## 2019-01-24 DIAGNOSIS — Z79899 Other long term (current) drug therapy: Secondary | ICD-10-CM | POA: Insufficient documentation

## 2019-01-24 DIAGNOSIS — I10 Essential (primary) hypertension: Secondary | ICD-10-CM | POA: Diagnosis not present

## 2019-01-24 DIAGNOSIS — Z886 Allergy status to analgesic agent status: Secondary | ICD-10-CM | POA: Diagnosis not present

## 2019-01-24 DIAGNOSIS — K5180 Other ulcerative colitis without complications: Secondary | ICD-10-CM | POA: Diagnosis not present

## 2019-01-24 DIAGNOSIS — K5120 Ulcerative (chronic) proctitis without complications: Secondary | ICD-10-CM | POA: Insufficient documentation

## 2019-01-24 DIAGNOSIS — Z881 Allergy status to other antibiotic agents status: Secondary | ICD-10-CM | POA: Diagnosis not present

## 2019-01-24 DIAGNOSIS — K5140 Inflammatory polyps of colon without complications: Secondary | ICD-10-CM | POA: Diagnosis not present

## 2019-01-24 DIAGNOSIS — D126 Benign neoplasm of colon, unspecified: Secondary | ICD-10-CM

## 2019-01-24 DIAGNOSIS — Z09 Encounter for follow-up examination after completed treatment for conditions other than malignant neoplasm: Secondary | ICD-10-CM | POA: Insufficient documentation

## 2019-01-24 DIAGNOSIS — K5150 Left sided colitis without complications: Secondary | ICD-10-CM

## 2019-01-24 DIAGNOSIS — D680 Von Willebrand's disease: Secondary | ICD-10-CM | POA: Insufficient documentation

## 2019-01-24 DIAGNOSIS — F329 Major depressive disorder, single episode, unspecified: Secondary | ICD-10-CM | POA: Insufficient documentation

## 2019-01-24 DIAGNOSIS — K635 Polyp of colon: Secondary | ICD-10-CM | POA: Diagnosis not present

## 2019-01-24 HISTORY — PX: COLONOSCOPY WITH PROPOFOL: SHX5780

## 2019-01-24 HISTORY — PX: BIOPSY: SHX5522

## 2019-01-24 HISTORY — PX: POLYPECTOMY: SHX5525

## 2019-01-24 SURGERY — COLONOSCOPY WITH PROPOFOL
Anesthesia: Monitor Anesthesia Care

## 2019-01-24 MED ORDER — EPHEDRINE SULFATE 50 MG/ML IJ SOLN
INTRAMUSCULAR | Status: DC | PRN
  Administered 2019-01-24: 10 mg via INTRAVENOUS

## 2019-01-24 MED ORDER — LACTATED RINGERS IV SOLN: INTRAVENOUS | Status: DC

## 2019-01-24 MED ORDER — SODIUM CHLORIDE 0.9 % IV SOLN
23.6000 ug | Freq: Once | INTRAVENOUS | Status: AC
  Administered 2019-01-24: 23.6 ug via INTRAVENOUS
  Filled 2019-01-24: qty 5.9

## 2019-01-24 MED ORDER — SODIUM CHLORIDE 0.9 % IV SOLN
INTRAVENOUS | Status: DC
  Administered 2019-01-24: 1000 mL via INTRAVENOUS

## 2019-01-24 MED ORDER — SODIUM CHLORIDE 0.9 % IV SOLN
0.3000 ug/kg | Freq: Once | INTRAVENOUS | Status: DC
  Filled 2019-01-24: qty 5.9

## 2019-01-24 MED ORDER — PROPOFOL 500 MG/50ML IV EMUL
INTRAVENOUS | Status: DC | PRN
  Administered 2019-01-24: 30 mg via INTRAVENOUS

## 2019-01-24 MED ORDER — PROPOFOL 500 MG/50ML IV EMUL
INTRAVENOUS | Status: DC | PRN
  Administered 2019-01-24: 150 ug/kg/min via INTRAVENOUS

## 2019-01-24 MED ORDER — PREDNISONE 10 MG PO TABS: ORAL_TABLET | ORAL | 0 refills | Status: DC

## 2019-01-24 SURGICAL SUPPLY — 21 items

## 2019-01-24 NOTE — Interval H&P Note (Signed)
History and Physical Interval Note:  01/24/2019 11:44 AM  Lauren [name]  has presented today for surgery, with the diagnosis of colitis flare up.  The various methods of treatment have been discussed with the patient and family. After consideration of risks, benefits and other options for treatment, the patient has consented to  Procedure(s): COLONOSCOPY WITH PROPOFOL (N/A) as a surgical intervention.  The patient's history has been reviewed, patient examined, no change in status, stable for surgery.  I have reviewed the patient's chart and labs.  Questions were answered to the patient's satisfaction.     [place]

## 2019-01-24 NOTE — Progress Notes (Signed)
Per Dr. Havery Moros, prednisone taper ordered.

## 2019-01-24 NOTE — Anesthesia Preprocedure Evaluation (Signed)
Anesthesia Evaluation  Patient identified by MRN, date of birth, ID band Patient awake    Reviewed: Allergy & Precautions, NPO status , Patient's Chart, lab work & pertinent test results  Airway Mallampati: II  TM Distance: >3 FB Neck ROM: Full    Dental no notable dental hx.    Pulmonary neg pulmonary ROS,    Pulmonary exam normal breath sounds clear to auscultation       Cardiovascular hypertension, Pt. on medications and Pt. on home beta blockers Normal cardiovascular exam+ dysrhythmias Supra Ventricular Tachycardia  Rhythm:Regular Rate:Normal     Neuro/Psych negative neurological ROS  negative psych ROS   GI/Hepatic Neg liver ROS, UC   Endo/Other  negative endocrine ROS  Renal/GU negative Renal ROS  negative genitourinary   Musculoskeletal negative musculoskeletal ROS (+)   Abdominal   Peds negative pediatric ROS (+)  Hematology  (+) Blood dyscrasia, ,   Anesthesia Other Findings   Reproductive/Obstetrics negative OB ROS                             Anesthesia Physical Anesthesia Plan  ASA: II  Anesthesia Plan: MAC   Post-op Pain Management:    Induction: Intravenous  PONV Risk Score and Plan: 0  Airway Management Planned: Simple Face Mask  Additional Equipment:   Intra-op Plan:   Post-operative Plan:   Informed Consent: I have reviewed the patients History and Physical, chart, labs and discussed the procedure including the risks, benefits and alternatives for the proposed anesthesia with the patient or authorized representative who has indicated his/her understanding and acceptance.     Dental advisory given  Plan Discussed with: CRNA and Surgeon  Anesthesia Plan Comments:         Anesthesia Quick Evaluation

## 2019-01-24 NOTE — Op Note (Addendum)
[facility] Patient Name: Lauren [name] Procedure Date: 01/24/2019 MRN: [id number] Attending MD: Willaim Rayas. Adela Lank , MD Date of Birth: 09-22-1960 CSN: [id number] Age: 58 Admit Type: Inpatient Procedure:                Colonoscopy Indications:              Follow-up of left-sided chronic ulcerative colitis.                            Previously on Remicade, Entyvio, recently switched                            to Humira + and no improvement, worsening                            flare, recently treated for C diff. Now on                            prednisone 40mg  / day. Patient received DDAVP prior                            to this exam for bleeding disorder. Providers:                Willaim Rayas. Adela Lank, MD, Dwain Sarna, RN, Brion Aliment, Technician Referring MD:              Medicines:                Monitored Anesthesia Care Complications:            No immediate complications. Estimated blood loss:                            Minimal. Estimated Blood Loss:     Estimated blood loss was minimal. Procedure:                Pre-Anesthesia Assessment:                           - Prior to the procedure, a History and Physical                            was performed, and patient medications and                            allergies were reviewed. The patient's tolerance of                            previous anesthesia was also reviewed. The risks                            and benefits of the procedure and the sedation  options and risks were discussed with the patient.                            All questions were answered, and informed consent                            was obtained. Prior Anticoagulants: The patient has                            taken no previous anticoagulant or antiplatelet                            agents. ASA Grade Assessment: II - A patient with                            mild  systemic disease. After reviewing the risks                            and benefits, the patient was deemed in                            satisfactory condition to undergo the procedure.                           After obtaining informed consent, the colonoscope                            was passed under direct vision. Throughout the                            procedure, the patient's blood pressure, pulse, and                            oxygen saturations were monitored continuously. The                            PCF-H190DL ([id number] Olympus pediatric colonscope                            was introduced through the anus and advanced to the                            the terminal ileum, with identification of the                            appendiceal orifice and IC valve. The colonoscopy                            was performed without difficulty. The patient                            tolerated the procedure well. The quality of the  bowel preparation was adequate. The terminal ileum,                            ileocecal valve, appendiceal orifice, and rectum                            were photographed. Scope In: 12:12:35 PM Scope Out: 12:37:30 PM Scope Withdrawal Time: 0 hours 20 minutes 40 seconds  Total Procedure Duration: 0 hours 24 minutes 55 seconds  Findings:      The perianal and digital rectal examinations were normal.      The terminal ileum appeared normal.      Inflammation was found in the colon. This was very mild in the right and       proximal transverse colon (grade 1), grade 2 in most of the left colon       and grade 2 to 3 in the rectum, and when compared to the previous       examination, the findings are worsened. Biopsies were taken with a cold       forceps for histology in right, transverse, left colon.      A 4 mm polypoid lesion was found in the cecum. The polyp was sessile.       The polyp was removed with a cold snare. Resection  and retrieval were       complete.      The exam was otherwise without abnormality. Impression:               - The examined portion of the ileum was normal.                           - Active inflammation as described - severe in the                            left colon, and mild in the right and transverse                            colon - extent of inflammatory change has                            progressed (previously left sided only). Biopsied                            in right, left, transverse colon - rule out CMV                            given recent imunosuppression.                           - One 4 mm polyp in the cecum, removed with a cold                            snare. Resected and retrieved.                           - The examination was otherwise normal. Moderate Sedation:  No moderate sedation, case performed with MAC Recommendation:           - Patient has a contact number available for                            emergencies. The signs and symptoms of potential                            delayed complications were discussed with the                            patient. Return to normal activities tomorrow.                            Written discharge instructions were provided to the                            patient.                           - Resume previous diet.                           - Await pathology results.                           - Continue present medications - prednisone 40mg  /                            day for 2 weeks, then taper by 5mg  / week if                            tolerated (patient endorses improvement in symptoms                            since starting prednsone 40mg  earlier this week -                            hopefully this continues)                           - Take transexamic acid as per hematology to reduce                            post procedure risk of bleeding given bleeding                             disorder                           - Will discuss options with patient. Humira has                            been stopped, will consider Valentina Lucks, or  surgical consultation given failure of other                            regimens in the past. Procedure Code(s):        --- Professional ---                           [phone], Colonoscopy, flexible; with removal of                            tumor(s), polyp(s), or other lesion(s) by snare                            technique                           45380, 59, Colonoscopy, flexible; with biopsy,                            single or multiple Diagnosis Code(s):        --- Professional ---                           K63.5, Polyp of colon                           K51.50, Left sided colitis without complications CPT copyright 2019 American Medical Association. All rights reserved. The codes documented in this report are preliminary and upon coder review may  be revised to meet current compliance requirements. Viviann Spare P. Teckla Christiansen, MD 01/24/2019 12:54:56 PM This report has been signed electronically. Number of Addenda: 0

## 2019-01-24 NOTE — Discharge Instructions (Signed)
YOU HAD AN ENDOSCOPIC PROCEDURE TODAY: Refer to the procedure report and other information in the discharge instructions given to you for any specific questions about what was found during the examination. If this information does not answer your questions, please call [place] office at [phone] to clarify.  ° °YOU SHOULD EXPECT: Some feelings of bloating in the abdomen. Passage of more gas than usual. Walking can help get rid of the air that was put into your GI tract during the procedure and reduce the bloating. If you had a lower endoscopy (such as a colonoscopy or flexible sigmoidoscopy) you may notice spotting of blood in your stool or on the toilet paper. Some abdominal soreness may be present for a day or two, also. ° °DIET: Your first meal following the procedure should be a light meal and then it is ok to progress to your normal diet. A half-sandwich or bowl of soup is an example of a good first meal. Heavy or fried foods are harder to digest and may make you feel nauseous or bloated. Drink plenty of fluids but you should avoid alcoholic beverages for 24 hours. If you had a esophageal dilation, please see attached instructions for diet.   ° °ACTIVITY: Your care partner should take you home directly after the procedure. You should plan to take it easy, moving slowly for the rest of the day. You can resume normal activity the day after the procedure however YOU SHOULD NOT DRIVE, use power tools, machinery or perform tasks that involve climbing or major physical exertion for 24 hours (because of the sedation medicines used during the test).  ° °SYMPTOMS TO REPORT IMMEDIATELY: °A gastroenterologist can be reached at any hour. Please call [phone]  for any of the following symptoms:  °Following lower endoscopy (colonoscopy, flexible sigmoidoscopy) °Excessive amounts of blood in the stool  °Significant tenderness, worsening of abdominal pains  °Swelling of the abdomen that is new, acute  °Fever of 100° or  higher  °Following upper endoscopy (EGD, EUS, ERCP, esophageal dilation) °Vomiting of blood or coffee ground material  °New, significant abdominal pain  °New, significant chest pain or pain under the shoulder blades  °Painful or persistently difficult swallowing  °New shortness of breath  °Black, tarry-looking or red, bloody stools ° °FOLLOW UP:  °If any biopsies were taken you will be contacted by phone or by letter within the next 1-3 weeks. Call [phone]  if you have not heard about the biopsies in 3 weeks.  °Please also call with any specific questions about appointments or follow up tests. ° °

## 2019-01-24 NOTE — Anesthesia Postprocedure Evaluation (Signed)
Anesthesia Post Note  Patient: Lauren [name]  Procedure(s) Performed: COLONOSCOPY WITH PROPOFOL (N/A ) POLYPECTOMY BIOPSY     Patient location during evaluation: PACU Anesthesia Type: MAC Level of consciousness: awake and alert Pain management: pain level controlled Vital Signs Assessment: post-procedure vital signs reviewed and stable Respiratory status: spontaneous breathing, nonlabored ventilation, respiratory function stable and patient connected to nasal cannula oxygen Cardiovascular status: stable and blood pressure returned to baseline Postop Assessment: no apparent nausea or vomiting Anesthetic complications: no    Last Vitals:  Vitals:   01/24/19 1155 01/24/19 1246  BP:  (!) 119/47  Pulse: 66 73  Resp: 18 20  Temp:    SpO2: 99% 100%    Last Pain:  Vitals:   01/24/19 1246  TempSrc:   PainSc: 0-No pain                 Rosendo Couser S

## 2019-01-24 NOTE — Transfer of Care (Signed)
Immediate Anesthesia Transfer of Care Note  Patient: Lauren [name]  Procedure(s) Performed: COLONOSCOPY WITH PROPOFOL (N/A ) POLYPECTOMY BIOPSY  Patient Location: PACU  Anesthesia Type:MAC  Level of Consciousness: awake, alert  and oriented  Airway & Oxygen Therapy: Patient Spontanous Breathing and Patient connected to face mask oxygen  Post-op Assessment: Report given to RN and Post -op Vital signs reviewed and stable  Post vital signs: Reviewed and stable  Last Vitals:  Vitals Value Taken Time  BP    Temp    Pulse    Resp    SpO2      Last Pain:  Vitals:   01/24/19 0955  TempSrc: Oral  PainSc: 0-No pain         Complications: No apparent anesthesia complications

## 2019-01-27 ENCOUNTER — Encounter (HOSPITAL_COMMUNITY): Payer: Self-pay | Admitting: Gastroenterology

## 2019-01-29 ENCOUNTER — Other Ambulatory Visit: Payer: Self-pay

## 2019-01-29 ENCOUNTER — Telehealth: Payer: Self-pay

## 2019-01-29 DIAGNOSIS — K51919 Ulcerative colitis, unspecified with unspecified complications: Secondary | ICD-10-CM

## 2019-01-29 NOTE — Telephone Encounter (Signed)
So glad to hear she is feeling better, glad the steroids are working, she should continue the regimen. Hopefully she can get approved for the Stelara.

## 2019-01-29 NOTE — Telephone Encounter (Signed)
Working on getting patient approved for [employer] Patient scheduled for first TwinRx inj. On 02/05/19. When I called patient to schedule the injection, she wanted to let Dr. Havery Moros know: she has more energy, no blood in her stool and BMs are mushy to semi-formed.she only goes small amts. At a time, which she thinks is because of her chronic hemorrhoids. She has a little blood on tissue paper because of them. BMs are less frequent.

## 2019-02-03 ENCOUNTER — Telehealth: Payer: Self-pay

## 2019-02-03 NOTE — Telephone Encounter (Signed)
-----  Message from Darden Dates sent at 02/03/2019 12:45 PM EDT ----- Vaughan Basta just called me.  She said she definitely wants to do it at [facility].  I told her that Colletta Maryland from there will be calling her to set up appt.  Just need you to please fax over records please. Thanks,Amy ----- Message ----- From: Hughie Closs, RN Sent: 01/31/2019  12:53 PM EDT To: Darden Dates  Thanks for update Amy ----- Message ----- From: Darden Dates Sent: 01/31/2019   9:29 AM EDT To: Hughie Closs, RN  Raynie Steinhaus, So I noticed that Leanah has been seen at [facility] for her infusions.  They actually infuse Stelara and injections too.  I called and confirmed that with Colletta Maryland there.  I have left a msg for Lujain to call me about doing everything there.  If she is good to go with that, then I will need to get you to send the records to [facility] and they will take care of the appt, precert and all.  Just wanted to keep you in the loop! I'll let you know when I hear back from her. Thanks, Amy ----- Message ----- From: Hughie Closs, RN Sent: 01/30/2019   9:29 AM EDT To: Darden Dates  I checked with patient, and she would like to proceed.  Thanks ----- Message ----- From: Darden Dates Sent: 01/30/2019   9:02 AM EDT To: Hughie Closs, RN  Ok, here is the info that I have gotten this morning:  Per Lychee D @ [place] Patient has met her deductible and oop max for the year.  So everything she has done this year will be covered at 100%. Both infusion and injection require precertification. @ [id number] When her deductible starts over at the beginning of the year, charges will apply to her $6900 deductible once again.  So everything is covered at 100% right now and if she is ok with paying out $6900 again at the beginning of next year, I will get precerts started.  Just let me know. Thanks, Amy ----- Message ----- From: Hughie Closs, RN Sent: 01/29/2019   3:55 PM EDT To: Darden Dates  Did it. Thanks ----- Message ----- From: Darden Dates Sent: 01/29/2019   2:38 PM EDT To: Hughie Closs, RN  I sure can.  I just need you to put in an ambulatory referral with dx code so I can give the insurance company the correct info when they are checking for coverage for that service. Thanks, Amy ----- Message ----- From: Hughie Closs, RN Sent: 01/29/2019   2:02 PM EDT To: Darden Dates  Hi Amy,  Dr. Havery Moros wants to start this patient on Stelara. Could you check if her Ins. Will cover for the intial infusion to be done at the hospital ?  Thanks  Sherlynn Stalls

## 2019-02-03 NOTE — Telephone Encounter (Signed)
Records faxed to [facility] at [facility].

## 2019-02-05 ENCOUNTER — Ambulatory Visit (INDEPENDENT_AMBULATORY_CARE_PROVIDER_SITE_OTHER): Payer: BC Managed Care – PPO | Admitting: Gastroenterology

## 2019-02-05 ENCOUNTER — Other Ambulatory Visit: Payer: Self-pay

## 2019-02-05 DIAGNOSIS — K51919 Ulcerative colitis, unspecified with unspecified complications: Secondary | ICD-10-CM

## 2019-02-05 DIAGNOSIS — Z23 Encounter for immunization: Secondary | ICD-10-CM

## 2019-02-05 NOTE — Progress Notes (Unsigned)
Order entered for Twinrix series

## 2019-02-11 ENCOUNTER — Telehealth: Payer: Self-pay | Admitting: Gastroenterology

## 2019-02-11 NOTE — Telephone Encounter (Signed)
Antibiotic is to treat a UTI. Patient's Colitis is some better. Frequency is less often and stools are more formed. Patient will try OTC Florastor BID. Patient decreased Prednisone to 30mg , 3 days ago. Stelara has been approved with zero copay. Patient has an appt. With the Dr. At [facility] today

## 2019-02-11 NOTE — Telephone Encounter (Signed)
Sherlynn Stalls can you clarify, is this a UTI she is being treated for? She can take some florastor twice daily while on that.   Otherwise, can you see how she is doing in regards to her colitis? Any word on her application for Stelara? Curious how much prednisone she is on currently as well. Thanks

## 2019-02-11 NOTE — Telephone Encounter (Signed)
Patient called in wanting to let the nurse know that she is being treated for morganella/morganii with the medication saulfameth/trimethoprim 800/160 mg for 10days. She stated that the side effect reads may cause c.diff and colitis. So she is wanting advice if the doctor feels as though she needs to take probiotics or not while taking the medication. She would like a call to advised. Thanks.

## 2019-02-12 NOTE — Telephone Encounter (Signed)
Great thanks, I appreciate the update.

## 2019-02-26 ENCOUNTER — Telehealth: Payer: Self-pay | Admitting: Gastroenterology

## 2019-02-26 DIAGNOSIS — R7401 Elevation of levels of liver transaminase levels: Secondary | ICD-10-CM

## 2019-02-26 NOTE — Telephone Encounter (Signed)
Patient had routine labs done by PCP, noted in [place]. ALT has risen from normal to 70s, to low 100s, as of last week. Recommend repeat LFTs now. If ALT rising will need to hold 6MP. Discussed with the patient's daughter who will relay to the patient.

## 2019-03-04 ENCOUNTER — Other Ambulatory Visit: Payer: Self-pay | Admitting: Physician Assistant

## 2019-03-04 DIAGNOSIS — Z1231 Encounter for screening mammogram for malignant neoplasm of breast: Secondary | ICD-10-CM

## 2019-03-07 ENCOUNTER — Other Ambulatory Visit: Payer: Self-pay

## 2019-03-07 ENCOUNTER — Ambulatory Visit (INDEPENDENT_AMBULATORY_CARE_PROVIDER_SITE_OTHER): Payer: BC Managed Care – PPO | Admitting: Gastroenterology

## 2019-03-07 ENCOUNTER — Other Ambulatory Visit (INDEPENDENT_AMBULATORY_CARE_PROVIDER_SITE_OTHER): Payer: BC Managed Care – PPO

## 2019-03-07 DIAGNOSIS — R740 Nonspecific elevation of levels of transaminase and lactic acid dehydrogenase [LDH]: Secondary | ICD-10-CM

## 2019-03-07 DIAGNOSIS — Z23 Encounter for immunization: Secondary | ICD-10-CM

## 2019-03-07 DIAGNOSIS — R7401 Elevation of levels of liver transaminase levels: Secondary | ICD-10-CM

## 2019-03-07 LAB — HEPATIC FUNCTION PANEL
ALT: 86 U/L — ABNORMAL HIGH (ref 0–35)
AST: 41 U/L — ABNORMAL HIGH (ref 0–37)
Albumin: 3.7 g/dL (ref 3.5–5.2)
Alkaline Phosphatase: 43 U/L (ref 39–117)
Bilirubin, Direct: 0.2 mg/dL (ref 0.0–0.3)
Total Bilirubin: 1.4 mg/dL — ABNORMAL HIGH (ref 0.2–1.2)
Total Protein: 6.5 g/dL (ref 6.0–8.3)

## 2019-03-07 MED ORDER — PREDNISONE 10 MG PO TABS: 20.0000 mg | ORAL_TABLET | Freq: Every day | ORAL | 0 refills | Status: DC

## 2019-03-07 NOTE — Progress Notes (Signed)
Pt came in for her 2nd twinrix.  She indicated Dr. Havery Moros had advised her in a MyChart message from this morningthat she could increase her prednisone back to 20mg  daily and increase to 30mg  prn to treat flare.  Per note by Armbruster in Patient Message, sent script for additional prednisone to pt pharmacy.Marland Kitchen

## 2019-03-07 NOTE — Progress Notes (Signed)
Administered 2nd Twinrix injection.  Next due in 5 months after 08-07-2019

## 2019-03-10 ENCOUNTER — Other Ambulatory Visit: Payer: BC Managed Care – PPO

## 2019-03-10 ENCOUNTER — Other Ambulatory Visit: Payer: Self-pay

## 2019-03-10 DIAGNOSIS — K51919 Ulcerative colitis, unspecified with unspecified complications: Secondary | ICD-10-CM

## 2019-03-11 ENCOUNTER — Other Ambulatory Visit (INDEPENDENT_AMBULATORY_CARE_PROVIDER_SITE_OTHER): Payer: BC Managed Care – PPO

## 2019-03-11 DIAGNOSIS — K51919 Ulcerative colitis, unspecified with unspecified complications: Secondary | ICD-10-CM

## 2019-03-11 LAB — CBC WITH DIFFERENTIAL/PLATELET
Basophils Absolute: 0 10*3/uL (ref 0.0–0.1)
Basophils Relative: 0.1 % (ref 0.0–3.0)
Eosinophils Absolute: 0 10*3/uL (ref 0.0–0.7)
Eosinophils Relative: 0 % (ref 0.0–5.0)
HCT: 32 % — ABNORMAL LOW (ref 36.0–46.0)
Hemoglobin: 10.7 g/dL — ABNORMAL LOW (ref 12.0–15.0)
Lymphocytes Relative: 14.4 % (ref 12.0–46.0)
Lymphs Abs: 0.4 10*3/uL — ABNORMAL LOW (ref 0.7–4.0)
MCHC: 33.5 g/dL (ref 30.0–36.0)
MCV: 104.7 fl — ABNORMAL HIGH (ref 78.0–100.0)
Monocytes Absolute: 0.1 10*3/uL (ref 0.1–1.0)
Monocytes Relative: 3.7 % (ref 3.0–12.0)
Neutro Abs: 2.4 10*3/uL (ref 1.4–7.7)
Neutrophils Relative %: 81.8 % — ABNORMAL HIGH (ref 43.0–77.0)
Platelets: 227 10*3/uL (ref 150.0–400.0)
RBC: 3.06 Mil/uL — ABNORMAL LOW (ref 3.87–5.11)
RDW: 20.2 % — ABNORMAL HIGH (ref 11.5–15.5)
WBC: 2.9 10*3/uL — ABNORMAL LOW (ref 4.0–10.5)

## 2019-03-13 ENCOUNTER — Other Ambulatory Visit: Payer: Self-pay

## 2019-03-13 DIAGNOSIS — R7401 Elevation of levels of liver transaminase levels: Secondary | ICD-10-CM

## 2019-03-13 DIAGNOSIS — K51919 Ulcerative colitis, unspecified with unspecified complications: Secondary | ICD-10-CM

## 2019-03-13 NOTE — Progress Notes (Signed)
Per Armbruster, labs in 2 weeks.

## 2019-03-18 LAB — THIOPURINE METABOLITES
6 MMP(6-Methylmercaptopurine): 56053 pmol/8x10(8)RBC — ABNORMAL HIGH (ref <5700)
6 TG(6-Thioguanine): 289 pmol/8x10(8)RBC (ref 235–400)

## 2019-03-21 ENCOUNTER — Telehealth: Payer: Self-pay

## 2019-03-21 NOTE — Telephone Encounter (Signed)
Called and left message for patient to come in for LFTs on 03/24/19 or as close to that as she can

## 2019-03-21 NOTE — Telephone Encounter (Signed)
-----   Message from Hughie Closs, RN sent at 03/10/2019 12:03 PM EDT ----- Call patient and have her come in for LFTs, also put it in [place] , due 03/24/19

## 2019-03-24 ENCOUNTER — Other Ambulatory Visit (INDEPENDENT_AMBULATORY_CARE_PROVIDER_SITE_OTHER): Payer: BC Managed Care – PPO

## 2019-03-24 DIAGNOSIS — R7401 Elevation of levels of liver transaminase levels: Secondary | ICD-10-CM

## 2019-03-24 DIAGNOSIS — R740 Nonspecific elevation of levels of transaminase and lactic acid dehydrogenase [LDH]: Secondary | ICD-10-CM | POA: Diagnosis not present

## 2019-03-24 DIAGNOSIS — K51919 Ulcerative colitis, unspecified with unspecified complications: Secondary | ICD-10-CM | POA: Diagnosis not present

## 2019-03-24 LAB — CBC WITH DIFFERENTIAL/PLATELET
Basophils Absolute: 0 10*3/uL (ref 0.0–0.1)
Basophils Relative: 0.4 % (ref 0.0–3.0)
Eosinophils Absolute: 0 10*3/uL (ref 0.0–0.7)
Eosinophils Relative: 0.3 % (ref 0.0–5.0)
HCT: 32.9 % — ABNORMAL LOW (ref 36.0–46.0)
Hemoglobin: 10.8 g/dL — ABNORMAL LOW (ref 12.0–15.0)
Lymphocytes Relative: 29.1 % (ref 12.0–46.0)
Lymphs Abs: 2.1 10*3/uL (ref 0.7–4.0)
MCHC: 32.9 g/dL (ref 30.0–36.0)
MCV: 99.7 fl (ref 78.0–100.0)
Monocytes Absolute: 1.1 10*3/uL — ABNORMAL HIGH (ref 0.1–1.0)
Monocytes Relative: 14.9 % — ABNORMAL HIGH (ref 3.0–12.0)
Neutro Abs: 3.9 10*3/uL (ref 1.4–7.7)
Neutrophils Relative %: 55.3 % (ref 43.0–77.0)
Platelets: 455 10*3/uL — ABNORMAL HIGH (ref 150.0–400.0)
RBC: 3.3 Mil/uL — ABNORMAL LOW (ref 3.87–5.11)
RDW: 20.3 % — ABNORMAL HIGH (ref 11.5–15.5)
WBC: 7.1 10*3/uL (ref 4.0–10.5)

## 2019-03-24 LAB — HEPATIC FUNCTION PANEL
ALT: 35 U/L (ref 0–35)
AST: 19 U/L (ref 0–37)
Albumin: 3.7 g/dL (ref 3.5–5.2)
Alkaline Phosphatase: 48 U/L (ref 39–117)
Bilirubin, Direct: 0.1 mg/dL (ref 0.0–0.3)
Total Bilirubin: 0.6 mg/dL (ref 0.2–1.2)
Total Protein: 6.9 g/dL (ref 6.0–8.3)

## 2019-03-25 ENCOUNTER — Other Ambulatory Visit: Payer: Self-pay

## 2019-03-25 MED ORDER — HYDROCORTISONE ACETATE 25 MG RE SUPP: RECTAL | 1 refills | Status: DC

## 2019-03-25 NOTE — Progress Notes (Signed)
Per result note, sent script for Anusol suppositories, use once to twice daily for 10 days

## 2019-03-28 ENCOUNTER — Telehealth: Payer: Self-pay

## 2019-03-28 NOTE — Telephone Encounter (Signed)
Thanks [name]. She can also use topical 1% hydrocortisone cream, apply pea sized amount PRN or use a glycerin suppository OTC to administer and both of those are OTC. Thanks

## 2019-03-28 NOTE — Telephone Encounter (Signed)
Called and LM for pt to call back to get scheduled for an OV with Armbruster for Ulcerative Colitis at the end of August or beginning of Sept

## 2019-03-28 NOTE — Telephone Encounter (Signed)
-----   Message from Roetta Sessions, [place] sent at 03/25/2019  4:22 PM EDT ----- Regarding: needs OV - UC/hemorrhoids Schedule pt an OV in last August or early Sept When schedule opens

## 2019-03-28 NOTE — Telephone Encounter (Signed)
Called and scheduled pt for OV in Sept.  Will be due for labs at that appt.  Pt reports her hemorrhoids are feeling better. She did not pick up the Anusol because she it was very expensive and she started on some meds from her urologist for a kidney stone and they are helping her feel better. We looked on GoodRx and she can get 12 Anusol suppositories for about $45. If her symptoms return she indicates she will ask Dr. Havery Moros for an alternative if possible or GoodRx.

## 2019-04-14 ENCOUNTER — Ambulatory Visit: Payer: BC Managed Care – PPO

## 2019-05-01 ENCOUNTER — Encounter: Payer: Self-pay | Admitting: Gastroenterology

## 2019-05-01 ENCOUNTER — Ambulatory Visit (INDEPENDENT_AMBULATORY_CARE_PROVIDER_SITE_OTHER): Payer: BC Managed Care – PPO | Admitting: Gastroenterology

## 2019-05-01 ENCOUNTER — Other Ambulatory Visit (INDEPENDENT_AMBULATORY_CARE_PROVIDER_SITE_OTHER): Payer: BC Managed Care – PPO

## 2019-05-01 VITALS — BP 110/74 | HR 68 | Temp 97.8°F | Ht 59.0 in | Wt 175.0 lb

## 2019-05-01 DIAGNOSIS — K51919 Ulcerative colitis, unspecified with unspecified complications: Secondary | ICD-10-CM

## 2019-05-01 DIAGNOSIS — Z23 Encounter for immunization: Secondary | ICD-10-CM | POA: Diagnosis not present

## 2019-05-01 DIAGNOSIS — Z79899 Other long term (current) drug therapy: Secondary | ICD-10-CM | POA: Diagnosis not present

## 2019-05-01 LAB — CBC WITH DIFFERENTIAL/PLATELET
Basophils Absolute: 0 10*3/uL (ref 0.0–0.1)
Basophils Relative: 0.5 % (ref 0.0–3.0)
Eosinophils Absolute: 0 10*3/uL (ref 0.0–0.7)
Eosinophils Relative: 0.7 % (ref 0.0–5.0)
HCT: 29.6 % — ABNORMAL LOW (ref 36.0–46.0)
Hemoglobin: 9.6 g/dL — ABNORMAL LOW (ref 12.0–15.0)
Lymphocytes Relative: 30 % (ref 12.0–46.0)
Lymphs Abs: 1.8 10*3/uL (ref 0.7–4.0)
MCHC: 32.3 g/dL (ref 30.0–36.0)
MCV: 86 fl (ref 78.0–100.0)
Monocytes Absolute: 0.5 10*3/uL (ref 0.1–1.0)
Monocytes Relative: 8.2 % (ref 3.0–12.0)
Neutro Abs: 3.6 10*3/uL (ref 1.4–7.7)
Neutrophils Relative %: 60.6 % (ref 43.0–77.0)
Platelets: 291 10*3/uL (ref 150.0–400.0)
RBC: 3.45 Mil/uL — ABNORMAL LOW (ref 3.87–5.11)
RDW: 22.7 % — ABNORMAL HIGH (ref 11.5–15.5)
WBC: 6 10*3/uL (ref 4.0–10.5)

## 2019-05-01 LAB — COMPREHENSIVE METABOLIC PANEL
ALT: 19 U/L (ref 0–35)
AST: 17 U/L (ref 0–37)
Albumin: 3.7 g/dL (ref 3.5–5.2)
Alkaline Phosphatase: 51 U/L (ref 39–117)
BUN: 14 mg/dL (ref 6–23)
CO2: 30 mEq/L (ref 19–32)
Calcium: 9.2 mg/dL (ref 8.4–10.5)
Chloride: 101 mEq/L (ref 96–112)
Creatinine, Ser: 0.75 mg/dL (ref 0.40–1.20)
GFR: 79.32 mL/min (ref >60.00)
Glucose, Bld: 90 mg/dL (ref 70–99)
Potassium: 3.9 mEq/L (ref 3.5–5.1)
Sodium: 137 mEq/L (ref 135–145)
Total Bilirubin: 0.4 mg/dL (ref 0.2–1.2)
Total Protein: 7.1 g/dL (ref 6.0–8.3)

## 2019-05-01 MED ORDER — MESALAMINE 4 G RE ENEM: 4.0000 g | ENEMA | Freq: Every day | RECTAL | 2 refills | Status: DC

## 2019-05-01 NOTE — Patient Instructions (Addendum)
If you are age 58 or older, your body mass index should be between 23-30. Your Body mass index is 35.35 kg/m. If this is out of the aforementioned range listed, please consider follow up with your Primary Care Provider.  If you are age 43 or younger, your body mass index should be between 19-25. Your Body mass index is 35.35 kg/m. If this is out of the aformentioned range listed, please consider follow up with your Primary Care Provider.   To help prevent the possible spread of infection to our patients, communities, and staff; we will be implementing the following measures:  As of now we are not allowing any visitors/family members to accompany you to any upcoming appointments with [facility]. If you have any concerns about this please contact our office to discuss prior to the appointment.   Continue Stellar.  Use Rowasa enemas at bedtime for 2 weeks and then as needed thereafter.  Please go to the lab in the basement of our building to have lab work done as you leave today. Hit "B" for basement when you get on the elevator.  When the doors open the lab is on your left.  We will call you with the results. Thank you.  We are giving you a flu shot today and the Pneumococcal vacine today.  The phone number for [place] pharmacy is [phone].  Please call them to schedule your Shingrix vaccine series.  Thank you for entrusting me with your care and for choosing [facility], [facility] [place] Cellar

## 2019-05-01 NOTE — Progress Notes (Signed)
HPI :  Colitis History Left sided ulcerative colitis, diagnosed> 15years ago. On Remicade in the past for her colitis, which she thinks she did well for a few years. She had a severe MRSA infection of her ear and ultimately Remicade was held and eventually was stopped given she had done well when it was held. She has been on Imuran in the past, she tolerated it well, she thinks it was weaned off given she had been feeling well also. She has never been on Humira or other anti-TNF agents. She has never been on methotrexate. She has been on Lialda for a long time, prior to that Asacol. She has Von Willebrands. She bleeds easily but has never had significant bleeding / hemorrhages in the past. She has had DDAVP infusions prior to her last colonoscopy or Stilmate nose spray. Started on [facility] April 2018 following a few flares that Spring.Failed Entyvio 2019, started Humira and 6MP Feb / March 2020. Failed Humira / 6 MP combination therapy 12/2018. Hepatoxicity to thiopurines - 6MMP levels > 50K. Transitioned to Stelara monotherapy 02/2019. Now PANCOLONIC colitis.   SINCE LAST VISIT:  58 year old female here for follow-up visit.  At her last visit we sent her to the lab for C. difficile testing.  It was accidentally sent twice, 1 sample was positive, one was negative.  She was treated with vancomycin but did not get better.  She was given prednisone for flare of her colitis and ultimately scheduled for colonoscopy.  She was found to have severe left-sided inflammation and mild in the right and transverse colon.  Biopsies ruled out CMV.  She had 1 inflammatory polyp removed from the cecum.  She had clearly failed Humira on this regimen.  We had switched her over to [facility] and continued 6-MP.  She unfortunately developed a rise in her ALT level during this time. 6TG level was 289 but 6 MMP level was 56K - thiopurine stopped, ALT normalized  She otherwise has been weaning down off prednisone, currently  taking 2-1/2 mg a day and do to stop this weekend.  She had her first dose of Stelara on July 20 with IV infusion.  She tolerated it well.  She states her symptoms are significantly improved.  Urgency has resolved.  About 75% of her stools are formed.  She still has mild increase frequency up to 5 times a day.  No abdominal pains.  Generally she is feeling much better, concerned about her risk for flaring however as she comes off the prednisone.  Discussed options moving forward.  She is otherwise not had her flu shot or pneumococcal 23 vaccine yet.  She has not had a Shingrix vaccine.  She is due for tuberculosis testing.  IBD Health Care Maintenance: Annual Flu Vaccine - Date due Pneumococcal Vaccine if receiving immunosuppression: - Date PCV 13 - 05/21/2018, due for PPSV23 Zoster vaccine if over age 61: DUE TB testing if on anti-TNF, yearly - Date 9242019 - negative Vitamin D screening - Date Last Colonoscopy - Colonoscopy 01/24/2019 - severe left sided inflammation, mild in transverse and right colon, normal ileum. Benign inflammatory polyp of the cecum  Endoscopic history: Colonoscopy 2014 - active rectal inflammation, inflammatory polyp Colonoscopy 09/21/2015 - overall good control of colitis with small mild area of inflammation in left colon, mild chronic colitis Colonoscopy 10/30/2017 - normal ileum, 10-15cm segment of mild inflammation, otherwise normal - mildly active colitis      Past Medical History:  Diagnosis Date  . Allergy   .  Anemia   . Anxiety   . Cellulitis of left breast 09/17/2015   diagnosed at Urgent Care- on ABX  . Clostridium difficile diarrhea   . Clotting disorder ([place])   . Depression    takes Prozac daily  . Hearing loss    right ear  . History of colon polyps    benign  . Hyperlipidemia   . Hypertension    takes Metoprolol daily  . Hypokalemia    takes Potassium every other day  . Inflammatory polyps of colon ([place])   . Internal hemorrhoids   . Mucosal  abnormality of stomach   . Osteopenia   . Osteoporosis   . Personal history of meningioma of the brain   . Platelet disorder ([place])   . Pneumonia    hx of-about 4+ yrs ago  . Seasonal allergies    takes Allegra as needed and Flonase daily  . SVT (supraventricular tachycardia) ([place])   . Tennis elbow   . Ulcerative colitis    On Entyvio q 4 week dosing  . Urinary urgency   . Vitamin B12 deficiency   . Von Willebrands disease [facility])      Past Surgical History:  Procedure Laterality Date  . BAHA REVISION Right 03/15/2016   Procedure: REVISION RIGHT BAHA HEARING IMPLANT ;  Surgeon: Vicie Mutters, MD;  Location: [facility];  Service: ENT;  Laterality: Right;  . BIOPSY  01/24/2019   Procedure: BIOPSY;  Surgeon: Yetta Flock, MD;  Location: [facility];  Service: Gastroenterology;;  . BRAIN MENINGIOMA EXCISION  05/2007  . CARDIAC ELECTROPHYSIOLOGY STUDY AND ABLATION  10/22/09  . COLONOSCOPY N/A 07/29/2013   Procedure: COLONOSCOPY;  Surgeon: Lafayette Dragon, MD;  Location: [facility];  Service: Endoscopy;  Laterality: N/A;  . COLONOSCOPY WITH PROPOFOL N/A 09/21/2015   Procedure: COLONOSCOPY WITH PROPOFOL;  Surgeon: Manus Gunning, MD;  Location: [facility];  Service: Gastroenterology;  Laterality: N/A;  . COLONOSCOPY WITH PROPOFOL N/A 10/30/2017   Procedure: COLONOSCOPY WITH PROPOFOL;  Surgeon: Yetta Flock, MD;  Location: [facility];  Service: Gastroenterology;  Laterality: N/A;  . COLONOSCOPY WITH PROPOFOL N/A 01/24/2019   Procedure: COLONOSCOPY WITH PROPOFOL;  Surgeon: Yetta Flock, MD;  Location: [facility];  Service: Gastroenterology;  Laterality: N/A;  . ESOPHAGOGASTRODUODENOSCOPY    . IMPLANTATION BONE ANCHORED HEARING AID Right   . POLYPECTOMY  01/24/2019   Procedure: POLYPECTOMY;  Surgeon: Yetta Flock, MD;  Location: [facility];  Service: Gastroenterology;;  . Arnetha Courser TOOTH EXTRACTION     Family History  Problem Relation Age of Onset  . Heart  disease Maternal Grandfather   . Heart disease Mother   . Stroke Mother   . Diabetes Maternal Uncle   . Colon cancer Neg Hx   . Breast cancer Neg Hx   . Esophageal cancer Neg Hx   . Rectal cancer Neg Hx   . Stomach cancer Neg Hx    Social History   Tobacco Use  . Smoking status: Never Smoker  . Smokeless tobacco: Never Used  Substance Use Topics  . Alcohol use: No    Alcohol/week: 0.0 standard drinks  . Drug use: No   Current Outpatient Medications  Medication Sig Dispense Refill  . FLUoxetine (PROZAC) 20 MG capsule Take 20 mg by mouth daily 90 capsule 0  . hydrocortisone (ANUSOL-HC) 25 MG suppository Insert 1 suppository rectally once to twice a day for 10 days 15 suppository 1  . hydrocortisone 2.5 % ointment Apply 1 application  topically 2 (two) times daily as needed (for eczema).    . mercaptopurine (PURINETHOL) 50 MG tablet Take 2 tablets (100 mg total) by mouth daily. Give on an empty stomach 1 hour before or 2 hours after meals. Caution: Chemotherapy. (Patient taking differently: Take 100 mg by mouth daily. Give on an empty stomach 1 hour before or 2 hours after meals.Patient takes it at 11 am. Caution: Chemotherapy.) 60 tablet 5  . Meth-Hyo-M Bl-Na Phos-Ph Sal (URIBEL) 118 MG CAPS Take by mouth. 1 three times daily    . metoprolol succinate (TOPROL-XL) 50 MG 24 hr tablet Take 1 tablet (50 mg total) by mouth daily. Take with or immediately following a meal. 90 tablet 0  . ondansetron (ZOFRAN ODT) 4 MG disintegrating tablet Take 1 tablet (4 mg total) by mouth every 6 (six) hours as needed for nausea. 30 tablet 3  . predniSONE (DELTASONE) 10 MG tablet Take 2 tablets (20 mg total) by mouth daily with breakfast. May increase to 30 mg daily if needed to treat flare, then begin taper again when advised 60 tablet 0  . Probiotic Product (PROBIOTIC DAILY PO) Take 1 capsule by mouth daily.    Marland Kitchen STIMATE 1.5 MG/ML SOLN Place 1 spray into both nostrils See admin instructions. Use 1 spray in  each nostril 30-60 minutes prior to procedure  1  . tranexamic acid (LYSTEDA) 650 MG TABS tablet Take 1,300 mg by mouth See admin instructions. Start after procedure, take 1300 mg by mouth 3 times daily for 5 days  2   Current Facility-Administered Medications  Medication Dose Route Frequency Provider Last Rate Last Dose  . desmopressin (DDAVP) 23.6 mcg in sodium chloride 0.9 % IV Syringe  0.3 mcg/kg Intravenous Once Finnick Orosz, Carlota Raspberry, MD       Allergies  Allergen Reactions  . Atenolol Swelling  . Asa [Aspirin]     Platelet disorder   . Cephalexin Other (See Comments)    Upset her colitis Upset her colitis  . Ibuprofen     Platelet disrder  . Nsaids Other (See Comments)    PLATELET DISORDER      Review of Systems: All systems reviewed and negative except where noted in HPI.    Lab Results  Component Value Date   WBC 7.1 03/24/2019   HGB 10.8 (L) 03/24/2019   HCT 32.9 (L) 03/24/2019   MCV 99.7 03/24/2019   PLT 455.0 (H) 03/24/2019    Lab Results  Component Value Date   CREATININE 0.71 06/27/2018   BUN 13 06/27/2018   NA 144 06/27/2018   K 3.7 06/27/2018   CL 103 06/27/2018   CO2 24 06/27/2018    Lab Results  Component Value Date   ALT 35 03/24/2019   AST 19 03/24/2019   ALKPHOS 48 03/24/2019   BILITOT 0.6 03/24/2019     Physical Exam: BP 110/74 (BP Location: Left Arm, Patient Position: Sitting)   Pulse 68   Temp 97.8 F (36.6 C)   Ht 4\' 11"  (1.499 m)   Wt 175 lb (79.4 kg)   LMP 11/26/2008   SpO2 98%   BMI 35.35 kg/m  Constitutional: Pleasant,well-developed, female in no acute distress. HEENT: Normocephalic and atraumatic. Conjunctivae are normal. No scleral icterus. Neck supple.  Cardiovascular: Normal rate, regular rhythm.  Pulmonary/chest: Effort normal and breath sounds normal. No wheezing, rales or rhonchi. Abdominal: Soft, nondistended, nontender.  There are no masses palpable.  Extremities: no edema, left ankle in boot Lymphadenopathy:  No cervical adenopathy  noted. Neurological: Alert and oriented to person place and time. Skin: Skin is warm and dry. No rashes noted. Psychiatric: Normal mood and affect. Behavior is normal.   ASSESSMENT AND PLAN: 58 year old female here for reassessment of the following:  Ulcerative colitis / High risk medication use - as above, the patient has failed anti-TNF (Remicade remotely was stopped due to severe infection, recently failed Humira), Entyvio, thiopurines (toxicity).  Recent colonoscopy showed severe colitis of the left colon but extent of disease is now pancolonic.  She responded to a prednisone taper and has been started on Stelara and appears to be doing pretty well thus far with clinical improvement.  We have discussed Stelara, long-term risks.  Hopefully this works for her.  If not and she continues to flare, other options would be Morrie Sheldon or consider surgical consultation for colectomy.  We discussed those options as well.  Clinically doing well, will continue to taper off steroids.  She is due for some vaccines as outlined below.  I would like to see her every 3-4 months  PLAN: - continue Stelara, due for next dose 9/15 - wean prednisone, due to stop tomorrow - Rowasa enemas - 1 qHS for 2 weeks and then as needed - CBC, CMET, quantiferon gold today - flu shot, PPSV23 today - Shingrix vaccine in the next few weeks (non-live virus vaccine) - I would avoid thiopurines in the future due to hepatotoxicity observed recently, will recheck LFTs - follow up 3-4 months or sooner if any flare of symptoms.   [place] Cellar, MD [facility]

## 2019-05-02 ENCOUNTER — Other Ambulatory Visit: Payer: Self-pay

## 2019-05-03 LAB — QUANTIFERON-TB GOLD PLUS
Mitogen-NIL: >10 IU/mL
NIL: 0.03 IU/mL
QuantiFERON-TB Gold Plus: NEGATIVE
TB1-NIL: <0 IU/mL
TB2-NIL: <0 IU/mL

## 2019-05-16 MED FILL — SHINGRIX 50 MCG SUS: 50 | 1 days supply | Qty: 1 | Fill #0

## 2019-05-21 ENCOUNTER — Ambulatory Visit: Payer: BC Managed Care – PPO

## 2019-05-22 ENCOUNTER — Ambulatory Visit
Admission: RE | Admit: 2019-05-22 | Discharge: 2019-05-22 | Disposition: A | Discharge status: Home / Self Care | Payer: BC Managed Care – PPO | Source: Ambulatory Visit | Attending: Physician Assistant | Admitting: Physician Assistant

## 2019-05-22 ENCOUNTER — Other Ambulatory Visit: Payer: Self-pay

## 2019-05-22 DIAGNOSIS — Z1231 Encounter for screening mammogram for malignant neoplasm of breast: Secondary | ICD-10-CM

## 2019-05-27 ENCOUNTER — Telehealth: Payer: Self-pay

## 2019-05-27 ENCOUNTER — Other Ambulatory Visit: Payer: Self-pay

## 2019-05-27 DIAGNOSIS — K51919 Ulcerative colitis, unspecified with unspecified complications: Secondary | ICD-10-CM

## 2019-05-27 NOTE — Telephone Encounter (Signed)
-----   Message from Hughie Closs, RN sent at 05/02/2019 11:08 AM EDT ----- CBC 1 month F/U - 05/31/19. Put in Epic and call patient

## 2019-05-27 NOTE — Telephone Encounter (Signed)
Called patient and asked her to come in Friday for 1 month F/U CBC. Patient agreed.

## 2019-05-29 ENCOUNTER — Other Ambulatory Visit (INDEPENDENT_AMBULATORY_CARE_PROVIDER_SITE_OTHER): Payer: BC Managed Care – PPO

## 2019-05-29 DIAGNOSIS — K51919 Ulcerative colitis, unspecified with unspecified complications: Secondary | ICD-10-CM

## 2019-05-29 LAB — CBC WITH DIFFERENTIAL/PLATELET
Basophils Absolute: 0 10*3/uL (ref 0.0–0.1)
Basophils Relative: 0.4 % (ref 0.0–3.0)
Eosinophils Absolute: 0.1 10*3/uL (ref 0.0–0.7)
Eosinophils Relative: 1 % (ref 0.0–5.0)
HCT: 38.2 % (ref 36.0–46.0)
Hemoglobin: 12.2 g/dL (ref 12.0–15.0)
Lymphocytes Relative: 38.2 % (ref 12.0–46.0)
Lymphs Abs: 2.5 10*3/uL (ref 0.7–4.0)
MCHC: 32 g/dL (ref 30.0–36.0)
MCV: 86.8 fl (ref 78.0–100.0)
Monocytes Absolute: 0.5 10*3/uL (ref 0.1–1.0)
Monocytes Relative: 8.3 % (ref 3.0–12.0)
Neutro Abs: 3.4 10*3/uL (ref 1.4–7.7)
Neutrophils Relative %: 52.1 % (ref 43.0–77.0)
Platelets: 253 10*3/uL (ref 150.0–400.0)
RBC: 4.39 Mil/uL (ref 3.87–5.11)
RDW: 24.5 % — ABNORMAL HIGH (ref 11.5–15.5)
WBC: 6.6 10*3/uL (ref 4.0–10.5)

## 2019-07-16 MED FILL — SHINGRIX 50 MCG SUS: 50 | 1 days supply | Qty: 1 | Fill #1

## 2019-07-29 ENCOUNTER — Telehealth: Payer: Self-pay

## 2019-07-29 NOTE — Telephone Encounter (Signed)
Called and spoke to pt. Scheduled her for 3rd Twinrix on Dec.15th at 2:00pm.

## 2019-07-31 ENCOUNTER — Telehealth: Payer: Self-pay | Admitting: Gastroenterology

## 2019-07-31 NOTE — Telephone Encounter (Signed)
No I don't think she would need one for at least another 5 years but can discuss it in clinic with her. Thanks

## 2019-07-31 NOTE — Telephone Encounter (Signed)
Dr. Havery Moros - Lauren [name] had the Pneumococcal 13 in 04-2018 and the Pneumococcal 23 in 04-2019.  Does she need another 94? If so, when?

## 2019-08-01 NOTE — Telephone Encounter (Signed)
Left message for patient relaying Dr. Doyne Keel response.  I encouraged her to call back and schedule an office visit if she wished to discuss further.

## 2019-08-12 ENCOUNTER — Other Ambulatory Visit: Payer: Self-pay

## 2019-08-12 ENCOUNTER — Ambulatory Visit (INDEPENDENT_AMBULATORY_CARE_PROVIDER_SITE_OTHER): Payer: BC Managed Care – PPO

## 2019-08-12 DIAGNOSIS — Z23 Encounter for immunization: Secondary | ICD-10-CM

## 2019-08-12 DIAGNOSIS — K51919 Ulcerative colitis, unspecified with unspecified complications: Secondary | ICD-10-CM | POA: Diagnosis not present

## 2019-08-15 MED FILL — SHINGRIX 50 MCG SUS: 50 | 1 days supply | Qty: 1 | Fill #1

## 2019-11-13 ENCOUNTER — Telehealth: Payer: Self-pay

## 2019-11-13 ENCOUNTER — Other Ambulatory Visit: Payer: Self-pay

## 2019-11-13 DIAGNOSIS — K51919 Ulcerative colitis, unspecified with unspecified complications: Secondary | ICD-10-CM

## 2019-11-13 NOTE — Telephone Encounter (Signed)
Called patient and she will come into our lab in the next few days for CBC and CMET. Also scheduled an office visit with Dr. Havery Moros on 12/23/19

## 2019-11-14 ENCOUNTER — Ambulatory Visit: Payer: BC Managed Care – PPO | Attending: Internal Medicine

## 2019-11-14 DIAGNOSIS — Z23 Encounter for immunization: Secondary | ICD-10-CM

## 2019-11-14 NOTE — Progress Notes (Signed)
   Covid-19 Vaccination Clinic  Name:  Lauren [name]    MRN: [id number] DOB: 03-Sep-1960  11/14/2019  Lauren [name] was observed post Covid-19 immunization for 15 minutes without incident. She was provided with Vaccine Information Sheet and instruction to access the V-Safe system.   Lauren [name] was instructed to call 911 with any severe reactions post vaccine: Marland Kitchen Difficulty breathing  . Swelling of face and throat  . A fast heartbeat  . A bad rash all over body  . Dizziness and weakness   Immunizations Administered    Name Date Dose VIS Date Route   Pfizer COVID-19 Vaccine 11/14/2019  3:19 PM 0.3 mL 08/08/2019 Intramuscular   Manufacturer: [place]   Lot: [id number]   [place]: [address]-1

## 2019-11-20 ENCOUNTER — Other Ambulatory Visit (INDEPENDENT_AMBULATORY_CARE_PROVIDER_SITE_OTHER): Payer: BC Managed Care – PPO

## 2019-11-20 DIAGNOSIS — K51919 Ulcerative colitis, unspecified with unspecified complications: Secondary | ICD-10-CM

## 2019-11-20 LAB — COMPREHENSIVE METABOLIC PANEL
ALT: 9 U/L (ref 0–35)
AST: 15 U/L (ref 0–37)
Albumin: 4.2 g/dL (ref 3.5–5.2)
Alkaline Phosphatase: 75 U/L (ref 39–117)
BUN: 18 mg/dL (ref 6–23)
CO2: 31 mEq/L (ref 19–32)
Calcium: 9.5 mg/dL (ref 8.4–10.5)
Chloride: 102 mEq/L (ref 96–112)
Creatinine, Ser: 0.73 mg/dL (ref 0.40–1.20)
GFR: 81.68 mL/min (ref >60.00)
Glucose, Bld: 88 mg/dL (ref 70–99)
Potassium: 3.6 mEq/L (ref 3.5–5.1)
Sodium: 137 mEq/L (ref 135–145)
Total Bilirubin: 0.5 mg/dL (ref 0.2–1.2)
Total Protein: 7.6 g/dL (ref 6.0–8.3)

## 2019-11-20 LAB — CBC WITH DIFFERENTIAL/PLATELET
Basophils Absolute: 0 10*3/uL (ref 0.0–0.1)
Basophils Relative: 0.5 % (ref 0.0–3.0)
Eosinophils Absolute: 0.1 10*3/uL (ref 0.0–0.7)
Eosinophils Relative: 1.2 % (ref 0.0–5.0)
HCT: 41.6 % (ref 36.0–46.0)
Hemoglobin: 13.8 g/dL (ref 12.0–15.0)
Lymphocytes Relative: 48.3 % — ABNORMAL HIGH (ref 12.0–46.0)
Lymphs Abs: 2.7 10*3/uL (ref 0.7–4.0)
MCHC: 33.3 g/dL (ref 30.0–36.0)
MCV: 87.6 fl (ref 78.0–100.0)
Monocytes Absolute: 0.4 10*3/uL (ref 0.1–1.0)
Monocytes Relative: 7.9 % (ref 3.0–12.0)
Neutro Abs: 2.4 10*3/uL (ref 1.4–7.7)
Neutrophils Relative %: 42.1 % — ABNORMAL LOW (ref 43.0–77.0)
Platelets: 215 10*3/uL (ref 150.0–400.0)
RBC: 4.75 Mil/uL (ref 3.87–5.11)
RDW: 14.7 % (ref 11.5–15.5)
WBC: 5.7 10*3/uL (ref 4.0–10.5)

## 2019-12-09 ENCOUNTER — Ambulatory Visit: Payer: BC Managed Care – PPO | Attending: Internal Medicine

## 2019-12-09 DIAGNOSIS — Z23 Encounter for immunization: Secondary | ICD-10-CM

## 2019-12-09 NOTE — Progress Notes (Signed)
   Covid-19 Vaccination Clinic  Name:  LIKISHA ALLES    MRN: [id number] DOB: 1961/01/03  12/09/2019  Ms. Captain was observed post Covid-19 immunization for 15 minutes without incident. She was provided with Vaccine Information Sheet and instruction to access the V-Safe system.   Ms. Parish was instructed to call 911 with any severe reactions post vaccine: Marland Kitchen Difficulty breathing  . Swelling of face and throat  . A fast heartbeat  . A bad rash all over body  . Dizziness and weakness   Immunizations Administered    Name Date Dose VIS Date Route   Pfizer COVID-19 Vaccine 12/09/2019  4:01 PM 0.3 mL 08/08/2019 Intramuscular   Manufacturer: [place]   Lot: [id number]   [place]: [id number]

## 2019-12-23 ENCOUNTER — Encounter: Payer: Self-pay | Admitting: Gastroenterology

## 2019-12-23 ENCOUNTER — Other Ambulatory Visit: Payer: Self-pay

## 2019-12-23 ENCOUNTER — Ambulatory Visit (INDEPENDENT_AMBULATORY_CARE_PROVIDER_SITE_OTHER): Payer: BC Managed Care – PPO | Admitting: Gastroenterology

## 2019-12-23 VITALS — BP 102/70 | HR 72 | Temp 98.1°F | Ht 59.0 in | Wt 164.0 lb

## 2019-12-23 DIAGNOSIS — K51019 Ulcerative (chronic) pancolitis with unspecified complications: Secondary | ICD-10-CM | POA: Diagnosis not present

## 2019-12-23 DIAGNOSIS — D6800 Von Willebrand disease, unspecified: Secondary | ICD-10-CM

## 2019-12-23 MED ORDER — SUTAB 1479-225-188 MG PO TABS
1.0000 | ORAL_TABLET | Freq: Once | ORAL | 0 refills | Status: AC
Start: 2019-12-23 — End: 2019-12-23

## 2019-12-23 NOTE — H&P (View-Only) (Signed)
HPI :  Colitis History Left sided ulcerative colitis, diagnosed> 15years ago. On Remicade in the past for her colitis, which she thinks she did well for a few years. She had a severe MRSA infection of her ear and ultimately Remicade was held and eventually was stopped given she had done well when it was held. She has been on Imuran in the past, she tolerated it well, she thinks it was weaned off given she had been feeling well also. She has never been on Humira or other anti-TNF agents. She has never been on methotrexate. She has been on Lialda for a long time, prior to that Asacol. She has Von Willebrands. She bleeds easily but has never had significant bleeding / hemorrhages in the past. She has had DDAVP infusions prior to her last colonoscopy or Stilmate nose spray. Started on [facility] April 2018 following a few flares that Spring.Failed Entyvio 2019, started Humira and 6MP Feb / March 2020.Failed Humira / 6 MP combination therapy 12/2018. Hepatoxicity to thiopurines - 6MMP levels > 50K. Transitioned to Stelara monotherapy 02/2019. Now PANCOLONIC colitis.   SINCE LAST VISIT:  The patient was last seen in September.  Since that time she is been on Stelara monotherapy and states she has been doing really well.  She has been tolerating medication every 8 weeks.  Denies any flares of symptoms since that time.  She has some occasional mild urgency but otherwise is mostly back to normal and feels that she is mostly in remission.  She has multiple bowel movements per day which often very based on her diet.  No blood in the stool.  No abdominal pains.  No nausea or vomiting.  She is not been using Rowasa at all. She has not been on 6-MP due to hepatotoxicity in the past.  Since her last visit she received her flu shot, PPSV 23 vaccine as well as Shingrix vaccine.  She is also got her Covid vaccine.   IBD Health Care Maintenance: Annual Flu Vaccine - Date due Pneumococcal Vaccine if receiving  immunosuppression: - Date PCV 13 - 05/21/2018, PPSV 23 05/01/19 Zoster vaccine i12/18/20, 05/17/19 TB testing if on anti-TNF, yearly - Date 2020 - negative Vitamin D screening - Date Last Colonoscopy - Colonoscopy 01/24/2019 - severe left sided inflammation, mild in transverse and right colon, normal ileum. Benign inflammatory polyp of the cecum  Endoscopic history: Colonoscopy 2014 - active rectal inflammation, inflammatory polyp Colonoscopy 09/21/2015 - overall good control of colitis with small mild area of inflammation in left colon, mild chronic colitis Colonoscopy 10/30/2017 - normal ileum, 10-15cm segment of mild inflammation, otherwise normal - mildly active colitis  Colonoscopy 01/24/19 - The examined portion of the ileum was normal. - Active inflammation as described - severe in the left colon, and mild in the right and transverse colon - extent of inflammatory change has progressed (previously left sided only). Biopsied in right, left, transverse colon - rule out CMV given recent imunosuppression. - One 4 mm polyp in the cecum, removed with a cold snare. Resected and retrieved. - The examination was otherwise normal.       Past Medical History:  Diagnosis Date  . Allergy   . Anemia   . Anxiety   . Cellulitis of left breast 09/17/2015   diagnosed at Urgent Care- on ABX  . Clostridium difficile diarrhea   . Clotting disorder ([place])   . Depression    takes Prozac daily  . Hearing loss    right ear  .  History of colon polyps    benign  . Hyperlipidemia   . Hypertension    takes Metoprolol daily  . Hypokalemia    takes Potassium every other day  . Inflammatory polyps of colon ([place])   . Internal hemorrhoids   . Mucosal abnormality of stomach   . Osteopenia   . Osteoporosis   . Personal history of meningioma of the brain   . Platelet disorder ([place])   . Pneumonia    hx of-about 4+ yrs ago  . Seasonal allergies    takes Allegra as needed and Flonase daily  . SVT  (supraventricular tachycardia) ([place])   . Tennis elbow   . Ulcerative colitis    On Entyvio q 4 week dosing  . Urinary urgency   . Vitamin B12 deficiency   . Von Willebrands disease [facility])      Past Surgical History:  Procedure Laterality Date  . BAHA REVISION Right 03/15/2016   Procedure: REVISION RIGHT BAHA HEARING IMPLANT ;  Surgeon: Vicie Mutters, MD;  Location: [place];  Service: ENT;  Laterality: Right;  . BIOPSY  01/24/2019   Procedure: BIOPSY;  Surgeon: Yetta Flock, MD;  [facility];  Service: Gastroenterology;;  . BRAIN MENINGIOMA EXCISION  05/2007  . CARDIAC ELECTROPHYSIOLOGY STUDY AND ABLATION  10/22/09  . COLONOSCOPY N/A 07/29/2013   Procedure: COLONOSCOPY;  Surgeon: Lafayette Dragon, MD;  [facility];  Service: Endoscopy;  Laterality: N/A;  . COLONOSCOPY WITH PROPOFOL N/A 09/21/2015   Procedure: COLONOSCOPY WITH PROPOFOL;  Surgeon: Manus Gunning, MD;  [facility];  Service: Gastroenterology;  Laterality: N/A;  . COLONOSCOPY WITH PROPOFOL N/A 10/30/2017   Procedure: COLONOSCOPY WITH PROPOFOL;  Surgeon: Yetta Flock, MD;  [facility];  Service: Gastroenterology;  Laterality: N/A;  . COLONOSCOPY WITH PROPOFOL N/A 01/24/2019   Procedure: COLONOSCOPY WITH PROPOFOL;  Surgeon: Yetta Flock, MD;  [facility];  Service: Gastroenterology;  Laterality: N/A;  . ESOPHAGOGASTRODUODENOSCOPY    . IMPLANTATION BONE ANCHORED HEARING AID Right   . POLYPECTOMY  01/24/2019   Procedure: POLYPECTOMY;  Surgeon: Yetta Flock, MD;  [facility];  Service: Gastroenterology;;  . Arnetha Courser TOOTH EXTRACTION     Family History  Problem Relation Age of Onset  . Heart disease Maternal Grandfather   . Heart disease Mother   . Stroke Mother   . Diabetes Maternal Uncle   . Colon cancer Neg Hx   . Breast cancer Neg Hx   . Esophageal cancer Neg Hx   . Rectal cancer Neg Hx   . Stomach cancer Neg Hx    Social  History   Tobacco Use  . Smoking status: Never Smoker  . Smokeless tobacco: Never Used  Substance Use Topics  . Alcohol use: No    Alcohol/week: 0.0 standard drinks  . Drug use: No   Current Outpatient Medications  Medication Sig Dispense Refill  . FLUoxetine (PROZAC) 20 MG capsule Take 20 mg by mouth daily 90 capsule 0  . hydrocortisone (ANUSOL-HC) 25 MG suppository Insert 1 suppository rectally once to twice a day for 10 days 15 suppository 1  . hydrocortisone 2.5 % ointment Apply 1 application topically 2 (two) times daily as needed (for eczema).    . mesalamine (ROWASA) 4 g enema Place 60 mLs (4 g total) rectally at bedtime. Then as needed thereafter 1260 mL 2  . Meth-Hyo-M Bl-Na Phos-Ph Sal (URIBEL) 118 MG CAPS Take by mouth. 1 three times daily    .  metoprolol succinate (TOPROL-XL) 50 MG 24 hr tablet Take 1 tablet (50 mg total) by mouth daily. Take with or immediately following a meal. 90 tablet 0  . ondansetron (ZOFRAN ODT) 4 MG disintegrating tablet Take 1 tablet (4 mg total) by mouth every 6 (six) hours as needed for nausea. 30 tablet 3  . Probiotic Product (PROBIOTIC DAILY PO) Take 1 capsule by mouth daily.    Marland Kitchen STIMATE 1.5 MG/ML SOLN Place 1 spray into both nostrils See admin instructions. Use 1 spray in each nostril 30-60 minutes prior to procedure  1  . tranexamic acid (LYSTEDA) 650 MG TABS tablet Take 1,300 mg by mouth See admin instructions. Start after procedure, take 1300 mg by mouth 3 times daily for 5 days  2  . Ustekinumab 45 MG/0.5ML SOLN Inject into the skin every 8 (eight) weeks.     Current Facility-Administered Medications  Medication Dose Route Frequency Provider Last Rate Last Admin  . desmopressin (DDAVP) 23.6 mcg in sodium chloride 0.9 % IV Syringe  0.3 mcg/kg Intravenous Once Adom Schoeneck, Carlota Raspberry, MD       Allergies  Allergen Reactions  . Atenolol Swelling  . Asa [Aspirin]     Platelet disorder   . Cephalexin Other (See Comments)    Upset her  colitis Upset her colitis  . Ibuprofen     Platelet disrder  . Nsaids Other (See Comments)    PLATELET DISORDER      Review of Systems: All systems reviewed and negative except where noted in HPI.   Lab Results  Component Value Date   WBC 5.7 11/20/2019   HGB 13.8 11/20/2019   HCT 41.6 11/20/2019   MCV 87.6 11/20/2019   PLT 215.0 11/20/2019    Lab Results  Component Value Date   CREATININE 0.73 11/20/2019   BUN 18 11/20/2019   NA 137 11/20/2019   K 3.6 11/20/2019   CL 102 11/20/2019   CO2 31 11/20/2019    Lab Results  Component Value Date   ALT 9 11/20/2019   AST 15 11/20/2019   ALKPHOS 75 11/20/2019   BILITOT 0.5 11/20/2019     Physical Exam: BP 102/70   Pulse 72   Temp 98.1 F (36.7 C)   Ht 4' 11"  (1.499 m)   Wt 164 lb (74.4 kg)   LMP 11/26/2008   BMI 33.12 kg/m  Constitutional: Pleasant,well-developed, female in no acute distress. Neurological: Alert and oriented to person place and time. Skin: Skin is warm and dry. No rashes noted. Psychiatric: Normal mood and affect. Behavior is normal.   ASSESSMENT AND PLAN: 59 year old female here for reassessment of the following:  Ulcerative colitis / Von Willebrand Disease - the patient has previously failed Remicade (stopped due to severe infection), Humira, Entyvio, and had hepatotoxicity due to thiopurine's.  Her last colonoscopy showed a severe colitis without evidence of CMV or infection.  We transitioned her to Stelara and fortunately she has had a very nice response and clinically doing quite well with normal labs as of a few weeks ago.  We discussed long-term regimen, risks and benefits of her current regimen.  She has been vaccinated to COVID-19, pneumonia, shingles.  Now that she has been on Stelara for several months, I feel surveillance colonoscopy is warranted to assess for remission and perform routine surveillance for dysplasia etc. in light of her longstanding colitis.  We discussed risk and  benefits of colonoscopy and this needs to be done at the hospital given her von Willebrand's  disease and risk for hemorrhage.  We will await recommendations from her hematologist on the regimen given to her preprocedure.  This will be scheduled in the upcoming weeks at the hospital.  Further recommendations pending the results.  Hopefully if she continues to do well on this regimen and colitis is in remission we will continue it.  If she is not doing well we may check Stelara levels.  Other options if she fails Stelara would be Xeljanz or surgical consult for colectomy however she wants to avoid those options if possible.  [place] Cellar, MD [facility] Gastroenterology

## 2019-12-23 NOTE — Patient Instructions (Addendum)
If you are age 59 or older, your body mass index should be between 23-30. Your Body mass index is 33.12 kg/m. If this is out of the aforementioned range listed, please consider follow up with your Primary Care Provider.  If you are age 26 or younger, your body mass index should be between 19-25. Your Body mass index is 33.12 kg/m. If this is out of the aformentioned range listed, please consider follow up with your Primary Care Provider.   You have been scheduled for a colonoscopy. Please follow written instructions given to you at your visit today.  Please pick up your prep supplies at the pharmacy within the next 1-3 days. If you use inhalers (even only as needed), please bring them with you on the day of your procedure.  We will contact your Hematologist about addressing your Von Willibrands for your procedure.   Thank you for entrusting me with your care and for choosing [facility], Dr. Anderson Cellar

## 2019-12-23 NOTE — Progress Notes (Signed)
HPI :  Colitis History Left sided ulcerative colitis, diagnosed> 15years ago. On Remicade in the past for her colitis, which she thinks she did well for a few years. She had a severe MRSA infection of her ear and ultimately Remicade was held and eventually was stopped given she had done well when it was held. She has been on Imuran in the past, she tolerated it well, she thinks it was weaned off given she had been feeling well also. She has never been on Humira or other anti-TNF agents. She has never been on methotrexate. She has been on Lialda for a long time, prior to that Asacol. She has Von Willebrands. She bleeds easily but has never had significant bleeding / hemorrhages in the past. She has had DDAVP infusions prior to her last colonoscopy or Stilmate nose spray. Started on [facility] April 2018 following a few flares that Spring.Failed Entyvio 2019, started Humira and 6MP Feb / March 2020.Failed Humira / 6 MP combination therapy 12/2018. Hepatoxicity to thiopurines - 6MMP levels > 50K. Transitioned to Stelara monotherapy 02/2019. Now PANCOLONIC colitis.   SINCE LAST VISIT:  The patient was last seen in September.  Since that time she is been on Stelara monotherapy and states she has been doing really well.  She has been tolerating medication every 8 weeks.  Denies any flares of symptoms since that time.  She has some occasional mild urgency but otherwise is mostly back to normal and feels that she is mostly in remission.  She has multiple bowel movements per day which often very based on her diet.  No blood in the stool.  No abdominal pains.  No nausea or vomiting.  She is not been using Rowasa at all. She has not been on 6-MP due to hepatotoxicity in the past.  Since her last visit she received her flu shot, PPSV 23 vaccine as well as Shingrix vaccine.  She is also got her Covid vaccine.   IBD Health Care Maintenance: Annual Flu Vaccine - Date due Pneumococcal Vaccine if receiving  immunosuppression: - Date PCV 13 - 05/21/2018, PPSV 23 05/01/19 Zoster vaccine i12/18/20, 05/17/19 TB testing if on anti-TNF, yearly - Date 2020 - negative Vitamin D screening - Date Last Colonoscopy - Colonoscopy 01/24/2019 - severe left sided inflammation, mild in transverse and right colon, normal ileum. Benign inflammatory polyp of the cecum  Endoscopic history: Colonoscopy 2014 - active rectal inflammation, inflammatory polyp Colonoscopy 09/21/2015 - overall good control of colitis with small mild area of inflammation in left colon, mild chronic colitis Colonoscopy 10/30/2017 - normal ileum, 10-15cm segment of mild inflammation, otherwise normal - mildly active colitis  Colonoscopy 01/24/19 - The examined portion of the ileum was normal. - Active inflammation as described - severe in the left colon, and mild in the right and transverse colon - extent of inflammatory change has progressed (previously left sided only). Biopsied in right, left, transverse colon - rule out CMV given recent imunosuppression. - One 4 mm polyp in the cecum, removed with a cold snare. Resected and retrieved. - The examination was otherwise normal.       Past Medical History:  Diagnosis Date  . Allergy   . Anemia   . Anxiety   . Cellulitis of left breast 09/17/2015   diagnosed at Urgent Care- on ABX  . Clostridium difficile diarrhea   . Clotting disorder ([place])   . Depression    takes Prozac daily  . Hearing loss    right ear  .  History of colon polyps    benign  . Hyperlipidemia   . Hypertension    takes Metoprolol daily  . Hypokalemia    takes Potassium every other day  . Inflammatory polyps of colon ([place])   . Internal hemorrhoids   . Mucosal abnormality of stomach   . Osteopenia   . Osteoporosis   . Personal history of meningioma of the brain   . Platelet disorder ([place])   . Pneumonia    hx of-about 4+ yrs ago  . Seasonal allergies    takes Allegra as needed and Flonase daily  . SVT  (supraventricular tachycardia) ([place])   . Tennis elbow   . Ulcerative colitis    On Entyvio q 4 week dosing  . Urinary urgency   . Vitamin B12 deficiency   . Von Willebrands disease [facility])      Past Surgical History:  Procedure Laterality Date  . BAHA REVISION Right 03/15/2016   Procedure: REVISION RIGHT BAHA HEARING IMPLANT ;  Surgeon: Vicie Mutters, MD;  Location: [place];  Service: ENT;  Laterality: Right;  . BIOPSY  01/24/2019   Procedure: BIOPSY;  Surgeon: Yetta Flock, MD;  [facility];  Service: Gastroenterology;;  . BRAIN MENINGIOMA EXCISION  05/2007  . CARDIAC ELECTROPHYSIOLOGY STUDY AND ABLATION  10/22/09  . COLONOSCOPY N/A 07/29/2013   Procedure: COLONOSCOPY;  Surgeon: Lafayette Dragon, MD;  [facility];  Service: Endoscopy;  Laterality: N/A;  . COLONOSCOPY WITH PROPOFOL N/A 09/21/2015   Procedure: COLONOSCOPY WITH PROPOFOL;  Surgeon: Manus Gunning, MD;  [facility];  Service: Gastroenterology;  Laterality: N/A;  . COLONOSCOPY WITH PROPOFOL N/A 10/30/2017   Procedure: COLONOSCOPY WITH PROPOFOL;  Surgeon: Yetta Flock, MD;  [facility];  Service: Gastroenterology;  Laterality: N/A;  . COLONOSCOPY WITH PROPOFOL N/A 01/24/2019   Procedure: COLONOSCOPY WITH PROPOFOL;  Surgeon: Yetta Flock, MD;  [facility];  Service: Gastroenterology;  Laterality: N/A;  . ESOPHAGOGASTRODUODENOSCOPY    . IMPLANTATION BONE ANCHORED HEARING AID Right   . POLYPECTOMY  01/24/2019   Procedure: POLYPECTOMY;  Surgeon: Yetta Flock, MD;  [facility];  Service: Gastroenterology;;  . Arnetha Courser TOOTH EXTRACTION     Family History  Problem Relation Age of Onset  . Heart disease Maternal Grandfather   . Heart disease Mother   . Stroke Mother   . Diabetes Maternal Uncle   . Colon cancer Neg Hx   . Breast cancer Neg Hx   . Esophageal cancer Neg Hx   . Rectal cancer Neg Hx   . Stomach cancer Neg Hx    Social  History   Tobacco Use  . Smoking status: Never Smoker  . Smokeless tobacco: Never Used  Substance Use Topics  . Alcohol use: No    Alcohol/week: 0.0 standard drinks  . Drug use: No   Current Outpatient Medications  Medication Sig Dispense Refill  . FLUoxetine (PROZAC) 20 MG capsule Take 20 mg by mouth daily 90 capsule 0  . hydrocortisone (ANUSOL-HC) 25 MG suppository Insert 1 suppository rectally once to twice a day for 10 days 15 suppository 1  . hydrocortisone 2.5 % ointment Apply 1 application topically 2 (two) times daily as needed (for eczema).    . mesalamine (ROWASA) 4 g enema Place 60 mLs (4 g total) rectally at bedtime. Then as needed thereafter 1260 mL 2  . Meth-Hyo-M Bl-Na Phos-Ph Sal (URIBEL) 118 MG CAPS Take by mouth. 1 three times daily    .  metoprolol succinate (TOPROL-XL) 50 MG 24 hr tablet Take 1 tablet (50 mg total) by mouth daily. Take with or immediately following a meal. 90 tablet 0  . ondansetron (ZOFRAN ODT) 4 MG disintegrating tablet Take 1 tablet (4 mg total) by mouth every 6 (six) hours as needed for nausea. 30 tablet 3  . Probiotic Product (PROBIOTIC DAILY PO) Take 1 capsule by mouth daily.    Marland Kitchen STIMATE 1.5 MG/ML SOLN Place 1 spray into both nostrils See admin instructions. Use 1 spray in each nostril 30-60 minutes prior to procedure  1  . tranexamic acid (LYSTEDA) 650 MG TABS tablet Take 1,300 mg by mouth See admin instructions. Start after procedure, take 1300 mg by mouth 3 times daily for 5 days  2  . Ustekinumab 45 MG/0.5ML SOLN Inject into the skin every 8 (eight) weeks.     Current Facility-Administered Medications  Medication Dose Route Frequency Provider Last Rate Last Admin  . desmopressin (DDAVP) 23.6 mcg in sodium chloride 0.9 % IV Syringe  0.3 mcg/kg Intravenous Once Armbruster, Carlota Raspberry, MD       Allergies  Allergen Reactions  . Atenolol Swelling  . Asa [Aspirin]     Platelet disorder   . Cephalexin Other (See Comments)    Upset her colitis  Upset her colitis  . Ibuprofen     Platelet disrder  . Nsaids Other (See Comments)    PLATELET DISORDER      Review of Systems: All systems reviewed and negative except where noted in HPI.   Lab Results  Component Value Date   WBC 5.7 11/20/2019   HGB 13.8 11/20/2019   HCT 41.6 11/20/2019   MCV 87.6 11/20/2019   PLT 215.0 11/20/2019    Lab Results  Component Value Date   CREATININE 0.73 11/20/2019   BUN 18 11/20/2019   NA 137 11/20/2019   K 3.6 11/20/2019   CL 102 11/20/2019   CO2 31 11/20/2019    Lab Results  Component Value Date   ALT 9 11/20/2019   AST 15 11/20/2019   ALKPHOS 75 11/20/2019   BILITOT 0.5 11/20/2019     Physical Exam: BP 102/70   Pulse 72   Temp 98.1 F (36.7 C)   Ht 4' 11"  (1.499 m)   Wt 164 lb (74.4 kg)   LMP 11/26/2008   BMI 33.12 kg/m  Constitutional: Pleasant,well-developed, female in no acute distress. Neurological: Alert and oriented to person place and time. Skin: Skin is warm and dry. No rashes noted. Psychiatric: Normal mood and affect. Behavior is normal.   ASSESSMENT AND PLAN: 59 year old female here for reassessment of the following:  Ulcerative colitis / Von Willebrand Disease - the patient has previously failed Remicade (stopped due to severe infection), Humira, Entyvio, and had hepatotoxicity due to thiopurine's.  Her last colonoscopy showed a severe colitis without evidence of CMV or infection.  We transitioned her to Stelara and fortunately she has had a very nice response and clinically doing quite well with normal labs as of a few weeks ago.  We discussed long-term regimen, risks and benefits of her current regimen.  She has been vaccinated to COVID-19, pneumonia, shingles.  Now that she has been on Stelara for several months, I feel surveillance colonoscopy is warranted to assess for remission and perform routine surveillance for dysplasia etc. in light of her longstanding colitis.  We discussed risk and benefits of  colonoscopy and this needs to be done at the hospital given her von Willebrand's  disease and risk for hemorrhage.  We will await recommendations from her hematologist on the regimen given to her preprocedure.  This will be scheduled in the upcoming weeks at the hospital.  Further recommendations pending the results.  Hopefully if she continues to do well on this regimen and colitis is in remission we will continue it.  If she is not doing well we may check Stelara levels.  Other options if she fails Stelara would be Xeljanz or surgical consult for colectomy however she wants to avoid those options if possible.  [place] Cellar, MD [facility] Gastroenterology

## 2019-12-24 ENCOUNTER — Telehealth: Payer: Self-pay | Admitting: Gastroenterology

## 2019-12-24 ENCOUNTER — Telehealth: Payer: Self-pay

## 2019-12-24 NOTE — Telephone Encounter (Signed)
[facility] Hematology at [phone] (Nurse line) and left a detailed message to be called back for instructions for pt to have DDAVP desmopressin prior to colonoscopy at [facility] on 01-15-20 due to [place] disease.

## 2019-12-25 MED ORDER — TRANEXAMIC ACID 650 MG PO TABS: 1300.0000 mg | ORAL_TABLET | Freq: Three times a day (TID) | ORAL | 0 refills | Status: AC

## 2019-12-25 NOTE — Telephone Encounter (Signed)
Received faxed instructions from [place]. (see below)  Called and spoke to pt and let her know we have received instructions and will communicate with the Endo Unit at [facility].  Patient appreciated being informed.   "DDAVP 0.72mg/kg IV 30-60 minutes before procedure to be given by anesthesia with usual fluid precautions. Tranexamic acid 2 tablets (1300 mg) by mouth 3 times a day for 5 days after colonoscopy if biopsies are taken. Patient to obtain medication from local [facility] and bring with her on day of procedure.  Normal Saline (NS)  IV fluids preferred over LR for prevention of hyponatremia during IV for procedures with DDAVP is used. [facility] the [facility] [place] [place] or [place]: [phone] #2 for nursing."  Tranexamic acid tablets sent to [facility]

## 2019-12-26 NOTE — Progress Notes (Addendum)
Call received from [name] at [place]. Patient is scheduled for colonoscopy 01/15/20 with Dr. Havery Moros at [place]. Patient needs DDAVP, orders from hemophilia clinic entered in [place]. Patient also needs normal saline IV fluids instead of LR per hemophilia clinic for risk of hyponatremia. Hemophilia Clinic at [facility]: [phone] option 2. Emergency number:  [phone]. Physician orders from [facility] sent to scanning by [place] GI office.

## 2019-12-26 NOTE — Telephone Encounter (Signed)
[name] thanks so much for your help with this

## 2019-12-26 NOTE — Telephone Encounter (Signed)
Called and spoke to [place] at the [employer].  She entered the order from [facility] for DDAVP 0.69mg/kg IV 30 to 60 minutes [facility]  She made note of NS preferred over LR for prevention of hyponatremia.  [facility] (as Physician orders for 01-15-20 colonoscopy) and she will reference in her note. Confirmed patient should arrive at the normal 1.5 hours prior to procedure start time. Patient notified to pick up oral prescription of Tranexamic and take to hospital for day of procedure.   Patient confirmed understanding.

## 2020-01-12 ENCOUNTER — Other Ambulatory Visit (HOSPITAL_COMMUNITY)
Admission: RE | Admit: 2020-01-12 | Discharge: 2020-01-12 | Disposition: A | Discharge status: Home / Self Care | Payer: BC Managed Care – PPO | Source: Ambulatory Visit | Attending: Gastroenterology | Admitting: Gastroenterology

## 2020-01-12 DIAGNOSIS — Z20822 Contact with and (suspected) exposure to covid-19: Secondary | ICD-10-CM | POA: Insufficient documentation

## 2020-01-12 DIAGNOSIS — Z01812 Encounter for preprocedural laboratory examination: Secondary | ICD-10-CM | POA: Insufficient documentation

## 2020-01-13 LAB — SARS CORONAVIRUS 2 (TAT 6-24 HRS): SARS Coronavirus 2: NEGATIVE

## 2020-01-15 ENCOUNTER — Encounter (HOSPITAL_COMMUNITY): Payer: Self-pay | Admitting: Gastroenterology

## 2020-01-15 ENCOUNTER — Ambulatory Visit (HOSPITAL_COMMUNITY): Payer: BC Managed Care – PPO | Admitting: Certified Registered Nurse Anesthetist

## 2020-01-15 ENCOUNTER — Ambulatory Visit (HOSPITAL_COMMUNITY)
Admission: RE | Admit: 2020-01-15 | Discharge: 2020-01-15 | Disposition: A | Discharge status: Home / Self Care | Payer: BC Managed Care – PPO | Attending: Gastroenterology | Admitting: Gastroenterology

## 2020-01-15 ENCOUNTER — Other Ambulatory Visit: Payer: Self-pay

## 2020-01-15 ENCOUNTER — Encounter (HOSPITAL_COMMUNITY)
Admission: RE | Disposition: A | Discharge status: Home / Self Care | Payer: Self-pay | Source: Home / Self Care | Attending: Gastroenterology

## 2020-01-15 DIAGNOSIS — K51019 Ulcerative (chronic) pancolitis with unspecified complications: Secondary | ICD-10-CM

## 2020-01-15 DIAGNOSIS — F329 Major depressive disorder, single episode, unspecified: Secondary | ICD-10-CM | POA: Insufficient documentation

## 2020-01-15 DIAGNOSIS — M1990 Unspecified osteoarthritis, unspecified site: Secondary | ICD-10-CM | POA: Diagnosis not present

## 2020-01-15 DIAGNOSIS — F419 Anxiety disorder, unspecified: Secondary | ICD-10-CM | POA: Insufficient documentation

## 2020-01-15 DIAGNOSIS — Z79899 Other long term (current) drug therapy: Secondary | ICD-10-CM | POA: Diagnosis not present

## 2020-01-15 DIAGNOSIS — K5100 Ulcerative (chronic) pancolitis without complications: Secondary | ICD-10-CM | POA: Diagnosis not present

## 2020-01-15 DIAGNOSIS — D6800 Von Willebrand disease, unspecified: Secondary | ICD-10-CM

## 2020-01-15 DIAGNOSIS — Z886 Allergy status to analgesic agent status: Secondary | ICD-10-CM | POA: Insufficient documentation

## 2020-01-15 DIAGNOSIS — K5150 Left sided colitis without complications: Secondary | ICD-10-CM

## 2020-01-15 DIAGNOSIS — Z1211 Encounter for screening for malignant neoplasm of colon: Secondary | ICD-10-CM | POA: Insufficient documentation

## 2020-01-15 DIAGNOSIS — I10 Essential (primary) hypertension: Secondary | ICD-10-CM | POA: Diagnosis not present

## 2020-01-15 DIAGNOSIS — E785 Hyperlipidemia, unspecified: Secondary | ICD-10-CM | POA: Insufficient documentation

## 2020-01-15 DIAGNOSIS — Z86010 Personal history of colonic polyps: Secondary | ICD-10-CM | POA: Diagnosis not present

## 2020-01-15 DIAGNOSIS — Z881 Allergy status to other antibiotic agents status: Secondary | ICD-10-CM | POA: Insufficient documentation

## 2020-01-15 DIAGNOSIS — D126 Benign neoplasm of colon, unspecified: Secondary | ICD-10-CM | POA: Diagnosis not present

## 2020-01-15 HISTORY — PX: COLONOSCOPY WITH PROPOFOL: SHX5780

## 2020-01-15 HISTORY — PX: POLYPECTOMY: SHX5525

## 2020-01-15 HISTORY — PX: BIOPSY: SHX5522

## 2020-01-15 SURGERY — COLONOSCOPY WITH PROPOFOL
Anesthesia: Monitor Anesthesia Care

## 2020-01-15 MED ORDER — SODIUM CHLORIDE 0.9 % IV SOLN
INTRAVENOUS | Status: DC
  Administered 2020-01-15: 500 mL via INTRAVENOUS

## 2020-01-15 MED ORDER — PROPOFOL 500 MG/50ML IV EMUL
INTRAVENOUS | Status: DC | PRN
  Administered 2020-01-15: 150 ug/kg/min via INTRAVENOUS

## 2020-01-15 MED ORDER — PROPOFOL 500 MG/50ML IV EMUL
INTRAVENOUS | Status: AC
  Filled 2020-01-15: qty 50

## 2020-01-15 MED ORDER — PROPOFOL 10 MG/ML IV BOLUS
INTRAVENOUS | Status: AC
  Filled 2020-01-15: qty 20

## 2020-01-15 MED ORDER — PROPOFOL 1000 MG/100ML IV EMUL
INTRAVENOUS | Status: AC
  Filled 2020-01-15: qty 100

## 2020-01-15 MED ORDER — PROPOFOL 500 MG/50ML IV EMUL
INTRAVENOUS | Status: DC | PRN
  Administered 2020-01-15 (×2): 20 mg via INTRAVENOUS

## 2020-01-15 MED ORDER — SODIUM CHLORIDE 0.9 % IV SOLN
23.0000 ug | Freq: Once | INTRAVENOUS | Status: AC
  Administered 2020-01-15: 23 ug via INTRAVENOUS
  Filled 2020-01-15: qty 5.75

## 2020-01-15 SURGICAL SUPPLY — 21 items

## 2020-01-15 NOTE — Transfer of Care (Signed)
Immediate Anesthesia Transfer of Care Note  Patient: Lauren [name]  Procedure(s) Performed: COLONOSCOPY WITH PROPOFOL (N/A ) BIOPSY  Patient Location: PACU  Anesthesia Type:MAC  Level of Consciousness: awake, alert  and oriented  Airway & Oxygen Therapy: Patient Spontanous Breathing and Patient connected to face mask oxygen  Post-op Assessment: Report given to RN and Post -op Vital signs reviewed and stable  Post vital signs: Reviewed and stable  Last Vitals:  Vitals Value Taken Time  BP    Temp    Pulse 73 01/15/20 1417  Resp 20 01/15/20 1417  SpO2 100 % 01/15/20 1417  Vitals shown include unvalidated device data.  Last Pain:  Vitals:   01/15/20 1202  TempSrc: Oral  PainSc: 0-No pain         Complications: No apparent anesthesia complications

## 2020-01-15 NOTE — Interval H&P Note (Signed)
History and Physical Interval Note:  01/15/2020 12:58 PM  Lauren [name]  has presented today for surgery, with the diagnosis of UC, Von Willibrands.  The various methods of treatment have been discussed with the patient and family. After consideration of risks, benefits and other options for treatment, the patient has consented to  Procedure(s) with comments: COLONOSCOPY WITH PROPOFOL (N/A) - needs DDAVP 30-60 minutes prior to procedure. as a surgical intervention.  The patient's history has been reviewed, patient examined, no change in status, stable for surgery.  I have reviewed the patient's chart and labs.  Questions were answered to the patient's satisfaction.     [place]

## 2020-01-15 NOTE — Interval H&P Note (Signed)
History and Physical Interval Note: Patient here for colonoscopy for UC, assess response to Stelara. She is receiving DDAVP infusion pre-op per Hematology and plans to take regimen following the procedure to decrease risk of peri-operative bleeding. All questions answered she wishes to proceed.    01/15/2020 12:59 PM  Lauren [name]  has presented today for surgery, with the diagnosis of UC, Von Willibrands.  The various methods of treatment have been discussed with the patient and family. After consideration of risks, benefits and other options for treatment, the patient has consented to  Procedure(s) with comments: COLONOSCOPY WITH PROPOFOL (N/A) - needs DDAVP 30-60 minutes prior to procedure. as a surgical intervention.  The patient's history has been reviewed, patient examined, no change in status, stable for surgery.  I have reviewed the patient's chart and labs.  Questions were answered to the patient's satisfaction.     [place]

## 2020-01-15 NOTE — Op Note (Addendum)
[facility] Patient Name: Lauren [name] Procedure Date: 01/15/2020 MRN: [id number] Attending MD: Willaim Rayas. Adela Lank , MD Date of Birth: [name] 27, 1961 CSN: [id number] Age: 59 Admit Type: Outpatient Procedure:                Colonoscopy Indications:              High risk colon cancer surveillance: Ulcerative                            pancolitis - failed mesalamine / Remicade / Humira                            / Entyvio / thiopurines - now on Stelara with                            improvement of symptoms. History of Von                            Willebrands, given DDAVP pre-procedure Providers:                Viviann Spare P. Adela Lank, MD, Dwain Sarna, RN,                            Kandice Robinsons, Technician Referring MD:              Medicines:                Monitored Anesthesia Care Complications:            No immediate complications. Estimated blood loss:                            Minimal. Estimated Blood Loss:     Estimated blood loss was minimal. Procedure:                Pre-Anesthesia Assessment:                           - Prior to the procedure, a History and Physical                            was performed, and patient medications and                            allergies were reviewed. The patient's tolerance of                            previous anesthesia was also reviewed. The risks                            and benefits of the procedure and the sedation                            options and risks were discussed with the patient.                            All  questions were answered, and informed consent                            was obtained. Prior Anticoagulants: The patient has                            taken no previous anticoagulant or antiplatelet                            agents. ASA Grade Assessment: III - A patient with                            severe systemic disease. After reviewing the risks                            and benefits,  the patient was deemed in                            satisfactory condition to undergo the procedure.                           After obtaining informed consent, the colonoscope                            was passed under direct vision. Throughout the                            procedure, the patient's blood pressure, pulse, and                            oxygen saturations were monitored continuously. The                            PCF-H190DL ([id number] Olympus pediatric colonscope                            was introduced through the anus and advanced to the                            the terminal ileum, with identification of the                            appendiceal orifice and IC valve. The colonoscopy                            was performed without difficulty. The patient                            tolerated the procedure well. The quality of the                            bowel preparation was adequate. The terminal ileum,  ileocecal valve, appendiceal orifice, and rectum                            were photographed. Scope In: 1:43:03 PM Scope Out: 2:08:02 PM Scope Withdrawal Time: 0 hours 20 minutes 30 seconds  Total Procedure Duration: 0 hours 24 minutes 59 seconds  Findings:      The perianal and digital rectal examinations were normal.      The terminal ileum appeared normal.      A 3 to 4 mm polyp was found in the sigmoid colon. The polyp was sessile.       The polyp was removed with a cold snare. Resection and retrieval were       complete.      A 3 mm polyp was found in the recto-sigmoid colon. The polyp was       sessile. The polyp was removed with a cold biopsy forceps. Resection and       retrieval were complete.      Mild inflammation was found in a continuous and circumferential pattern       from the rectum to the rectosigmoid colon (roughly 20cm total in       length). This was graded as Mayo Score 1 (mild, with erythema, decreased        vascular pattern, mild friability), and when compared to the previous       examination, the findings are improved. Biopsies were taken with a cold       forceps for histology.      The exam was otherwise normal throughout the examined colon. No other       active inflammation appreciated. Retroflexed views not obtained given       rectal inflammation.      Biopsies were taken with a cold forceps from the right colon, left       colon, transverse colon, sigmoid colon and rectum. These biopsy       specimens were sent to Pathology. Impression:               - The examined portion of the ileum was normal.                           - One 3 to 4 mm polyp in the sigmoid colon, removed                            with a cold snare. Resected and retrieved.                           - One 3 mm polyp at the recto-sigmoid colon,                            removed with a cold biopsy forceps. Resected and                            retrieved.                           - Mild (Mayo Score 1) ulcerative colitis of the  distal 20cm, otherwise no other inflammatory                            changes noted. Overall, significantly improved                            since the last examination. Biopsied.                           - Biopsies for surveillance were taken from the                            right colon, left colon, transverse colon, and                            rectum. Moderate Sedation:      No moderate sedation, case performed with MAC Recommendation:           - Patient has a contact number available for                            emergencies. The signs and symptoms of potential                            delayed complications were discussed with the                            patient. Return to normal activities tomorrow.                            Written discharge instructions were provided to the                            patient.                           -  Resume previous diet.                           - Continue present medications.                           - Start transexamic acid per Hematology protocol to                            reduce risk for post procedure bleeding                           - Await pathology results.                           - Recommend checking Stelara levels                           - Would try adding hydrocortisone enemas vs. Rowasa  given mild proctitis, will discuss with the patient                           - Check BMET tomorrow in light of DDAVP use, ensure                            Na stable Procedure Code(s):        --- Professional ---                           [phone], Colonoscopy, flexible; with removal of                            tumor(s), polyp(s), or other lesion(s) by snare                            technique                           45380, 59, Colonoscopy, flexible; with biopsy,                            single or multiple Diagnosis Code(s):        --- Professional ---                           K51.00, Ulcerative (chronic) pancolitis without                            complications                           K63.5, Polyp of colon CPT copyright 2019 American Medical Association. All rights reserved. The codes documented in this report are preliminary and upon coder review may  be revised to meet current compliance requirements. Viviann Spare P. Uchenna Rappaport, MD 01/15/2020 2:19:35 PM This report has been signed electronically. Number of Addenda: 0

## 2020-01-15 NOTE — Discharge Instructions (Signed)

## 2020-01-15 NOTE — Anesthesia Postprocedure Evaluation (Signed)
Anesthesia Post Note  Patient: Lauren [name]  Procedure(s) Performed: COLONOSCOPY WITH PROPOFOL (N/A ) BIOPSY POLYPECTOMY     Patient location during evaluation: Endoscopy Anesthesia Type: MAC Level of consciousness: awake and alert, oriented and patient cooperative Pain management: pain level controlled Vital Signs Assessment: post-procedure vital signs reviewed and stable Respiratory status: spontaneous breathing, nonlabored ventilation and respiratory function stable Cardiovascular status: blood pressure returned to baseline and stable Postop Assessment: no apparent nausea or vomiting and able to ambulate Anesthetic complications: no    Last Vitals:  Vitals:   01/15/20 1415 01/15/20 1425  BP: (!) 133/56 (!) 142/80  Pulse: 71 71  Resp: 14 16  Temp: 36.4 C   SpO2: 100% 100%    Last Pain:  Vitals:   01/15/20 1425  TempSrc:   PainSc: 0-No pain                 Evie Crumpler,E. Stassi Fadely

## 2020-01-15 NOTE — Anesthesia Preprocedure Evaluation (Addendum)
Anesthesia Evaluation  Patient identified by MRN, date of birth, ID band Patient awake    Reviewed: Allergy & Precautions, NPO status , Patient's Chart, lab work & pertinent test results, reviewed documented beta blocker date and time   History of Anesthesia Complications Negative for: history of anesthetic complications  Airway Mallampati: I  TM Distance: >3 FB Neck ROM: Full    Dental  (+) Dental Advisory Given   Pulmonary neg pulmonary ROS,  01/12/2020 SARS coronavirus NEG   breath sounds clear to auscultation       Cardiovascular hypertension, Pt. on medications and Pt. on home beta blockers (-) angina+ dysrhythmias Supra Ventricular Tachycardia  Rhythm:Regular Rate:Normal     Neuro/Psych negative neurological ROS     GI/Hepatic negative GI ROS, Neg liver ROS,   Endo/Other  negative endocrine ROS  Renal/GU negative Renal ROS     Musculoskeletal  (+) Arthritis ,   Abdominal   Peds  Hematology Von Willebrand's type platelet defect: hematology  ordering DDAVP   Anesthesia Other Findings   Reproductive/Obstetrics                            Anesthesia Physical Anesthesia Plan  ASA: III  Anesthesia Plan: MAC   Post-op Pain Management:    Induction:   PONV Risk Score and Plan: 2 and Treatment may vary due to age or medical condition  Airway Management Planned: Natural Airway and Simple Face Mask  Additional Equipment: None  Intra-op Plan:   Post-operative Plan:   Informed Consent: I have reviewed the patients History and Physical, chart, labs and discussed the procedure including the risks, benefits and alternatives for the proposed anesthesia with the patient or authorized representative who has indicated his/her understanding and acceptance.     Dental advisory given  Plan Discussed with: CRNA and Surgeon  Anesthesia Plan Comments:        Anesthesia Quick  Evaluation

## 2020-01-16 ENCOUNTER — Encounter: Payer: Self-pay | Admitting: *Deleted

## 2020-01-16 ENCOUNTER — Other Ambulatory Visit: Payer: Self-pay | Admitting: Gastroenterology

## 2020-01-16 ENCOUNTER — Other Ambulatory Visit (INDEPENDENT_AMBULATORY_CARE_PROVIDER_SITE_OTHER): Payer: BC Managed Care – PPO

## 2020-01-16 DIAGNOSIS — K51019 Ulcerative (chronic) pancolitis with unspecified complications: Secondary | ICD-10-CM

## 2020-01-16 DIAGNOSIS — E871 Hypo-osmolality and hyponatremia: Secondary | ICD-10-CM

## 2020-01-16 LAB — BASIC METABOLIC PANEL
BUN: 13 mg/dL (ref 6–23)
CO2: 27 mEq/L (ref 19–32)
Calcium: 9.1 mg/dL (ref 8.4–10.5)
Chloride: 97 mEq/L (ref 96–112)
Creatinine, Ser: 0.6 mg/dL (ref 0.40–1.20)
GFR: 102.36 mL/min (ref >60.00)
Glucose, Bld: 104 mg/dL — ABNORMAL HIGH (ref 70–99)
Potassium: 3.2 mEq/L — ABNORMAL LOW (ref 3.5–5.1)
Sodium: 130 mEq/L — ABNORMAL LOW (ref 135–145)

## 2020-01-16 LAB — SURGICAL PATHOLOGY

## 2020-01-19 ENCOUNTER — Other Ambulatory Visit: Payer: Self-pay

## 2020-01-19 ENCOUNTER — Other Ambulatory Visit (INDEPENDENT_AMBULATORY_CARE_PROVIDER_SITE_OTHER): Payer: BC Managed Care – PPO

## 2020-01-19 DIAGNOSIS — E871 Hypo-osmolality and hyponatremia: Secondary | ICD-10-CM | POA: Diagnosis not present

## 2020-01-19 DIAGNOSIS — K51019 Ulcerative (chronic) pancolitis with unspecified complications: Secondary | ICD-10-CM

## 2020-01-19 LAB — BASIC METABOLIC PANEL
BUN: 18 mg/dL (ref 6–23)
CO2: 30 mEq/L (ref 19–32)
Calcium: 9.4 mg/dL (ref 8.4–10.5)
Chloride: 101 mEq/L (ref 96–112)
Creatinine, Ser: 0.69 mg/dL (ref 0.40–1.20)
GFR: 87.11 mL/min (ref >60.00)
Glucose, Bld: 83 mg/dL (ref 70–99)
Potassium: 3.7 mEq/L (ref 3.5–5.1)
Sodium: 138 mEq/L (ref 135–145)

## 2020-01-19 MED ORDER — MESALAMINE 4 G RE ENEM: 4.0000 g | ENEMA | Freq: Every day | RECTAL | 1 refills | Status: DC

## 2020-03-02 ENCOUNTER — Other Ambulatory Visit: Payer: Self-pay

## 2020-03-02 ENCOUNTER — Telehealth: Payer: Self-pay | Admitting: Gastroenterology

## 2020-03-02 ENCOUNTER — Other Ambulatory Visit (INDEPENDENT_AMBULATORY_CARE_PROVIDER_SITE_OTHER): Payer: BC Managed Care – PPO

## 2020-03-02 DIAGNOSIS — K51019 Ulcerative (chronic) pancolitis with unspecified complications: Secondary | ICD-10-CM

## 2020-03-02 LAB — POTASSIUM: Potassium: 3.8 mEq/L (ref 3.5–5.1)

## 2020-03-02 NOTE — Telephone Encounter (Signed)
Patient came and picked up the kit.  New orders placed

## 2020-03-10 ENCOUNTER — Telehealth: Payer: Self-pay | Admitting: Gastroenterology

## 2020-03-10 MED ORDER — USTEKINUMAB 45 MG/0.5ML ~~LOC~~ SOLN: SUBCUTANEOUS | 11 refills | Status: DC

## 2020-03-10 NOTE — Addendum Note (Signed)
Addended by: Marlon Pel on: 03/10/2020 02:59 PM   Modules accepted: Orders

## 2020-03-10 NOTE — Telephone Encounter (Signed)
Stelara levels returned - done 03/02/20 - her level is 0.63 ug/ml and no antibodies. Her dosing is subtherapeutic (level < 1). Would recommend we increase her Stelara dosing to every 4 weeks if her insurance will approve it.  Barbera Setters can you see if her insurance will approve Stelara every 4 weeks? Thanks

## 2020-03-10 NOTE — Telephone Encounter (Signed)
Patient notified of the plan .  Rx sent for increase to her pharmacy.  She is advised to remain on the q 8 weeks until we hear from the insurance company.

## 2020-03-23 ENCOUNTER — Other Ambulatory Visit: Payer: Self-pay

## 2020-03-23 MED ORDER — STELARA 90 MG/ML ~~LOC~~ SOSY: 90.0000 mg | PREFILLED_SYRINGE | SUBCUTANEOUS | 11 refills | Status: DC

## 2020-04-09 ENCOUNTER — Telehealth: Payer: Self-pay | Admitting: Gastroenterology

## 2020-04-09 NOTE — Telephone Encounter (Signed)
[employer] meds called to follow up on a request from the pharmacy for [facility]. Please advise case # [id number]

## 2020-04-09 NOTE — Telephone Encounter (Signed)
Spoke with Teagan from [place] [employer], she stated that prior authorization was denied and could be appealed. She stated that patient would need to fill out the letter of consent before the appeal could be initiated. Printed form for patient to complete. Will contact patient to have her fill out form, once form is complete we will need to fax or upload into cover my meds Appeal Key - [id number]

## 2020-04-12 NOTE — Telephone Encounter (Addendum)
Patient contacted to sign the authorization form for appeal.  Patient states with recent dietary changes she is 50% better.  She is not sure at this point that she wants to continue the q 4 week injections for Stelara.  She is worried that there will be additional unwanted side effects with increasing her dose.  She has her next injection in a few weeks and wants to see how she does.  She plans to contact the insurance company and find out how long she has to decide on the appeal for the Stelara increase.  She wants to know if Dr. Havery Moros would like to have an inperson visit or a phone visit to discuss further if he feels strongly about the increase in dosage.   I will keep appeal packet at my desk until additional decisions are made.

## 2020-04-12 NOTE — Telephone Encounter (Signed)
Her trough level is 0.6 and it is supposed to be > 1. She had some mild active inflammation on her colonoscopy. I think dose escalation if it is covered should be done, at this point given her levels are low she is getting some risk of the medication without all of the benefit. If she really doesn't want to do this, it is up to her, but long term I think she will do better on higher dose. If she wants to hold off let me know and we can book next routine opening in office to discuss it further. Thanks

## 2020-04-12 NOTE — Telephone Encounter (Signed)
Dr. Havery Moros.  Q 4 week Stelara has been denied.  Please see appeal form in your in basket.  Please see the request for review section and sign the bottom.

## 2020-04-12 NOTE — Telephone Encounter (Signed)
Patient notified She will come in and discuss on 06/18/20

## 2020-04-12 NOTE — Telephone Encounter (Signed)
Thanks Sheri, I signed the form and it is in the [place] on my desk

## 2020-05-17 ENCOUNTER — Telehealth: Payer: Self-pay | Admitting: Gastroenterology

## 2020-05-17 NOTE — Telephone Encounter (Signed)
Spoke with Aldona Bar from [place] [employer], she called to get an update on appeal status. Advised that we are holding off right now as patient would like to discuss further with Dr. Havery Moros before making a decision, advised that her appt is 06/18/20. She stated that they will call back a little after patient's appointment to follow up.

## 2020-05-27 ENCOUNTER — Other Ambulatory Visit: Payer: Self-pay | Admitting: Physician Assistant

## 2020-05-27 DIAGNOSIS — Z1231 Encounter for screening mammogram for malignant neoplasm of breast: Secondary | ICD-10-CM

## 2020-06-11 ENCOUNTER — Ambulatory Visit: Payer: BC Managed Care – PPO

## 2020-06-18 ENCOUNTER — Telehealth: Payer: Self-pay

## 2020-06-18 ENCOUNTER — Other Ambulatory Visit (INDEPENDENT_AMBULATORY_CARE_PROVIDER_SITE_OTHER): Payer: BC Managed Care – PPO

## 2020-06-18 ENCOUNTER — Encounter: Payer: Self-pay | Admitting: Gastroenterology

## 2020-06-18 ENCOUNTER — Ambulatory Visit (INDEPENDENT_AMBULATORY_CARE_PROVIDER_SITE_OTHER): Payer: BC Managed Care – PPO | Admitting: Gastroenterology

## 2020-06-18 ENCOUNTER — Other Ambulatory Visit: Payer: Self-pay

## 2020-06-18 VITALS — BP 104/70 | HR 62 | Ht 59.0 in | Wt 137.4 lb

## 2020-06-18 DIAGNOSIS — K51019 Ulcerative (chronic) pancolitis with unspecified complications: Secondary | ICD-10-CM

## 2020-06-18 DIAGNOSIS — Z79899 Other long term (current) drug therapy: Secondary | ICD-10-CM

## 2020-06-18 DIAGNOSIS — Z23 Encounter for immunization: Secondary | ICD-10-CM

## 2020-06-18 LAB — COMPREHENSIVE METABOLIC PANEL
ALT: 7 U/L (ref 0–35)
AST: 14 U/L (ref 0–37)
Albumin: 4 g/dL (ref 3.5–5.2)
Alkaline Phosphatase: 67 U/L (ref 39–117)
BUN: 12 mg/dL (ref 6–23)
CO2: 33 mEq/L — ABNORMAL HIGH (ref 19–32)
Calcium: 9.2 mg/dL (ref 8.4–10.5)
Chloride: 102 mEq/L (ref 96–112)
Creatinine, Ser: 0.79 mg/dL (ref 0.40–1.20)
GFR: 81.94 mL/min (ref >60.00)
Glucose, Bld: 59 mg/dL — ABNORMAL LOW (ref 70–99)
Potassium: 3.8 mEq/L (ref 3.5–5.1)
Sodium: 141 mEq/L (ref 135–145)
Total Bilirubin: 0.5 mg/dL (ref 0.2–1.2)
Total Protein: 7.3 g/dL (ref 6.0–8.3)

## 2020-06-18 LAB — CBC WITH DIFFERENTIAL/PLATELET
Basophils Absolute: 0 10*3/uL (ref 0.0–0.1)
Basophils Relative: 0.6 % (ref 0.0–3.0)
Eosinophils Absolute: 0.1 10*3/uL (ref 0.0–0.7)
Eosinophils Relative: 1.7 % (ref 0.0–5.0)
HCT: 40.3 % (ref 36.0–46.0)
Hemoglobin: 13.5 g/dL (ref 12.0–15.0)
Lymphocytes Relative: 29.4 % (ref 12.0–46.0)
Lymphs Abs: 1.8 10*3/uL (ref 0.7–4.0)
MCHC: 33.5 g/dL (ref 30.0–36.0)
MCV: 87.5 fl (ref 78.0–100.0)
Monocytes Absolute: 0.5 10*3/uL (ref 0.1–1.0)
Monocytes Relative: 8.7 % (ref 3.0–12.0)
Neutro Abs: 3.6 10*3/uL (ref 1.4–7.7)
Neutrophils Relative %: 59.6 % (ref 43.0–77.0)
Platelets: 254 10*3/uL (ref 150.0–400.0)
RBC: 4.61 Mil/uL (ref 3.87–5.11)
RDW: 14.2 % (ref 11.5–15.5)
WBC: 6.1 10*3/uL (ref 4.0–10.5)

## 2020-06-18 LAB — VITAMIN D 25 HYDROXY (VIT D DEFICIENCY, FRACTURES): VITD: 11.69 ng/mL — ABNORMAL LOW (ref 30.00–100.00)

## 2020-06-18 MED ORDER — STELARA 90 MG/ML ~~LOC~~ SOSY: 90.0000 mg | PREFILLED_SYRINGE | SUBCUTANEOUS | 11 refills | Status: DC

## 2020-06-18 NOTE — Telephone Encounter (Signed)
-----   Message from Yetta Flock, MD sent at 06/18/2020  1:22 PM EDT ----- Regarding: approval for stelara dosing increase Hi Orie Cuttino, This patient's Stelara level is subtherapeutic/low, and she would benefit from increasing her Stelara to every 4 weeks dosing.  Can you please order this and see if her insurance will cover this?  We may need to appeal it if they deny it but I think will have a good chance of getting it approved given her course to date and her levels that were drawn recently.  Thanks

## 2020-06-18 NOTE — Progress Notes (Signed)
HPI :  Colitis History Left sided ulcerative colitis, diagnosed> 15years ago. On Remicade in the past for her colitis, which she thinks she did well for a few years. She had a severe MRSA infection of her ear and ultimately Remicade was held and eventually was stopped given she had done well when it was held. She has been on Imuran in the past, she tolerated it well, she thinks it was weaned off given she had been feeling well also. She has never been on Humira or other anti-TNF agents. She has never been on methotrexate. She has been on Lialda for a long time, prior to that Asacol. She has Von Willebrands. She bleeds easily but has never had significant bleeding / hemorrhages in the past. She has had DDAVP infusions prior to her last colonoscopy or Stilmate nose spray. Started on [facility] April 2018 following a few flares that Spring.Failed Entyvio 2019, started Humira and 6MP Feb / March 2020.Failed Humira / 6 MP combination therapy 12/2018. Hepatoxicity to thiopurines - 6MMP levels > 50K. Transitioned to Stelara monotherapy 02/2019. Now PANCOLONIC colitis.   SINCE LAST VISIT:  The patient is here for a follow-up visit for her colitis.  Since her last office visit we performed a colonoscopy at the hospital in May to reassess her colitis.  She had some mild active inflammation in the left colon, otherwise the rest of her colon looked okay.  We subsequently checked Stelara levels and her level was subtherapeutic at 0.63 without antibodies.  I had recommended adding Rowasa enemas and escalating Stelara dosing to every 4 weeks based on her levels and endoscopic findings.  Initially insurance did not cover dose escalation we are going to file an appeal however the patient had wanted to hold off and see how things went.  She is having dosing every 8 weeks, due for next dose tomorrow.  She is tolerating it well.  She has been seeing a nutritionist and avoiding some trigger foods which she states has  helped her symptoms but she still having about 3-4 bowel movements a day occasional loose stools, formed can vary, no overt bleeding at this time.  She is not really been using Rowasa too much.  She has lost 30 to 40 pounds over the past year, states has been doing so with dieting and has been happy about this but wants to make sure were not missing anything else in regards to her weight loss.  We discussed long-term options.  She is vaccinated to Covid, due for flu shot today.  She has not been on 6-MP due to hepatotoxicity in the past.  IBD Health Care Maintenance: Annual Flu Vaccine- Date due: 2021 due Pneumococcal Vaccineif receiving immunosuppression: - Date PCV 13 - 05/21/2018, PPSV 23 05/01/19 Zoster vaccinei12/18/20, 05/17/19 TB testingif on anti-TNF, yearly - Date 2020 - negative Vitamin Dscreening - Date Last Colonoscopy :   Colonoscopy 01/15/20 - The examined portion of the ileum was normal. - One 3 to 4 mm polyp in the sigmoid colon, removed with a cold snare. Resected and retrieved. - One 3 mm polyp at the recto-sigmoid colon, removed with a cold biopsy forceps. Resected and retrieved. - Mild (Mayo Score 1) ulcerative colitis of the distal 20cm, otherwise no other inflammatory changes noted. Overall, significantly improved since the last examination. Biopsied. - Biopsies for surveillance were taken from the right colon, left colon, transverse colon, and Rectum.  FINAL MICROSCOPIC DIAGNOSIS:   A. COLON, RUGHT, BIOPSY:  - Chronic colitis  -  No active inflammation, granulomas, or malignancy identified   B. COLON, TRANSVERSE, BIOPSY:  - Chronic colitis  - No active inflammation, granulomas, or malignancy identified   C. COLON, LEFT, BIOPSY:  - Chronic colitis  - No active inflammation, granulomas, or malignancy identified   D. COLON, SIGMOID, BIOPSY:  - Chronic colitis  - No active inflammation, granulomas, or malignancy identified   E. COLON, SIGMOID  RECTAL, BIOPSY:  - Chronic mildly active colitis  - No granulomata, dysplasia or malignancy identified   F. COLON, RECTAL, BIOPSY:  - Chronic mildly active colitis  - No granulomata, dysplasia or malignancy identified    Colonoscopy 01/24/2019 -severe left sided inflammation, mild in transverse and right colon, normal ileum. Benign inflammatory polyp of the cecum  Colonoscopy 2014 - active rectal inflammation, inflammatory polyp Colonoscopy 09/21/2015 - overall good control of colitis with small mild area of inflammation in left colon, mild chronic colitis Colonoscopy 10/30/2017 - normal ileum, 10-15cm segment of mild inflammation, otherwise normal - mildly active colitis   Past Medical History:  Diagnosis Date  . Allergy   . Anemia   . Anxiety   . Cellulitis of left breast 09/17/2015   diagnosed at Urgent Care- on ABX  . Clostridium difficile diarrhea   . Clotting disorder ([place])   . Depression    takes Prozac daily  . Hearing loss    right ear  . History of colon polyps    benign  . Hyperlipidemia   . Hypertension    takes Metoprolol daily  . Hypokalemia    takes Potassium every other day  . Inflammatory polyps of colon ([place])   . Internal hemorrhoids   . Mucosal abnormality of stomach   . Osteopenia   . Osteoporosis   . Personal history of meningioma of the brain   . 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 Pneumonia    hx of-about 4+ yrs ago  . Seasonal allergies    takes Allegra as needed and Flonase daily  . SVT (supraventricular tachycardia) ([place])   . Tennis elbow   . Ulcerative colitis    On Entyvio q 4 week dosing  . Urinary urgency   . Vitamin B12 deficiency   . Von Willebrands disease [facility])      Past Surgical History:  Procedure Laterality Date  . BAHA REVISION Right 03/15/2016   Procedure: REVISION RIGHT BAHA HEARING IMPLANT ;  Surgeon: Vicie Mutters, MD;  Location: [place];  Service: ENT;  Laterality: Right;  . BIOPSY  01/24/2019   Procedure: BIOPSY;  Surgeon:  Yetta Flock, MD;  [facility];  Service: Gastroenterology;;  . BIOPSY  01/15/2020   Procedure: BIOPSY;  Surgeon: Yetta Flock, MD;  Location: [facility];  Service: Gastroenterology;;  . BRAIN MENINGIOMA EXCISION  05/2007  . CARDIAC ELECTROPHYSIOLOGY STUDY AND ABLATION  10/22/09  . COLONOSCOPY N/A 07/29/2013   Procedure: COLONOSCOPY;  Surgeon: Lafayette Dragon, MD;  Location: [facility];  Service: Endoscopy;  Laterality: N/A;  . COLONOSCOPY WITH PROPOFOL N/A 09/21/2015   Procedure: COLONOSCOPY WITH PROPOFOL;  Surgeon: Manus Gunning, MD;  Location: [facility];  Service: Gastroenterology;  Laterality: N/A;  . COLONOSCOPY WITH PROPOFOL N/A 10/30/2017   Procedure: COLONOSCOPY WITH PROPOFOL;  Surgeon: Yetta Flock, MD;  Location: [facility];  Service: Gastroenterology;  Laterality: N/A;  . COLONOSCOPY WITH PROPOFOL N/A 01/24/2019   Procedure: COLONOSCOPY WITH PROPOFOL;  Surgeon: Yetta Flock, MD;  Location: [facility];  Service: Gastroenterology;  Laterality: N/A;  .  COLONOSCOPY WITH PROPOFOL N/A 01/15/2020   Procedure: COLONOSCOPY WITH PROPOFOL;  Surgeon: Yetta Flock, MD;  Location: [facility];  Service: Gastroenterology;  Laterality: N/A;  needs DDAVP 30-60 minutes prior to procedure.  . ESOPHAGOGASTRODUODENOSCOPY    . IMPLANTATION BONE ANCHORED HEARING AID Right   . POLYPECTOMY  01/24/2019   Procedure: POLYPECTOMY;  Surgeon: Yetta Flock, MD;  [facility];  Service: Gastroenterology;;  . POLYPECTOMY  01/15/2020   Procedure: POLYPECTOMY;  Surgeon: Yetta Flock, MD;  Location[facility];  Service: Gastroenterology;;  . Arnetha Courser TOOTH EXTRACTION     Family History  Problem Relation Age of Onset  . Heart disease Maternal Grandfather   . Heart disease Mother   . Stroke Mother   . Diabetes Maternal Uncle   . Colon cancer Neg Hx   . Breast cancer Neg Hx   . Esophageal cancer Neg Hx   . Rectal cancer Neg Hx    . Stomach cancer Neg Hx    Social History   Tobacco Use  . Smoking status: Never Smoker  . Smokeless tobacco: Never Used  Vaping Use  . Vaping Use: Never used  Substance Use Topics  . Alcohol use: No    Alcohol/week: 0.0 standard drinks  . Drug use: No   Current Outpatient Medications  Medication Sig Dispense Refill  . clotrimazole-betamethasone (LOTRISONE) cream Apply 1 application topically as needed.    Marland Kitchen FLUoxetine (PROZAC) 20 MG capsule Take 20 mg by mouth daily 90 capsule 0  . hydrocortisone 2.5 % ointment Apply 1 application topically 2 (two) times daily as needed (for eczema).    . mesalamine (ROWASA) 4 g enema Place 60 mLs (4 g total) rectally at bedtime for 30 doses. Then as needed thereafter 1800 mL 1  . metoprolol succinate (TOPROL-XL) 50 MG 24 hr tablet Take 1 tablet (50 mg total) by mouth daily. Take with or immediately following a meal. 90 tablet 0  . STIMATE 1.5 MG/ML SOLN Place 1 spray into both nostrils See admin instructions. Use 1 spray in each nostril 30-60 minutes prior to procedure  1  . ustekinumab (STELARA) 90 MG/ML SOSY injection Inject 1 mL (90 mg total) into the skin every 28 (twenty-eight) days. 1 mL 11   Current Facility-Administered Medications  Medication Dose Route Frequency Provider Last Rate Last Admin  . desmopressin (DDAVP) 23.6 mcg in sodium chloride 0.9 % IV Syringe  0.3 mcg/kg Intravenous Once Tytionna Cloyd, Carlota Raspberry, MD       Allergies  Allergen Reactions  . Atenolol Swelling  . Asa [Aspirin]     Platelet disorder   . Cephalexin Other (See Comments)    Upset her colitis Upset her colitis  . Ibuprofen     Platelet disrder  . Mercaptopurine Hives and Itching    Elevated liver levels  . Nsaids Other (See Comments)    PLATELET DISORDER      Review of Systems: All systems reviewed and negative except where noted in HPI.     Physical Exam: BP 104/70   Pulse 62   Ht 4' 11"  (1.499 m)   Wt 137 lb 6.4 oz (62.3 kg)   LMP 11/26/2008    SpO2 99%   BMI 27.75 kg/m  Constitutional: Pleasant,well-developed, female in no acute distress. Abdominal: Soft, nondistended, nontender.  There are no masses palpable. No hepatomegaly. Extremities: no edema Lymphadenopathy: No cervical adenopathy noted. Neurological: Alert and oriented to person place and time. Skin: Skin is warm and dry. No rashes  noted. Psychiatric: Normal mood and affect. Behavior is normal.   ASSESSMENT AND PLAN: 59 year old female here for reassessment of the following:  Ulcerative colitis / high risk medication use - as above, patient previously has failed Remicade (stopped due to severe infection), Humira, Entyvio, and had hepatotoxicity due to thiopurines.  She was transitioned to [facility] and has had an generally good response to the regiment and doing better.  She still had some mild symptoms and underwent a surveillance colonoscopy in May 2021 which showed some mildly active colitis in her left colon.  Her levels of Stelara were subtherapeutic (<1) without antibodies.  We discussed long-term options.  Given her mild active inflammation on colonoscopy and failure of other regimens, I do think she would benefit from escalation of dose of Stelara to do our best to get her in remission and make Steleara levels therapeutic.  We discussed risks and benefits of doing this.  She has been vaccinated to COVID-19, she is due for flu shot which she will get today.  Following this discussion, if her insurance will approve it we will increase Stelara to every 4 weeks.  If she fails high dose Stelara her options will be Lourena Simmonds, or surgical consult for colectomy.  She is otherwise due for basic blood work today, will send CBC, c-Met, vitamin D, and screen for tuberculosis.  I will see her back in 3 to 4 months to assess response and we will talk about doing fecal calprotectin at that time, and timing of her next colonoscopy.  All questions answered, no she has flaring of any  symptoms in the interim she will contact us.  Otherwise I think her weight loss is probably from her dieting which she is doing a great job with, however if this continues or fails to plateau, or any other concerning findings would recommend imaging to further evaluate.  She agreed  [place] Cellar, MD [facility]

## 2020-06-18 NOTE — Patient Instructions (Addendum)
Normal BMI (Body Mass Index- based on height and weight) is between 19 and 25. Your BMI today is Body mass index is 27.75 kg/m. Marland Kitchen Please consider follow up  regarding your BMI with your Primary Care Provider.  Please go to the lab in the basement of our building to have lab work done as you leave today. Hit "B" for basement when you get on the elevator.  When the doors open the lab is on your left.  We will call you with the results. Thank you.  Due to recent changes in healthcare laws, you may see the results of your imaging and laboratory studies on MyChart before your provider has had a chance to review them.  We understand that in some cases there may be results that are confusing or concerning to you. Not all laboratory results come back in the same time frame and the provider may be waiting for multiple results in order to interpret others.  Please give [place] 48 hours in order for your provider to thoroughly review all the results before contacting the office for clarification of your results.   We are giving you a flu shot today.  We will contact you about Stelara every 28 days.  Please follow up in 4 months.  We will remind you when it is time to schedule.  Thank you for entrusting me with your care and for choosing [facility], Dr. Hiko Cellar

## 2020-06-18 NOTE — Telephone Encounter (Signed)
New RX for Stelara 90 mg every 4 weeks has been sent to [facility] for review and determination of coverage. Will await response

## 2020-06-20 LAB — QUANTIFERON-TB GOLD PLUS
Mitogen-NIL: >10 IU/mL
NIL: 0.02 IU/mL
QuantiFERON-TB Gold Plus: NEGATIVE
TB1-NIL: 0.01 IU/mL
TB2-NIL: 0.01 IU/mL

## 2020-06-25 ENCOUNTER — Ambulatory Visit
Admission: RE | Admit: 2020-06-25 | Discharge: 2020-06-25 | Disposition: A | Discharge status: Home / Self Care | Payer: BC Managed Care – PPO | Source: Ambulatory Visit | Attending: Physician Assistant | Admitting: Physician Assistant

## 2020-06-25 ENCOUNTER — Other Ambulatory Visit: Payer: Self-pay

## 2020-06-25 DIAGNOSIS — Z1231 Encounter for screening mammogram for malignant neoplasm of breast: Secondary | ICD-10-CM

## 2020-07-02 ENCOUNTER — Other Ambulatory Visit: Payer: Self-pay

## 2020-07-02 DIAGNOSIS — K51019 Ulcerative (chronic) pancolitis with unspecified complications: Secondary | ICD-10-CM

## 2020-07-02 MED ORDER — STELARA 90 MG/ML ~~LOC~~ SOSY: 90.0000 mg | PREFILLED_SYRINGE | SUBCUTANEOUS | 11 refills | Status: DC

## 2020-07-02 NOTE — Telephone Encounter (Signed)
Lauren [name] from [facility] is requesting a call back from a nurse, caller is checking on the status of the pt's [facility] pre authorization.  CB [phone] Key: [id number]

## 2020-07-02 NOTE — Telephone Encounter (Signed)
Returned call to Cover My Meds, spoke with Aldona Bar, advised that we will not be needing assistance with prior authorization for Stelara, we have sent RX to specialty pharmacy.

## 2020-07-11 ENCOUNTER — Other Ambulatory Visit: Payer: Self-pay

## 2020-07-11 ENCOUNTER — Ambulatory Visit
Admission: EM | Admit: 2020-07-11 | Discharge: 2020-07-11 | Disposition: A | Discharge status: Home / Self Care | Payer: BC Managed Care – PPO | Attending: Emergency Medicine | Admitting: Emergency Medicine

## 2020-07-11 DIAGNOSIS — Z20822 Contact with and (suspected) exposure to covid-19: Secondary | ICD-10-CM

## 2020-07-12 LAB — NOVEL CORONAVIRUS, NAA: SARS-CoV-2, NAA: NOT DETECTED

## 2020-07-12 LAB — SARS-COV-2, NAA 2 DAY TAT

## 2020-07-16 ENCOUNTER — Telehealth: Payer: Self-pay

## 2020-07-16 NOTE — Telephone Encounter (Signed)
Received fax from [facility] with approval for Stelara 27m every 28 days  Reference number: [id number]  Effective dates of authorization: 07/14/2020 - 07/13/2021  Left detailed message on patient's vm letting her know that her insurance has approved the Stelara prescription for every 28 days, advised her to give me a call if she had any questions.

## 2020-07-20 ENCOUNTER — Other Ambulatory Visit: Payer: Self-pay

## 2020-07-20 ENCOUNTER — Ambulatory Visit
Admission: EM | Admit: 2020-07-20 | Discharge: 2020-07-20 | Disposition: A | Discharge status: Home / Self Care | Payer: BC Managed Care – PPO | Attending: Emergency Medicine | Admitting: Emergency Medicine

## 2020-07-20 DIAGNOSIS — Z1152 Encounter for screening for COVID-19: Secondary | ICD-10-CM

## 2020-07-20 NOTE — ED Triage Notes (Signed)
Pt needs retesting due to health department test resulting positive however demographic info didn't match hers

## 2020-07-21 LAB — NOVEL CORONAVIRUS, NAA: SARS-CoV-2, NAA: NOT DETECTED

## 2020-07-21 LAB — SARS-COV-2, NAA 2 DAY TAT

## 2020-08-03 ENCOUNTER — Telehealth: Payer: Self-pay | Admitting: Gastroenterology

## 2020-08-03 NOTE — Telephone Encounter (Signed)
Last Vitamin D was drawn 06/18/20, okay to order? Patient is ready to have repeat labs.

## 2020-08-03 NOTE — Telephone Encounter (Signed)
It's a bit early to draw the vitamin D level, I would typically prefer another month or so from now on her regimen to see if it's working if she is okay with that. Thanks

## 2020-08-03 NOTE — Telephone Encounter (Signed)
Spoke with patient in regards to Dr. Doyne Keel recommendations, she verbalized understanding and was okay with waiting, she misunderstood the timing of repeat labs.   Reminder in epic for repeat Vitamin D in 1 month.

## 2020-08-03 NOTE — Telephone Encounter (Signed)
Pt called requesting order for blood work to check her vitamin D levels. She stated that Dr. Havery Moros wanted to check them in December.

## 2020-08-23 ENCOUNTER — Other Ambulatory Visit: Payer: Self-pay

## 2020-08-23 ENCOUNTER — Ambulatory Visit
Admission: EM | Admit: 2020-08-23 | Discharge: 2020-08-23 | Disposition: A | Discharge status: Home / Self Care | Payer: BC Managed Care – PPO | Attending: Internal Medicine | Admitting: Internal Medicine

## 2020-08-23 DIAGNOSIS — Z1152 Encounter for screening for COVID-19: Secondary | ICD-10-CM

## 2020-08-23 DIAGNOSIS — J029 Acute pharyngitis, unspecified: Secondary | ICD-10-CM

## 2020-08-23 NOTE — ED Triage Notes (Signed)
Pt presents with nasal congestion and sore throat that began last night with chills through the night

## 2020-08-23 NOTE — Discharge Instructions (Signed)
Increase oral fluid intake Please quarantine until COVID-19 test results are available Please sign up for MyChart so you can see your lab results We will call you with recommendations if your test results are significant.

## 2020-08-24 LAB — COVID-19, FLU A+B NAA
Influenza A, NAA: NOT DETECTED
Influenza B, NAA: NOT DETECTED
SARS-CoV-2, NAA: NOT DETECTED

## 2020-08-25 ENCOUNTER — Ambulatory Visit: Payer: BC Managed Care – PPO | Admitting: Urology

## 2020-08-25 NOTE — Telephone Encounter (Signed)
Colletta Maryland called from [facility] wanting to clarify some information regarding the patients Stelara medication approval status  [phone]

## 2020-08-25 NOTE — ED Provider Notes (Signed)
[facility]    CSN: [id number] Arrival date & time: 08/23/20  0844      History   Chief Complaint Chief Complaint  Patient presents with  . Nasal Congestion  . Fever    HPI Lauren [name] is a 59 y.o. female comes to the urgent care with complaints of nasal congestion and subjective fever and chills of 1 day duration.  Patient denies any nausea, vomiting or diarrhea.  Patient has some sore throat.  No sick contacts. HPI  Past Medical History:  Diagnosis Date  . Allergy   . Anemia   . Anxiety   . Cellulitis of left breast 09/17/2015   diagnosed at Urgent Care- on ABX  . Clostridium difficile diarrhea   . Clotting disorder ([place])   . Depression    takes Prozac daily  . Hearing loss    right ear  . History of colon polyps    benign  . Hyperlipidemia   . Hypertension    takes Metoprolol daily  . Hypokalemia    takes Potassium every other day  . Inflammatory polyps of colon ([place])   . Internal hemorrhoids   . Mucosal abnormality of stomach   . Osteopenia   . Osteoporosis   . Personal history of meningioma of the brain   . Platelet disorder ([place])   . Pneumonia    hx of-about 4+ yrs ago  . Seasonal allergies    takes Allegra as needed and Flonase daily  . SVT (supraventricular tachycardia) ([place])   . Tennis elbow   . Ulcerative colitis    On Entyvio q 4 week dosing  . Urinary urgency   . Vitamin B12 deficiency   . Von Willebrands disease [facility])     Patient Active Problem List   Diagnosis Date Noted  . Von Willebrand disease ([place])   . Vitamin D insufficiency 09/26/2016  . Microscopic hematuria 09/26/2016  . Left sided colitis without complications ([place])   . Benign neoplasm of colon 07/29/2013  . [place] platelet disorder ([place]) 07/17/2013  . Osteoporosis 04/04/2013  . VITAMIN B12 DEFICIENCY 04/05/2010  . Sinoatrial node dysfunction ([place]) 10/15/2009  . Reactive depression 09/17/2009  . HEMORRHOIDS-EXTERNAL 05/18/2009  . Anxiety state 02/26/2008   . Essential hypertension 02/26/2008  . Ulcerative colitis ([place]) 02/26/2008  . COUGH, CHRONIC 02/26/2008    Past Surgical History:  Procedure Laterality Date  . BAHA REVISION Right 03/15/2016   Procedure: REVISION RIGHT BAHA HEARING IMPLANT ;  Surgeon: Vicie Mutters, MD;  Location: [place];  Service: ENT;  Laterality: Right;  . BIOPSY  01/24/2019   Procedure: BIOPSY;  Surgeon: Yetta Flock, MD;  [facility];  Service: Gastroenterology;;  . BIOPSY  01/15/2020   Procedure: BIOPSY;  Surgeon: Yetta Flock, MD;  [facility];  Service: Gastroenterology;;  . BRAIN MENINGIOMA EXCISION  05/2007  . CARDIAC ELECTROPHYSIOLOGY STUDY AND ABLATION  10/22/09  . COLONOSCOPY N/A 07/29/2013   Procedure: COLONOSCOPY;  Surgeon: Lafayette Dragon, MD;  [facility];  Service: Endoscopy;  Laterality: N/A;  . COLONOSCOPY WITH PROPOFOL N/A 09/21/2015   Procedure: COLONOSCOPY WITH PROPOFOL;  Surgeon: Manus Gunning, MD;  [facility];  Service: Gastroenterology;  Laterality: N/A;  . COLONOSCOPY WITH PROPOFOL N/A 10/30/2017   Procedure: COLONOSCOPY WITH PROPOFOL;  Surgeon: Yetta Flock, MD;  [facility];  Service: Gastroenterology;  Laterality: N/A;  . COLONOSCOPY WITH PROPOFOL N/A 01/24/2019   Procedure: COLONOSCOPY WITH PROPOFOL;  Surgeon: Yetta Flock,  MD;  [facility];  Service: Gastroenterology;  Laterality: N/A;  . COLONOSCOPY WITH PROPOFOL N/A 01/15/2020   Procedure: COLONOSCOPY WITH PROPOFOL;  Surgeon: Yetta Flock, MD;  [facility];  Service: Gastroenterology;  Laterality: N/A;  needs DDAVP 30-60 minutes prior to procedure.  . ESOPHAGOGASTRODUODENOSCOPY    . IMPLANTATION BONE ANCHORED HEARING AID Right   . POLYPECTOMY  01/24/2019   Procedure: POLYPECTOMY;  Surgeon: Yetta Flock, MD;  [facility];  Service: Gastroenterology;;  . POLYPECTOMY  01/15/2020   Procedure: POLYPECTOMY;   Surgeon: Yetta Flock, MD;  [facility];  Service: Gastroenterology;;  . Arnetha Courser TOOTH EXTRACTION      OB History    Gravida  1   Para  1   Term      Preterm      AB      Living  1     SAB      IAB      Ectopic      Multiple      Live Births               Home Medications    Prior to Admission medications   Medication Sig Start Date End Date Taking? Authorizing Provider  clotrimazole-betamethasone (LOTRISONE) cream Apply 1 application topically as needed. 04/15/20   [provider]  FLUoxetine (PROZAC) 20 MG capsule Take 20 mg by mouth daily 06/27/18   Tenna Delaine D, PA-C  hydrocortisone 2.5 % ointment Apply 1 application topically 2 (two) times daily as needed (for eczema).    [provider]  mesalamine (ROWASA) 4 g enema Place 60 mLs (4 g total) rectally at bedtime for 30 doses. Then as needed thereafter 01/19/20 06/18/20  Armbruster, Carlota Raspberry, MD  metoprolol succinate (TOPROL-XL) 50 MG 24 hr tablet Take 1 tablet (50 mg total) by mouth daily. Take with or immediately following a meal. 06/27/18   Timmothy Euler, [place] D, PA-C  STIMATE 1.5 MG/ML SOLN Place 1 spray into both nostrils See admin instructions. Use 1 spray in each nostril 30-60 minutes prior to procedure 10/02/17   [provider]  ustekinumab (STELARA) 90 MG/ML SOSY injection Inject 1 mL (90 mg total) into the skin every 28 (twenty-eight) days. 07/02/20   Armbruster, Carlota Raspberry, MD    Family History Family History  Problem Relation Age of Onset  . Heart disease Maternal Grandfather   . Heart disease Mother   . Stroke Mother   . Diabetes Maternal Uncle   . Colon cancer Neg Hx   . Breast cancer Neg Hx   . Esophageal cancer Neg Hx   . Rectal cancer Neg Hx   . Stomach cancer Neg Hx     Social History Social History   Tobacco Use  . Smoking status: Never Smoker  . Smokeless tobacco: Never Used  Vaping Use  . Vaping Use: Never used  Substance Use  Topics  . Alcohol use: No    Alcohol/week: 0.0 standard drinks  . Drug use: No     Allergies   Atenolol, Asa [aspirin], Cephalexin, Ibuprofen, Mercaptopurine, and Nsaids   Review of Systems Review of Systems  Constitutional: Positive for chills and fatigue.  HENT: Positive for congestion and sore throat.   Respiratory: Positive for cough.      Physical Exam Triage Vital Signs ED Triage Vitals [08/23/20 0859]  Enc Vitals Group     BP 107/68     Pulse Rate 62  Resp 18     Temp 98.9 F (37.2 C)     Temp src      SpO2 97 %     Weight      Height      Head Circumference      Peak Flow      Pain Score      Pain Loc      Pain Edu?      Excl. in [place]?    No data found.  Updated Vital Signs BP 107/68   Pulse 62   Temp 98.9 F (37.2 C)   Resp 18   LMP 11/26/2008   SpO2 97%   Visual Acuity Right Eye Distance:   Left Eye Distance:   Bilateral Distance:    Right Eye Near:   Left Eye Near:    Bilateral Near:     Physical Exam Vitals and nursing note reviewed.  Constitutional:      General: She is not in acute distress.    Appearance: She is not ill-appearing.  HENT:     Mouth/Throat:     Pharynx: Posterior oropharyngeal erythema present.  Cardiovascular:     Rate and Rhythm: Normal rate and regular rhythm.     Pulses: Normal pulses.     Heart sounds: Normal heart sounds.  Pulmonary:     Effort: Pulmonary effort is normal.     Breath sounds: Normal breath sounds.  Neurological:     Mental Status: She is alert.      UC Treatments / Results  Labs (all labs ordered are listed, but only abnormal results are displayed) Labs Reviewed  COVID-19, FLU A+B NAA   Narrative:    Test(s) 140142-Influenza A, NAA; 140143-Influenza B, NAA was developed and its performance characteristics determined by [facility]. It has not been cleared or approved by the Food and Drug Administration. Performed at:  [address] [place], [place]   [id number] Lab Director: Rush Farmer MD, Phone:  [phone]    EKG   Radiology No results found.  Procedures Procedures (including critical care time)  Medications Ordered in UC Medications - No data to display  Initial Impression / Assessment and Plan / UC Course  I have reviewed the triage vital signs and the nursing notes.  Pertinent labs & imaging results that were available during my care of the patient were reviewed by me and considered in my medical decision making (see chart for details).     1.  Acute viral pharyngitis: COVID-19 PCR, flu A/B PCR test sent Patient is advised to quarantine until COVID-19 test results are available Increase oral fluid intake Return precautions given. Final Clinical Impressions(s) / UC Diagnoses   Final diagnoses:  Acute viral pharyngitis     Discharge Instructions     Increase oral fluid intake Please quarantine until COVID-19 test results are available Please sign up for MyChart so you can see your lab results We will call you with recommendations if your test results are significant.   ED Prescriptions    None     PDMP not reviewed this encounter.   Chase Picket, MD 08/25/20 1726

## 2020-08-25 NOTE — Telephone Encounter (Signed)
Spoke with Colletta Maryland at [facility], she states that the order that was approved for Stelara 90 mg every 28 days has been Approved for the place of service as pharmacy - place of service should state [facility] office. Colletta Maryland provided me with the provider name - Dr. Irine Seal, NPI [id number], POS 11  New PA had to be started via Cover My Med[employer], Colletta Maryland states that the prior authorization should be for Stelara 45 mg solution. All information has been completed awaiting a response from patient's insurance company.   Key: [id number]

## 2020-09-02 NOTE — Telephone Encounter (Signed)
Lm on vm for Colletta Maryland at [facility] to return call in regards to [place].

## 2020-09-03 ENCOUNTER — Other Ambulatory Visit: Payer: Self-pay

## 2020-09-03 ENCOUNTER — Telehealth: Payer: Self-pay

## 2020-09-03 DIAGNOSIS — E559 Vitamin D deficiency, unspecified: Secondary | ICD-10-CM

## 2020-09-03 NOTE — Telephone Encounter (Signed)
-----   Message from Yevette Edwards, RN sent at 08/03/2020  4:52 PM EST ----- Regarding: Labs Repeat Vitamin D level

## 2020-09-03 NOTE — Telephone Encounter (Signed)
Spoke with Colletta Maryland at [facility] in regards to denial for Stelara 45 mg solution x 2 every 4 weeks. Colletta Maryland asked that I fax latest office note, labs, and all correspondences from insurance to her so that she can review them. She states that we may have to do a peer to peer. Advised that I will get everything faxed to her. Fax: [phone].

## 2020-09-03 NOTE — Telephone Encounter (Signed)
Lab order in epic. My chart message to patient with lab reminder.

## 2020-09-20 NOTE — Telephone Encounter (Signed)
Lm on vm for [place] at [facility] to return call.

## 2020-09-28 ENCOUNTER — Telehealth: Payer: Self-pay

## 2020-09-28 NOTE — Telephone Encounter (Signed)
-----   Message from Yevette Edwards, RN sent at 09/22/2020  9:12 AM EST ----- Regarding: Follow up Call Lauren [name] at [facility] every 4 weeks

## 2020-09-28 NOTE — Telephone Encounter (Signed)
Lm on vm for Stephanie at [facility] to return call to discuss patient's Stelara.

## 2020-09-29 ENCOUNTER — Telehealth: Payer: Self-pay | Admitting: Gastroenterology

## 2020-09-29 NOTE — Telephone Encounter (Signed)
Spoke with Donalda Ewings at [employer], advsied that we are waiting on assistance from [place] a GMA with the appeal.

## 2020-09-29 NOTE — Telephone Encounter (Signed)
Cover my meds called in reference to [place] for Stelara. Pls call them at [phone], Ref # [id number].

## 2020-10-05 ENCOUNTER — Ambulatory Visit: Payer: BC Managed Care – PPO | Admitting: Urology

## 2020-10-05 NOTE — Telephone Encounter (Signed)
Patient has agreed to self inject Stelara 90 mg/ml every 4 weeks.

## 2020-11-08 ENCOUNTER — Other Ambulatory Visit (INDEPENDENT_AMBULATORY_CARE_PROVIDER_SITE_OTHER): Payer: BC Managed Care – PPO

## 2020-11-08 DIAGNOSIS — E559 Vitamin D deficiency, unspecified: Secondary | ICD-10-CM

## 2020-11-08 LAB — VITAMIN D 25 HYDROXY (VIT D DEFICIENCY, FRACTURES): VITD: 49.18 ng/mL (ref 30.00–100.00)

## 2020-11-22 ENCOUNTER — Ambulatory Visit (INDEPENDENT_AMBULATORY_CARE_PROVIDER_SITE_OTHER): Payer: BC Managed Care – PPO | Admitting: Urology

## 2020-11-22 ENCOUNTER — Other Ambulatory Visit: Payer: Self-pay

## 2020-11-22 ENCOUNTER — Encounter: Payer: Self-pay | Admitting: Urology

## 2020-11-22 VITALS — BP 130/76 | HR 54 | Temp 98.4°F | Ht 59.0 in | Wt 138.0 lb

## 2020-11-22 DIAGNOSIS — R3129 Other microscopic hematuria: Secondary | ICD-10-CM

## 2020-11-22 LAB — URINALYSIS, ROUTINE W REFLEX MICROSCOPIC
Bilirubin, UA: NEGATIVE
Glucose, UA: NEGATIVE
Ketones, UA: NEGATIVE
Nitrite, UA: NEGATIVE
Protein,UA: NEGATIVE
Specific Gravity, UA: 1.025 (ref 1.005–1.030)
Urobilinogen, Ur: 0.2 mg/dL (ref 0.2–1.0)
pH, UA: 6 (ref 5.0–7.5)

## 2020-11-22 LAB — MICROSCOPIC EXAMINATION: Renal Epithel, UA: NONE SEEN /hpf

## 2020-11-22 NOTE — Patient Instructions (Signed)

## 2020-11-22 NOTE — Progress Notes (Signed)
.  Urological Symptom Review  Patient is experiencing the following symptoms: Hard to postpone urination Leakage of urine Get up at night to urinate Leakage of urine   Review of Systems  Gastrointestinal (upper)  : Negative for upper GI symptoms  Gastrointestinal (lower) : Negative for lower GI symptoms  Constitutional : Negative for symptoms  Skin: Negative for skin symptoms  Eyes: Negative for eye symptoms  Ear/Nose/Throat : Negative  Hematologic/Lymphatic: Negative for Hematologic/Lymphatic symptoms  Cardiovascular : Negative for cardiovascular symptoms  Respiratory : Negative for respiratory symptoms  Endocrine: Negative for endocrine symptoms  Musculoskeletal: Joint pain  Neurological: Negative for neurological symptoms  Psychologic: Negative for psychiatric symptoms

## 2020-11-22 NOTE — Progress Notes (Signed)
11/22/2020 3:05 PM   Lauren [name] 03-26-1961 [id number]  Referring provider: Bernerd Limbo, MD [address] [place],  [place] [address]  followup microhematuria  HPI: Lauren [name] is a 60yo here for followup for microhematuria and dysuria. UA today shows 3-10 RBCs. She has rare dysuria especially after she eats acidic food. No other complaints today. No significant LUTS, no urinary frequency, nocturia 1-2x.   Her records from [facility] are as follows: I have pain or burning with urination.  HPI: Lauren [name] is a 60 year-old female established patient who is here for pain or burning while urinating.  She does have pain or burning when she urinates. She first noticed the symptom approximately 03/03/2019.   She does urinate more frequently than once every 4 hours in the daytime. She does not have to strain or bear down to start her urinary stream. She does not dribble at the end of urination. She does have an abnormal sensation when needing to urinate. She usually gets up at night to urinate 2 times.   She has not previously had an indwelling catheter in for more than two weeks at a time.   03/24/2019: She developed dysuria 3 weeks ago with associated urgency, and frequency. UA shows CaOx crystals.   04/24/2019: The frequency is stable. No flank pain.   05/13/2019: CT stone study did not show any calculi. dysuria is still present especially when her urine is concentrated.   05/29/2019: Dysuria started to improve on diflucan for 2 doses.   08/21/2019: dysuria resolved after 5 days of diflucan   02/19/2020: NO issues with dysuria since last visit. UA today shows leuks and blood     PMH: Past Medical History:  Diagnosis Date  . Allergy   . Anemia   . Anxiety   . Cellulitis of left breast 09/17/2015   diagnosed at Urgent Care- on ABX  . Clostridium difficile diarrhea   . Clotting disorder ([place])   . Depression    takes Prozac daily  . Hearing loss    right ear  .  History of colon polyps    benign  . Hyperlipidemia   . Hypertension    takes Metoprolol daily  . Hypokalemia    takes Potassium every other day  . Inflammatory polyps of colon ([place])   . Internal hemorrhoids   . Mucosal abnormality of stomach   . Osteopenia   . Osteoporosis   . Personal history of meningioma of the brain   . Platelet disorder ([place])   . Pneumonia    hx of-about 4+ yrs ago  . Seasonal allergies    takes Allegra as needed and Flonase daily  . SVT (supraventricular tachycardia) ([place])   . Tennis elbow   . Ulcerative colitis    On Entyvio q 4 week dosing  . Urinary urgency   . Vitamin B12 deficiency   . Von Willebrands disease [facility])     Surgical History: Past Surgical History:  Procedure Laterality Date  . BAHA REVISION Right 03/15/2016   Procedure: REVISION RIGHT BAHA HEARING IMPLANT ;  Surgeon: Vicie Mutters, MD;  Location: [place];  Service: ENT;  Laterality: Right;  . BIOPSY  01/24/2019   Procedure: BIOPSY;  Surgeon: Yetta Flock, MD;  [facility];  Service: Gastroenterology;;  . BIOPSY  01/15/2020   Procedure: BIOPSY;  Surgeon: Yetta Flock, MD;  [facility];  Service: Gastroenterology;;  . BRAIN MENINGIOMA EXCISION  05/2007  . CARDIAC ELECTROPHYSIOLOGY  STUDY AND ABLATION  10/22/09  . COLONOSCOPY N/A 07/29/2013   Procedure: COLONOSCOPY;  Surgeon: Lafayette Dragon, MD;  [facility];  Service: Endoscopy;  Laterality: N/A;  . COLONOSCOPY WITH PROPOFOL N/A 09/21/2015   Procedure: COLONOSCOPY WITH PROPOFOL;  Surgeon: Manus Gunning, MD;  [facility];  Service: Gastroenterology;  Laterality: N/A;  . COLONOSCOPY WITH PROPOFOL N/A 10/30/2017   Procedure: COLONOSCOPY WITH PROPOFOL;  Surgeon: Yetta Flock, MD;  [facility];  Service: Gastroenterology;  Laterality: N/A;  . COLONOSCOPY WITH PROPOFOL N/A 01/24/2019   Procedure: COLONOSCOPY WITH PROPOFOL;  Surgeon: Yetta Flock, MD;  [facility];  Service: Gastroenterology;  Laterality: N/A;  . COLONOSCOPY WITH PROPOFOL N/A 01/15/2020   Procedure: COLONOSCOPY WITH PROPOFOL;  Surgeon: Yetta Flock, MD;  [facility];  Service: Gastroenterology;  Laterality: N/A;  needs DDAVP 30-60 minutes prior to procedure.  . ESOPHAGOGASTRODUODENOSCOPY    . IMPLANTATION BONE ANCHORED HEARING AID Right   . POLYPECTOMY  01/24/2019   Procedure: POLYPECTOMY;  Surgeon: Yetta Flock, MD;  [facility];  Service: Gastroenterology;;  . POLYPECTOMY  01/15/2020   Procedure: POLYPECTOMY;  Surgeon: Yetta Flock, MD;  [facility];  Service: Gastroenterology;;  . WISDOM TOOTH EXTRACTION      Home Medications:  Allergies as of 11/22/2020      Reactions   Atenolol Swelling   Asa [aspirin]    Platelet disorder   Cephalexin Other (See Comments)   Upset her colitis Upset her colitis   Ibuprofen    Platelet disrder   Mercaptopurine Hives, Itching   Elevated liver levels   Nsaids Other (See Comments)   PLATELET DISORDER       Medication List       Accurate as of November 22, 2020  3:05 PM. If you have any questions, ask your nurse or doctor.        clotrimazole-betamethasone cream Commonly known as: LOTRISONE Apply 1 application topically as needed.   ESSENTIAL AMINO ACID MIX PO Take 1 capsule by mouth daily.   FLUoxetine 20 MG capsule Commonly known as: PROZAC Take 20 mg by mouth daily   hydrocortisone 2.5 % ointment Apply 1 application topically 2 (two) times daily as needed (for eczema).   mesalamine 4 g enema Commonly known as: ROWASA Place 60 mLs (4 g total) rectally at bedtime for 30 doses. Then as needed thereafter   metoprolol succinate 50 MG 24 hr tablet Commonly known as: TOPROL-XL Take 1 tablet (50 mg total) by mouth daily. Take with or immediately following a meal.   NON FORMULARY Take 1 tablet by mouth daily. Terrazyme (Essential Oil)   Omega 3 1000 MG  Caps Take 1 capsule by mouth daily.   Stelara 90 MG/ML Sosy injection Generic drug: ustekinumab Inject 1 mL (90 mg total) into the skin every 28 (twenty-eight) days.   Stimate 1.5 MG/ML Soln Generic drug: desmopressin Place 1 spray into both nostrils See admin instructions. Use 1 spray in each nostril 30-60 minutes prior to procedure   Vitamin D3 125 MCG (5000 UT) Caps Take 1 capsule by mouth daily.       Allergies:  Allergies  Allergen Reactions  . Atenolol Swelling  . Asa [Aspirin]     Platelet disorder   . Cephalexin Other (See Comments)    Upset her colitis Upset her colitis  . Ibuprofen     Platelet disrder  . Mercaptopurine Hives and Itching    Elevated  liver levels  . Nsaids Other (See Comments)    PLATELET DISORDER     Family History: Family History  Problem Relation Age of Onset  . Heart disease Maternal Grandfather   . Heart disease Mother   . Stroke Mother   . Diabetes Maternal Uncle   . Colon cancer Neg Hx   . Breast cancer Neg Hx   . Esophageal cancer Neg Hx   . Rectal cancer Neg Hx   . Stomach cancer Neg Hx     Social History:  reports that she has never smoked. She has never used smokeless tobacco. She reports that she does not drink alcohol and does not use drugs.  ROS: All other review of systems were reviewed and are negative except what is noted above in HPI  Physical Exam: BP 130/76   Pulse (!) 54   Temp 98.4 F (36.9 C) (Oral)   Ht 4' 11"  (1.499 m)   Wt 138 lb (62.6 kg)   LMP 11/26/2008   BMI 27.87 kg/m   Constitutional:  Alert and oriented, No acute distress. HEENT: [place] AT, moist mucus membranes.  Trachea midline, no masses. Cardiovascular: No clubbing, cyanosis, or edema. Respiratory: Normal respiratory effort, no increased work of breathing. GI: Abdomen is soft, nontender, nondistended, no abdominal masses GU: No CVA tenderness.  Lymph: No cervical or inguinal lymphadenopathy. Skin: No rashes, bruises or suspicious  lesions. Neurologic: Grossly intact, no focal deficits, moving all 4 extremities. Psychiatric: Normal mood and affect.  Laboratory Data: Lab Results  Component Value Date   WBC 6.1 06/18/2020   HGB 13.5 06/18/2020   HCT 40.3 06/18/2020   MCV 87.5 06/18/2020   PLT 254.0 06/18/2020    Lab Results  Component Value Date   CREATININE 0.79 06/18/2020    No results found for: PSA  No results found for: TESTOSTERONE  Lab Results  Component Value Date   HGBA1C 5.7 09/03/2009    Urinalysis    Component Value Date/Time   COLORURINE LT. YELLOW 07/31/2013 0811   APPEARANCEUR Clear 12/07/2017 1453   LABSPEC 1.015 07/31/2013 0811   PHURINE 6.0 07/31/2013 0811   GLUCOSEU Negative 12/07/2017 1453   GLUCOSEU NEGATIVE 07/31/2013 0811   HGBUR Moderate 07/31/2013 0811   BILIRUBINUR Negative 12/07/2017 1453   KETONESUR negative 09/26/2016 1814   KETONESUR NEGATIVE 07/31/2013 0811   PROTEINUR Negative 12/07/2017 1453   PROTEINUR NEGATIVE 06/24/2007 1621   UROBILINOGEN 0.2 09/26/2016 1814   UROBILINOGEN 0.2 07/31/2013 0811   NITRITE Negative 12/07/2017 1453   NITRITE NEGATIVE 07/31/2013 0811   LEUKOCYTESUR 1+ (A) 12/07/2017 1453    Lab Results  Component Value Date   MUCUS neg 12/31/2014   BACTERIA neg 12/31/2014    Pertinent Imaging:  No results found for this or any previous visit.  No results found for this or any previous visit.  No results found for this or any previous visit.  No results found for this or any previous visit.  No results found for this or any previous visit.  No results found for this or any previous visit.  No results found for this or any previous visit.  No results found for this or any previous visit.   Assessment & Plan:    1. Microscopic hematuria -renal US , will call with results. If the [place] is normal I will see her back in 1 year with a renal US   No follow-ups on file.  Nicolette Bang, MD  [facility]  [place]

## 2020-11-24 LAB — URINE CULTURE

## 2020-11-29 ENCOUNTER — Encounter: Payer: Self-pay | Admitting: Gastroenterology

## 2020-11-29 ENCOUNTER — Ambulatory Visit (INDEPENDENT_AMBULATORY_CARE_PROVIDER_SITE_OTHER): Payer: BC Managed Care – PPO | Admitting: Gastroenterology

## 2020-11-29 VITALS — BP 122/80 | Ht 59.0 in | Wt 135.0 lb

## 2020-11-29 DIAGNOSIS — Z79899 Other long term (current) drug therapy: Secondary | ICD-10-CM

## 2020-11-29 DIAGNOSIS — K5100 Ulcerative (chronic) pancolitis without complications: Secondary | ICD-10-CM | POA: Diagnosis not present

## 2020-11-29 NOTE — Patient Instructions (Signed)
If you are age 60 or older, your body mass index should be between 23-30. Your Body mass index is 27.27 kg/m. If this is out of the aforementioned range listed, please consider follow up with your Primary Care Provider.  If you are age 79 or younger, your body mass index should be between 19-25. Your Body mass index is 27.27 kg/m. If this is out of the aformentioned range listed, please consider follow up with your Primary Care Provider.   Continue Delsa Grana  You will be due for a hospital colonoscopy in July or August. We will call you when it's time to schedule.   Thank you for entrusting me with your care and for choosing [facility], Dr. [place]ellar

## 2020-11-29 NOTE — Progress Notes (Signed)
HPI :  Colitis History Left sided ulcerative colitis, diagnosed> 15years ago. On Remicade in the past for her colitis, which she thinks she did well for a few years. She had a severe MRSA infection of her ear and ultimately Remicade was held and eventually was stopped given she had done well when it was held. She has been on Imuran in the past, she tolerated it well, she thinks it was weaned off given she had been feeling well also. She has never been on Humira or other anti-TNF agents. She has never been on methotrexate. She has been on Lialda for a long time, prior to that Asacol. She has Von Willebrands. She bleeds easily but has never had significant bleeding / hemorrhages in the past. She has had DDAVP infusions prior to her last colonoscopy or Stilmate nose spray. Started on [facility] April 2018 following a few flares that Spring.Failed Entyvio 2019, started Humira and 6MP Feb / March 2020.Failed Humira / 6 MP combination therapy 12/2018. Hepatoxicity to thiopurines - 6MMP levels > 50K. Transitioned to Stelara monotherapy 02/2019. Now PANCOLONIC colitis.   SINCE LAST VISIT:  60 year old female here for follow-up visit.  She was last seen in October.  At that point time she was found to have subtherapeutic Stelara levels and some active inflammation on her colonoscopy last May.  We elected to try to get her approved for Stelara dosing every month.  This took some time with her insurance company to cover it however she just started once monthly dosing in February.  Generally she has been doing really well since of last seen her.  She appears to be tolerating the higher dose of the medication although interestingly states for a few days after the dose she sometimes feels a bit worse.  She has seen a dietitian and states she had blood testing to test for food allergens, is trying to avoid those.  She is also been working on weight loss with a nutritionist and has lost about 70 pounds over the past  year.  She states between the nutritional changes and weight loss and medication in general she is feeling much better.  She is having 1-2 bowel movements per day.  Occasional rare blood, has some firm stools and occasional loose but generally doing better without any flares.  She is on vitamin D supplementation and her vitamin D level had normalized at the last blood draw.  She is up-to-date with her vaccines as outlined below  She has not been on 6-MP due to hepatotoxicity in the past.  IBD Health Care Maintenance: Annual Flu Vaccine- Date due: 2021 UTD Pneumococcal Vaccineif receiving immunosuppression: - Date PCV 13 - 05/21/2018,PPSV 23 05/01/19 Zoster vaccinei12/18/20, 05/17/19 COVID VACCINE UTD TB testingif on anti-TNF, yearly - negative 05/2020 Vitamin Dscreening - Date 11/08/20 - level of 49.18 Last Colonoscopy :  Colonoscopy 01/15/20 - The examined portion of the ileum was normal. - One 3 to 4 mm polyp in the sigmoid colon, removed with a cold snare. Resected and retrieved. - One 3 mm polyp at the recto-sigmoid colon, removed with a cold biopsy forceps. Resected and retrieved. - Mild (Mayo Score 1) ulcerative colitis of the distal 20cm, otherwise no other inflammatory changes noted. Overall, significantly improved since the last examination. Biopsied. - Biopsies for surveillance were taken from the right colon, left colon, transverse colon, and Rectum.  FINAL MICROSCOPIC DIAGNOSIS:   A. COLON, RUGHT, BIOPSY:  - Chronic colitis  - No active inflammation, granulomas, or malignancy  identified   B. COLON, TRANSVERSE, BIOPSY:  - Chronic colitis  - No active inflammation, granulomas, or malignancy identified   C. COLON, LEFT, BIOPSY:  - Chronic colitis  - No active inflammation, granulomas, or malignancy identified   D. COLON, SIGMOID, BIOPSY:  - Chronic colitis  - No active inflammation, granulomas, or malignancy identified   E. COLON, SIGMOID RECTAL, BIOPSY:   - Chronic mildly active colitis  - No granulomata, dysplasia or malignancy identified   F. COLON, RECTAL, BIOPSY:  - Chronic mildly active colitis  - No granulomata, dysplasia or malignancy identified    Colonoscopy 01/24/2019 -severe left sided inflammation, mild in transverse and right colon, normal ileum. Benign inflammatory polyp of the cecum  Colonoscopy 2014 - active rectal inflammation, inflammatory polyp Colonoscopy 09/21/2015 - overall good control of colitis with small mild area of inflammation in left colon, mild chronic colitis Colonoscopy 10/30/2017 - normal ileum, 10-15cm segment of mild inflammation, otherwise normal - mildly active colitis   Past Medical History:  Diagnosis Date  . Allergy   . Anemia   . Anxiety   . Cellulitis of left breast 09/17/2015   diagnosed at Urgent Care- on ABX  . Clostridium difficile diarrhea   . Clotting disorder ([place])   . Depression    takes Prozac daily  . Hearing loss    right ear  . History of colon polyps    benign  . Hyperlipidemia   . Hypertension    takes Metoprolol daily  . Hypokalemia    takes Potassium every other day  . Inflammatory polyps of colon ([place])   . Internal hemorrhoids   . Mucosal abnormality of stomach   . Osteopenia   . Osteoporosis   . Personal history of meningioma of the brain   . 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 Pneumonia    hx of-about 4+ yrs ago  . Seasonal allergies    takes Allegra as needed and Flonase daily  . SVT (supraventricular tachycardia) ([place])   . Tennis elbow   . Ulcerative colitis    On Entyvio q 4 week dosing  . Urinary urgency   . Vitamin B12 deficiency   . Von Willebrands disease [facility])      Past Surgical History:  Procedure Laterality Date  . BAHA REVISION Right 03/15/2016   Procedure: REVISION RIGHT BAHA HEARING IMPLANT ;  Surgeon: Vicie Mutters, MD;  Location: [place];  Service: ENT;  Laterality: Right;  . BIOPSY  01/24/2019   Procedure: BIOPSY;  Surgeon: Yetta Flock, MD;  [facility];  Service: Gastroenterology;;  . BIOPSY  01/15/2020   Procedure: BIOPSY;  Surgeon: Yetta Flock, MD;  [facility];  Service: Gastroenterology;;  . BRAIN MENINGIOMA EXCISION  05/2007  . CARDIAC ELECTROPHYSIOLOGY STUDY AND ABLATION  10/22/09  . COLONOSCOPY N/A 07/29/2013   Procedure: COLONOSCOPY;  Surgeon: Lafayette Dragon, MD;  [facility];  Service: Endoscopy;  Laterality: N/A;  . COLONOSCOPY WITH PROPOFOL N/A 09/21/2015   Procedure: COLONOSCOPY WITH PROPOFOL;  Surgeon: Manus Gunning, MD;  [facility];  Service: Gastroenterology;  Laterality: N/A;  . COLONOSCOPY WITH PROPOFOL N/A 10/30/2017   Procedure: COLONOSCOPY WITH PROPOFOL;  Surgeon: Yetta Flock, MD;  [facility];  Service: Gastroenterology;  Laterality: N/A;  . COLONOSCOPY WITH PROPOFOL N/A 01/24/2019   Procedure: COLONOSCOPY WITH PROPOFOL;  Surgeon: Yetta Flock, MD;  [facility];  Service: Gastroenterology;  Laterality: N/A;  . COLONOSCOPY WITH PROPOFOL N/A 01/15/2020  Procedure: COLONOSCOPY WITH PROPOFOL;  Surgeon: Yetta Flock, MD;  [facility];  Service: Gastroenterology;  Laterality: N/A;  needs DDAVP 30-60 minutes prior to procedure.  . ESOPHAGOGASTRODUODENOSCOPY    . IMPLANTATION BONE ANCHORED HEARING AID Right   . POLYPECTOMY  01/24/2019   Procedure: POLYPECTOMY;  Surgeon: Yetta Flock, MD;  [facility];  Service: Gastroenterology;;  . POLYPECTOMY  01/15/2020   Procedure: POLYPECTOMY;  Surgeon: Yetta Flock, MD;  [facility];  Service: Gastroenterology;;  . Arnetha Courser TOOTH EXTRACTION     Family History  Problem Relation Age of Onset  . Heart disease Maternal Grandfather   . Heart disease Mother   . Stroke Mother   . Diabetes Maternal Uncle   . Colon cancer Neg Hx   . Breast cancer Neg Hx   . Esophageal cancer Neg Hx   . Rectal cancer Neg Hx   . Stomach  cancer Neg Hx    Social History   Tobacco Use  . Smoking status: Never Smoker  . Smokeless tobacco: Never Used  Vaping Use  . Vaping Use: Never used  Substance Use Topics  . Alcohol use: No    Alcohol/week: 0.0 standard drinks  . Drug use: No   Current Outpatient Medications  Medication Sig Dispense Refill  . Amino Acids (ESSENTIAL AMINO ACID MIX PO) Take 1 capsule by mouth daily.    . Cholecalciferol (VITAMIN D3) 125 MCG (5000 UT) CAPS Take 1 capsule by mouth daily.    . clotrimazole-betamethasone (LOTRISONE) cream Apply 1 application topically as needed.    Marland Kitchen FLUoxetine (PROZAC) 20 MG capsule Take 20 mg by mouth daily 90 capsule 0  . hydrocortisone 2.5 % ointment Apply 1 application topically 2 (two) times daily as needed (for eczema).    . metoprolol succinate (TOPROL-XL) 50 MG 24 hr tablet Take 1 tablet (50 mg total) by mouth daily. Take with or immediately following a meal. 90 tablet 0  . NON FORMULARY Take 1 tablet by mouth daily. Terrazyme (Essential Oil)    . Omega 3 1000 MG CAPS Take 1 capsule by mouth daily.    Marland Kitchen STIMATE 1.5 MG/ML SOLN Place 1 spray into both nostrils See admin instructions. Use 1 spray in each nostril 30-60 minutes prior to procedure  1  . tranexamic acid (LYSTEDA) 650 MG TABS tablet Take 1,300 mg by mouth as needed.    . ustekinumab (STELARA) 90 MG/ML SOSY injection Inject 1 mL (90 mg total) into the skin every 28 (twenty-eight) days. 1 mL 11  . mesalamine (ROWASA) 4 g enema Place 60 mLs (4 g total) rectally at bedtime for 30 doses. Then as needed thereafter 1800 mL 1   Current Facility-Administered Medications  Medication Dose Route Frequency Provider Last Rate Last Admin  . desmopressin (DDAVP) 23.6 mcg in sodium chloride 0.9 % IV Syringe  0.3 mcg/kg Intravenous Once Laretta Pyatt, Carlota Raspberry, MD       Allergies  Allergen Reactions  . Atenolol Swelling  . Asa [Aspirin]     Platelet disorder   . Cephalexin Other (See Comments)    Upset her  colitis Upset her colitis  . Ibuprofen     Platelet disrder  . Mercaptopurine Hives and Itching    Elevated liver levels  . Nsaids Other (See Comments)    PLATELET DISORDER      Review of Systems: All systems reviewed and negative except where noted in HPI.   Lab Results  Component Value Date  WBC 6.1 06/18/2020   HGB 13.5 06/18/2020   HCT 40.3 06/18/2020   MCV 87.5 06/18/2020   PLT 254.0 06/18/2020    Lab Results  Component Value Date   CREATININE 0.79 06/18/2020   BUN 12 06/18/2020   NA 141 06/18/2020   K 3.8 06/18/2020   CL 102 06/18/2020   CO2 33 (H) 06/18/2020    Lab Results  Component Value Date   ALT 7 06/18/2020   AST 14 06/18/2020   ALKPHOS 67 06/18/2020   BILITOT 0.5 06/18/2020     Physical Exam: BP 122/80 (BP Location: Left Arm, Patient Position: Sitting)   Ht 4' 11"  (1.499 m)   Wt 135 lb (61.2 kg)   LMP 11/26/2008   BMI 27.27 kg/m  Constitutional: Pleasant,well-developed, female in no acute distress. HEENT: Normocephalic and atraumatic. Conjunctivae are normal. No scleral icterus. Neck supple.  Cardiovascular: Normal rate, regular rhythm.  Pulmonary/chest: Effort normal and breath sounds normal. No wheezing, rales or rhonchi. Abdominal: Soft, nondistended, nontender. There are no masses palpable.  Extremities: no edema Lymphadenopathy: No cervical adenopathy noted. Neurological: Alert and oriented to person place and time. Skin: Skin is warm and dry. No rashes noted. Psychiatric: Normal mood and affect. Behavior is normal.   ASSESSMENT AND PLAN: 60 year old female here for reassessment the following:  Ulcerative pancolitis High risk medication use  As above, Ms. Tschida has failed a variety of regimens in recent years.  Most recently on Stelara, had some active inflammation on standard dosing on last colonoscopy but levels were subtherapeutic.  She had some symptoms at that time as well.  We have eventually gotten approval for Stelara  every 4 weeks which she has recently started.  Generally between this and significant dietary change and weight loss, she has been doing much better in recent months.  We discussed her history and options moving forward.  Really hoping she responds well to the increased dose of Stelara.  We will plan on doing a surveillance colonoscopy at the hospital over the summer time, we will would like a few more doses of Stelara at this frequency prior to reassessing her colon.  This will need to be done at the hospital given her bleeding risks for bleeding disorder, will need hematology recommendations regarding Stimate, etc. We reviewed her healthcare maintenance, her vaccines are up-to-date, vitamin D levels normalized, TB screening up-to-date.  She is having a physical next week at [facility], she will send over her physical exam labs to make sure stable.  She will contact me in the interim if any flares or concerning symptoms, otherwise continue present regimen.  All questions answered  Yukon Cellar, MD [facility] Gastroenterology

## 2020-12-01 ENCOUNTER — Other Ambulatory Visit: Payer: Self-pay

## 2020-12-01 ENCOUNTER — Ambulatory Visit (HOSPITAL_COMMUNITY)
Admission: RE | Admit: 2020-12-01 | Discharge: 2020-12-01 | Disposition: A | Discharge status: Home / Self Care | Payer: BC Managed Care – PPO | Source: Ambulatory Visit | Attending: Urology | Admitting: Urology

## 2020-12-01 DIAGNOSIS — R3129 Other microscopic hematuria: Secondary | ICD-10-CM | POA: Diagnosis present

## 2021-01-21 ENCOUNTER — Telehealth: Payer: Self-pay

## 2021-01-21 ENCOUNTER — Other Ambulatory Visit: Payer: Self-pay

## 2021-01-21 DIAGNOSIS — D6800 Von Willebrand disease, unspecified: Secondary | ICD-10-CM

## 2021-01-21 DIAGNOSIS — K5100 Ulcerative (chronic) pancolitis without complications: Secondary | ICD-10-CM

## 2021-01-21 DIAGNOSIS — Z79899 Other long term (current) drug therapy: Secondary | ICD-10-CM

## 2021-01-21 NOTE — Telephone Encounter (Signed)
Daneil Dan called back from [facility] Hematology and she got the message. She checked with the Dr and will fax over orders for DDVAP for patient's procedure next week.  She confirmed our fax number.

## 2021-01-21 NOTE — Telephone Encounter (Signed)
[facility] Hematology at [phone] (nurse line) and left a detailed message to call or fax back regarding orders for DDAVP desmopressin prior to Colonoscopy on 7-26 at [facility] due to Von Willibrands.

## 2021-01-21 NOTE — Progress Notes (Signed)
Colon scheduled for colonoscopy at [facility] on 7-26th at 10:45am. Patient has bleeding disorder (Von Willebrand). Will needs orders for DDAVP from [facility] Hematology.

## 2021-01-28 NOTE — Telephone Encounter (Signed)
Called and left a detailed message on the nurse line that we are still waiting for orders for DDAVP to colonoscopy procedure in July.

## 2021-01-29 NOTE — Telephone Encounter (Signed)
Orders received from [facility] Hematology.

## 2021-02-07 NOTE — Telephone Encounter (Signed)
Called [facility] unit [phone]) and spoke to [place].  Faxing DDAVP orders from [facility] to [place] at [phone] for patient's procedure on 03-22-21.  Brandy aware orders can also be seen in [place] in Telephone note on 01-21-21.

## 2021-02-07 NOTE — Telephone Encounter (Signed)
Rec'd instant message from [facility] regarding Lauren [name]: "[facility] called you. She said that orders that you sent her. because is a medication order it has to be entered by the doctor per hosp. policy. Only if it is an emergency she can do it. "

## 2021-02-16 ENCOUNTER — Telehealth: Payer: Self-pay

## 2021-02-16 MED ORDER — TRANEXAMIC ACID 650 MG PO TABS: 1300.0000 mg | ORAL_TABLET | Freq: Every day | ORAL | 0 refills | Status: DC

## 2021-02-16 NOTE — Telephone Encounter (Signed)
Called [facility] endoscopy and spoke to [employer]. She indicated she would enter the orders for DDAVP from Dr. Lynann Beaver office at [facility] Hematology as seen in the telephone note in [facility] from 01-21-21.  I will enter the order for Tranexamic acid tablets and send to [facility] for patient to pick up and bring to procedure.

## 2021-02-16 NOTE — Telephone Encounter (Signed)
Called and spoke to patient. Let her know that we received orders from [facility] Hematology for her DDAVP and Tranexamic tablets for her colonoscopy procedure on 7-26 at [facility]. Confirmed her Pre Visit appointment on Monday 7-18. She confirmed she would like to use Sutab again. I reminded her to pick up her Tranexamic script at [facility] to bring to the procedure to be taken for 5 days after the procedure as directed by Dr. Havery Moros

## 2021-03-14 ENCOUNTER — Ambulatory Visit (AMBULATORY_SURGERY_CENTER): Payer: Self-pay | Admitting: *Deleted

## 2021-03-14 ENCOUNTER — Other Ambulatory Visit: Payer: Self-pay

## 2021-03-14 ENCOUNTER — Telehealth: Payer: Self-pay | Admitting: Gastroenterology

## 2021-03-14 VITALS — Ht 59.0 in | Wt 136.0 lb

## 2021-03-14 DIAGNOSIS — K5100 Ulcerative (chronic) pancolitis without complications: Secondary | ICD-10-CM

## 2021-03-14 MED ORDER — SUTAB 1479-225-188 MG PO TABS
1.0000 | ORAL_TABLET | ORAL | 0 refills | Status: DC
Start: 2021-03-14 — End: 2021-04-25

## 2021-03-14 NOTE — Progress Notes (Signed)
Patient is here in-person for PV. Patient denies any allergies to eggs or soy. Patient denies any problems with anesthesia/sedation. Patient denies any oxygen use at home. Patient denies taking any diet/weight loss medications or blood thinners. Patient is not being treated for MRSA or C-diff. Patient is aware of our care-partner policy and IHDTP-12 safety protocol. EMMI education assigned to the patient for the procedure, sent to [place].   Patient is COVID-19 vaccinated, per patient.   Prep Prescription coupon given to the patient.

## 2021-03-14 NOTE — Telephone Encounter (Signed)
Called patient. She was instructed at her previsit today to take the tranexamic tablets for 5 days PRIOR to her colonoscopy scheduled for next week. This should be started ON the day of her procedure if biopsies are taken.  Will call [facility] Hematology to confirm directions for tablets. Patient also asked that we confirm when and if she needs to be seen by [facility] Hematology. Also need to confirm with [facility] unit when patient should arrive for her procedure to begin treatment of DDAVP infusion prior to her procedure.  Left message for [facility] Nurse line at [phone]

## 2021-03-14 NOTE — Telephone Encounter (Signed)
Inbound call from patient requesting a call back regarding medication she have to take ahead of procedure 7/26. Requesting only to speak with [name]. Best contact number [phone]

## 2021-03-15 ENCOUNTER — Other Ambulatory Visit: Payer: Self-pay

## 2021-03-15 MED ORDER — TRANEXAMIC ACID 650 MG PO TABS: 1300.0000 mg | ORAL_TABLET | Freq: Three times a day (TID) | ORAL | 0 refills | Status: AC

## 2021-03-15 NOTE — Telephone Encounter (Signed)
Called and spoke to patient.  She heard from [facility] as outlined below. I explained that we had already sent the corrected script to [facility] and have spoken to the pharmacist there so she will need to let them know she doesn't want to fill that one and can pick up the complete script from Dr. Lynann Beaver office for #30.  I let her know she needs to be at [facility] next Tuesday, 7-26 at 8:45am.  She will call [facility] Hematology to make her 2 yr appointment. I apologized to patient for the mishaps regarding her pre visit and the script. She was very kind and understanding. She will let us know if she has any other questions or concerns.

## 2021-03-15 NOTE — Telephone Encounter (Signed)
Called the Endo Unit to confirm when patient should arrive for procedure next Tuesday, 7-26 due to DDAVP IV which needs to be administered prior to procedure.  They indicated they want her there 2 hours early which would be 8:45 am for a 10:45 am procedure.

## 2021-03-15 NOTE — Telephone Encounter (Signed)
Corrected prescription for tranexamic acid tablets sent to pharmacy [facility] in [place]: [phone]). Script was originally sent in error for 2 tablets once daily for 5 days. Script should be for 2 tablets TID for 5 days. Corrected script sent.  Called and spoke to pharmacist. She will dispense additional 20 tablets since patient already picked up script for once daily = 10 tablets. She confirmed they have the Sutab order but they need to order it.  Patient can pick up the additional tranexamic tablets when she gets her Sutab. Pharmacist made aware of codes that were sent with the script that make it $40.  Asked her to notify me if this she has any issues. Pharmacist took my name

## 2021-03-15 NOTE — Progress Notes (Signed)
Corrected prescription for tranexamic acid tablets sent to pharmacy [facility] in [place]: [phone]). Script was originally sent in error for 2 tablets once daily for 5 days. Script should be for 2 tablets TID for 5 days. Corrected script sent.  Called and spoke to pharmacist. She will dispense additional 20 tablets since patient already picked up script for once daily = 10 tablets. She confirmed they have the Sutab order but they need to order it.  Patient can pick up the additional tranexamic tablets when she gets her Sutab. Pharmacist made aware of codes that were sent with the script that make it $40.  Asked her to notify me if this she has any issues. Pharmacist took my name.

## 2021-03-15 NOTE — Telephone Encounter (Signed)
Daneil Dan from [facility] Hematology returned call. She stated she have spoke with patient and confirmed all instructions from Dr. Gaylyn Cheers for upcoming procedure.   Also, patient is to take Tranexamic tablets after procedure 3x a day for 5 days. Dr. Gaylyn Cheers will send new prescription to the pharmacy. States Ms. Lauren [name] is aware.   She said if there are any other questions or need confirmation can call at anytime before 4:30. Best contact [phone] option 2.

## 2021-03-16 NOTE — Progress Notes (Signed)
Attempted to obtain medical history via telephone, unable to reach at this time. I left a voicemail to return pre surgical testing department's phone call.  

## 2021-03-21 NOTE — Anesthesia Preprocedure Evaluation (Addendum)
Anesthesia Evaluation  Patient identified by MRN, date of birth, ID band Patient awake    Reviewed: Allergy & Precautions, NPO status , Patient's Chart, lab work & pertinent test results, reviewed documented beta blocker date and time   History of Anesthesia Complications Negative for: history of anesthetic complications  Airway Mallampati: I  TM Distance: >3 FB Neck ROM: Full    Dental no notable dental hx.    Pulmonary neg pulmonary ROS,    Pulmonary exam normal        Cardiovascular hypertension, Pt. on medications and Pt. on home beta blockers Normal cardiovascular exam     Neuro/Psych Anxiety Depression    GI/Hepatic Neg liver ROS, PUD,   Endo/Other  negative endocrine ROS  Renal/GU negative Renal ROS  negative genitourinary   Musculoskeletal   Abdominal   Peds  Hematology  (+) Blood dyscrasia, anemia , VWD   Anesthesia Other Findings Day of surgery medications reviewed with patient.  Reproductive/Obstetrics                            Anesthesia Physical Anesthesia Plan  ASA: 2  Anesthesia Plan: MAC   Post-op Pain Management:    Induction:   PONV Risk Score and Plan: Treatment may vary due to age or medical condition and Propofol infusion  Airway Management Planned: Natural Airway and Nasal Cannula  Additional Equipment:   Intra-op Plan:   Post-operative Plan:   Informed Consent: I have reviewed the patients History and Physical, chart, labs and discussed the procedure including the risks, benefits and alternatives for the proposed anesthesia with the patient or authorized representative who has indicated his/her understanding and acceptance.       Plan Discussed with: CRNA  Anesthesia Plan Comments:        Anesthesia Quick Evaluation

## 2021-03-22 ENCOUNTER — Other Ambulatory Visit: Payer: Self-pay

## 2021-03-22 ENCOUNTER — Ambulatory Visit (HOSPITAL_COMMUNITY): Payer: BC Managed Care – PPO | Admitting: Anesthesiology

## 2021-03-22 ENCOUNTER — Ambulatory Visit (HOSPITAL_COMMUNITY)
Admission: RE | Admit: 2021-03-22 | Discharge: 2021-03-22 | Disposition: A | Discharge status: Home / Self Care | Payer: BC Managed Care – PPO | Attending: Gastroenterology | Admitting: Gastroenterology

## 2021-03-22 ENCOUNTER — Encounter (HOSPITAL_COMMUNITY)
Admission: RE | Disposition: A | Discharge status: Home / Self Care | Payer: Self-pay | Source: Home / Self Care | Attending: Gastroenterology

## 2021-03-22 ENCOUNTER — Encounter (HOSPITAL_COMMUNITY): Payer: Self-pay | Admitting: Gastroenterology

## 2021-03-22 DIAGNOSIS — Z91018 Allergy to other foods: Secondary | ICD-10-CM | POA: Diagnosis not present

## 2021-03-22 DIAGNOSIS — D691 Qualitative platelet defects: Secondary | ICD-10-CM | POA: Insufficient documentation

## 2021-03-22 DIAGNOSIS — Z79899 Other long term (current) drug therapy: Secondary | ICD-10-CM

## 2021-03-22 DIAGNOSIS — Z881 Allergy status to other antibiotic agents status: Secondary | ICD-10-CM | POA: Insufficient documentation

## 2021-03-22 DIAGNOSIS — K635 Polyp of colon: Secondary | ICD-10-CM | POA: Diagnosis not present

## 2021-03-22 DIAGNOSIS — D6800 Von Willebrand disease, unspecified: Secondary | ICD-10-CM

## 2021-03-22 DIAGNOSIS — K5100 Ulcerative (chronic) pancolitis without complications: Secondary | ICD-10-CM | POA: Insufficient documentation

## 2021-03-22 DIAGNOSIS — Z8719 Personal history of other diseases of the digestive system: Secondary | ICD-10-CM | POA: Insufficient documentation

## 2021-03-22 DIAGNOSIS — D126 Benign neoplasm of colon, unspecified: Secondary | ICD-10-CM | POA: Diagnosis not present

## 2021-03-22 DIAGNOSIS — Z888 Allergy status to other drugs, medicaments and biological substances status: Secondary | ICD-10-CM | POA: Insufficient documentation

## 2021-03-22 DIAGNOSIS — Z886 Allergy status to analgesic agent status: Secondary | ICD-10-CM | POA: Insufficient documentation

## 2021-03-22 HISTORY — PX: POLYPECTOMY: SHX5525

## 2021-03-22 HISTORY — PX: BIOPSY: SHX5522

## 2021-03-22 HISTORY — PX: HEMOSTASIS CLIP PLACEMENT: SHX6857

## 2021-03-22 HISTORY — PX: COLONOSCOPY WITH PROPOFOL: SHX5780

## 2021-03-22 SURGERY — COLONOSCOPY WITH PROPOFOL
Anesthesia: Monitor Anesthesia Care

## 2021-03-22 MED ORDER — PROPOFOL 500 MG/50ML IV EMUL
INTRAVENOUS | Status: DC | PRN
  Administered 2021-03-22: 160 ug/kg/min via INTRAVENOUS

## 2021-03-22 MED ORDER — LACTATED RINGERS IV SOLN: INTRAVENOUS | Status: DC | PRN

## 2021-03-22 MED ORDER — DESMOPRESSIN ACETATE 4 MCG/ML IJ SOLN
0.3000 ug/kg | Freq: Once | INTRAMUSCULAR | Status: DC
  Filled 2021-03-22: qty 10

## 2021-03-22 MED ORDER — SODIUM CHLORIDE 0.9 % IV SOLN
17.7000 ug | Freq: Once | INTRAVENOUS | Status: AC
  Administered 2021-03-22: 17.7 ug via INTRAVENOUS
  Filled 2021-03-22: qty 4.42

## 2021-03-22 MED ORDER — PROPOFOL 10 MG/ML IV BOLUS
INTRAVENOUS | Status: DC | PRN
  Administered 2021-03-22: 20 mg via INTRAVENOUS
  Administered 2021-03-22: 50 mg via INTRAVENOUS
  Administered 2021-03-22: 30 mg via INTRAVENOUS

## 2021-03-22 MED ORDER — SODIUM CHLORIDE 0.9 % IV SOLN: INTRAVENOUS | Status: DC

## 2021-03-22 SURGICAL SUPPLY — 21 items

## 2021-03-22 NOTE — Anesthesia Procedure Notes (Signed)
Procedure Name: MAC Date/Time: 03/22/2021 11:33 AM Performed by: Deliah Boston, CRNA Pre-anesthesia Checklist: Patient identified, Emergency Drugs available, Suction available and Patient being monitored Patient Re-evaluated:Patient Re-evaluated prior to induction Oxygen Delivery Method: Simple face mask Preoxygenation: Pre-oxygenation with 100% oxygen Placement Confirmation: positive ETCO2 and breath sounds checked- equal and bilateral

## 2021-03-22 NOTE — Interval H&P Note (Signed)
History and Physical Interval Note:  03/22/2021 9:46 AM  Lauren [name]  has presented today for surgery, with the diagnosis of Ulcerative pancolitis, high risk medication use.  The various methods of treatment have been discussed with the patient and family. After consideration of risks, benefits and other options for treatment, the patient has consented to  Procedure(s): COLONOSCOPY WITH PROPOFOL (N/A) as a surgical intervention.  The patient's history has been reviewed, patient examined, no change in status, stable for surgery.  I have reviewed the patient's chart and labs.  Questions were answered to the patient's satisfaction.     [place]

## 2021-03-22 NOTE — Discharge Instructions (Signed)

## 2021-03-22 NOTE — Anesthesia Postprocedure Evaluation (Signed)
Anesthesia Post Note  Patient: Lauren [name]  Procedure(s) Performed: COLONOSCOPY WITH PROPOFOL BIOPSY POLYPECTOMY HEMOSTASIS CLIP PLACEMENT     Patient location during evaluation: PACU Anesthesia Type: MAC Level of consciousness: awake and alert and oriented Pain management: pain level controlled Vital Signs Assessment: post-procedure vital signs reviewed and stable Respiratory status: spontaneous breathing, nonlabored ventilation and respiratory function stable Cardiovascular status: blood pressure returned to baseline Postop Assessment: no apparent nausea or vomiting Anesthetic complications: no   No notable events documented.  Last Vitals:  Vitals:   03/22/21 1220 03/22/21 1240  BP: 132/64 (!) 131/59  Pulse: 67 (!) 58  Resp: 17 (!) 21  Temp:    SpO2: 100% 97%    Last Pain:  Vitals:   03/22/21 1240  TempSrc:   PainSc: 0-No pain                 Brennan Bailey

## 2021-03-22 NOTE — Transfer of Care (Signed)
Immediate Anesthesia Transfer of Care Note  Patient: Lauren [name]  Procedure(s) Performed: Procedure(s): COLONOSCOPY WITH PROPOFOL (N/A) BIOPSY POLYPECTOMY HEMOSTASIS CLIP PLACEMENT  Patient Location: PACU  Anesthesia Type:MAC  Level of Consciousness: Patient easily awoken, sedated, comfortable, cooperative, following commands, responds to stimulation.   Airway & Oxygen Therapy: Patient spontaneously breathing, ventilating well, oxygen via simple oxygen mask.  Post-op Assessment: Report given to PACU RN, vital signs reviewed and stable, moving all extremities.   Post vital signs: Reviewed and stable.  Complications: No apparent anesthesia complications  Last Vitals:  Vitals Value Taken Time  BP 127/53 03/22/21 1212  Temp    Pulse 33 03/22/21 1212  Resp 20 03/22/21 1212  SpO2 78 % 03/22/21 1212  Vitals shown include unvalidated device data.  Last Pain:  Vitals:   03/22/21 0926  TempSrc: Oral  PainSc: 0-No pain         Complications: No notable events documented.

## 2021-03-22 NOTE — Op Note (Signed)
[facility] Patient Name: Lauren [name] Procedure Date: 03/22/2021 MRN: [id number] Attending MD: Carlota Raspberry. Havery Moros , MD Date of Birth: 1960-11-03 CSN: [id number] Age: 60 Admit Type: Outpatient Procedure:                Colonoscopy Indications:              High risk colon cancer surveillance: Ulcerative                            pancolitis - has failed Remicade / Humira combined                            with thiopurines / Entyvio - now on high dose                            Stelara. Platelet disorder, received DDAVP                            pre-procedure Providers:                Remo Lipps P. Havery Moros, MD, Kary Kos RN, RN,                            Terrall Laity, Clae Lenice Pressman,                            Technician, Heide Scales, CRNA Referring MD:              Medicines:                Monitored Anesthesia Care Complications:            No immediate complications. Estimated blood loss:                            Minimal. Estimated Blood Loss:     Estimated blood loss was minimal. Procedure:                Pre-Anesthesia Assessment:                           - Prior to the procedure, a History and Physical                            was performed, and patient medications and                            allergies were reviewed. The patient's tolerance of                            previous anesthesia was also reviewed. The risks                            and benefits of the procedure and the sedation                            options and risks were discussed  with the patient.                            All questions were answered, and informed consent                            was obtained. Prior Anticoagulants: The patient has                            taken no previous anticoagulant or antiplatelet                            agents. ASA Grade Assessment: III - A patient with                            severe systemic disease. After  reviewing the risks                            and benefits, the patient was deemed in                            satisfactory condition to undergo the procedure.                           After obtaining informed consent, the colonoscope                            was passed under direct vision. Throughout the                            procedure, the patient's blood pressure, pulse, and                            oxygen saturations were monitored continuously. The                            PCF-H190DL ([id number] Olympus pediatric colonscope                            was introduced through the anus and advanced to the                            the terminal ileum, with identification of the                            appendiceal orifice and IC valve. The colonoscopy                            was performed without difficulty. The patient                            tolerated the procedure well. The quality of the  bowel preparation was good. The terminal ileum,                            ileocecal valve, appendiceal orifice, and rectum                            were photographed. Scope In: 11:37:53 AM Scope Out: 62:83:15 PM Scope Withdrawal Time: 0 hours 25 minutes 36 seconds  Total Procedure Duration: 0 hours 28 minutes 38 seconds  Findings:      The perianal and digital rectal examinations were normal.      The terminal ileum appeared normal.      Inflammation was found in a continuous and circumferential pattern from       the rectum to the rectosigmoid colon. This was graded as Mayo Score 2       (moderate, with marked erythema, absent vascular pattern, friability,       erosions). Biopsies were taken with a cold forceps for histology.      Inflammation was found in the colon otherwise throughout. This was       graded as Mayo Score 1 (mild, with erythema, decreased vascular pattern,       mild friability). Biopsies were taken with a cold forceps for  histology       in all sections.      A 6 mm polyp was found in the distal transverse colon. The polyp was       semi-pedunculated and inflammatory in appearance. The polyp was removed       with a hot snare on Endocut setting. Resection and retrieval were       complete. To prevent bleeding after the polypectomy, two hemostatic       clips were successfully placed the lesion, given risks for bleeding with       platelet disorder.      A 2 to 3 mm polyp was found in the recto-sigmoid colon. The polyp was       sessile. The polyp was removed with a cold biopsy forceps. Resection and       retrieval were complete.      The exam was otherwise without abnormality. Impression:               - The examined portion of the ileum was normal.                           - Moderately active (Mayo Score 2) ulcerative                            colitis in the rectum / rectosigmoid colon.                            Biopsied.                           - Mild (Mayo Score 1) ulcerative colitis thoughout                            elsewhere. Biopsied.                           -  One 6 mm polyp in the distal transverse colon,                            removed with a hot snare. Resected and retrieved.                            Clips were placed.                           - One 2 to 3 mm polyp at the recto-sigmoid colon,                            removed with a cold biopsy forceps. Resected and                            retrieved.                           - The examination was otherwise normal.                           Unfortunately active inflammation despite Stelara q                            4 weeks dosing. Will discuss options with the                            patient. Moderate Sedation:      No moderate sedation, case performed with MAC Recommendation:           - Patient has a contact number available for                            emergencies. The signs and symptoms of potential                             delayed complications were discussed with the                            patient. Return to normal activities tomorrow.                            Written discharge instructions were provided to the                            patient.                           - Resume previous diet.                           - Continue present medications.                           - Await pathology results. Will discuss other  biologic therapies to consider / change in regimen                           - Tranexamic acid 2 tablets (1300 mg) by mouth 3                            times a day for 5 days after colonoscopy                           - Follow up office in 2 days for lab draw to make                            sure Na stable post DDAVP infusion. Procedure Code(s):        --- Professional ---                           [phone], Colonoscopy, flexible; with removal of                            tumor(s), polyp(s), or other lesion(s) by snare                            technique                           45380, 67, Colonoscopy, flexible; with biopsy,                            single or multiple Diagnosis Code(s):        --- Professional ---                           K51.00, Ulcerative (chronic) pancolitis without                            complications                           K63.5, Polyp of colon CPT copyright 2019 American Medical Association. All rights reserved. The codes documented in this report are preliminary and upon coder review may  be revised to meet current compliance requirements. Remo Lipps P. Havery Moros, MD 03/22/2021 12:19:41 PM This report has been signed electronically. Number of Addenda: 0

## 2021-03-22 NOTE — H&P (Signed)
HPI :  60 y/o female with history of UC and platelet disorder, here at the hospital for colonoscopy to re-evaluate her colitis. She has failed multiple regimens and on now on high dose Stelara, and doing better. She has a platelet disorder and requires DDAVP infusion prior to colonoscopy, which is why she is doing this in the hospital, followed by hematology. No bleeding problems recently. She otherwise has no complaints.    Past Medical History:  Diagnosis Date   Allergy    Anemia    Anxiety    Cellulitis of left breast 09/17/2015   diagnosed at Urgent Care- on ABX   Clostridium difficile diarrhea    Clotting disorder ([place])    Depression    takes Prozac daily   Hearing loss    right ear   History of colon polyps    benign   Hyperlipidemia    Hypertension    takes Metoprolol daily   Hypokalemia    takes Potassium every other day   Inflammatory polyps of colon (HCC)    Internal hemorrhoids    Mucosal abnormality of stomach    Osteopenia    Osteoporosis    Personal history of meningioma of the brain    Platelet disorder ([place])    Pneumonia    hx of-about 4+ yrs ago   Seasonal allergies    takes Allegra as needed and Flonase daily   SVT (supraventricular tachycardia) (HCC)    Tennis elbow    Ulcerative colitis    On Entyvio q 4 week dosing   Urinary urgency    Vitamin B12 deficiency    [place] disease ([place])      Past Surgical History:  Procedure Laterality Date   BAHA REVISION Right 03/15/2016   Procedure: REVISION RIGHT BAHA HEARING IMPLANT ;  Surgeon: Vicie Mutters, MD;  Location: [place];  Service: ENT;  Laterality: Right;   BIOPSY  01/24/2019   Procedure: BIOPSY;  Surgeon: Yetta Flock, MD;  Location: [facility];  Service: Gastroenterology;;   BIOPSY  01/15/2020   Procedure: BIOPSY;  Surgeon: Yetta Flock, MD;  Location: [facility];  Service: Gastroenterology;;   BRAIN MENINGIOMA EXCISION  05/2007   CARDIAC ELECTROPHYSIOLOGY STUDY AND  ABLATION  10/22/09   COLONOSCOPY N/A 07/29/2013   Procedure: COLONOSCOPY;  Surgeon: Lafayette Dragon, MD;  Location: [facility];  Service: Endoscopy;  Laterality: N/A;   COLONOSCOPY WITH PROPOFOL N/A 09/21/2015   Procedure: COLONOSCOPY WITH PROPOFOL;  Surgeon: Manus Gunning, MD;  Location: [facility];  Service: Gastroenterology;  Laterality: N/A;   COLONOSCOPY WITH PROPOFOL N/A 10/30/2017   Procedure: COLONOSCOPY WITH PROPOFOL;  Surgeon: Yetta Flock, MD;  Location: [facility];  Service: Gastroenterology;  Laterality: N/A;   COLONOSCOPY WITH PROPOFOL N/A 01/24/2019   Procedure: COLONOSCOPY WITH PROPOFOL;  Surgeon: Yetta Flock, MD;  Location: [facility];  Service: Gastroenterology;  Laterality: N/A;   COLONOSCOPY WITH PROPOFOL N/A 01/15/2020   Procedure: COLONOSCOPY WITH PROPOFOL;  Surgeon: Yetta Flock, MD;  Location: [facility];  Service: Gastroenterology;  Laterality: N/A;  needs DDAVP 30-60 minutes prior to procedure.   ESOPHAGOGASTRODUODENOSCOPY     IMPLANTATION BONE ANCHORED HEARING AID Right    POLYPECTOMY  01/24/2019   Procedure: POLYPECTOMY;  Surgeon: Yetta Flock, MD;  Location: [facility];  Service: Gastroenterology;;   POLYPECTOMY  01/15/2020   Procedure: POLYPECTOMY;  Surgeon: Yetta Flock, MD;  Location: [facility];  Service: Gastroenterology;;   WISDOM TOOTH EXTRACTION  Family History  Problem Relation Age of Onset   Heart disease Maternal Grandfather    Heart disease Mother    Stroke Mother    Diabetes Maternal Uncle    Colon cancer Neg Hx    Breast cancer Neg Hx    Esophageal cancer Neg Hx    Rectal cancer Neg Hx    Stomach cancer Neg Hx    Social History   Tobacco Use   Smoking status: Never   Smokeless tobacco: Never  Vaping Use   Vaping Use: Never used  Substance Use Topics   Alcohol use: No    Alcohol/week: 0.0 standard drinks   Drug use: No   Current Facility-Administered Medications  Medication  Dose Route Frequency Provider Last Rate Last Admin   0.9 %  sodium chloride infusion   Intravenous Continuous Khyler Eschmann, Carlota Raspberry, MD       desmopressin (DDAVP) 17.7 mcg in sodium chloride 0.9 % 50 mL IVPB  17.7 mcg Intravenous Once Trusten Hume, Carlota Raspberry, MD       Facility-Administered Medications Ordered in Other Encounters  Medication Dose Route Frequency Provider Last Rate Last Admin   lactated ringers infusion   Intravenous Continuous PRN Deliah Boston, CRNA   New Bag at 03/22/21 0930   Allergies  Allergen Reactions   Atenolol Swelling   Asa [Aspirin]     Platelet disorder    Cephalexin Other (See Comments)    Upset her colitis    Corn Starch Other (See Comments)    Gi-upset  Allergy test   Ibuprofen     Platelet disrder   Mercaptopurine Hives and Itching    Elevated liver levels   Nsaids Other (See Comments)    PLATELET DISORDER    Soy Allergy Other (See Comments)    GI symptoms      Review of Systems: All systems reviewed and negative except where noted in HPI.    No results found.  Physical Exam: BP 121/65   Pulse (!) 50   Temp 97.8 F (36.6 C) (Oral)   Resp 15   Ht 4' 11"  (1.499 m)   Wt 59 kg   LMP 11/26/2008   SpO2 100%   BMI 26.26 kg/m  Constitutional: Pleasant,well-developed, female in no acute distress. Cardiovascular: Normal rate, regular rhythm.  Pulmonary/chest: Effort normal and breath sounds normal.  Abdominal: Soft, nondistended, nontender.  Extremities: no edema Neurological: Alert and oriented to person place and time. Skin: Skin is warm and dry. No rashes noted. Psychiatric: Normal mood and affect. Behavior is normal.   ASSESSMENT AND PLAN: 60 y/o female with a history of UC, failed multiple regimens in the past, now on Stelara q 4 weeks and doing well, here for re-staging colonoscopy. At hospital for platelet disorder, at risk for bleeding, getting DDAVP infusion prior to colonoscopy her Hematology recommendations. Hematology also  recommends Tranexamic acid 2 tablets (1300 mg) by mouth 3 times a day for 5 days after colonoscopy if biopsies taken or polyps removed. Standard fluid restriction precautions of 1.5 liters oral and IV combined in on days Stimate(DDAVP) is used to avoid side effects of hyponatremia. I have discussed risks / benefits of anesthesia and colonoscopy with her, risks for bleeding / injuring her colon, she agrees and wishes to proceed, all questions answered.  Jolly Mango, MD [facility]

## 2021-03-23 ENCOUNTER — Other Ambulatory Visit: Payer: Self-pay

## 2021-03-23 DIAGNOSIS — Z79899 Other long term (current) drug therapy: Secondary | ICD-10-CM

## 2021-03-23 DIAGNOSIS — E871 Hypo-osmolality and hyponatremia: Secondary | ICD-10-CM

## 2021-03-23 DIAGNOSIS — D6800 Von Willebrand disease, unspecified: Secondary | ICD-10-CM

## 2021-03-23 DIAGNOSIS — K5100 Ulcerative (chronic) pancolitis without complications: Secondary | ICD-10-CM

## 2021-03-23 LAB — SURGICAL PATHOLOGY

## 2021-03-23 NOTE — Progress Notes (Signed)
Bmet ordered per SA

## 2021-03-24 ENCOUNTER — Other Ambulatory Visit (INDEPENDENT_AMBULATORY_CARE_PROVIDER_SITE_OTHER): Payer: BC Managed Care – PPO

## 2021-03-24 DIAGNOSIS — D6800 Von Willebrand disease, unspecified: Secondary | ICD-10-CM

## 2021-03-24 DIAGNOSIS — Z79899 Other long term (current) drug therapy: Secondary | ICD-10-CM | POA: Diagnosis not present

## 2021-03-24 DIAGNOSIS — E871 Hypo-osmolality and hyponatremia: Secondary | ICD-10-CM | POA: Diagnosis not present

## 2021-03-24 DIAGNOSIS — K5100 Ulcerative (chronic) pancolitis without complications: Secondary | ICD-10-CM

## 2021-03-24 LAB — BASIC METABOLIC PANEL
BUN: 14 mg/dL (ref 6–23)
CO2: 23 mEq/L (ref 19–32)
Calcium: 9.5 mg/dL (ref 8.4–10.5)
Chloride: 102 mEq/L (ref 96–112)
Creatinine, Ser: 0.79 mg/dL (ref 0.40–1.20)
GFR: 81.5 mL/min (ref >60.00)
Glucose, Bld: 123 mg/dL — ABNORMAL HIGH (ref 70–99)
Potassium: 3.2 mEq/L — ABNORMAL LOW (ref 3.5–5.1)
Sodium: 135 mEq/L (ref 135–145)

## 2021-04-01 ENCOUNTER — Other Ambulatory Visit: Payer: Self-pay

## 2021-04-01 DIAGNOSIS — Z79899 Other long term (current) drug therapy: Secondary | ICD-10-CM

## 2021-04-01 DIAGNOSIS — K5100 Ulcerative (chronic) pancolitis without complications: Secondary | ICD-10-CM

## 2021-04-04 ENCOUNTER — Other Ambulatory Visit: Payer: Self-pay

## 2021-04-04 MED ORDER — PREDNISONE 10 MG PO TABS: ORAL_TABLET | ORAL | 0 refills | Status: DC

## 2021-04-18 ENCOUNTER — Other Ambulatory Visit (INDEPENDENT_AMBULATORY_CARE_PROVIDER_SITE_OTHER): Payer: BC Managed Care – PPO

## 2021-04-18 DIAGNOSIS — K5100 Ulcerative (chronic) pancolitis without complications: Secondary | ICD-10-CM

## 2021-04-18 DIAGNOSIS — Z79899 Other long term (current) drug therapy: Secondary | ICD-10-CM

## 2021-04-18 LAB — CBC WITH DIFFERENTIAL/PLATELET
Basophils Absolute: 0 10*3/uL (ref 0.0–0.1)
Basophils Relative: 0.4 % (ref 0.0–3.0)
Eosinophils Absolute: 0.2 10*3/uL (ref 0.0–0.7)
Eosinophils Relative: 2.3 % (ref 0.0–5.0)
HCT: 38.5 % (ref 36.0–46.0)
Hemoglobin: 12.8 g/dL (ref 12.0–15.0)
Lymphocytes Relative: 39.5 % (ref 12.0–46.0)
Lymphs Abs: 2.6 10*3/uL (ref 0.7–4.0)
MCHC: 33.1 g/dL (ref 30.0–36.0)
MCV: 89.1 fl (ref 78.0–100.0)
Monocytes Absolute: 0.5 10*3/uL (ref 0.1–1.0)
Monocytes Relative: 7 % (ref 3.0–12.0)
Neutro Abs: 3.3 10*3/uL (ref 1.4–7.7)
Neutrophils Relative %: 50.8 % (ref 43.0–77.0)
Platelets: 238 10*3/uL (ref 150.0–400.0)
RBC: 4.32 Mil/uL (ref 3.87–5.11)
RDW: 13.8 % (ref 11.5–15.5)
WBC: 6.6 10*3/uL (ref 4.0–10.5)

## 2021-04-18 LAB — HEPATIC FUNCTION PANEL
ALT: 11 U/L (ref 0–35)
AST: 18 U/L (ref 0–37)
Albumin: 4 g/dL (ref 3.5–5.2)
Alkaline Phosphatase: 53 U/L (ref 39–117)
Bilirubin, Direct: 0.1 mg/dL (ref 0.0–0.3)
Total Bilirubin: 0.5 mg/dL (ref 0.2–1.2)
Total Protein: 7.5 g/dL (ref 6.0–8.3)

## 2021-04-25 ENCOUNTER — Other Ambulatory Visit: Payer: BC Managed Care – PPO

## 2021-04-25 ENCOUNTER — Ambulatory Visit (INDEPENDENT_AMBULATORY_CARE_PROVIDER_SITE_OTHER): Payer: BC Managed Care – PPO | Admitting: Gastroenterology

## 2021-04-25 ENCOUNTER — Encounter: Payer: Self-pay | Admitting: Gastroenterology

## 2021-04-25 VITALS — BP 138/84 | HR 58 | Ht 59.0 in | Wt 136.2 lb

## 2021-04-25 DIAGNOSIS — K5100 Ulcerative (chronic) pancolitis without complications: Secondary | ICD-10-CM

## 2021-04-25 NOTE — Patient Instructions (Addendum)
If you are age 60 or older, your body mass index should be between 23-30. Your Body mass index is 27.51 kg/m. If this is out of the aforementioned range listed, please consider follow up with your Primary Care Provider.  If you are age 67 or younger, your body mass index should be between 19-25. Your Body mass index is 27.51 kg/m. If this is out of the aformentioned range listed, please consider follow up with your Primary Care Provider.   __________________________________________________________  The [facility] providers would like to encourage you to use [facility] to communicate with providers for non-urgent requests or questions.  Due to long hold times on the telephone, sending your provider a message by [facility] may be a faster and more efficient way to get a response.  Please allow 48 business hours for a response.  Please remember that this is for non-urgent requests.   We will request a copy of your latest EKG from Dr. Coletta Memos.   Please go to the lab in the basement of our building to have lab work done as you leave today. Hit "B" for basement when you get on the elevator.  When the doors open the lab is on your left.  We will call you with the results. Thank you.   Thank you for entrusting me with your care and for choosing [facility], Dr. [place] Cellar

## 2021-04-25 NOTE — Progress Notes (Signed)
HPI :  Colitis History Left sided ulcerative colitis, diagnosed > 15 years ago. On Remicade in the past for her colitis, which she thinks she did well for a few years. She had a severe MRSA infection of her ear and ultimately Remicade was held and eventually was stopped given she had done well when it was held. She has never been on methotrexate. She has been on Lialda for a long time, prior to that Asacol. She has Von Willebrands. She bleeds easily but has never had significant bleeding / hemorrhages in the past. She has had DDAVP infusions prior to her last colonoscopy or Stilmate nose spray.  Started on [facility] April 2018 following a few flares that Spring. Failed Entyvio 2019, started Humira and 6MP Feb / March 2020. Failed Humira / 6 MP combination therapy 12/2018. Hepatoxicity to thiopurines - 6MMP levels > 50K. Transitioned to Stelara monotherapy 02/2019. Now PANCOLONIC colitis. Moderately active disease on every 4 weeks dosing of Stelara so it was also stopped.      SINCE LAST VISIT:   60 year old female here for a follow-up visit.  She was last seen for colonoscopy on July 26.  On that exam she had moderately active colitis in the left colon and mild elsewhere.  She had moderately active colitis on pathology results.  A few pseudo inflammatory polyps removed.  She was on Stelara every 4-week dosing at that time.  She has since stopped it.  I gave her a prednisone taper to take while she stopped Stelara however she never took the prednisone as she has been feeling surprisingly well.  She in fact states after her Stelara injection she would feel a bit worse lately which surprised her.  Since she has been off Stelara she states she is actually been feeling better with more regular bowel habits.  No abdominal pains.  No blood in her stools.  We had discussed other options to include [place].  She is a bit wary of the side effects of Xeljanz particularly blood clots and wanted to consider  Zeposia.  She had an EKG with her primary care which have not seen yet and she also had an eye exam which showed no evidence of macular edema.  She endorses a history of SVT years ago and had an ablation greater than 10 years ago.  This has controlled it well but she has some mild sinus bradycardia with resting heart rates in the 50s to 60s.  Otherwise feels well without complaints today.     IBD Health Care Maintenance: Annual Flu Vaccine - Date due: 2022 UTD Pneumococcal Vaccine if receiving immunosuppression: - Date PCV 13 - 05/21/2018, PPSV 23 05/01/19 Zoster vaccine i12/18/20, 05/17/19 COVID VACCINE UTD TB testing if on anti-TNF, yearly - negative 05/2020 Vitamin D screening - Date 11/08/20 - level of 49.18 Last Colonoscopy :   Colonoscopy 03/22/21 - The examined portion of the ileum was normal. - Moderately active (Mayo Score 2) ulcerative colitis in the rectum / rectosigmoid colon. Biopsied. - Mild (Mayo Score 1) ulcerative colitis thoughout elsewhere. Biopsied. - One 6 mm polyp in the distal transverse colon, removed with a hot snare. Resected and retrieved. Clips were placed. - One 2 to 3 mm polyp at the recto-sigmoid colon, removed with a cold biopsy forceps. Resected and retrieved. - The examination was otherwise normal. Unfortunately active inflammation despite Stelara q 4 weeks dosing. Will discuss options with the patient.   FINAL MICROSCOPIC DIAGNOSIS:   A. COLON, RIGHT,  BIOPSY:  - Chronic moderately active colitis.  - No dysplasia or malignancy.   B. COLON, TRANSVERSE, BIOPSY:  - Chronic moderately active colitis.  - No dysplasia or malignancy.   C. COLON, TRANSVERSE, POLYPECTOMY:  - Inflammatory pseudopolyp.  - No dysplasia or malignancy.   D. COLON, LEFT, BIOPSY:  - Chronic mildly active colitis.  - No dysplasia or malignancy.   E. COLON, RECTOSIGMOID, RECTUM, BIOPSY:  - Chronic moderately active colitis.  - No dysplasia or malignancy.   F. RECTUM,  POLYPECTOMY:  - Inflammatory pseudopolyp.  - No dysplasia or malignancy.    Colonoscopy 01/15/20 - The examined portion of the ileum was normal. - One 3 to 4 mm polyp in the sigmoid colon, removed with a cold snare. Resected and retrieved. - One 3 mm polyp at the recto-sigmoid colon, removed with a cold biopsy forceps. Resected and retrieved. - Mild (Mayo Score 1) ulcerative colitis of the distal 20cm, otherwise no other inflammatory changes noted. Overall, significantly improved since the last examination. Biopsied. - Biopsies for surveillance were taken from the right colon, left colon, transverse colon, and Rectum.    Colonoscopy 01/24/2019 - severe left sided inflammation, mild in transverse and right colon, normal ileum. Benign inflammatory polyp of the cecum   Colonoscopy 2014 - active rectal inflammation, inflammatory polyp Colonoscopy 09/21/2015 - overall good control of colitis with small mild area of inflammation in left colon, mild chronic colitis Colonoscopy 10/30/2017 - normal ileum, 10-15cm segment of mild inflammation, otherwise normal - mildly active colitis      Past Medical History:  Diagnosis Date   Allergy    Anemia    Anxiety    Cellulitis of left breast 09/17/2015   diagnosed at Urgent Care- on ABX   Clostridium difficile diarrhea    Clotting disorder ([place])    Depression    takes Prozac daily   Hearing loss    right ear   History of colon polyps    benign   Hyperlipidemia    Hypertension    takes Metoprolol daily   Hypokalemia    takes Potassium every other day   Inflammatory polyps of colon ([place])    Internal hemorrhoids    Mucosal abnormality of stomach    Osteopenia    Osteoporosis    Personal history of meningioma of the brain    Platelet disorder (HCC)    Pneumonia    hx of-about 4+ yrs ago   Seasonal allergies    takes Allegra as needed and Flonase daily   SVT (supraventricular tachycardia) (HCC)    Tennis elbow    Ulcerative colitis     On Entyvio q 4 week dosing   Urinary urgency    Vitamin B12 deficiency    Von Willebrands disease ([place])      Past Surgical History:  Procedure Laterality Date   BAHA REVISION Right 03/15/2016   Procedure: REVISION RIGHT BAHA HEARING IMPLANT ;  Surgeon: Vicie Mutters, MD;  Location: [facility];  Service: ENT;  Laterality: Right;   BIOPSY  01/24/2019   Procedure: BIOPSY;  Surgeon: Yetta Flock, MD;  Location: [facility];  Service: Gastroenterology;;   BIOPSY  01/15/2020   Procedure: BIOPSY;  Surgeon: Yetta Flock, MD;  [facility];  Service: Gastroenterology;;   BIOPSY  03/22/2021   Procedure: BIOPSY;  Surgeon: Yetta Flock, MD;  [facility]: [facility];  Service: Gastroenterology;;   BRAIN MENINGIOMA EXCISION  05/2007   CARDIAC ELECTROPHYSIOLOGY STUDY AND ABLATION  10/22/09  COLONOSCOPY N/A 07/29/2013   Procedure: COLONOSCOPY;  Surgeon: Lafayette Dragon, MD;  [facility]: [facility];  Service: Endoscopy;  Laterality: N/A;   COLONOSCOPY WITH PROPOFOL N/A 09/21/2015   Procedure: COLONOSCOPY WITH PROPOFOL;  Surgeon: Manus Gunning, MD;  [facility];  Service: Gastroenterology;  Laterality: N/A;   COLONOSCOPY WITH PROPOFOL N/A 10/30/2017   Procedure: COLONOSCOPY WITH PROPOFOL;  Surgeon: Yetta Flock, MD;  [facility];  Service: Gastroenterology;  Laterality: N/A;   COLONOSCOPY WITH PROPOFOL N/A 01/24/2019   Procedure: COLONOSCOPY WITH PROPOFOL;  Surgeon: Yetta Flock, MD;  [facility];  Service: Gastroenterology;  Laterality: N/A;   COLONOSCOPY WITH PROPOFOL N/A 01/15/2020   Procedure: COLONOSCOPY WITH PROPOFOL;  Surgeon: Yetta Flock, MD;  [facility];  Service: Gastroenterology;  Laterality: N/A;  needs DDAVP 30-60 minutes prior to procedure.   COLONOSCOPY WITH PROPOFOL N/A 03/22/2021   Procedure: COLONOSCOPY WITH PROPOFOL;  Surgeon: Yetta Flock, MD;  [facility];  Service:  Gastroenterology;  Laterality: N/A;   ESOPHAGOGASTRODUODENOSCOPY     HEMOSTASIS CLIP PLACEMENT  03/22/2021   Procedure: HEMOSTASIS CLIP PLACEMENT;  Surgeon: Yetta Flock, MD;  [facility];  Service: Gastroenterology;;   IMPLANTATION BONE ANCHORED HEARING AID Right    POLYPECTOMY  01/24/2019   Procedure: POLYPECTOMY;  Surgeon: Yetta Flock, MD;  [facility];  Service: Gastroenterology;;   POLYPECTOMY  01/15/2020   Procedure: POLYPECTOMY;  Surgeon: Yetta Flock, MD;  [facility];  Service: Gastroenterology;;   POLYPECTOMY  03/22/2021   Procedure: POLYPECTOMY;  Surgeon: Yetta Flock, MD;  [facility];  Service: Gastroenterology;;   WISDOM TOOTH EXTRACTION     Family History  Problem Relation Age of Onset   Heart disease Maternal Grandfather    Heart disease Mother    Stroke Mother    Diabetes Maternal Uncle    Colon cancer Neg Hx    Breast cancer Neg Hx    Esophageal cancer Neg Hx    Rectal cancer Neg Hx    Stomach cancer Neg Hx    Social History   Tobacco Use   Smoking status: Never    Passive exposure: Never   Smokeless tobacco: Never  Vaping Use   Vaping Use: Never used  Substance Use Topics   Alcohol use: No    Alcohol/week: 0.0 standard drinks   Drug use: No   Current Outpatient Medications  Medication Sig Dispense Refill   Cholecalciferol (VITAMIN D3) 125 MCG (5000 UT) CAPS Take 5,000 Units by mouth daily.     clotrimazole-betamethasone (LOTRISONE) cream Apply 1 application topically daily as needed (Eczema or inflamed in skull).     FLUoxetine (PROZAC) 20 MG capsule Take 20 mg by mouth daily 90 capsule 0   hydrocortisone 2.5 % ointment Apply 1 application topically 2 (two) times daily as needed (for eczema).     mesalamine (ROWASA) 4 g enema Place 60 mLs (4 g total) rectally at bedtime for 30 doses. Then as needed thereafter (Patient taking differently: Place 4 g rectally at bedtime as needed  (Colitis).) 1800 mL 1   metoprolol succinate (TOPROL-XL) 50 MG 24 hr tablet Take 1 tablet (50 mg total) by mouth daily. Take with or immediately following a meal. 90 tablet 0   NON FORMULARY Take 1 tablet by mouth daily. Terrazyme (Essential Oil)     Omega 3 1000 MG CAPS Take 1,000 mg by mouth daily.     OVER THE COUNTER MEDICATION  Take 1 capsule by mouth daily. Doterra  Alpha CRS +     OVER THE COUNTER MEDICATION Take 1 tablet by mouth daily. Doterra Dr-prime culture complex     Current Facility-Administered Medications  Medication Dose Route Frequency Provider Last Rate Last Admin   desmopressin (DDAVP) 23.6 mcg in sodium chloride 0.9 % IV Syringe  0.3 mcg/kg Intravenous Once [place] Pallas, Carlota Raspberry, MD       Allergies  Allergen Reactions   Atenolol Swelling   Asa [Aspirin]     Platelet disorder    Cephalexin Other (See Comments)    Upset her colitis    Corn Starch Other (See Comments)    Gi-upset  Allergy test   Ibuprofen     Platelet disrder   Mercaptopurine Hives and Itching    Elevated liver levels   Nsaids Other (See Comments)    PLATELET DISORDER    Soy Allergy Other (See Comments)    GI symptoms      Review of Systems: All systems reviewed and negative except where noted in HPI.   Lab Results  Component Value Date   WBC 6.6 04/18/2021   HGB 12.8 04/18/2021   HCT 38.5 04/18/2021   MCV 89.1 04/18/2021   PLT 238.0 04/18/2021    Lab Results  Component Value Date   CREATININE 0.79 03/24/2021   BUN 14 03/24/2021   NA 135 03/24/2021   K 3.2 (L) 03/24/2021   CL 102 03/24/2021   CO2 23 03/24/2021    Lab Results  Component Value Date   ALT 11 04/18/2021   AST 18 04/18/2021   ALKPHOS 53 04/18/2021   BILITOT 0.5 04/18/2021     Physical Exam: BP 138/84   Pulse (!) 58   Ht 4' 11"  (1.499 m)   Wt 136 lb 3.2 oz (61.8 kg)   LMP 11/26/2008   SpO2 99%   BMI 27.51 kg/m  Constitutional: Pleasant,well-developed, female in no acute distress. Neurological:  Alert and oriented to person place and time. Psychiatric: Normal mood and affect. Behavior is normal.   ASSESSMENT AND PLAN: 60 year old female here for reassessment of the following:  Ulcerative Pancolitis   History as above.  Remotely did okay on Remicade but developed infectious complication and stopped it years ago.  Since then she has failed Humira.  Had hepatotoxicity with thiopurine's and stopped them.  She failed Entyvio and most recently high-dose Stelara dose that every 4 weeks, have moderately active disease on that regimen.  She has had some flares over the past few years.  Interestingly now that she has held Delsa Grana has been feeling fairly well in recent weeks.  We discussed options.  Given her longstanding disease and failure of multiple regimens, I do think she will continue to flare and feel poorly unless she is on some sort of maintenance therapy.  We discussed options at this point to include Morrie Sheldon, Rinvoq, Zeposia of the regimens she has not tried yet, vs retrying Remicade vs. colectomy.  Overall given safety profile of Zeposia that is her preference if she is a candidate.  Her eye exam was negative.  I need to get a copy of her EKG but I will have this reviewed by the cardiologist at [facility] as she has some mild sinus bradycardia.  I do not think this will be a contraindication for her to start it but will double check.  We discussed risks of medication otherwise and she is agreeable to start when cleared.  She otherwise is asking  for a baseline fecal calprotectin while she is off therapy so she can use this to monitor over time.  She is also curious if she is in remission given she is feeling well off therapy but I think I would be unlikely.  Plan: - fecal calprotectin to have baseline prior to starting next therapy - get copy of EKG and startup paperwork for Zeposia and send to them for review and start therapy if cleared from cardiology review - will await to hear back and  start therapy soon hopefully. She will use prednisone in the interim if any flare of symptoms.  Jolly Mango, MD [facility]

## 2021-04-29 ENCOUNTER — Other Ambulatory Visit: Payer: BC Managed Care – PPO

## 2021-04-29 DIAGNOSIS — K5100 Ulcerative (chronic) pancolitis without complications: Secondary | ICD-10-CM

## 2021-05-05 LAB — CALPROTECTIN, FECAL: Calprotectin, Fecal: 90 ug/g (ref 0–120)

## 2021-05-06 ENCOUNTER — Telehealth: Payer: Self-pay

## 2021-05-06 NOTE — Telephone Encounter (Signed)
-----   Message from Yetta Flock, MD sent at 05/06/2021  7:49 AM EDT ----- Regarding: RE: Henrietta Dine Thanks [name]. I think we can submit the paperwork AND the EKG for approval. The cardiologist at [facility] will review the EKG and if cleared we can start it for her. Thanks  ----- Message ----- From: Roetta Sessions, CMA Sent: 05/05/2021  12:54 PM EDT To: Yetta Flock, MD Subject: Henrietta Dine                                        Is she ready to start zeposia?  I rec'd the EKG and put it on your desk.   Are we waiting on something else?  Thanks, [name]

## 2021-05-06 NOTE — Telephone Encounter (Signed)
Zeposia Starter Form and all supporting documentation including EKG, faxed to [employer]. Once EKG has been approved (sinus bradycardia, pulse 52) patient will be initiated on 7 day Starter Pack.

## 2021-05-11 NOTE — Telephone Encounter (Signed)
Inbound call from [place], Henrietta Dine representation. States have not been able to get in contact with patient to get necessary information for the patient to begin. Best contact number [phone] option 2, then option 3 for the patient to contact nurse.  Also, states only the doctor office can give approval for starting medication.   Prior authorization is also required for maintenance pill 0.35m and have to be submitted directly [phone]

## 2021-05-11 NOTE — Telephone Encounter (Signed)
Spoke with patient and she will call Zeposia back gave her the numbers and number options

## 2021-05-16 NOTE — Telephone Encounter (Signed)
Called Zeposia to check on EKG that was faxed on 9-9 with Starter Form and then again on 9-13. Malena Edman indicated that the [facility] will look into the EKG.   Program Manager is now waiting on a response from [employer]. She indicates that [place], from [employer] will reach out to me today.

## 2021-05-16 NOTE — Telephone Encounter (Signed)
Kyle from [employer] called (cell : [phone]) and indicated that the Cardiologist has reviewed EKG. He will fax the interpretation which indicates that all factors for Zeposia start have been satisfied and are outlined in the overread. ("Sinus rhythm without AV Block with a normal Qtc of 418 msec"). He will fax to [place].  We discussed that [employer] is the company that performs the in home services such as blood work, ECG and eye test for macular edema.  They also perform the ECG overread.  The number is [phone] option 1 for in home services and option 2 for ECG questions. The [employer] portal is located at [email or web address] and is the same user name and login as the app login ([employer])  [facility] device is part of [employer]  and all questions can be sent to them.  Note while on the app only the MRN can be identified with an [place] but you can edit the patient  information within the portal to include name and dob.

## 2021-05-17 ENCOUNTER — Telehealth: Payer: Self-pay

## 2021-05-17 NOTE — Telephone Encounter (Signed)
[name] after further thought and reviewing things with you about this case. She has some sinus bradycardia which could worsen with Zeposia. She does take metoprolol 67m XL per day and perhaps that is either too much for her or she does not need to be on it. I would propose that the patient ask her PCP or her cardiologist if she can stop the Metoprolol and monitor her HR. I am assuming it will increase and perhaps she does not need the metoprolol. Not sure if she is taking this for her HTN or something else at this point. I don't see any recent cardiology notes in recent years, I think she should contact her PCP about this. If they are okay with holding her metoprolol then let's watch her HR a week or 2 off it and if it normalizes, we can then proceed with Zeposia. Thank you for your help with this matter

## 2021-05-17 NOTE — Telephone Encounter (Signed)
EKG performed on 04-08-21 at patient's primary care office.  Copy of EKG received via fax (Rate: 52, PR: 156,  QT: 438, QTcB: 407, QRSD: 89,  P-QRS-T: 53/84/76) and sent to the [facility] 360 Support team for review. According to representative,  EKG will need to be faxed to [place] if we require Cardiology over-read. EKG faxed to [employer]. Technical interpretation provided by Dr. Jillyn Ledger at [facility] on 05-16-21:  "Sinus rhythm without AV Block with a normal Qtc of 418 msec." Please advise if patient is cleared to initiate Zeposia for Ulcerative Colitis.

## 2021-05-17 NOTE — Telephone Encounter (Signed)
Called and left detailed message for patient regarding update. Dr. Havery Moros will reach out to Dr. Caryl Comes, patient's Cardiologist, to discuss patient's taking Zeposia.

## 2021-05-18 NOTE — Telephone Encounter (Signed)
Called patient and LM for her to call back to discuss Dr. Doyne Keel recommendations.

## 2021-05-18 NOTE — Telephone Encounter (Signed)
Called and spoke to patient.  She will contact Harrison Mons, PCP and discuss holding metoprolol and monitoring hear rate. She wants Dr. Havery Moros to know that she is scheduled for a flu and Covid shot tomorrow and wondered how long after she would need to wait before she starts Zeposia anyway. (Only live vaccines should effect initiation).The biggest issue for her is that she is feeling really well since stopping Stelara and given that her latest Fecal calprotectin was 90, she is not necessarily wanting to start a new medication until she develops Symptoms but she appreciates the importance of going through the process to be cleared for Zeposia should the need arise. She will let us know once she makes contact with Chelle regarding metoprolol. Juluis Rainier, she only saw Dr. Caryl Comes for her ablation in 2011 and has not seen him since.  Her Cardiologist was Dr. Daneen Schick but she has not seen him in years since she has not had any issues).

## 2021-05-18 NOTE — Telephone Encounter (Signed)
Patient returned call. Contact number [phone]

## 2021-05-18 NOTE — Telephone Encounter (Signed)
REMINDER PLACED

## 2021-05-18 NOTE — Telephone Encounter (Signed)
[name] thank you very much. Okay to have flu and COVID shots. I am concerned she will likely flare which could be severe if she does not take therapy for her colitis.  Hopefully she is okay to hold the metoprolol and keep an eye on her HR. If she does okay with this will discuss if she is ready to start [place]. I'd like to touch base with her in 1-2 weeks to reassess things if you can place a reminder for me in Epic? thanks

## 2021-05-23 ENCOUNTER — Other Ambulatory Visit: Payer: Self-pay | Admitting: Physician Assistant

## 2021-05-23 DIAGNOSIS — Z1231 Encounter for screening mammogram for malignant neoplasm of breast: Secondary | ICD-10-CM

## 2021-05-30 ENCOUNTER — Telehealth: Payer: Self-pay

## 2021-05-30 NOTE — Telephone Encounter (Signed)
-----   Message from Roetta Sessions, [place] sent at 05/18/2021  4:39 PM EDT ----- Regarding: check for update Check for update from patient regarding holding metoprolol and monitoring heartrate.  [place] to discuss [employer]

## 2021-05-30 NOTE — Telephone Encounter (Signed)
Dr. Colbert Coyer - please see phone message from today regarding holding metoprolol and monitoring heartrate

## 2021-05-30 NOTE — Telephone Encounter (Signed)
Called and left message for patient to call back or MyChart with an update

## 2021-05-30 NOTE — Telephone Encounter (Signed)
Im sorry. It was a Therapist, music.  I have pasted it below:    Hi [name], Hope you are well.  I have copied/pasted conversations between Happy and me.     Hope you have a blessed day! Yasmyn ________________________________________ Lemar Livings,      Dr. Luvenia Starch is investigating Zeposia as a txmt option for ulcerative colitis.  One side effect of this medication is that it lowers the heart rate.  I have a low heart rate due to  having a heart ablation procedure.       He asked that I reach out to you and ask your thoughts on the following:  stop taking the METOPROLOL SUCCINATE 50 MG 24 HR TABLET for two weeks, me monitor my blood pressure and heart rate during this time to see if the medication is still needed and to see if my heart rate normalizes .   Warm regards, Lauren [name] 1 new message below: 1 New message Message from Harrison Mons, sent September 30 at 2:27 PM   [facility] Sep 30, 2:27 PM Vaughan Basta,   I think that sounds like a good plan. Let me know what your blood pressure and pulse are like off the metoprolol.   Warmly, Chelle Showing 2 of 2

## 2021-06-14 NOTE — Telephone Encounter (Signed)
Called and spoke to patient. Relayed Dr. Doyne Keel comments and recommendations. She would like to monitor her vitals for another week and discuss with her PCP.  She will let us know what they decide. She had other questions which we answered; I let her know that we have not completed the PA for Zeposia until she has decided to start on it. I advised her that we have a started sample for her that will last 37 days.  She expressed understanding and will get back to [place] in a week or two.

## 2021-06-27 ENCOUNTER — Ambulatory Visit
Admission: RE | Admit: 2021-06-27 | Discharge: 2021-06-27 | Disposition: A | Discharge status: Home / Self Care | Payer: BC Managed Care – PPO | Source: Ambulatory Visit | Attending: Physician Assistant | Admitting: Physician Assistant

## 2021-06-27 DIAGNOSIS — Z1231 Encounter for screening mammogram for malignant neoplasm of breast: Secondary | ICD-10-CM

## 2021-07-27 ENCOUNTER — Other Ambulatory Visit (INDEPENDENT_AMBULATORY_CARE_PROVIDER_SITE_OTHER): Payer: BC Managed Care – PPO

## 2021-07-27 ENCOUNTER — Encounter: Payer: Self-pay | Admitting: Gastroenterology

## 2021-07-27 ENCOUNTER — Ambulatory Visit (INDEPENDENT_AMBULATORY_CARE_PROVIDER_SITE_OTHER): Payer: BC Managed Care – PPO | Admitting: Gastroenterology

## 2021-07-27 VITALS — BP 130/90 | HR 70 | Ht 59.0 in | Wt 136.0 lb

## 2021-07-27 DIAGNOSIS — K5100 Ulcerative (chronic) pancolitis without complications: Secondary | ICD-10-CM

## 2021-07-27 DIAGNOSIS — Z79899 Other long term (current) drug therapy: Secondary | ICD-10-CM

## 2021-07-27 LAB — CBC WITH DIFFERENTIAL/PLATELET
Basophils Absolute: 0 10*3/uL (ref 0.0–0.1)
Basophils Relative: 0.6 % (ref 0.0–3.0)
Eosinophils Absolute: 0.1 10*3/uL (ref 0.0–0.7)
Eosinophils Relative: 1.4 % (ref 0.0–5.0)
HCT: 40.9 % (ref 36.0–46.0)
Hemoglobin: 13.8 g/dL (ref 12.0–15.0)
Lymphocytes Relative: 36 % (ref 12.0–46.0)
Lymphs Abs: 2 10*3/uL (ref 0.7–4.0)
MCHC: 33.7 g/dL (ref 30.0–36.0)
MCV: 87.6 fl (ref 78.0–100.0)
Monocytes Absolute: 0.4 10*3/uL (ref 0.1–1.0)
Monocytes Relative: 7.6 % (ref 3.0–12.0)
Neutro Abs: 3 10*3/uL (ref 1.4–7.7)
Neutrophils Relative %: 54.4 % (ref 43.0–77.0)
Platelets: 221 10*3/uL (ref 150.0–400.0)
RBC: 4.67 Mil/uL (ref 3.87–5.11)
RDW: 13.1 % (ref 11.5–15.5)
WBC: 5.5 10*3/uL (ref 4.0–10.5)

## 2021-07-27 LAB — HEPATIC FUNCTION PANEL
ALT: 11 U/L (ref 0–35)
AST: 15 U/L (ref 0–37)
Albumin: 4.4 g/dL (ref 3.5–5.2)
Alkaline Phosphatase: 61 U/L (ref 39–117)
Bilirubin, Direct: 0 mg/dL (ref 0.0–0.3)
Total Bilirubin: 0.4 mg/dL (ref 0.2–1.2)
Total Protein: 7.9 g/dL (ref 6.0–8.3)

## 2021-07-27 MED ORDER — RINVOQ 45 MG PO TB24: 45.0000 mg | ORAL_TABLET | Freq: Every day | ORAL | 1 refills | Status: DC

## 2021-07-27 NOTE — Patient Instructions (Addendum)
If you are age 60 or older, your body mass index should be between 23-30. Your Body mass index is 27.47 kg/m. If this is out of the aforementioned range listed, please consider follow up with your Primary Care Provider.  If you are age 8 or younger, your body mass index should be between 19-25. Your Body mass index is 27.47 kg/m. If this is out of the aformentioned range listed, please consider follow up with your Primary Care Provider.   ________________________________________________________  The [facility] providers would like to encourage you to use [facility] to communicate with providers for non-urgent requests or questions.  Due to long hold times on the telephone, sending your provider a message by [facility] may be a faster and more efficient way to get a response.  Please allow 48 business hours for a response.  Please remember that this is for non-urgent requests.  _______________________________________________________   Please go to the lab in the basement of our building to have lab work done as you leave today. Hit "B" for basement when you get on the elevator.  When the doors open the lab is on your left.  We will call you with the results. Thank you.   We have sent the following medications to your pharmacy for you to pick up at your convenience: Rinvoq 45 mg: Take once a day for 8 weeks  Thank you for entrusting me with your care and for choosing [employer], Dr. [place]

## 2021-07-27 NOTE — Progress Notes (Signed)
HPI :  Colitis History Initially left sided ulcerative colitis, diagnosed > 15 years ago. On Remicade in the past for her colitis, which she thinks she did well for a few years. She had a severe MRSA infection of her ear and ultimately Remicade was held and eventually was stopped given she had done well when it was held. She has never been on methotrexate. She has been on Lialda for a long time, prior to that Asacol. She has [place]. She bleeds easily but has never had significant bleeding / hemorrhages in the past. She has had DDAVP infusions prior to her last colonoscopy or Stilmate nose spray.  Started on [employer] April 2018 following a few flares that Spring. Failed Entyvio 2019, started Humira and 6MP Feb / March 2020. Failed Humira / 6 MP combination therapy 12/2018. Hepatoxicity to thiopurines - 6MMP levels > 50K. Transitioned to Stelara monotherapy 02/2019. Now PANCOLONIC colitis. Moderately active disease on every 4 weeks dosing of Stelara so it was also stopped summer 2022. Noted to have pancolitis at that time.     SINCE LAST VISIT:   60 year old female here for follow-up visit for her colitis.  Recall that she has failed multiple regimens as outlined above.  Previously on high-dose Stelara (every 4-week dosing) had moderately active colitis on that regimen over the summer and it was stopped around August timeframe.  She has not been on any colitis specific regimen since that time.  We had initially discussed [place] versus Zeposia.  She had an EKG checked and had some resting bradycardia around 60 or so.  She was on metoprolol and that has been held and her resting heart rates been around 70s to 80s.  She has been cleared for zeposia but has been a bit anxious about starting this given her cardiovascular history (SVT post ablation with some resting bradycardia on Toprol).  She generally has not had any significant flares of disease but has had what she thought a mild flare about 6 to 8  weeks ago, had 1 weeks worth of loose stools, urgency.  This seems to have resolved and she states she has mostly solid stools without any abdominal pains.  She is generally feeling okay.  Her immunizations are up-to-date, she has completed her COVID booster.  We discussed if she wanted to pursue additional therapy for her colitis.  She has failed multiple regimens and has had colitis for many years now.  We discussed her options, specifically the newest regimen Rinvoq She had a borderline fecal calprotectin September, level of 90.    IBD Health Care Maintenance: Annual Flu Vaccine - Date due: 2022 UTD Pneumococcal Vaccine if receiving immunosuppression: - Date PCV 13 - 05/21/2018, PPSV 23 05/01/19 Zoster vaccine i12/18/20, 05/17/19 COVID VACCINE UTD TB testing if on anti-TNF, yearly - negative 05/2020 Vitamin D screening - Date 11/08/20 - level of 49.18 Last Colonoscopy :   Colonoscopy 03/22/21 - The examined portion of the ileum was normal. - Moderately active (Mayo Score 2) ulcerative colitis in the rectum / rectosigmoid colon. Biopsied. - Mild (Mayo Score 1) ulcerative colitis thoughout elsewhere. Biopsied. - One 6 mm polyp in the distal transverse colon, removed with a hot snare. Resected and retrieved. Clips were placed. - One 2 to 3 mm polyp at the recto-sigmoid colon, removed with a cold biopsy forceps. Resected and retrieved. - The examination was otherwise normal. Unfortunately active inflammation despite Stelara q 4 weeks dosing. Will discuss options with the patient.  FINAL MICROSCOPIC DIAGNOSIS:   A. COLON, RIGHT, BIOPSY:  - Chronic moderately active colitis.  - No dysplasia or malignancy.   B. COLON, TRANSVERSE, BIOPSY:  - Chronic moderately active colitis.  - No dysplasia or malignancy.   C. COLON, TRANSVERSE, POLYPECTOMY:  - Inflammatory pseudopolyp.  - No dysplasia or malignancy.   D. COLON, LEFT, BIOPSY:  - Chronic mildly active colitis.  - No dysplasia or  malignancy.   E. COLON, RECTOSIGMOID, RECTUM, BIOPSY:  - Chronic moderately active colitis.  - No dysplasia or malignancy.   F. RECTUM, POLYPECTOMY:  - Inflammatory pseudopolyp.  - No dysplasia or malignancy.      Colonoscopy 01/15/20 - The examined portion of the ileum was normal. - One 3 to 4 mm polyp in the sigmoid colon, removed with a cold snare. Resected and retrieved. - One 3 mm polyp at the recto-sigmoid colon, removed with a cold biopsy forceps. Resected and retrieved. - Mild (Mayo Score 1) ulcerative colitis of the distal 20cm, otherwise no other inflammatory changes noted. Overall, significantly improved since the last examination. Biopsied. - Biopsies for surveillance were taken from the right colon, left colon, transverse colon, and Rectum.    Colonoscopy 01/24/2019 - severe left sided inflammation, mild in transverse and right colon, normal ileum. Benign inflammatory polyp of the cecum   Colonoscopy 2014 - active rectal inflammation, inflammatory polyp Colonoscopy 09/21/2015 - overall good control of colitis with small mild area of inflammation in left colon, mild chronic colitis Colonoscopy 10/30/2017 - normal ileum, 10-15cm segment of mild inflammation, otherwise normal - mildly active colitis     Fecal calprotectin 90 on 04/29/21      Past Medical History:  Diagnosis Date   Allergy    Anemia    Anxiety    Cellulitis of left breast 09/17/2015   diagnosed at Urgent Care- on ABX   Clostridium difficile diarrhea    Clotting disorder ([place])    Depression    takes Prozac daily   Hearing loss    right ear   History of colon polyps    benign   Hyperlipidemia    Hypertension    takes Metoprolol daily   Hypokalemia    takes Potassium every other day   Inflammatory polyps of colon ([place])    Internal hemorrhoids    Mucosal abnormality of stomach    Osteopenia    Osteoporosis    Personal history of meningioma of the brain    Platelet disorder ([place])    Pneumonia     hx of-about 4+ yrs ago   Seasonal allergies    takes Allegra as needed and Flonase daily   SVT (supraventricular tachycardia) (HCC)    Tennis elbow    Ulcerative colitis    On Entyvio q 4 week dosing   Urinary urgency    Vitamin B12 deficiency    Von Willebrands disease      Past Surgical History:  Procedure Laterality Date   BAHA REVISION Right 03/15/2016   Procedure: REVISION RIGHT BAHA HEARING IMPLANT ;  Surgeon: Vicie Mutters, MD;  Location: [place];  Service: ENT;  Laterality: Right;   BIOPSY  01/24/2019   Procedure: BIOPSY;  Surgeon: Yetta Flock, MD;  Location: [facility];  Service: Gastroenterology;;   BIOPSY  01/15/2020   Procedure: BIOPSY;  Surgeon: Yetta Flock, MD;  Location: [facility];  Service: Gastroenterology;;   BIOPSY  03/22/2021   Procedure: BIOPSY;  Surgeon: Yetta Flock, MD;  Location: [facility];  Service: Gastroenterology;;   BRAIN MENINGIOMA EXCISION  05/2007   CARDIAC ELECTROPHYSIOLOGY STUDY AND ABLATION  10/22/09   COLONOSCOPY N/A 07/29/2013   Procedure: COLONOSCOPY;  Surgeon: Lafayette Dragon, MD;  Location: [facility];  Service: Endoscopy;  Laterality: N/A;   COLONOSCOPY WITH PROPOFOL N/A 09/21/2015   Procedure: COLONOSCOPY WITH PROPOFOL;  Surgeon: Manus Gunning, MD;  Location: [facility];  Service: Gastroenterology;  Laterality: N/A;   COLONOSCOPY WITH PROPOFOL N/A 10/30/2017   Procedure: COLONOSCOPY WITH PROPOFOL;  Surgeon: Yetta Flock, MD;  Location: [facility];  Service: Gastroenterology;  Laterality: N/A;   COLONOSCOPY WITH PROPOFOL N/A 01/24/2019   Procedure: COLONOSCOPY WITH PROPOFOL;  Surgeon: Yetta Flock, MD;  Location: [facility];  Service: Gastroenterology;  Laterality: N/A;   COLONOSCOPY WITH PROPOFOL N/A 01/15/2020   Procedure: COLONOSCOPY WITH PROPOFOL;  Surgeon: Yetta Flock, MD;  Location: [facility];  Service: Gastroenterology;  Laterality: N/A;  needs DDAVP 30-60 minutes prior  to procedure.   COLONOSCOPY WITH PROPOFOL N/A 03/22/2021   Procedure: COLONOSCOPY WITH PROPOFOL;  Surgeon: Yetta Flock, MD;  Location: [facility];  Service: Gastroenterology;  Laterality: N/A;   ESOPHAGOGASTRODUODENOSCOPY     HEMOSTASIS CLIP PLACEMENT  03/22/2021   Procedure: HEMOSTASIS CLIP PLACEMENT;  Surgeon: Yetta Flock, MD;  Location: [facility];  Service: Gastroenterology;;   IMPLANTATION BONE ANCHORED HEARING AID Right    POLYPECTOMY  01/24/2019   Procedure: POLYPECTOMY;  Surgeon: Yetta Flock, MD;  Location: [facility];  Service: Gastroenterology;;   POLYPECTOMY  01/15/2020   Procedure: POLYPECTOMY;  Surgeon: Yetta Flock, MD;  Location[facility];  Service: Gastroenterology;;   POLYPECTOMY  03/22/2021   Procedure: POLYPECTOMY;  Surgeon: Yetta Flock, MD;  Location: [facility];  Service: Gastroenterology;;   WISDOM TOOTH EXTRACTION     Family History  Problem Relation Age of Onset   Heart disease Maternal Grandfather    Heart disease Mother    Stroke Mother    Diabetes Maternal Uncle    Colon cancer Neg Hx    Breast cancer Neg Hx    Esophageal cancer Neg Hx    Rectal cancer Neg Hx    Stomach cancer Neg Hx    Social History   Tobacco Use   Smoking status: Never    Passive exposure: Never   Smokeless tobacco: Never  Vaping Use   Vaping Use: Never used  Substance Use Topics   Alcohol use: No    Alcohol/week: 0.0 standard drinks   Drug use: No   Current Outpatient Medications  Medication Sig Dispense Refill   Cholecalciferol (VITAMIN D3) 125 MCG (5000 UT) CAPS Take 5,000 Units by mouth daily.     clotrimazole-betamethasone (LOTRISONE) cream Apply 1 application topically daily as needed (Eczema or inflamed in skull).     FLUoxetine (PROZAC) 20 MG capsule Take 20 mg by mouth daily 90 capsule 0   hydrocortisone 2.5 % ointment Apply 1 application topically 2 (two) times daily as needed (for eczema).     NON FORMULARY Take 1  tablet by mouth daily. Ter[employer]zyme (Essential Oil)     Omega 3 1000 MG CAPS Take 1,000 mg by mouth daily.     OVER THE COUNTER MEDICATION Take 1 capsule by mouth daily. Doterra  Alpha CRS +     OVER THE COUNTER MEDICATION Take 1 tablet by mouth daily. Doterra Dr-prime culture complex     Current Facility-Administered Medications  Medication Dose Route Frequency Provider Last Rate Last Admin  desmopressin (DDAVP) 23.6 mcg in sodium chloride 0.9 % IV Syringe  0.3 mcg/kg Intravenous Once Angelisse Riso, Carlota Raspberry, MD       Allergies  Allergen Reactions   Atenolol Swelling   Asa [Aspirin]     Platelet disorder    Cephalexin Other (See Comments)    Upset her colitis    Corn Starch Other (See Comments)    Gi-upset  Allergy test   Ibuprofen     Platelet disrder   Mercaptopurine Hives and Itching    Elevated liver levels   Nsaids Other (See Comments)    PLATELET DISORDER    Soy Allergy Other (See Comments)    GI symptoms      Review of Systems: All systems reviewed and negative except where noted in HPI.    Lab Results  Component Value Date   WBC 6.6 04/18/2021   HGB 12.8 04/18/2021   HCT 38.5 04/18/2021   MCV 89.1 04/18/2021   PLT 238.0 04/18/2021    Lab Results  Component Value Date   ALT 11 04/18/2021   AST 18 04/18/2021   ALKPHOS 53 04/18/2021   BILITOT 0.5 04/18/2021    Lab Results  Component Value Date   CREATININE 0.79 03/24/2021   BUN 14 03/24/2021   NA 135 03/24/2021   K 3.2 (L) 03/24/2021   CL 102 03/24/2021   CO2 23 03/24/2021     Physical Exam: BP 130/90   Pulse 70   Ht 4' 11"  (1.499 m)   Wt 136 lb (61.7 kg)   LMP 11/26/2008   BMI 27.47 kg/m  Constitutional: Pleasant,well-developed, female in no acute distress. Neurological: Alert and oriented to person place and time. Psychiatric: Normal mood and affect. Behavior is normal.   ASSESSMENT AND PLAN: 60 year old female here for reassessment of the following:  Ulcerative pancolitis High  risk medication use  History as above, has failed multiple regimens over the past several years to include anti-TNF, Entyvio, Stelara, thiopurine's.  Most recently had active colitis despite every 4-week dosing of Stelara.  She surprisingly is feeling pretty well at this point time.  We discussed if she wants to proceed with colitis therapy now or continue to monitor.  I am concerned she is very high risk for continued active disease, flares, and increased risk for colon cancer without any therapy, she understands this and shares my concern and wants to proceed with therapy.  At her last visit we discussed other options to include Jak 2 inhibitors versus Zeposia, her preference at that time was to consider Henrietta Dine however she is a bit anxious about the cardiovascular risks with this.  She has had some bradycardia in the past following ablation for SVT.  Had been on Toprol as well with resting bradycardia in the 60s.  Has since held Toprol and her resting heart has come up but she states she may need to go back on beta-blocker or calcium channel blocker to treat her blood pressure and heart rate.  Overall, after talking with her about Zeposia her preference would be to consider another option.  In this light I think Rinvoq is her best choice at this point.  We discussed risks for this to include infection, and theoretical increased risk for thrombosis and that was seen in studies with Morrie Sheldon and not necessarily Rinvoq.  Also listed is as risks are increased cardiovascular events and risk for lymphoma. We discussed this for a bit of time and she is agreeable to start the Rinvoq.  I  will check quantiferon gold, baseline CBC, and LFTs.  If these look good we will plan on starting 45 mg daily for 8 weeks and then transition to maintenance dosing at 50 mg daily.  We will repeat CBC and LFTs 1 month after starting therapy and lipid panel at 12 weeks.  Hopefully she tolerates this well, will contact me in the interim  with any concerns.  Plan: - labs today - Quantiferon gold, CBC and LFTs - plan on starting Rinvoq 73m / day for 8 weeks, then 123m/ day for maintenance if tolerated.  - will need CBC and LFTs 1 month after starting Rinvoq, and lipid panel in 12 weeks after starting - anticipate if she starts this and does well, consider colonoscopy in 6-9 months for assessment for mucosal healing  StJolly MangoMD [facility]

## 2021-07-28 ENCOUNTER — Telehealth: Payer: Self-pay

## 2021-07-28 DIAGNOSIS — K5100 Ulcerative (chronic) pancolitis without complications: Secondary | ICD-10-CM

## 2021-07-28 DIAGNOSIS — Z79899 Other long term (current) drug therapy: Secondary | ICD-10-CM

## 2021-07-28 NOTE — Telephone Encounter (Signed)
Prior Auth submitted via CoverMyMeds

## 2021-07-28 NOTE — Telephone Encounter (Signed)
[employer]Pro activated for patient to initiate on RINVOQ.  Awaiting pharmacy benefit verification.

## 2021-07-30 LAB — QUANTIFERON-TB GOLD PLUS
Mitogen-NIL: >10 IU/mL
NIL: 0.05 IU/mL
QuantiFERON-TB Gold Plus: NEGATIVE
TB1-NIL: 0 IU/mL
TB2-NIL: 0.01 IU/mL

## 2021-08-01 MED ORDER — RINVOQ 45 MG PO TB24: 45.0000 mg | ORAL_TABLET | Freq: Every day | ORAL | 1 refills | Status: AC

## 2021-08-01 NOTE — Telephone Encounter (Signed)
Per [place] : "[name], Ms. Mask tested negative for TB, she is cleared to start Rinvoq if she has it approved by her insurance. Will need a CBC and CMET in 1 month after starting, and a lipid panel 12 weeks after starting. I would like to see her in the office in 2 months or so for reassessment." Patient scheduled for an appointment on Wed 2-8 at 10:30am. MyChart message sent to patient with update and Dr. Doyne Keel orders re: labs and appointment.

## 2021-08-01 NOTE — Telephone Encounter (Signed)
PA approved for [facility]. Prescription sent to [facility]; medication to be shipped to the pt. It appears it may be as low at $5 with the coupon code.

## 2021-08-04 NOTE — Telephone Encounter (Signed)
Medication shipped to patient on Monday, 12-5.

## 2021-08-08 ENCOUNTER — Other Ambulatory Visit (HOSPITAL_COMMUNITY): Payer: Self-pay

## 2021-08-08 NOTE — Telephone Encounter (Signed)
Sent MyChart message to patient to see if she rec'd and has started on Rinvoq

## 2021-08-09 ENCOUNTER — Other Ambulatory Visit: Payer: Self-pay

## 2021-08-09 DIAGNOSIS — Z79899 Other long term (current) drug therapy: Secondary | ICD-10-CM

## 2021-08-09 DIAGNOSIS — K5100 Ulcerative (chronic) pancolitis without complications: Secondary | ICD-10-CM

## 2021-08-09 DIAGNOSIS — D6800 Von Willebrand disease, unspecified: Secondary | ICD-10-CM

## 2021-08-09 NOTE — Progress Notes (Signed)
Patient to have CBC and CMET one month after starting on Rinvoq

## 2021-08-09 NOTE — Progress Notes (Signed)
Order for lipid panel to be drawn 12 weeks after starting on Rinvoq

## 2021-08-09 NOTE — Telephone Encounter (Signed)
Do you still want to see her on Feb 8th, about a month after she starts the medication, or push that out a little? thanks

## 2021-08-24 ENCOUNTER — Telehealth: Payer: Self-pay

## 2021-08-24 DIAGNOSIS — B379 Candidiasis, unspecified: Secondary | ICD-10-CM

## 2021-08-24 MED ORDER — FLUCONAZOLE 150 MG PO TABS: 150.0000 mg | ORAL_TABLET | Freq: Every day | ORAL | 0 refills | Status: DC

## 2021-08-24 NOTE — Telephone Encounter (Signed)
Patient called office today c/o of vaginal yeast infection.  Reviewed with Dr. Alyson Ingles new order for diflucan 150 mg #2 sent to pharmacy.

## 2021-09-01 ENCOUNTER — Other Ambulatory Visit (HOSPITAL_COMMUNITY): Payer: Self-pay

## 2021-09-01 MED ORDER — RINVOQ 45 MG PO TB24
ORAL_TABLET | ORAL | 0 refills | Status: DC
  Filled 2021-09-01: qty 56, 56d supply, fill #0

## 2021-09-01 MED ORDER — RINVOQ 45 MG PO TB24
ORAL_TABLET | ORAL | 0 refills | Status: DC
  Filled 2021-09-02: qty 28, 28d supply, fill #0
  Filled 2021-09-26: qty 28, 28d supply, fill #1

## 2021-09-02 ENCOUNTER — Other Ambulatory Visit (HOSPITAL_COMMUNITY): Payer: Self-pay

## 2021-09-05 ENCOUNTER — Other Ambulatory Visit (HOSPITAL_COMMUNITY): Payer: Self-pay

## 2021-09-14 ENCOUNTER — Other Ambulatory Visit (HOSPITAL_COMMUNITY): Payer: Self-pay

## 2021-09-26 ENCOUNTER — Other Ambulatory Visit (HOSPITAL_COMMUNITY): Payer: Self-pay

## 2021-09-26 NOTE — Telephone Encounter (Signed)
MyChart message sent to patient.

## 2021-09-26 NOTE — Telephone Encounter (Signed)
-----   Message from Roetta Sessions, [place] sent at 08/09/2021  3:27 PM EST ----- Regarding: cbc and cmet due 30 days after starting on [place] started around 1-5 and have her go to the lab one day next week for CBC and CMET

## 2021-09-28 ENCOUNTER — Other Ambulatory Visit (HOSPITAL_COMMUNITY): Payer: Self-pay

## 2021-10-03 NOTE — Telephone Encounter (Signed)
Called patient.  She confirmed started Rinvoq about 30 days ago. I asked her to go to the lab for cbc and cmet tomorrow since she has an appointment with [place] on 2-8 Wednesday. She confirmed she can go for labs tomorrow and confirmed her appointment on Wednesday.

## 2021-10-04 ENCOUNTER — Other Ambulatory Visit (INDEPENDENT_AMBULATORY_CARE_PROVIDER_SITE_OTHER): Payer: BC Managed Care – PPO

## 2021-10-04 DIAGNOSIS — Z79899 Other long term (current) drug therapy: Secondary | ICD-10-CM | POA: Diagnosis not present

## 2021-10-04 DIAGNOSIS — D6800 Von Willebrand disease, unspecified: Secondary | ICD-10-CM

## 2021-10-04 DIAGNOSIS — K5100 Ulcerative (chronic) pancolitis without complications: Secondary | ICD-10-CM

## 2021-10-04 LAB — CBC WITH DIFFERENTIAL/PLATELET
Basophils Absolute: 0 10*3/uL (ref 0.0–0.1)
Basophils Relative: 0.4 % (ref 0.0–3.0)
Eosinophils Absolute: 0 10*3/uL (ref 0.0–0.7)
Eosinophils Relative: 1.1 % (ref 0.0–5.0)
HCT: 38.3 % (ref 36.0–46.0)
Hemoglobin: 12.8 g/dL (ref 12.0–15.0)
Lymphocytes Relative: 54.6 % — ABNORMAL HIGH (ref 12.0–46.0)
Lymphs Abs: 2.3 10*3/uL (ref 0.7–4.0)
MCHC: 33.4 g/dL (ref 30.0–36.0)
MCV: 85.6 fl (ref 78.0–100.0)
Monocytes Absolute: 0.3 10*3/uL (ref 0.1–1.0)
Monocytes Relative: 6.2 % (ref 3.0–12.0)
Neutro Abs: 1.6 10*3/uL (ref 1.4–7.7)
Neutrophils Relative %: 37.7 % — ABNORMAL LOW (ref 43.0–77.0)
Platelets: 132 10*3/uL — ABNORMAL LOW (ref 150.0–400.0)
RBC: 4.48 Mil/uL (ref 3.87–5.11)
RDW: 13.1 % (ref 11.5–15.5)
WBC: 4.3 10*3/uL (ref 4.0–10.5)

## 2021-10-04 LAB — COMPREHENSIVE METABOLIC PANEL
ALT: 13 U/L (ref 0–35)
AST: 20 U/L (ref 0–37)
Albumin: 4.3 g/dL (ref 3.5–5.2)
Alkaline Phosphatase: 67 U/L (ref 39–117)
BUN: 16 mg/dL (ref 6–23)
CO2: 31 mEq/L (ref 19–32)
Calcium: 9.3 mg/dL (ref 8.4–10.5)
Chloride: 100 mEq/L (ref 96–112)
Creatinine, Ser: 0.71 mg/dL (ref 0.40–1.20)
GFR: 92.3 mL/min (ref >60.00)
Glucose, Bld: 93 mg/dL (ref 70–99)
Potassium: 3.7 mEq/L (ref 3.5–5.1)
Sodium: 134 mEq/L — ABNORMAL LOW (ref 135–145)
Total Bilirubin: 0.5 mg/dL (ref 0.2–1.2)
Total Protein: 7.3 g/dL (ref 6.0–8.3)

## 2021-10-04 LAB — LIPID PANEL
Cholesterol: 242 mg/dL — ABNORMAL HIGH (ref 0–200)
HDL: 73.3 mg/dL (ref >39.00)
LDL Cholesterol: 146 mg/dL — ABNORMAL HIGH (ref 0–99)
NonHDL: 168.66
Total CHOL/HDL Ratio: 3
Triglycerides: 112 mg/dL (ref 0.0–149.0)
VLDL: 22.4 mg/dL (ref 0.0–40.0)

## 2021-10-05 ENCOUNTER — Encounter: Payer: Self-pay | Admitting: Gastroenterology

## 2021-10-05 ENCOUNTER — Ambulatory Visit (INDEPENDENT_AMBULATORY_CARE_PROVIDER_SITE_OTHER): Payer: BC Managed Care – PPO | Admitting: Gastroenterology

## 2021-10-05 VITALS — BP 124/80 | HR 56 | Ht 59.0 in | Wt 140.6 lb

## 2021-10-05 DIAGNOSIS — Z79899 Other long term (current) drug therapy: Secondary | ICD-10-CM | POA: Diagnosis not present

## 2021-10-05 DIAGNOSIS — K51019 Ulcerative (chronic) pancolitis with unspecified complications: Secondary | ICD-10-CM

## 2021-10-05 MED ORDER — RINVOQ 45 MG PO TB24: 45.0000 mg | ORAL_TABLET | Freq: Every day | ORAL | 0 refills | Status: DC

## 2021-10-05 NOTE — Progress Notes (Signed)
HPI :  Colitis History Initially left sided ulcerative colitis, diagnosed > 15 years ago. On Remicade in the past for her colitis, which she thinks she did well for a few years. She had a severe MRSA infection of her ear and ultimately Remicade was held and eventually was stopped given she had done well when it was held. She has never been on methotrexate. She has been on Lialda for a long time, prior to that Asacol. She has Von Willebrands. She bleeds easily but has never had significant bleeding / hemorrhages in the past. She has had DDAVP infusions prior to her last colonoscopy or Stilmate nose spray.  Started on [facility] April 2018 following a few flares that Spring. Failed Entyvio 2019, started Humira and 6MP Feb / March 2020. Failed Humira / 6 MP combination therapy 12/2018. Hepatoxicity to thiopurines - 6MMP levels > 50K. Transitioned to Stelara monotherapy 02/2019. Now PANCOLONIC colitis. Moderately active disease on every 4 weeks dosing of Stelara so it was also stopped summer 2022. Noted to have pancolitis at that time. Started Rinvoq [name] 2023.     SINCE LAST VISIT:   61 year old female here for follow-up visit for her colitis.  Recall that she has failed multiple regimens as outlined above.  Previously on high-dose Stelara (every 4-week dosing) had moderately active colitis on that regimen over the summer and it was stopped around August timeframe.  She has not been on any colitis specific regimen since that time.  We had initially discussed [place] versus Zeposia.  She had an EKG checked and had some resting bradycardia around 60 or so.  After discussion of options she was not too comfortable with proceeding with Zeposia in light of her bradycardia, although this had improved once Toprol was stopped.  Rinvoq had been released to the market while we were sorting this out and after discussion of options she wanted to proceed with that.  He has been on this for the past 4 weeks.  She appears to be  tolerating it pretty well from what she can tell.  Her bowel habits have been regular.  She is having formed stool, in fact is a bit constipated and needing to strain at times.  No blood in her stool or no urgency.  No abdominal pains.  She has some slight fatigue but good appetite, states she thinks this is stimulating her appetite.  She has not had any flares of her symptoms.  She has had some pimples on her face and wonders if this could be related.  She had blood work taken 1 month after she started the drug, platelet count had reduced from normal to about 130s, hemoglobin and white blood cell count normal.  LFTs normal.  She was not supposed to have lipids drawn for 3 months after starting the drug but the lab accidentally drew them sooner even though she was not fasting. She had a borderline fecal calprotectin September, level of 90 prior to starting therapy.    IBD Health Care Maintenance: Annual Flu Vaccine - Date due: 2022 UTD Pneumococcal Vaccine if receiving immunosuppression: - Date PCV 13 - 05/21/2018, PPSV 23 05/01/19 Zoster vaccine i12/18/20, 05/17/19 COVID VACCINE UTD TB testing if on anti-TNF, yearly - negative 06/2021 Vitamin D screening - Date 11/08/20 - level of 49.18 Last Colonoscopy :   Colonoscopy 03/22/21 - The examined portion of the ileum was normal. - Moderately active (Mayo Score 2) ulcerative colitis in the rectum / rectosigmoid colon. Biopsied. - Mild (Mayo  Score 1) ulcerative colitis thoughout elsewhere. Biopsied. - One 6 mm polyp in the distal transverse colon, removed with a hot snare. Resected and retrieved. Clips were placed. - One 2 to 3 mm polyp at the recto-sigmoid colon, removed with a cold biopsy forceps. Resected and retrieved. - The examination was otherwise normal. Unfortunately active inflammation despite Stelara q 4 weeks dosing. Will discuss options with the patient.     FINAL MICROSCOPIC DIAGNOSIS:   A. COLON, RIGHT, BIOPSY:  - Chronic moderately  active colitis.  - No dysplasia or malignancy.   B. COLON, TRANSVERSE, BIOPSY:  - Chronic moderately active colitis.  - No dysplasia or malignancy.   C. COLON, TRANSVERSE, POLYPECTOMY:  - Inflammatory pseudopolyp.  - No dysplasia or malignancy.   D. COLON, LEFT, BIOPSY:  - Chronic mildly active colitis.  - No dysplasia or malignancy.   E. COLON, RECTOSIGMOID, RECTUM, BIOPSY:  - Chronic moderately active colitis.  - No dysplasia or malignancy.   F. RECTUM, POLYPECTOMY:  - Inflammatory pseudopolyp.  - No dysplasia or malignancy.      Colonoscopy 01/15/20 - The examined portion of the ileum was normal. - One 3 to 4 mm polyp in the sigmoid colon, removed with a cold snare. Resected and retrieved. - One 3 mm polyp at the recto-sigmoid colon, removed with a cold biopsy forceps. Resected and retrieved. - Mild (Mayo Score 1) ulcerative colitis of the distal 20cm, otherwise no other inflammatory changes noted. Overall, significantly improved since the last examination. Biopsied. - Biopsies for surveillance were taken from the right colon, left colon, transverse colon, and Rectum.    Colonoscopy 01/24/2019 - severe left sided inflammation, mild in transverse and right colon, normal ileum. Benign inflammatory polyp of the cecum   Colonoscopy 2014 - active rectal inflammation, inflammatory polyp Colonoscopy 09/21/2015 - overall good control of colitis with small mild area of inflammation in left colon, mild chronic colitis Colonoscopy 10/30/2017 - normal ileum, 10-15cm segment of mild inflammation, otherwise normal - mildly active colitis     Fecal calprotectin 90 on 04/29/21      Past Medical History:  Diagnosis Date   Allergy    Anemia    Anxiety    Cellulitis of left breast 09/17/2015   diagnosed at Urgent Care- on ABX   Clostridium difficile diarrhea    Clotting disorder ([place])    Depression    takes Prozac daily   Hearing loss    right ear   History of colon polyps     benign   Hyperlipidemia    Hypertension    takes Metoprolol daily   Hypokalemia    takes Potassium every other day   Inflammatory polyps of colon ([place])    Internal hemorrhoids    Mucosal abnormality of stomach    Osteopenia    Osteoporosis    Personal history of meningioma of the brain    Platelet disorder ([place])    Pneumonia    hx of-about 4+ yrs ago   Seasonal allergies    takes Allegra as needed and Flonase daily   SVT (supraventricular tachycardia) (H[facility])    Tennis elbow    Ulcerative colitis    On Entyvio q 4 week dosing   Urinary urgency    Vitamin B12 deficiency    Von Willebrands disease      Past Surgical History:  Procedure Laterality Date   BAHA REVISION Right 03/15/2016   Procedure: REVISION RIGHT BAHA HEARING IMPLANT ;  Surgeon: Vicie Mutters, MD;  Location: [facility];  Service: ENT;  Laterality: Right;   BIOPSY  01/24/2019   Procedure: BIOPSY;  Surgeon: Yetta Flock, MD;  Location: [facility];  Service: Gastroenterology;;   BIOPSY  01/15/2020   Procedure: BIOPSY;  Surgeon: Yetta Flock, MD;  Location: [facility];  Service: Gastroenterology;;   BIOPSY  03/22/2021   Procedure: BIOPSY;  Surgeon: Yetta Flock, MD;  Location: [facility];  Service: Gastroenterology;;   BRAIN MENINGIOMA EXCISION  05/2007   CARDIAC ELECTROPHYSIOLOGY STUDY AND ABLATION  10/22/09   COLONOSCOPY N/A 07/29/2013   Procedure: COLONOSCOPY;  Surgeon: Lafayette Dragon, MD;  Location: [facility];  Service: Endoscopy;  Laterality: N/A;   COLONOSCOPY WITH PROPOFOL N/A 09/21/2015   Procedure: COLONOSCOPY WITH PROPOFOL;  Surgeon: Manus Gunning, MD;  Location: [facility];  Service: Gastroenterology;  Laterality: N/A;   COLONOSCOPY WITH PROPOFOL N/A 10/30/2017   Procedure: COLONOSCOPY WITH PROPOFOL;  Surgeon: Yetta Flock, MD;  Location: [facility];  Service: Gastroenterology;  Laterality: N/A;   COLONOSCOPY WITH PROPOFOL N/A 01/24/2019   Procedure: COLONOSCOPY WITH  PROPOFOL;  Surgeon: Yetta Flock, MD;  Location: [facility];  Service: Gastroenterology;  Laterality: N/A;   COLONOSCOPY WITH PROPOFOL N/A 01/15/2020   Procedure: COLONOSCOPY WITH PROPOFOL;  Surgeon: Yetta Flock, MD;  Location: [facility];  Service: Gastroenterology;  Laterality: N/A;  needs DDAVP 30-60 minutes prior to procedure.   COLONOSCOPY WITH PROPOFOL N/A 03/22/2021   Procedure: COLONOSCOPY WITH PROPOFOL;  Surgeon: Yetta Flock, MD;  Location: [facility];  Service: Gastroenterology;  Laterality: N/A;   ESOPHAGOGASTRODUODENOSCOPY     HEMOSTASIS CLIP PLACEMENT  03/22/2021   Procedure: HEMOSTASIS CLIP PLACEMENT;  Surgeon: Yetta Flock, MD;  Location: [facility];  Service: Gastroenterology;;   IMPLANTATION BONE ANCHORED HEARING AID Right    POLYPECTOMY  01/24/2019   Procedure: POLYPECTOMY;  Surgeon: Yetta Flock, MD;  Location: [facility];  Service: Gastroenterology;;   POLYPECTOMY  01/15/2020   Procedure: POLYPECTOMY;  Surgeon: Yetta Flock, MD;  Location: [facility];  Service: Gastroenterology;;   POLYPECTOMY  03/22/2021   Procedure: POLYPECTOMY;  Surgeon: Yetta Flock, MD;  Location: [facility];  Service: Gastroenterology;;   WISDOM TOOTH EXTRACTION     Family History  Problem Relation Age of Onset   Heart disease Maternal Grandfather    Heart disease Mother    Stroke Mother    Diabetes Maternal Uncle    Colon cancer Neg Hx    Breast cancer Neg Hx    Esophageal cancer Neg Hx    Rectal cancer Neg Hx    Stomach cancer Neg Hx    Social History   Tobacco Use   Smoking status: Never    Passive exposure: Never   Smokeless tobacco: Never  Vaping Use   Vaping Use: Never used  Substance Use Topics   Alcohol use: No    Alcohol/week: 0.0 standard drinks   Drug use: No   Current Outpatient Medications  Medication Sig Dispense Refill   Cholecalciferol (VITAMIN D3) 125 MCG (5000 UT) CAPS Take 5,000 Units by mouth  daily.     clotrimazole-betamethasone (LOTRISONE) cream Apply 1 application topically daily as needed (Eczema or inflamed in skull).     FLUoxetine (PROZAC) 20 MG capsule Take 20 mg by mouth daily 90 capsule 0   hydrocortisone 2.5 % ointment Apply 1 application topically 2 (two) times daily as needed (for eczema).     NON FORMULARY Take 1 tablet by mouth daily.  Terrazyme (Essential Oil)     OVER THE COUNTER MEDICATION Take 1 capsule by mouth daily. Doterra  Alpha CRS +     OVER THE COUNTER MEDICATION Take 1 tablet by mouth daily. Doterra Dr-prime culture complex     Upadacitinib ER (RINVOQ) 45 MG TB24 Take one tablet by mouth once daily for 8 weeks 56 tablet 0   Current Facility-Administered Medications  Medication Dose Route Frequency Provider Last Rate Last Admin   desmopressin (DDAVP) 23.6 mcg in sodium chloride 0.9 % IV Syringe  0.3 mcg/kg Intravenous Once Santanna Olenik, Carlota Raspberry, MD       Allergies  Allergen Reactions   Atenolol Swelling   Asa [Aspirin]     Platelet disorder    Cephalexin Other (See Comments)    Upset her colitis    Corn Starch Other (See Comments)    Gi-upset  Allergy test   Ibuprofen     Platelet disrder   Mercaptopurine Hives and Itching    Elevated liver levels   Nsaids Other (See Comments)    PLATELET DISORDER    Soy Allergy Other (See Comments)    GI symptoms      Review of Systems: All systems reviewed and negative except where noted in HPI.    Lab Results  Component Value Date   WBC 4.3 10/04/2021   HGB 12.8 10/04/2021   HCT 38.3 10/04/2021   MCV 85.6 10/04/2021   PLT 132.0 (L) 10/04/2021    Lab Results  Component Value Date   CREATININE 0.71 10/04/2021   BUN 16 10/04/2021   NA 134 (L) 10/04/2021   K 3.7 10/04/2021   CL 100 10/04/2021   CO2 31 10/04/2021    Lab Results  Component Value Date   ALT 13 10/04/2021   AST 20 10/04/2021   ALKPHOS 67 10/04/2021   BILITOT 0.5 10/04/2021     Physical Exam: BP 124/80    Pulse  (!) 56    Ht 4\' 11"  (1.499 m)    Wt 140 lb 9.6 oz (63.8 kg)    LMP 11/26/2008    BMI 28.40 kg/m  Constitutional: Pleasant,well-developed, female in no acute distress. Neurological: Alert and oriented to person place and time. Skin: Skin is warm and dry. Some small pimples on forehead and cheek Psychiatric: Normal mood and affect. Behavior is normal.   ASSESSMENT AND PLAN: 61 year old female here for reassessment of the following:  Ulcerative pancolitis High risk medication use  61 year old female with ulcerative colitis who has failed multiple regimens as outlined above.  After discussion of all options including risks and benefits of each, she wanted to proceed with Rinvoq most recently, was started on this in January.  After 4 weeks she is feeling well without any colitis symptoms that are bothering her.  Platelet count has dropped slightly, I do not see this listed as a common reaction from Rinvoq but will recheck in 2 weeks to trend this further.  Otherwise her labs are stable.  Her lipid panel was drawn too soon and she was not fasting, she will need to repeat this when fasting in 2 months.  Otherwise has some mild acne that has developed, also not a common reaction from Rinvoq.  She will use topical face wash and over-the-counter products for now.  She will continue Rinvoq 45 mg daily for another 4 weeks and then reduce to 15 mg/day.  Repeat lipids in 2 months and CBC in 2 to 3 weeks.  I will plan on seeing  her in 3 months and we will plan on doing a colonoscopy again for surveillance this summer.  She agrees  Plan: - continue Rinvoq 45mg  for another 4 weeks then reduce to 15mg  - repeat CBC and LFTs in 2 weeks - Repeat lipids in 2 months when fasting - follow up 3 months - plan for colonoscopy for surveillance this summer  Jolly Mango, MD [facility]

## 2021-10-05 NOTE — Patient Instructions (Addendum)
If you are age 61 or older, your body mass index should be between 23-30. Your Body mass index is 28.4 kg/m. If this is out of the aforementioned range listed, please consider follow up with your Primary Care Provider.  If you are age 12 or younger, your body mass index should be between 19-25. Your Body mass index is 28.4 kg/m. If this is out of the aformentioned range listed, please consider follow up with your Primary Care Provider.   ________________________________________________________  The [facility] providers would like to encourage you to use [facility] to communicate with providers for non-urgent requests or questions.  Due to long hold times on the telephone, sending your provider a message by [facility] may be a faster and more efficient way to get a response.  Please allow 48 business hours for a response.  Please remember that this is for non-urgent requests.  _______________________________________________________  Please go to the lab in 2 weeks for CBC and CMET.  You will be due for Lipid panel in 2 months.  We will remind you when it is time to go to the lab.  Thank you for entrusting me with your care and for choosing [facility], Dr. Kreamer Cellar

## 2021-10-17 ENCOUNTER — Other Ambulatory Visit (INDEPENDENT_AMBULATORY_CARE_PROVIDER_SITE_OTHER): Payer: BC Managed Care – PPO

## 2021-10-17 DIAGNOSIS — Z79899 Other long term (current) drug therapy: Secondary | ICD-10-CM | POA: Diagnosis not present

## 2021-10-17 DIAGNOSIS — K51019 Ulcerative (chronic) pancolitis with unspecified complications: Secondary | ICD-10-CM

## 2021-10-17 LAB — CBC WITH DIFFERENTIAL/PLATELET
Basophils Absolute: 0 10*3/uL (ref 0.0–0.1)
Basophils Relative: 0.2 % (ref 0.0–3.0)
Eosinophils Absolute: 0 10*3/uL (ref 0.0–0.7)
Eosinophils Relative: 1.1 % (ref 0.0–5.0)
HCT: 38 % (ref 36.0–46.0)
Hemoglobin: 12.7 g/dL (ref 12.0–15.0)
Lymphocytes Relative: 56.1 % — ABNORMAL HIGH (ref 12.0–46.0)
Lymphs Abs: 2.4 10*3/uL (ref 0.7–4.0)
MCHC: 33.5 g/dL (ref 30.0–36.0)
MCV: 85.7 fl (ref 78.0–100.0)
Monocytes Absolute: 0.3 10*3/uL (ref 0.1–1.0)
Monocytes Relative: 6.2 % (ref 3.0–12.0)
Neutro Abs: 1.5 10*3/uL (ref 1.4–7.7)
Neutrophils Relative %: 36.4 % — ABNORMAL LOW (ref 43.0–77.0)
Platelets: 174 10*3/uL (ref 150.0–400.0)
RBC: 4.43 Mil/uL (ref 3.87–5.11)
RDW: 13.6 % (ref 11.5–15.5)
WBC: 4.2 10*3/uL (ref 4.0–10.5)

## 2021-10-17 LAB — COMPREHENSIVE METABOLIC PANEL
ALT: 13 U/L (ref 0–35)
AST: 22 U/L (ref 0–37)
Albumin: 4.4 g/dL (ref 3.5–5.2)
Alkaline Phosphatase: 56 U/L (ref 39–117)
BUN: 17 mg/dL (ref 6–23)
CO2: 34 mEq/L — ABNORMAL HIGH (ref 19–32)
Calcium: 9.6 mg/dL (ref 8.4–10.5)
Chloride: 100 mEq/L (ref 96–112)
Creatinine, Ser: 0.76 mg/dL (ref 0.40–1.20)
GFR: 85.04 mL/min (ref >60.00)
Glucose, Bld: 89 mg/dL (ref 70–99)
Potassium: 4.4 mEq/L (ref 3.5–5.1)
Sodium: 135 mEq/L (ref 135–145)
Total Bilirubin: 0.6 mg/dL (ref 0.2–1.2)
Total Protein: 7.4 g/dL (ref 6.0–8.3)

## 2021-10-18 ENCOUNTER — Other Ambulatory Visit: Payer: Self-pay

## 2021-10-18 DIAGNOSIS — D6800 Von Willebrand disease, unspecified: Secondary | ICD-10-CM

## 2021-10-18 DIAGNOSIS — Z79899 Other long term (current) drug therapy: Secondary | ICD-10-CM

## 2021-10-18 DIAGNOSIS — K51019 Ulcerative (chronic) pancolitis with unspecified complications: Secondary | ICD-10-CM

## 2021-10-18 NOTE — Progress Notes (Signed)
Patient needs labs in April. On Rinvoq for Ulcerative colitis.

## 2021-10-21 ENCOUNTER — Other Ambulatory Visit: Payer: Self-pay | Admitting: Gastroenterology

## 2021-10-21 ENCOUNTER — Other Ambulatory Visit (HOSPITAL_COMMUNITY): Payer: Self-pay

## 2021-10-23 ENCOUNTER — Other Ambulatory Visit: Payer: Self-pay

## 2021-10-23 MED ORDER — RINVOQ 15 MG PO TB24: 15.0000 mg | ORAL_TABLET | Freq: Every day | ORAL | 5 refills | Status: DC

## 2021-10-23 NOTE — Progress Notes (Signed)
Patient completed initiation dose of 45 mg qd for 8 weeks, start maintenance dose of 15 mg qd

## 2021-10-24 ENCOUNTER — Other Ambulatory Visit (HOSPITAL_COMMUNITY): Payer: Self-pay

## 2021-10-26 ENCOUNTER — Other Ambulatory Visit (HOSPITAL_COMMUNITY): Payer: Self-pay

## 2021-10-27 ENCOUNTER — Other Ambulatory Visit (HOSPITAL_COMMUNITY): Payer: Self-pay

## 2021-10-28 ENCOUNTER — Other Ambulatory Visit (HOSPITAL_COMMUNITY): Payer: Self-pay

## 2021-10-28 MED ORDER — RINVOQ 15 MG PO TB24
ORAL_TABLET | ORAL | 5 refills | Status: DC
  Filled 2021-10-28: qty 30, 30d supply, fill #0
  Filled 2021-11-22: qty 30, 30d supply, fill #1
  Filled 2021-12-13: qty 30, 30d supply, fill #2
  Filled 2022-01-13: qty 30, 30d supply, fill #3
  Filled 2022-02-16: qty 30, 30d supply, fill #4
  Filled 2022-03-14: qty 30, 30d supply, fill #5

## 2021-11-09 ENCOUNTER — Other Ambulatory Visit: Payer: Self-pay

## 2021-11-09 ENCOUNTER — Ambulatory Visit (HOSPITAL_COMMUNITY)
Admission: RE | Admit: 2021-11-09 | Discharge: 2021-11-09 | Disposition: A | Discharge status: Home / Self Care | Payer: BC Managed Care – PPO | Source: Ambulatory Visit | Attending: Urology | Admitting: Urology

## 2021-11-09 DIAGNOSIS — R3129 Other microscopic hematuria: Secondary | ICD-10-CM | POA: Diagnosis present

## 2021-11-15 ENCOUNTER — Ambulatory Visit: Payer: BC Managed Care – PPO | Admitting: Urology

## 2021-11-16 ENCOUNTER — Ambulatory Visit (INDEPENDENT_AMBULATORY_CARE_PROVIDER_SITE_OTHER): Payer: BC Managed Care – PPO | Admitting: Urology

## 2021-11-16 ENCOUNTER — Encounter: Payer: Self-pay | Admitting: Urology

## 2021-11-16 ENCOUNTER — Other Ambulatory Visit: Payer: Self-pay

## 2021-11-16 VITALS — BP 135/60 | HR 54

## 2021-11-16 DIAGNOSIS — R3129 Other microscopic hematuria: Secondary | ICD-10-CM

## 2021-11-16 DIAGNOSIS — D179 Benign lipomatous neoplasm, unspecified: Secondary | ICD-10-CM

## 2021-11-16 LAB — URINALYSIS, ROUTINE W REFLEX MICROSCOPIC
Bilirubin, UA: NEGATIVE
Glucose, UA: NEGATIVE
Ketones, UA: NEGATIVE
Leukocytes,UA: NEGATIVE
Nitrite, UA: NEGATIVE
Protein,UA: NEGATIVE
Specific Gravity, UA: 1.01 (ref 1.005–1.030)
Urobilinogen, Ur: 0.2 mg/dL (ref 0.2–1.0)
pH, UA: 7 (ref 5.0–7.5)

## 2021-11-16 LAB — MICROSCOPIC EXAMINATION
Bacteria, UA: NONE SEEN
Epithelial Cells (non renal): NONE SEEN /hpf (ref 0–10)
RBC, Urine: NONE SEEN /hpf (ref 0–2)
Renal Epithel, UA: NONE SEEN /hpf
WBC, UA: NONE SEEN /hpf (ref 0–5)

## 2021-11-16 NOTE — Progress Notes (Signed)
11/16/2021 2:18 PM   Lauren [name] 27-Aug-1961 [id number]  Referring provider: Tracey Harries, MD [address] [place],  [place] [address]  Followup microhematuria and renal mass   HPI: [name] is a 60yo here for followup for microhematuria and renal masses. UA today shows no blood. She denies any worsening LUTS. Renal US from 11/12/2021 shows bilateral 5-60mm AMLs. No flank pain. No gross hematuria. No other complaints today.    PMH: Past Medical History:  Diagnosis Date   Allergy    Anemia    Anxiety    Cellulitis of left breast 09/17/2015   diagnosed at Urgent Care- on ABX   Clostridium difficile diarrhea    Clotting disorder (HCC)    Depression    takes Prozac daily   Hearing loss    right ear   History of colon polyps    benign   Hyperlipidemia    Hypertension    takes Metoprolol daily   Hypokalemia    takes Potassium every other day   Inflammatory polyps of colon (HCC)    Internal hemorrhoids    Mucosal abnormality of stomach    Osteopenia    Osteoporosis    Personal history of meningioma of the brain    Platelet disorder (HCC)    Pneumonia    hx of-about 4+ yrs ago   Seasonal allergies    takes Allegra as needed and Flonase daily   SVT (supraventricular tachycardia) (HCC)    Tennis elbow    Ulcerative colitis    On Entyvio q 4 week dosing   Urinary urgency    Vitamin B12 deficiency    Von [place]brands disease     Surgical History: Past Surgical History:  Procedure Laterality Date   BAHA REVISION Right 03/15/2016   Procedure: REVISION RIGHT BAHA HEARING IMPLANT ;  Surgeon: Ermalinda Barrios, MD;  Location: [facility] OR;  Service: ENT;  Laterality: Right;   BIOPSY  01/24/2019   Procedure: BIOPSY;  Surgeon: Benancio Deeds, MD;  Location: [facility];  Service: Gastroenterology;;   BIOPSY  01/15/2020   Procedure: BIOPSY;  Surgeon: Benancio Deeds, MD;  Location: [facility];  Service: Gastroenterology;;   BIOPSY  03/22/2021    Procedure: BIOPSY;  Surgeon: Benancio Deeds, MD;  Location: [facility];  Service: Gastroenterology;;   BRAIN MENINGIOMA EXCISION  05/2007   CARDIAC ELECTROPHYSIOLOGY STUDY AND ABLATION  10/22/09   COLONOSCOPY N/A 07/29/2013   Procedure: COLONOSCOPY;  Surgeon: Hart Carwin, MD;  Location: [facility];  Service: Endoscopy;  Laterality: N/A;   COLONOSCOPY WITH PROPOFOL N/A 09/21/2015   Procedure: COLONOSCOPY WITH PROPOFOL;  Surgeon: Ruffin Frederick, MD;  Location: [facility];  Service: Gastroenterology;  Laterality: N/A;   COLONOSCOPY WITH PROPOFOL N/A 10/30/2017   Procedure: COLONOSCOPY WITH PROPOFOL;  Surgeon: Benancio Deeds, MD;  Location: [facility];  Service: Gastroenterology;  Laterality: N/A;   COLONOSCOPY WITH PROPOFOL N/A 01/24/2019   Procedure: COLONOSCOPY WITH PROPOFOL;  Surgeon: Benancio Deeds, MD;  Location: [facility];  Service: Gastroenterology;  Laterality: N/A;   COLONOSCOPY WITH PROPOFOL N/A 01/15/2020   Procedure: COLONOSCOPY WITH PROPOFOL;  Surgeon: Benancio Deeds, MD;  Location: [facility];  Service: Gastroenterology;  Laterality: N/A;  needs DDAVP 30-60 minutes prior to procedure.   COLONOSCOPY WITH PROPOFOL N/A 03/22/2021   Procedure: COLONOSCOPY WITH PROPOFOL;  Surgeon: Benancio Deeds, MD;  Location: [facility];  Service: Gastroenterology;  Laterality: N/A;   ESOPHAGOGASTRODUODENOSCOPY     HEMOSTASIS  CLIP PLACEMENT  03/22/2021   Procedure: HEMOSTASIS CLIP PLACEMENT;  Surgeon: Benancio Deeds, MD;  Location: [facility];  Service: Gastroenterology;;   IMPLANTATION BONE ANCHORED HEARING AID Right    POLYPECTOMY  01/24/2019   Procedure: POLYPECTOMY;  Surgeon: Benancio Deeds, MD;  Location: [facility];  Service: Gastroenterology;;   POLYPECTOMY  01/15/2020   Procedure: POLYPECTOMY;  Surgeon: Benancio Deeds, MD;  Location: [facility];  Service: Gastroenterology;;   POLYPECTOMY  03/22/2021   Procedure: POLYPECTOMY;   Surgeon: Benancio Deeds, MD;  Location: [facility];  Service: Gastroenterology;;   WISDOM TOOTH EXTRACTION      Home Medications:  Allergies as of 11/16/2021       Reactions   Atenolol Swelling   Nsaids Other (See Comments)   PLATELET DISORDER  NSAIDS contraindicated with bleeding disorder. NSAIDS contraindicated with bleeding disorder.   Sulfamethoxazole Other (See Comments), Rash   "redness"   Asa [aspirin]    Platelet disorder   Cephalexin Other (See Comments)   Upset her colitis   Corn Starch Other (See Comments)   Gi-upset Allergy test   Ibuprofen    Platelet disrder   Mercaptopurine Hives, Itching   Elevated liver levels   Soy Allergy Other (See Comments)   GI symptoms         Medication List        Accurate as of November 16, 2021  2:18 PM. If you have any questions, ask your nurse or doctor.          clotrimazole-betamethasone cream Commonly known as: LOTRISONE Apply 1 application topically daily as needed (Eczema or inflamed in skull).   FLUoxetine 20 MG capsule Commonly known as: PROZAC Take 20 mg by mouth daily   hydrocortisone 2.5 % ointment Apply 1 application topically 2 (two) times daily as needed (for eczema).   metoprolol succinate 50 MG 24 hr tablet Commonly known as: TOPROL-XL Take 50 mg by mouth daily.   NON FORMULARY Take 1 tablet by mouth daily. Terrazyme (Essential Oil)   OVER THE COUNTER MEDICATION Take 1 capsule by mouth daily. Doterra  Alpha CRS +   OVER THE COUNTER MEDICATION Take 1 tablet by mouth daily. Doterra Dr-prime culture complex   Rinvoq 15 MG Tb24 Generic drug: Upadacitinib ER Take 15 mg by mouth daily.   Rinvoq 15 MG Tb24 Generic drug: Upadacitinib ER Take one tablet by mouth once daily (Take one tablet by mouth once daily)   Vitamin D3 125 MCG (5000 UT) Caps Take 5,000 Units by mouth daily.        Allergies:  Allergies  Allergen Reactions   Atenolol Swelling   Nsaids Other (See  Comments)    PLATELET DISORDER  NSAIDS contraindicated with bleeding disorder.  NSAIDS contraindicated with bleeding disorder.   Sulfamethoxazole Other (See Comments) and Rash    "redness"   Asa [Aspirin]     Platelet disorder    Cephalexin Other (See Comments)    Upset her colitis    Corn Starch Other (See Comments)    Gi-upset  Allergy test   Ibuprofen     Platelet disrder   Mercaptopurine Hives and Itching    Elevated liver levels   Soy Allergy Other (See Comments)    GI symptoms     Family History: Family History  Problem Relation Age of Onset   Heart disease Maternal Grandfather    Heart disease Mother    Stroke Mother    Diabetes Maternal Uncle  Colon cancer Neg Hx    Breast cancer Neg Hx    Esophageal cancer Neg Hx    Rectal cancer Neg Hx    Stomach cancer Neg Hx     Social History:  reports that she has never smoked. She has never been exposed to tobacco smoke. She has never used smokeless tobacco. She reports that she does not drink alcohol and does not use drugs.  ROS: All other review of systems were reviewed and are negative except what is noted above in HPI  Physical Exam: BP 135/60   Pulse (!) 54   LMP 11/26/2008   Constitutional:  Alert and oriented, No acute distress. HEENT: [place] AT, moist mucus membranes.  Trachea midline, no masses. Cardiovascular: No clubbing, cyanosis, or edema. Respiratory: Normal respiratory effort, no increased work of breathing. GI: Abdomen is soft, nontender, nondistended, no abdominal masses GU: No CVA tenderness.  Lymph: No cervical or inguinal lymphadenopathy. Skin: No rashes, bruises or suspicious lesions. Neurologic: Grossly intact, no focal deficits, moving all 4 extremities. Psychiatric: Normal mood and affect.  Laboratory Data: Lab Results  Component Value Date   WBC 4.2 10/17/2021   HGB 12.7 10/17/2021   HCT 38.0 10/17/2021   MCV 85.7 10/17/2021   PLT 174.0 10/17/2021    Lab Results  Component  Value Date   CREATININE 0.76 10/17/2021    No results found for: PSA  No results found for: TESTOSTERONE  Lab Results  Component Value Date   HGBA1C 5.7 09/03/2009    Urinalysis    Component Value Date/Time   COLORURINE LT. YELLOW 07/31/2013 0811   APPEARANCEUR Clear 11/22/2020 1530   LABSPEC 1.015 07/31/2013 0811   PHURINE 6.0 07/31/2013 0811   GLUCOSEU Negative 11/22/2020 1530   GLUCOSEU NEGATIVE 07/31/2013 0811   HGBUR Moderate 07/31/2013 0811   BILIRUBINUR Negative 11/22/2020 1530   KETONESUR negative 09/26/2016 1814   KETONESUR NEGATIVE 07/31/2013 0811   PROTEINUR Negative 11/22/2020 1530   PROTEINUR NEGATIVE 06/24/2007 1621   UROBILINOGEN 0.2 09/26/2016 1814   UROBILINOGEN 0.2 07/31/2013 0811   NITRITE Negative 11/22/2020 1530   NITRITE NEGATIVE 07/31/2013 0811   LEUKOCYTESUR 0-5 11/22/2020 1530    Lab Results  Component Value Date   LABMICR See below: 11/22/2020   WBCUA 0-5 11/22/2020   LABEPIT 0-10 11/22/2020   MUCUS neg 12/31/2014   BACTERIA Few 11/22/2020    Pertinent Imaging: Renal US 11/12/2021: Images reviewed and discussed with the patient No results found for this or any previous visit.  No results found for this or any previous visit.  No results found for this or any previous visit.  No results found for this or any previous visit.  Results for orders placed during the hospital encounter of 11/09/21  Ultrasound renal complete  Narrative CLINICAL DATA:  microhematuria  EXAM: RENAL / URINARY TRACT ULTRASOUND COMPLETE  COMPARISON:  December 01, 2020, May 13, 2019  FINDINGS: Right Kidney:  Renal measurements: 11.0 x 3.8 x 5.4 cm = volume: 120 mL. Echogenicity within normal limits. No mass or hydronephrosis visualized.  Left Kidney:  Renal measurements: 10.2 x 5.0 x 4.9 cm = volume: 132 mL. Echogenicity within normal limits. No hydronephrosis visualized. Revisualization of an echogenic mass within the interpolar LEFT kidney  which measures 7 x 7 x 6 mm, similar in comparison to prior. This corresponds to a fat containing mass noted on prior CT from May 13, 2019 and is most consistent with an angiomyolipoma. An additional echogenic mass is noted in  the mid kidney slightly superior which measures 5 x 5 x 4 mm. This corresponds to an additional fat containing mass noted on coronal slice 74 from prior CT. This also likely reflects an angiomyolipoma.  Bladder:  Appears normal for degree of bladder distention.  Other:  None.  IMPRESSION: There are 2 echogenic masses in the LEFT kidney which are most consistent with fat containing masses noted on prior CT from 2020. These are most consistent with benign angiomyolipomas, the largest of which measures 7 mm.   Electronically Signed By: Meda Klinefelter M.D. On: 11/12/2021 10:14  No results found for this or any previous visit.  No results found for this or any previous visit.  No results found for this or any previous visit.   Assessment & Plan:    1. Microscopic hematuria -RTC 1 year with UA  2. Renal AMLs -RTC 1 year with renal US   No follow-ups on file.  Wilkie Aye, MD  [facility] [place]

## 2021-11-16 NOTE — Patient Instructions (Signed)
Renal Mass ?A renal mass is an abnormal growth in the kidney. It may be found while performing an MRI, CT scan, or ultrasound to evaluate other problems of the abdomen. A renal mass that is cancerous (malignant) may grow or spread quickly. Others are not cancerous (benign). ?Renal masses include: ?Tumors. These may be malignant or benign. ?The most common type of kidney cancer in adults is renal cell carcinoma. In children, the most common type of kidney cancer is Wilms tumor. ?The most common benign tumors of the kidney include renal adenomas, oncocytomas, and angiomyolipoma (AML). ?Cysts. These are fluid-filled sacs that form on or in the kidney. ?What are the causes? ?Certain types of cancers, infections, or injuries can cause a renal mass. It is not always known what causes a cyst to develop in or on the kidney. ?What are the signs or symptoms? ?Often, a renal mass does not cause any signs or symptoms; most kidney cysts do not cause symptoms. ?How is this diagnosed? ?Your health care provider may recommend tests to diagnose the cause of your renal mass. These tests may be done if a renal mass is found: ?Physical exam. ?Blood tests. ?Urine tests. ?Imaging tests, such as ultrasound, CT scan, or MRI. ?Biopsy. This is a small sample that is removed from the renal mass and tested in a lab. ?The exact tests and how often they are done will depend on: ?The size and appearance of the renal mass. ?Risk factors or medical conditions that increase your risk for problems. ?Any symptoms associated with the renal mass, or concerns that you have about it. ?Tests and physical exams may be done once, or they may be done regularly for a period of time. Tests and exams that are done regularly will help monitor whether the mass is growing and beginning to cause problems. ?How is this treated? ?Treatment is not always needed for this condition. Your health care provider may recommend careful monitoring and regular tests and exams.  Treatment will depend on the cause of the mass. ?Treatment for a cancerous renal mass may include surgical removal, chemotherapy, radiation, or immunotherapy. ?Most kidney cysts do not need to be treated. ?Follow these instructions at home: ?What you need to do at home will depend on the cause of the mass. Follow the instructions that your health care provider gives to you. In general: ?Take over-the-counter and prescription medicines only as told by your health care provider. ?If you were prescribed an antibiotic medicine, take it as told by your health care provider. Do not stop taking the antibiotic even if you start to feel better. ?Follow any restrictions that are given to you by your health care provider. ?Keep all follow-up visits. This is important. ?You may need to see your health care provider once or twice a year to have CT scans and ultrasounds. These tests will show if your renal mass has changed or grown. ?Contact a health care provider if you: ?Have pain in your side or back (flank pain). ?Have a fever. ?Feel full soon after eating. ?Have pain or swelling in the abdomen. ?Lose weight. ?Get help right away if: ?Your pain gets worse. ?There is blood in your urine. ?You cannot urinate. ?You have chest pain. ?You have trouble breathing. ?These symptoms may represent a serious problem that is an emergency. Do not wait to see if the symptoms will go away. Get medical help right away. Call your local emergency services (911 in the [place] ?Summary ?A renal mass is an abnormal  growth in the kidney. It may be cancerous (malignant) and grow or spread quickly, or it may not be cancerous (benign). Renal masses often do not have any signs or symptoms. ?Renal masses may be found while performing an MRI, CT scan, or ultrasound for other problems of the abdomen. ?Your health care provider may recommend that you have tests to diagnose the cause of your renal mass. These may include a physical exam, blood tests, urine  tests, imaging, or a biopsy. ?Treatment is not always needed for this condition. Careful monitoring may be recommended. ?This information is not intended to replace advice given to you by your health care provider. Make sure you discuss any questions you have with your health care provider. ?Document Revised: 02/09/2020 Document Reviewed: 02/09/2020 ?Elsevier Patient Education ? [place]. ? ?

## 2021-11-17 ENCOUNTER — Other Ambulatory Visit (HOSPITAL_COMMUNITY): Payer: Self-pay

## 2021-11-22 ENCOUNTER — Other Ambulatory Visit (HOSPITAL_COMMUNITY): Payer: Self-pay

## 2021-11-23 ENCOUNTER — Other Ambulatory Visit (HOSPITAL_COMMUNITY): Payer: Self-pay

## 2021-11-25 NOTE — Telephone Encounter (Signed)
Called and spoke to patient. Reminded her to go for fasting labs one day next week. Patient confirmed understanding. ?

## 2021-11-28 ENCOUNTER — Other Ambulatory Visit (INDEPENDENT_AMBULATORY_CARE_PROVIDER_SITE_OTHER): Payer: BC Managed Care – PPO

## 2021-11-28 ENCOUNTER — Encounter: Payer: Self-pay | Admitting: Gastroenterology

## 2021-11-28 DIAGNOSIS — D6800 Von Willebrand disease, unspecified: Secondary | ICD-10-CM

## 2021-11-28 DIAGNOSIS — Z79899 Other long term (current) drug therapy: Secondary | ICD-10-CM | POA: Diagnosis not present

## 2021-11-28 DIAGNOSIS — K51019 Ulcerative (chronic) pancolitis with unspecified complications: Secondary | ICD-10-CM

## 2021-11-28 LAB — HEPATIC FUNCTION PANEL
ALT: 13 U/L (ref 0–35)
AST: 21 U/L (ref 0–37)
Albumin: 4.4 g/dL (ref 3.5–5.2)
Alkaline Phosphatase: 44 U/L (ref 39–117)
Bilirubin, Direct: 0.1 mg/dL (ref 0.0–0.3)
Total Bilirubin: 0.7 mg/dL (ref 0.2–1.2)
Total Protein: 7.1 g/dL (ref 6.0–8.3)

## 2021-11-28 LAB — CBC WITH DIFFERENTIAL/PLATELET
Basophils Absolute: 0 10*3/uL (ref 0.0–0.1)
Basophils Relative: 0.5 % (ref 0.0–3.0)
Eosinophils Absolute: 0 10*3/uL (ref 0.0–0.7)
Eosinophils Relative: 1 % (ref 0.0–5.0)
HCT: 36.5 % (ref 36.0–46.0)
Hemoglobin: 12.4 g/dL (ref 12.0–15.0)
Lymphocytes Relative: 43.6 % (ref 12.0–46.0)
Lymphs Abs: 2 10*3/uL (ref 0.7–4.0)
MCHC: 34 g/dL (ref 30.0–36.0)
MCV: 89.3 fl (ref 78.0–100.0)
Monocytes Absolute: 0.4 10*3/uL (ref 0.1–1.0)
Monocytes Relative: 9.1 % (ref 3.0–12.0)
Neutro Abs: 2 10*3/uL (ref 1.4–7.7)
Neutrophils Relative %: 45.8 % (ref 43.0–77.0)
Platelets: 197 10*3/uL (ref 150.0–400.0)
RBC: 4.09 Mil/uL (ref 3.87–5.11)
RDW: 16.5 % — ABNORMAL HIGH (ref 11.5–15.5)
WBC: 4.5 10*3/uL (ref 4.0–10.5)

## 2021-11-28 LAB — LIPID PANEL
Cholesterol: 240 mg/dL — ABNORMAL HIGH (ref 0–200)
HDL: 69.6 mg/dL (ref >39.00)
LDL Cholesterol: 154 mg/dL — ABNORMAL HIGH (ref 0–99)
NonHDL: 170.37
Total CHOL/HDL Ratio: 3
Triglycerides: 80 mg/dL (ref 0.0–149.0)
VLDL: 16 mg/dL (ref 0.0–40.0)

## 2021-12-13 ENCOUNTER — Other Ambulatory Visit (HOSPITAL_COMMUNITY): Payer: Self-pay

## 2021-12-20 ENCOUNTER — Other Ambulatory Visit (HOSPITAL_COMMUNITY): Payer: Self-pay

## 2022-01-09 ENCOUNTER — Other Ambulatory Visit: Payer: Self-pay

## 2022-01-13 ENCOUNTER — Other Ambulatory Visit (HOSPITAL_COMMUNITY): Payer: Self-pay

## 2022-01-16 ENCOUNTER — Ambulatory Visit (INDEPENDENT_AMBULATORY_CARE_PROVIDER_SITE_OTHER): Payer: BC Managed Care – PPO | Admitting: Gastroenterology

## 2022-01-16 ENCOUNTER — Encounter: Payer: Self-pay | Admitting: Gastroenterology

## 2022-01-16 VITALS — BP 142/110 | HR 53 | Ht 59.0 in | Wt 147.0 lb

## 2022-01-16 DIAGNOSIS — K51019 Ulcerative (chronic) pancolitis with unspecified complications: Secondary | ICD-10-CM

## 2022-01-16 DIAGNOSIS — Z79899 Other long term (current) drug therapy: Secondary | ICD-10-CM | POA: Diagnosis not present

## 2022-01-16 NOTE — Patient Instructions (Addendum)
If you are age 61 or older, your body mass index should be between 23-30. Your Body mass index is 29.69 kg/m. If this is out of the aforementioned range listed, please consider follow up with your Primary Care Provider.  If you are age 50 or younger, your body mass index should be between 19-25. Your Body mass index is 29.69 kg/m. If this is out of the aformentioned range listed, please consider follow up with your Primary Care Provider.   ________________________________________________________  The [facility] providers would like to encourage you to use [facility] to communicate with providers for non-urgent requests or questions.  Due to long hold times on the telephone, sending your provider a message by [facility] may be a faster and more efficient way to get a response.  Please allow 48 business hours for a response.  Please remember that this is for non-urgent requests.  _______________________________________________________  We will add you to the wait list for a colonoscopy procedure at [facility].  You will be due for labs in October. We will remind you when it is time to go.   Thank you for entrusting me with your care and for choosing [facility], Dr. Cresco Cellar

## 2022-01-16 NOTE — Progress Notes (Signed)
HPI :  Colitis History Initially left sided ulcerative colitis, diagnosed > 15 years ago. On Remicade in the past for her colitis, which she thinks she did well for a few years. She had a severe MRSA infection of her ear and ultimately Remicade was held and eventually was stopped given she had done well when it was held. She has never been on methotrexate. She has been on Lialda for a long time, prior to that Asacol. She has Von Willebrands. She bleeds easily but has never had significant bleeding / hemorrhages in the past. She has had DDAVP infusions prior to her last colonoscopy or Stilmate nose spray.  Started on [facility] April 2018 following a few flares that Spring. Failed Entyvio 2019, started Humira and 6MP Feb / March 2020. Failed Humira / 6 MP combination therapy 12/2018. Hepatoxicity to thiopurines - 6MMP levels > 50K. Transitioned to Stelara monotherapy 02/2019. Now PANCOLONIC colitis. Moderately active disease on every 4 weeks dosing of Stelara so it was also stopped summer 2022. Noted to have pancolitis at that time. Started Rinvoq [name] 2023.     SINCE LAST VISIT:   61 year old female here for follow-up visit. Recall that she has failed multiple regimens as outlined above.  Previously on high-dose Stelara (every 4-week dosing) had moderately active colitis on that regimen over last summer and it was stopped around August timeframe.  We had initially discussed switching to [place] versus [employer].  She had an EKG checked and had some resting bradycardia around 60 or so.  After discussion of options she was not too comfortable with proceeding with Zeposia in light of her bradycardia, although this had improved once Toprol was stopped.  Rinvoq had been released to the market while we were sorting this out and after discussion of options she wanted to proceed with that and it was started in January.  She has been doing really well on it since have last seen her.  She has not had any flares of  symptoms that have bothered her.  She typically has formed stool, if anything occasional constipation with some straining.  No blood in her stools.  She is happy with the regimen.  She has had labs and cholesterol checked in April and peers to be tolerating it well.  She has no complaints.  She questions timing of her next colonoscopy and we discussed that for a bit.  Recall she has bleeding disorder.    IBD Health Care Maintenance: Annual Flu Vaccine - Date due: 2022 UTD Pneumococcal Vaccine if receiving immunosuppression: - Date PCV 13 - 05/21/2018, PPSV 23 05/01/19 Zoster vaccine 08/15/19, 05/17/19 COVID VACCINE UTD TB testing if on anti-TNF, yearly - negative 06/2021 Vitamin D screening - Date 11/08/20 - level of 49.18 Last Colonoscopy :   Colonoscopy 03/22/21 - The examined portion of the ileum was normal. - Moderately active (Mayo Score 2) ulcerative colitis in the rectum / rectosigmoid colon. Biopsied. - Mild (Mayo Score 1) ulcerative colitis thoughout elsewhere. Biopsied. - One 6 mm polyp in the distal transverse colon, removed with a hot snare. Resected and retrieved. Clips were placed. - One 2 to 3 mm polyp at the recto-sigmoid colon, removed with a cold biopsy forceps. Resected and retrieved. - The examination was otherwise normal. Unfortunately active inflammation despite Stelara q 4 weeks dosing. Will discuss options with the patient.     FINAL MICROSCOPIC DIAGNOSIS:   A. COLON, RIGHT, BIOPSY:  - Chronic moderately active colitis.  - No dysplasia or malignancy.  B. COLON, TRANSVERSE, BIOPSY:  - Chronic moderately active colitis.  - No dysplasia or malignancy.   C. COLON, TRANSVERSE, POLYPECTOMY:  - Inflammatory pseudopolyp.  - No dysplasia or malignancy.   D. COLON, LEFT, BIOPSY:  - Chronic mildly active colitis.  - No dysplasia or malignancy.   E. COLON, RECTOSIGMOID, RECTUM, BIOPSY:  - Chronic moderately active colitis.  - No dysplasia or malignancy.   F.  RECTUM, POLYPECTOMY:  - Inflammatory pseudopolyp.  - No dysplasia or malignancy.      Colonoscopy 01/15/20 - The examined portion of the ileum was normal. - One 3 to 4 mm polyp in the sigmoid colon, removed with a cold snare. Resected and retrieved. - One 3 mm polyp at the recto-sigmoid colon, removed with a cold biopsy forceps. Resected and retrieved. - Mild (Mayo Score 1) ulcerative colitis of the distal 20cm, otherwise no other inflammatory changes noted. Overall, significantly improved since the last examination. Biopsied. - Biopsies for surveillance were taken from the right colon, left colon, transverse colon, and Rectum.    Colonoscopy 01/24/2019 - severe left sided inflammation, mild in transverse and right colon, normal ileum. Benign inflammatory polyp of the cecum   Colonoscopy 2014 - active rectal inflammation, inflammatory polyp Colonoscopy 09/21/2015 - overall good control of colitis with small mild area of inflammation in left colon, mild chronic colitis Colonoscopy 10/30/2017 - normal ileum, 10-15cm segment of mild inflammation, otherwise normal - mildly active colitis     Fecal calprotectin 90 on 04/29/21     Past Medical History:  Diagnosis Date   Allergy    Anemia    Anxiety    Cellulitis of left breast 09/17/2015   diagnosed at Urgent Care- on ABX   Clostridium difficile diarrhea    Clotting disorder ([place])    Depression    takes Prozac daily   Hearing loss    right ear   History of colon polyps    benign   Hyperlipidemia    Hypertension    takes Metoprolol daily   Hypokalemia    takes Potassium every other day   Inflammatory polyps of colon ([place])    Internal hemorrhoids    Mucosal abnormality of stomach    Osteopenia    Osteoporosis    Personal history of meningioma of the brain    Platelet disorder ([place])    Pneumonia    hx of-about 4+ yrs ago   Seasonal allergies    takes Allegra as needed and Flonase daily   SVT (supraventricular tachycardia)  (HCC)    Tennis elbow    Ulcerative colitis    On Entyvio q 4 week dosing   Urinary urgency    Vitamin B12 deficiency    Von Willebrands disease ([place])      Past Surgical History:  Procedure Laterality Date   BAHA REVISION Right 03/15/2016   Procedure: REVISION RIGHT BAHA HEARING IMPLANT ;  Surgeon: Vicie Mutters, MD;  Location: [place];  Service: ENT;  Laterality: Right;   BIOPSY  01/24/2019   Procedure: BIOPSY;  Surgeon: Yetta Flock, MD;  Location[facility];  Service: Gastroenterology;;   BIOPSY  01/15/2020   Procedure: BIOPSY;  Surgeon: Yetta Flock, MD;  [facility];  Service: Gastroenterology;;   BIOPSY  03/22/2021   Procedure: BIOPSY;  Surgeon: Yetta Flock, MD;  [facility];  Service: Gastroenterology;;   BRAIN MENINGIOMA EXCISION  05/2007   CARDIAC ELECTROPHYSIOLOGY STUDY AND ABLATION  10/22/09   COLONOSCOPY N/A 07/29/2013  Procedure: COLONOSCOPY;  Surgeon: Lafayette Dragon, MD;  Location[facility];  Service: Endoscopy;  Laterality: N/A;   COLONOSCOPY WITH PROPOFOL N/A 09/21/2015   Procedure: COLONOSCOPY WITH PROPOFOL;  Surgeon: Manus Gunning, MD;  [facility];  Service: Gastroenterology;  Laterality: N/A;   COLONOSCOPY WITH PROPOFOL N/A 10/30/2017   Procedure: COLONOSCOPY WITH PROPOFOL;  Surgeon: Yetta Flock, MD;  [facility];  Service: Gastroenterology;  Laterality: N/A;   COLONOSCOPY WITH PROPOFOL N/A 01/24/2019   Procedure: COLONOSCOPY WITH PROPOFOL;  Surgeon: Yetta Flock, MD;  [facility];  Service: Gastroenterology;  Laterality: N/A;   COLONOSCOPY WITH PROPOFOL N/A 01/15/2020   Procedure: COLONOSCOPY WITH PROPOFOL;  Surgeon: Yetta Flock, MD;  [facility];  Service: Gastroenterology;  Laterality: N/A;  needs DDAVP 30-60 minutes prior to procedure.   COLONOSCOPY WITH PROPOFOL N/A 03/22/2021   Procedure: COLONOSCOPY WITH PROPOFOL;  Surgeon: Yetta Flock, MD;  [facility];  Service: Gastroenterology;  Laterality: N/A;   ESOPHAGOGASTRODUODENOSCOPY     HEMOSTASIS CLIP PLACEMENT  03/22/2021   Procedure: HEMOSTASIS CLIP PLACEMENT;  Surgeon: Yetta Flock, MD;  [facility];  Service: Gastroenterology;;   IMPLANTATION BONE ANCHORED HEARING AID Right    POLYPECTOMY  01/24/2019   Procedure: POLYPECTOMY;  Surgeon: Yetta Flock, MD;  [facility];  Service: Gastroenterology;;   POLYPECTOMY  01/15/2020   Procedure: POLYPECTOMY;  Surgeon: Yetta Flock, MD;  [facility];  Service: Gastroenterology;;   POLYPECTOMY  03/22/2021   Procedure: POLYPECTOMY;  Surgeon: Yetta Flock, MD;  [facility];  Service: Gastroenterology;;   WISDOM TOOTH EXTRACTION     Family History  Problem Relation Age of Onset   Heart disease Maternal Grandfather    Heart disease Mother    Stroke Mother    Diabetes Maternal Uncle    Colon cancer Neg Hx    Breast cancer Neg Hx    Esophageal cancer Neg Hx    Rectal cancer Neg Hx    Stomach cancer Neg Hx    Social History   Tobacco Use   Smoking status: Never    Passive exposure: Never   Smokeless tobacco: Never  Vaping Use   Vaping Use: Never used  Substance Use Topics   Alcohol use: No    Alcohol/week: 0.0 standard drinks   Drug use: No   Current Outpatient Medications  Medication Sig Dispense Refill   Cholecalciferol (VITAMIN D3) 125 MCG (5000 UT) CAPS Take 5,000 Units by mouth daily.     clotrimazole-betamethasone (LOTRISONE) cream Apply 1 application topically daily as needed (Eczema or inflamed in skull).     FLUoxetine (PROZAC) 20 MG capsule Take 20 mg by mouth daily 90 capsule 0   hydrocortisone 2.5 % ointment Apply 1 application topically 2 (two) times daily as needed (for eczema).     metoprolol succinate (TOPROL-XL) 50 MG 24 hr tablet Take 50 mg by mouth daily.     NON FORMULARY Take 1 tablet by mouth daily. Terrazyme  (Essential Oil)     OVER THE COUNTER MEDICATION Take 1 capsule by mouth daily. Doterra  Alpha CRS +     OVER THE COUNTER MEDICATION Take 1 tablet by mouth daily. Doterra Dr-prime culture complex     Upadacitinib ER (RINVOQ) 15 MG TB24 Take one tablet by mouth once daily 30 tablet 5   Current Facility-Administered Medications  Medication Dose Route Frequency Provider Last Rate Last Admin   desmopressin (DDAVP) 23.6  mcg in sodium chloride 0.9 % IV Syringe  0.3 mcg/kg Intravenous Once Ramzey Petrovic, Carlota Raspberry, MD       Allergies  Allergen Reactions   Atenolol Swelling   Nsaids Other (See Comments)    PLATELET DISORDER  NSAIDS contraindicated with bleeding disorder.  NSAIDS contraindicated with bleeding disorder.   Sulfamethoxazole Other (See Comments) and Rash    "redness"   Asa [Aspirin]     Platelet disorder    Cephalexin Other (See Comments)    Upset her colitis    Corn Starch Other (See Comments)    Gi-upset  Allergy test   Ibuprofen     Platelet disrder   Mercaptopurine Hives and Itching    Elevated liver levels   Soy Allergy Other (See Comments)    GI symptoms      Review of Systems: All systems reviewed and negative except where noted in HPI.   Lab Results  Component Value Date   WBC 4.5 11/28/2021   HGB 12.4 11/28/2021   HCT 36.5 11/28/2021   MCV 89.3 11/28/2021   PLT 197.0 11/28/2021    Lab Results  Component Value Date   CREATININE 0.76 10/17/2021   BUN 17 10/17/2021   NA 135 10/17/2021   K 4.4 10/17/2021   CL 100 10/17/2021   CO2 34 (H) 10/17/2021    Lab Results  Component Value Date   ALT 13 11/28/2021   AST 21 11/28/2021   ALKPHOS 44 11/28/2021   BILITOT 0.7 11/28/2021     Physical Exam: BP (!) 142/110   Pulse (!) 53   Ht 4' 11"  (1.499 m)   Wt 147 lb (66.7 kg)   LMP 11/26/2008   BMI 29.69 kg/m  Constitutional: Pleasant,well-developed, female in no acute distress. Neurological: Alert and oriented to person place and  time. Psychiatric: Normal mood and affect. Behavior is normal.   ASSESSMENT AND PLAN: 61 year old female here for reassessment following:  Ulcerative pancolitis High risk medication use  Now on Rinvoq since January of this year after failing multiple regimens as outlined above.  She is doing really well without any symptoms currently, hoping this works well for her long-term.  Her labs are stable, we will plan on repeating these again in October.  We discussed timing of her next surveillance colonoscopy.  Given she changed regimens in her longstanding disease, we will plan surveillance colonoscopy later this summer/early fall, as her case has been done in the hospital we have limited access to hospital outpatient cases at this time.  We will need to coordinate with hematology on her DDAVP regimen pre-procedure etc. given her history of von Willebrand's and bleeding risk.  I discussed risk benefits of colonoscopy and anesthesia with her and she wants to proceed going months.  All questions answered she will contact me in the interim with any questions.  Vaccines up-to-date.  Jolly Mango, MD [facility]

## 2022-01-19 ENCOUNTER — Other Ambulatory Visit (HOSPITAL_COMMUNITY): Payer: Self-pay

## 2022-02-14 ENCOUNTER — Other Ambulatory Visit (HOSPITAL_COMMUNITY): Payer: Self-pay

## 2022-02-16 ENCOUNTER — Other Ambulatory Visit (HOSPITAL_COMMUNITY): Payer: Self-pay

## 2022-02-20 ENCOUNTER — Other Ambulatory Visit (HOSPITAL_COMMUNITY): Payer: Self-pay

## 2022-03-13 ENCOUNTER — Telehealth: Payer: Self-pay

## 2022-03-13 ENCOUNTER — Other Ambulatory Visit: Payer: Self-pay

## 2022-03-13 DIAGNOSIS — K51019 Ulcerative (chronic) pancolitis with unspecified complications: Secondary | ICD-10-CM

## 2022-03-13 DIAGNOSIS — D6800 Von Willebrand disease, unspecified: Secondary | ICD-10-CM

## 2022-03-13 DIAGNOSIS — Z79899 Other long term (current) drug therapy: Secondary | ICD-10-CM

## 2022-03-13 NOTE — Progress Notes (Signed)
Called [facility] and spoke to [place]. Patient scheduled for colonoscopy at [facility] on 9-7,  2;00 pm.  Advised that patient has von Willibrand and will need administration of DDAVP prior to procedure.  Lattie Haw advised that I can call the endo unit directly once I have the recommendations for [facility] and they will enter the orders to make sure all is in order

## 2022-03-13 NOTE — Telephone Encounter (Signed)
Called Dr. Danton Clap De-Ling Ma's office at [facility] Hematology  [phone] For direction regarding patient's upcoming colonoscopy procedure on 05-04-22 at [facility] in light of her von Willebrand disease (clotting disorder). Advised to fax a request to Dr. Lynann Beaver office at [phone]. Letter faxed to [phone] And [phone]

## 2022-03-14 ENCOUNTER — Other Ambulatory Visit (HOSPITAL_COMMUNITY): Payer: Self-pay

## 2022-03-17 NOTE — Telephone Encounter (Signed)
Received recommendations from [facility]ematology - Dr. Lynann Beaver office.  Called and gave orders to Endoscopy Unit at [facility] [phone]. Spoke to [place]. Faxed order to [place] at [phone] as well. Confirmation rec'd . Lattie Haw advised that patient will need to arrive 3 hours early.  Will need to send tranexamic acid Lysteda 650 mg tablets:  2 tablets (1300 mg) by mouth 3 times a day for 5 days after colonoscopy IF biopsies are taken. To be sent [facility]. Patient will need to pick up and bring on day of procedure. Reminder placed to send script 2 weeks before procedure.

## 2022-03-20 ENCOUNTER — Other Ambulatory Visit (HOSPITAL_COMMUNITY): Payer: Self-pay

## 2022-03-20 ENCOUNTER — Encounter: Payer: Self-pay | Admitting: Gastroenterology

## 2022-03-29 ENCOUNTER — Other Ambulatory Visit: Payer: Self-pay

## 2022-03-29 DIAGNOSIS — Z79899 Other long term (current) drug therapy: Secondary | ICD-10-CM

## 2022-03-29 DIAGNOSIS — K51019 Ulcerative (chronic) pancolitis with unspecified complications: Secondary | ICD-10-CM

## 2022-03-29 DIAGNOSIS — E871 Hypo-osmolality and hyponatremia: Secondary | ICD-10-CM

## 2022-03-29 DIAGNOSIS — D6800 Von Willebrand disease, unspecified: Secondary | ICD-10-CM

## 2022-03-29 MED ORDER — TRANEXAMIC ACID 650 MG PO TABS: 1300.0000 mg | ORAL_TABLET | Freq: Three times a day (TID) | ORAL | 0 refills | Status: AC

## 2022-03-29 MED ORDER — SUTAB 1479-225-188 MG PO TABS: 1.0000 | ORAL_TABLET | ORAL | 0 refills | Status: DC

## 2022-03-29 NOTE — Progress Notes (Signed)
Instructions for 05-04-22 colonoscopy at [facility].  Script for Lysteda tablets sent, sutab sent

## 2022-04-07 ENCOUNTER — Encounter: Payer: Self-pay | Admitting: Gastroenterology

## 2022-04-07 DIAGNOSIS — K51019 Ulcerative (chronic) pancolitis with unspecified complications: Secondary | ICD-10-CM

## 2022-04-07 DIAGNOSIS — Z79899 Other long term (current) drug therapy: Secondary | ICD-10-CM

## 2022-04-07 DIAGNOSIS — D6800 Von Willebrand disease, unspecified: Secondary | ICD-10-CM

## 2022-04-07 NOTE — Telephone Encounter (Signed)
Lab orders in epic. Pt notified via [place].

## 2022-04-11 ENCOUNTER — Other Ambulatory Visit (HOSPITAL_COMMUNITY): Payer: Self-pay

## 2022-04-11 ENCOUNTER — Other Ambulatory Visit: Payer: Self-pay | Admitting: Gastroenterology

## 2022-04-11 MED ORDER — RINVOQ 15 MG PO TB24
ORAL_TABLET | ORAL | 5 refills | Status: DC
  Filled 2022-04-20: qty 30, 30d supply, fill #0
  Filled 2022-05-12: qty 30, 30d supply, fill #1
  Filled 2022-06-14: qty 30, 30d supply, fill #2
  Filled 2022-07-12: qty 30, 30d supply, fill #3
  Filled 2022-08-10 (×2): qty 30, 30d supply, fill #4
  Filled 2022-09-15 – 2022-09-26 (×6): qty 30, 30d supply, fill #5

## 2022-04-12 ENCOUNTER — Other Ambulatory Visit (INDEPENDENT_AMBULATORY_CARE_PROVIDER_SITE_OTHER): Payer: BC Managed Care – PPO

## 2022-04-12 DIAGNOSIS — D6800 Von Willebrand disease, unspecified: Secondary | ICD-10-CM

## 2022-04-12 DIAGNOSIS — K51019 Ulcerative (chronic) pancolitis with unspecified complications: Secondary | ICD-10-CM

## 2022-04-12 DIAGNOSIS — Z79899 Other long term (current) drug therapy: Secondary | ICD-10-CM | POA: Diagnosis not present

## 2022-04-12 LAB — CBC WITH DIFFERENTIAL/PLATELET
Basophils Absolute: 0 10*3/uL (ref 0.0–0.1)
Basophils Relative: 0.6 % (ref 0.0–3.0)
Eosinophils Absolute: 0 10*3/uL (ref 0.0–0.7)
Eosinophils Relative: 0.9 % (ref 0.0–5.0)
HCT: 38.5 % (ref 36.0–46.0)
Hemoglobin: 12.9 g/dL (ref 12.0–15.0)
Lymphocytes Relative: 42.6 % (ref 12.0–46.0)
Lymphs Abs: 2.3 10*3/uL (ref 0.7–4.0)
MCHC: 33.4 g/dL (ref 30.0–36.0)
MCV: 90.9 fl (ref 78.0–100.0)
Monocytes Absolute: 0.4 10*3/uL (ref 0.1–1.0)
Monocytes Relative: 7.1 % (ref 3.0–12.0)
Neutro Abs: 2.6 10*3/uL (ref 1.4–7.7)
Neutrophils Relative %: 48.8 % (ref 43.0–77.0)
Platelets: 218 10*3/uL (ref 150.0–400.0)
RBC: 4.23 Mil/uL (ref 3.87–5.11)
RDW: 14 % (ref 11.5–15.5)
WBC: 5.4 10*3/uL (ref 4.0–10.5)

## 2022-04-12 LAB — COMPREHENSIVE METABOLIC PANEL
ALT: 11 U/L (ref 0–35)
AST: 22 U/L (ref 0–37)
Albumin: 4.4 g/dL (ref 3.5–5.2)
Alkaline Phosphatase: 45 U/L (ref 39–117)
BUN: 16 mg/dL (ref 6–23)
CO2: 31 mEq/L (ref 19–32)
Calcium: 9.7 mg/dL (ref 8.4–10.5)
Chloride: 98 mEq/L (ref 96–112)
Creatinine, Ser: 0.86 mg/dL (ref 0.40–1.20)
GFR: 73.07 mL/min (ref >60.00)
Glucose, Bld: 89 mg/dL (ref 70–99)
Potassium: 5 mEq/L (ref 3.5–5.1)
Sodium: 135 mEq/L (ref 135–145)
Total Bilirubin: 0.6 mg/dL (ref 0.2–1.2)
Total Protein: 7.3 g/dL (ref 6.0–8.3)

## 2022-04-19 ENCOUNTER — Telehealth: Payer: Self-pay

## 2022-04-19 MED ORDER — TRANEXAMIC ACID 650 MG PO TABS: 1300.0000 mg | ORAL_TABLET | Freq: Three times a day (TID) | ORAL | 0 refills | Status: AC

## 2022-04-19 NOTE — Telephone Encounter (Signed)
Lysteda prescription sent to [facility] pharmacy on file.  MyChart message sent to patient with information.

## 2022-04-19 NOTE — Telephone Encounter (Addendum)
-----   Message from Roetta Sessions, [place] sent at 03/17/2022 10:17 AM EDT ----- Regarding: send tranexamic acid tablets to [facility] Send script for tranexamic acid (LYSTEDA) 650 mg tablets: Take 2 tablets (1300 mg) by mouth 3 times a day for 5 days after colonoscopy IF biopsies are taken. dispense #30.  To be sent to [facility]. [name] will need to pick up and bring on day of procedure which is scheduled for 9-7.  Notify patient   Lauren [name] - if I am "out", would you please ensure this is taken care of? Thank you!!! [name]

## 2022-04-20 ENCOUNTER — Other Ambulatory Visit (HOSPITAL_COMMUNITY): Payer: Self-pay

## 2022-04-21 NOTE — Telephone Encounter (Signed)
Called and spoke with patient. She states that she has her Lysteda prescription and she knows to bring it with her for her upcoming procedure on 05/04/22. Pt is aware that I also sent this information to her MyChart. Pt verbalized understanding and had no concerns at the end of the call.

## 2022-04-26 ENCOUNTER — Encounter (HOSPITAL_COMMUNITY): Payer: Self-pay | Admitting: Gastroenterology

## 2022-05-04 ENCOUNTER — Other Ambulatory Visit: Payer: Self-pay

## 2022-05-04 ENCOUNTER — Ambulatory Visit (HOSPITAL_COMMUNITY): Payer: BC Managed Care – PPO | Admitting: Certified Registered Nurse Anesthetist

## 2022-05-04 ENCOUNTER — Encounter (HOSPITAL_COMMUNITY)
Admission: RE | Disposition: A | Discharge status: Home / Self Care | Payer: Self-pay | Source: Home / Self Care | Attending: Gastroenterology

## 2022-05-04 ENCOUNTER — Encounter (HOSPITAL_COMMUNITY): Payer: Self-pay | Admitting: Gastroenterology

## 2022-05-04 ENCOUNTER — Ambulatory Visit (HOSPITAL_COMMUNITY)
Admission: RE | Admit: 2022-05-04 | Discharge: 2022-05-04 | Disposition: A | Discharge status: Home / Self Care | Payer: BC Managed Care – PPO | Attending: Gastroenterology | Admitting: Gastroenterology

## 2022-05-04 DIAGNOSIS — K5100 Ulcerative (chronic) pancolitis without complications: Secondary | ICD-10-CM | POA: Diagnosis present

## 2022-05-04 DIAGNOSIS — D6800 Von Willebrand disease, unspecified: Secondary | ICD-10-CM | POA: Diagnosis not present

## 2022-05-04 DIAGNOSIS — K648 Other hemorrhoids: Secondary | ICD-10-CM | POA: Diagnosis not present

## 2022-05-04 DIAGNOSIS — Z79899 Other long term (current) drug therapy: Secondary | ICD-10-CM | POA: Diagnosis not present

## 2022-05-04 DIAGNOSIS — F419 Anxiety disorder, unspecified: Secondary | ICD-10-CM | POA: Insufficient documentation

## 2022-05-04 DIAGNOSIS — Z8249 Family history of ischemic heart disease and other diseases of the circulatory system: Secondary | ICD-10-CM | POA: Insufficient documentation

## 2022-05-04 DIAGNOSIS — Z8711 Personal history of peptic ulcer disease: Secondary | ICD-10-CM | POA: Diagnosis not present

## 2022-05-04 DIAGNOSIS — F32A Depression, unspecified: Secondary | ICD-10-CM | POA: Insufficient documentation

## 2022-05-04 DIAGNOSIS — M1990 Unspecified osteoarthritis, unspecified site: Secondary | ICD-10-CM | POA: Insufficient documentation

## 2022-05-04 DIAGNOSIS — I10 Essential (primary) hypertension: Secondary | ICD-10-CM | POA: Insufficient documentation

## 2022-05-04 DIAGNOSIS — Z79622 Long term (current) use of janus kinase inhibitor: Secondary | ICD-10-CM | POA: Diagnosis not present

## 2022-05-04 DIAGNOSIS — K51019 Ulcerative (chronic) pancolitis with unspecified complications: Secondary | ICD-10-CM

## 2022-05-04 HISTORY — PX: BIOPSY: SHX5522

## 2022-05-04 HISTORY — PX: COLONOSCOPY WITH PROPOFOL: SHX5780

## 2022-05-04 SURGERY — COLONOSCOPY WITH PROPOFOL
Anesthesia: Monitor Anesthesia Care

## 2022-05-04 MED ORDER — SODIUM CHLORIDE 0.9 % IV SOLN
20.0000 ug | INTRAVENOUS | Status: AC
  Administered 2022-05-04: 20 ug via INTRAVENOUS
  Filled 2022-05-04: qty 5

## 2022-05-04 MED ORDER — PROPOFOL 10 MG/ML IV BOLUS
INTRAVENOUS | Status: DC | PRN
  Administered 2022-05-04: 20 mg via INTRAVENOUS

## 2022-05-04 MED ORDER — PROPOFOL 500 MG/50ML IV EMUL
INTRAVENOUS | Status: DC | PRN
  Administered 2022-05-04: 160 ug/kg/min via INTRAVENOUS

## 2022-05-04 MED ORDER — PHENYLEPHRINE 80 MCG/ML (10ML) SYRINGE FOR IV PUSH (FOR BLOOD PRESSURE SUPPORT)
PREFILLED_SYRINGE | INTRAVENOUS | Status: DC | PRN
  Administered 2022-05-04 (×4): 80 ug via INTRAVENOUS

## 2022-05-04 MED ORDER — SODIUM CHLORIDE 0.9 % IV SOLN: INTRAVENOUS | Status: DC

## 2022-05-04 SURGICAL SUPPLY — 22 items

## 2022-05-04 NOTE — Anesthesia Postprocedure Evaluation (Signed)
Anesthesia Post Note  Patient: Lauren [name]  Procedure(s) Performed: COLONOSCOPY WITH PROPOFOL BIOPSY     Patient location during evaluation: PACU Anesthesia Type: MAC Level of consciousness: awake and alert Pain management: pain level controlled Vital Signs Assessment: post-procedure vital signs reviewed and stable Respiratory status: spontaneous breathing, nonlabored ventilation, respiratory function stable and patient connected to nasal cannula oxygen Cardiovascular status: stable and blood pressure returned to baseline Postop Assessment: no apparent nausea or vomiting Anesthetic complications: no   No notable events documented.  Last Vitals:  Vitals:   05/04/22 1440 05/04/22 1450  BP: (!) 109/47 121/60  Pulse: 60 62  Resp: (!) 23 17  Temp:    SpO2: 100% 98%    Last Pain:  Vitals:   05/04/22 1450  TempSrc:   PainSc: 0-No pain                 Sourish Allender

## 2022-05-04 NOTE — Transfer of Care (Signed)
Immediate Anesthesia Transfer of Care Note  Patient: Lauren [name]  Procedure(s) Performed: COLONOSCOPY WITH PROPOFOL BIOPSY  Patient Location: PACU and Endoscopy Unit  Anesthesia Type:MAC  Level of Consciousness: awake, alert , oriented and patient cooperative  Airway & Oxygen Therapy: Patient Spontanous Breathing  Post-op Assessment: Report given to RN and Post -op Vital signs reviewed and stable  Post vital signs: Reviewed and stable  Last Vitals:  Vitals Value Taken Time  BP 94/39 05/04/22 1428  Temp    Pulse 62 05/04/22 1429  Resp 13 05/04/22 1429  SpO2 96 % 05/04/22 1429  Vitals shown include unvalidated device data.  Last Pain:  Vitals:   05/04/22 1248  TempSrc: Temporal  PainSc: 0-No pain         Complications: No notable events documented.

## 2022-05-04 NOTE — Anesthesia Procedure Notes (Signed)
Procedure Name: MAC Date/Time: 05/04/2022 1:59 PM  Performed by: West Pugh, CRNAPre-anesthesia Checklist: Patient identified, Emergency Drugs available, Suction available, Patient being monitored and Timeout performed Patient Re-evaluated:Patient Re-evaluated prior to induction Oxygen Delivery Method: Simple face mask Preoxygenation: Pre-oxygenation with 100% oxygen Placement Confirmation: positive ETCO2 Dental Injury: Teeth and Oropharynx as per pre-operative assessment

## 2022-05-04 NOTE — Anesthesia Preprocedure Evaluation (Signed)
Anesthesia Evaluation  Patient identified by MRN, date of birth, ID band Patient awake    Reviewed: Allergy & Precautions, NPO status , Patient's Chart, lab work & pertinent test results, reviewed documented beta blocker date and time   History of Anesthesia Complications Negative for: history of anesthetic complications  Airway Mallampati: I  TM Distance: >3 FB Neck ROM: Full    Dental no notable dental hx.    Pulmonary neg pulmonary ROS,    Pulmonary exam normal        Cardiovascular hypertension, Pt. on medications and Pt. on home beta blockers Normal cardiovascular exam     Neuro/Psych PSYCHIATRIC DISORDERS Anxiety Depression    GI/Hepatic Neg liver ROS, PUD,   Endo/Other  negative endocrine ROS  Renal/GU negative Renal ROS  negative genitourinary   Musculoskeletal  (+) Arthritis , Osteoarthritis,    Abdominal   Peds  Hematology  (+) Blood dyscrasia, anemia , VWD   Anesthesia Other Findings   Reproductive/Obstetrics                             Anesthesia Physical  Anesthesia Plan  ASA: 3  Anesthesia Plan: MAC   Post-op Pain Management:    Induction:   PONV Risk Score and Plan: Treatment may vary due to age or medical condition and Propofol infusion  Airway Management Planned: Natural Airway, Nasal Cannula and Simple Face Mask  Additional Equipment: None  Intra-op Plan:   Post-operative Plan:   Informed Consent: I have reviewed the patients History and Physical, chart, labs and discussed the procedure including the risks, benefits and alternatives for the proposed anesthesia with the patient or authorized representative who has indicated his/her understanding and acceptance.       Plan Discussed with: CRNA and Anesthesiologist  Anesthesia Plan Comments:         Anesthesia Quick Evaluation

## 2022-05-04 NOTE — Op Note (Signed)
[facility] Patient Name: Lauren [name] Procedure Date: 05/04/2022 MRN: [id number] Attending MD: Carlota Raspberry. Havery Moros , MD Date of Birth: 1960/11/05 CSN: [id number] Age: 61 Admit Type: Outpatient Procedure:                Colonoscopy Indications:              Disease activity assessment of chronic ulcerative                            pancolitis - failed numerous biologics in the past                            (anti-TNF, thiopurines, Entyvio, Stelara - now on                            Rinvoq and clinically in remission. Case performed                            at the hospital given history of bleeding disorder.                            DDAVP administered pre-procedure Providers:                Remo Lipps P. Havery Moros, MD, Benay Pillow, RN, [facility] Technician, Technician Referring MD:              Medicines:                Monitored Anesthesia Care Complications:            No immediate complications. Estimated blood loss:                            Minimal. Estimated Blood Loss:     Estimated blood loss was minimal. Procedure:                Pre-Anesthesia Assessment:                           - Prior to the procedure, a History and Physical                            was performed, and patient medications and                            allergies were reviewed. The patient's tolerance of                            previous anesthesia was also reviewed. The risks                            and benefits of the procedure and the sedation                            options and  risks were discussed with the patient.                            All questions were answered, and informed consent                            was obtained. Prior Anticoagulants: The patient has                            taken no previous anticoagulant or antiplatelet                            agents. ASA Grade Assessment: III - A patient with                             severe systemic disease. After reviewing the risks                            and benefits, the patient was deemed in                            satisfactory condition to undergo the procedure.                           After obtaining informed consent, the colonoscope                            was passed under direct vision. Throughout the                            procedure, the patient's blood pressure, pulse, and                            oxygen saturations were monitored continuously. The                            PCF-HQ190L ([id number]) Olympus colonoscope was                            introduced through the anus and advanced to the the                            terminal ileum, with identification of the                            appendiceal orifice and IC valve. The colonoscopy                            was performed without difficulty. The patient                            tolerated the procedure well. The quality of the  bowel preparation was good. The terminal ileum,                            ileocecal valve, appendiceal orifice, and rectum                            were photographed. Scope In: 2:07:31 PM Scope Out: 2:20:59 PM Scope Withdrawal Time: 0 hours 10 minutes 59 seconds  Total Procedure Duration: 0 hours 13 minutes 28 seconds  Findings:      The perianal and digital rectal examinations were normal.      The terminal ileum appeared normal.      Internal hemorrhoids were found during retroflexion. The hemorrhoids       were small.      The exam was otherwise without abnormality. Interval healing of the       colonic mucosa - no active inflammation. No polyps.      Biopsies were taken with a cold forceps in the rectum, in the sigmoid       colon, in the descending colon, in the transverse colon and in the       ascending colon for histology. Impression:               - The examined portion of the ileum was normal.                            - Internal hemorrhoids.                           - The examination was otherwise normal.                           - Biopsies were taken with a cold forceps for                            histology in the rectum, in the sigmoid colon, in                            the descending colon, in the transverse colon and                            in the ascending colon.                           Interval healing of colonic mucosa on Rinvoq,                            endoscopically appears to be in remission. Moderate Sedation:      No moderate sedation, case performed with MAC Recommendation:           - Patient has a contact number available for                            emergencies. The signs and symptoms of potential  delayed complications were discussed with the                            patient. Return to normal activities tomorrow.                            Written discharge instructions were provided to the                            patient.                           - Resume previous diet.                           - Continue present medications.                           - Per hematology: take tranexamic acid 2 tablets                            (1300 mg) by mouth 3 times a day for 5 days after                            colonoscopy if biopsies performed                           - Await pathology results. Procedure Code(s):        --- Professional ---                           [phone], Colonoscopy, flexible; with biopsy, single                            or multiple Diagnosis Code(s):        --- Professional ---                           K64.8, Other hemorrhoids                           K51.00, Ulcerative (chronic) pancolitis without                            complications CPT copyright 2019 American Medical Association. All rights reserved. The codes documented in this report are preliminary and upon coder review may  be revised to meet current  compliance requirements. Remo Lipps P. Havery Moros, MD 05/04/2022 2:27:32 PM This report has been signed electronically. Number of Addenda: 0

## 2022-05-04 NOTE — H&P (Signed)
[facility] History and Physical   Primary Care Physician:  Bernerd Limbo, MD   Reason for Procedure:   Ulcerative colitis  Plan:    colonoscopy     HPI: Lauren [name] is a 61 y.o. female  here for colonoscopy surveillance of ulcerative colitis. Pancolonic UC - has failed multiple regimens in the past including anti-TNFs, Entyvio, 6MP, Stelara, mesalamine. Now on Rinvoq and clinically doing well, here for disease activity assessment with colonoscopy.  Recall she has Von Willebrands and at risk for bleeding. Case done at the hospital as she has DDAVP infusion prior to the exam and then Stimate administered post procedure per hematology. Patient denies any bowel symptoms at this time. Otherwise feels well without any cardiopulmonary symptoms.   I have discussed risks / benefits of anesthesia and endoscopic procedure with Chalmers Cater and they wish to proceed with the exams as outlined today.    Past Medical History:  Diagnosis Date   Allergy    Anemia    Anxiety    Cellulitis of left breast 09/17/2015   diagnosed at Urgent Care- on ABX   Clostridium difficile diarrhea    Clotting disorder ([place])    Depression    takes Prozac daily   Hearing loss    right ear   History of colon polyps    benign   Hyperlipidemia    Hypertension    takes Metoprolol daily   Hypokalemia    takes Potassium every other day   Inflammatory polyps of colon (HCC)    Internal hemorrhoids    Mucosal abnormality of stomach    Osteopenia    Osteoporosis    Personal history of meningioma of the brain    Platelet disorder (HCC)    Pneumonia    hx of-about 4+ yrs ago   Seasonal allergies    takes Allegra as needed and Flonase daily   SVT (supraventricular tachycardia) (HCC)    Tennis elbow    Ulcerative colitis    On Entyvio q 4 week dosing   Urinary urgency    Vitamin B12 deficiency    Von Willebrands disease ([place])     Past Surgical History:  Procedure Laterality Date   BAHA  REVISION Right 03/15/2016   Procedure: REVISION RIGHT BAHA HEARING IMPLANT ;  Surgeon: Vicie Mutters, MD;  Location: [place];  Service: ENT;  Laterality: Right;   BIOPSY  01/24/2019   Procedure: BIOPSY;  Surgeon: Yetta Flock, MD;  Location: [facility];  Service: Gastroenterology;;   BIOPSY  01/15/2020   Procedure: BIOPSY;  Surgeon: Yetta Flock, MD;  Location: [facility];  Service: Gastroenterology;;   BIOPSY  03/22/2021   Procedure: BIOPSY;  Surgeon: Yetta Flock, MD;  Location: [facility];  Service: Gastroenterology;;   BRAIN MENINGIOMA EXCISION  05/2007   CARDIAC ELECTROPHYSIOLOGY STUDY AND ABLATION  10/22/09   COLONOSCOPY N/A 07/29/2013   Procedure: COLONOSCOPY;  Surgeon: Lafayette Dragon, MD;  Location: [facility];  Service: Endoscopy;  Laterality: N/A;   COLONOSCOPY WITH PROPOFOL N/A 09/21/2015   Procedure: COLONOSCOPY WITH PROPOFOL;  Surgeon: Manus Gunning, MD;  Location: [facility];  Service: Gastroenterology;  Laterality: N/A;   COLONOSCOPY WITH PROPOFOL N/A 10/30/2017   Procedure: COLONOSCOPY WITH PROPOFOL;  Surgeon: Yetta Flock, MD;  Location: [facility];  Service: Gastroenterology;  Laterality: N/A;   COLONOSCOPY WITH PROPOFOL N/A 01/24/2019   Procedure: COLONOSCOPY WITH PROPOFOL;  Surgeon: Yetta Flock, MD;  Location: [facility];  Service: Gastroenterology;  Laterality: N/A;   COLONOSCOPY WITH PROPOFOL N/A 01/15/2020   Procedure: COLONOSCOPY WITH PROPOFOL;  Surgeon: Yetta Flock, MD;  Location: [facility];  Service: Gastroenterology;  Laterality: N/A;  needs DDAVP 30-60 minutes prior to procedure.   COLONOSCOPY WITH PROPOFOL N/A 03/22/2021   Procedure: COLONOSCOPY WITH PROPOFOL;  Surgeon: Yetta Flock, MD;  Location: [facility];  Service: Gastroenterology;  Laterality: N/A;   ESOPHAGOGASTRODUODENOSCOPY     HEMOSTASIS CLIP PLACEMENT  03/22/2021   Procedure: HEMOSTASIS CLIP PLACEMENT;  Surgeon: Yetta Flock,  MD;  Location: [facility];  Service: Gastroenterology;;   IMPLANTATION BONE ANCHORED HEARING AID Right    POLYPECTOMY  01/24/2019   Procedure: POLYPECTOMY;  Surgeon: Yetta Flock, MD;  Location: [facility];  Service: Gastroenterology;;   POLYPECTOMY  01/15/2020   Procedure: POLYPECTOMY;  Surgeon: Yetta Flock, MD;  Location: [facility];  Service: Gastroenterology;;   POLYPECTOMY  03/22/2021   Procedure: POLYPECTOMY;  Surgeon: Yetta Flock, MD;  Location: [facility];  Service: Gastroenterology;;   WISDOM TOOTH EXTRACTION      Prior to Admission medications   Medication Sig Start Date End Date Taking? Authorizing Provider  Cholecalciferol (VITAMIN D3) 125 MCG (5000 UT) CAPS Take 5,000 Units by mouth daily.   Yes [provider]  FLUoxetine (PROZAC) 20 MG capsule Take 20 mg by mouth daily 06/27/18  Yes Timmothy Euler, [place] D, PA-C  metoprolol succinate (TOPROL-XL) 50 MG 24 hr tablet Take 50 mg by mouth daily. 11/07/21  Yes [provider]  NON FORMULARY Take 1 tablet by mouth daily. Terrazyme Teacher, music) Digestive enzyme   Yes [provider]  OVER THE COUNTER MEDICATION Take 1 capsule by mouth daily. Doterra  Micro plex Vmz   Yes [provider]  OVER THE COUNTER MEDICATION Take 1 tablet by mouth daily. Doterra Dr-prime culture complex   Yes [provider]  Sodium Sulfate-Mag Sulfate-KCl (SUTAB) [phone] MG TABS Take 1 kit by mouth as directed. BIN: [id number] PCN: CN GROUP: [id number] MEMBER ID: [id number]; RUN AS CASH 03/29/22  Yes Judithann Villamar, Carlota Raspberry, MD  tranexamic acid (LYSTEDA) 650 MG TABS tablet Take 1,300 mg by mouth 3 (three) times daily as needed (FOR INTERNAL PROCEDURE TO STOP BLEEDING). "For internal procedure to stop bleeding 04/24/22  Yes [provider]  Upadacitinib ER (RINVOQ) 15 MG TB24 Take one tablet by mouth once daily Patient taking differently: Take 15 mg by mouth daily. 04/11/22  Yes  Laverne Hursey, Carlota Raspberry, MD  clotrimazole-betamethasone (LOTRISONE) cream Apply 1 application topically daily as needed (Eczema or inflamed in skull). 04/15/20   [provider]  hydrocortisone 2.5 % ointment Apply 1 application topically 2 (two) times daily as needed (for eczema).    [provider]    Current Facility-Administered Medications  Medication Dose Route Frequency Provider Last Rate Last Admin   0.9 %  sodium chloride infusion   Intravenous Continuous Jianni Batten, Carlota Raspberry, MD 20 mL/hr at 05/04/22 1251 New Bag at 05/04/22 1251   desmopressin (DDAVP) 20 mcg in sodium chloride 0.9 % 50 mL IVPB  20 mcg Intravenous On Call to OR Minda Ditto, RPH 100 mL/hr at 05/04/22 1250 20 mcg at 05/04/22 1250    Allergies as of 03/13/2022 - Review Complete 01/16/2022  Allergen Reaction Noted   Atenolol Swelling    Nsaids Other (See Comments) 12/19/2017   Sulfamethoxazole Other (See Comments) and Rash 02/27/2019   Asa [aspirin]  05/21/2018   Cephalexin Other (See Comments) 06/24/2008  Corn starch Other (See Comments) 03/17/2021   Ibuprofen  05/21/2018   Mercaptopurine Hives and Itching 01/14/2020   Soy allergy Other (See Comments) 03/14/2021    Family History  Problem Relation Age of Onset   Heart disease Maternal Grandfather    Heart disease Mother    Stroke Mother    Diabetes Maternal Uncle    Colon cancer Neg Hx    Breast cancer Neg Hx    Esophageal cancer Neg Hx    Rectal cancer Neg Hx    Stomach cancer Neg Hx     Social History   Socioeconomic History   Marital status: Married    Spouse name: Ronalee Belts   Number of children: 1   Years of education: [employer] School Diploma   Highest education level: Not on file  Occupational History   Occupation: Print production planner: [employer]  Tobacco Use   Smoking status: Never    Passive exposure: Never   Smokeless tobacco: Never  Vaping Use   Vaping Use: Never used  Substance and  Sexual Activity   Alcohol use: No    Alcohol/week: 0.0 standard drinks of alcohol   Drug use: No   Sexual activity: Yes    Partners: Male    Birth control/protection: Post-menopausal  Other Topics Concern   Not on file  Social History Narrative   Lives with her husband.   Their daughter lives nearby with her own family.   Social Determinants of Health   Financial Resource Strain: Low Risk  (12/07/2017)   Overall Financial Resource Strain (CARDIA)    Difficulty of Paying Living Expenses: Not hard at all  Food Insecurity: No Food Insecurity (12/07/2017)   Hunger Vital Sign    Worried About Running Out of Food in the Last Year: Never true    Ran Out of Food in the Last Year: Never true  Transportation Needs: No Transportation Needs (12/07/2017)   PRAPARE - Hydrologist (Medical): No    Lack of Transportation (Non-Medical): No  Physical Activity: Inactive (12/07/2017)   Exercise Vital Sign    Days of Exercise per Week: 0 days    Minutes of Exercise per Session: 0 min  Stress: No Stress Concern Present (12/07/2017)   [place]    Feeling of Stress : Only a little  Social Connections: Socially Integrated (12/07/2017)   Social Connection and Isolation Panel [NHANES]    Frequency of Communication with Friends and Family: More than three times a week    Frequency of Social Gatherings with Friends and Family: Twice a week    Attends Religious Services: More than 4 times per year    Active Member of [employer] or Organizations: Yes    Attends Music therapist: More than 4 times per year    Marital Status: Married  Human resources officer Violence: Not At Risk (12/07/2017)   Humiliation, Afraid, Rape, and Kick questionnaire    Fear of Current or Ex-Partner: No    Emotionally Abused: No    Physically Abused: No    Sexually Abused: No    Review of Systems: All other review of systems negative  except as mentioned in the HPI.  Physical Exam: Vital signs BP (!) 140/87   Pulse (!) 53   Temp 97.7 F (36.5 C) (Temporal)   Resp 14   Ht 4' 11"  (1.499 m)   Wt 65.8 kg   LMP  11/26/2008   SpO2 100%   BMI 29.29 kg/m   General:   Alert,  Well-developed, pleasant and cooperative in NAD Lungs:  Clear throughout to auscultation.   Heart:  Regular rate and rhythm Abdomen:  Soft, nontender and nondistended.   Neuro/Psych:  Alert and cooperative. Normal mood and affect. A and O x 3  Jolly Mango, MD [facility]

## 2022-05-04 NOTE — Discharge Instructions (Signed)
YOU HAD AN ENDOSCOPIC PROCEDURE TODAY: Refer to the procedure report and other information in the discharge instructions given to you for any specific questions about what was found during the examination. If this information does not answer your questions, please call [place] office at [phone] to clarify.  ° °YOU SHOULD EXPECT: Some feelings of bloating in the abdomen. Passage of more gas than usual. Walking can help get rid of the air that was put into your GI tract during the procedure and reduce the bloating. If you had a lower endoscopy (such as a colonoscopy or flexible sigmoidoscopy) you may notice spotting of blood in your stool or on the toilet paper. Some abdominal soreness may be present for a day or two, also. ° °DIET: Your first meal following the procedure should be a light meal and then it is ok to progress to your normal diet. A half-sandwich or bowl of soup is an example of a good first meal. Heavy or fried foods are harder to digest and may make you feel nauseous or bloated. Drink plenty of fluids but you should avoid alcoholic beverages for 24 hours. If you had a esophageal dilation, please see attached instructions for diet.   ° °ACTIVITY: Your care partner should take you home directly after the procedure. You should plan to take it easy, moving slowly for the rest of the day. You can resume normal activity the day after the procedure however YOU SHOULD NOT DRIVE, use power tools, machinery or perform tasks that involve climbing or major physical exertion for 24 hours (because of the sedation medicines used during the test).  ° °SYMPTOMS TO REPORT IMMEDIATELY: °A gastroenterologist can be reached at any hour. Please call [phone]  for any of the following symptoms:  °Following lower endoscopy (colonoscopy, flexible sigmoidoscopy) °Excessive amounts of blood in the stool  °Significant tenderness, worsening of abdominal pains  °Swelling of the abdomen that is new, acute  °Fever of 100° or  higher  °Following upper endoscopy (EGD, EUS, ERCP, esophageal dilation) °Vomiting of blood or coffee ground material  °New, significant abdominal pain  °New, significant chest pain or pain under the shoulder blades  °Painful or persistently difficult swallowing  °New shortness of breath  °Black, tarry-looking or red, bloody stools ° °FOLLOW UP:  °If any biopsies were taken you will be contacted by phone or by letter within the next 1-3 weeks. Call [phone]  if you have not heard about the biopsies in 3 weeks.  °Please also call with any specific questions about appointments or follow up tests. ° °

## 2022-05-05 ENCOUNTER — Telehealth: Payer: Self-pay | Admitting: Gastroenterology

## 2022-05-05 ENCOUNTER — Encounter (HOSPITAL_COMMUNITY): Payer: Self-pay | Admitting: Gastroenterology

## 2022-05-05 ENCOUNTER — Other Ambulatory Visit (INDEPENDENT_AMBULATORY_CARE_PROVIDER_SITE_OTHER): Payer: BC Managed Care – PPO

## 2022-05-05 DIAGNOSIS — D6800 Von Willebrand disease, unspecified: Secondary | ICD-10-CM | POA: Diagnosis not present

## 2022-05-05 DIAGNOSIS — Z79899 Other long term (current) drug therapy: Secondary | ICD-10-CM

## 2022-05-05 DIAGNOSIS — E871 Hypo-osmolality and hyponatremia: Secondary | ICD-10-CM | POA: Diagnosis not present

## 2022-05-05 DIAGNOSIS — K51019 Ulcerative (chronic) pancolitis with unspecified complications: Secondary | ICD-10-CM | POA: Diagnosis not present

## 2022-05-05 LAB — CBC WITH DIFFERENTIAL/PLATELET
Basophils Absolute: 0 10*3/uL (ref 0.0–0.1)
Basophils Relative: 0.2 % (ref 0.0–3.0)
Eosinophils Absolute: 0.1 10*3/uL (ref 0.0–0.7)
Eosinophils Relative: 1 % (ref 0.0–5.0)
HCT: 35.3 % — ABNORMAL LOW (ref 36.0–46.0)
Hemoglobin: 12.1 g/dL (ref 12.0–15.0)
Lymphocytes Relative: 26.8 % (ref 12.0–46.0)
Lymphs Abs: 1.4 10*3/uL (ref 0.7–4.0)
MCHC: 34.4 g/dL (ref 30.0–36.0)
MCV: 89.7 fl (ref 78.0–100.0)
Monocytes Absolute: 0.4 10*3/uL (ref 0.1–1.0)
Monocytes Relative: 8.1 % (ref 3.0–12.0)
Neutro Abs: 3.4 10*3/uL (ref 1.4–7.7)
Neutrophils Relative %: 63.9 % (ref 43.0–77.0)
Platelets: 205 10*3/uL (ref 150.0–400.0)
RBC: 3.93 Mil/uL (ref 3.87–5.11)
RDW: 14.3 % (ref 11.5–15.5)
WBC: 5.3 10*3/uL (ref 4.0–10.5)

## 2022-05-05 LAB — COMPREHENSIVE METABOLIC PANEL
ALT: 10 U/L (ref 0–35)
AST: 21 U/L (ref 0–37)
Albumin: 4.2 g/dL (ref 3.5–5.2)
Alkaline Phosphatase: 49 U/L (ref 39–117)
BUN: 18 mg/dL (ref 6–23)
CO2: 29 mEq/L (ref 19–32)
Calcium: 9.6 mg/dL (ref 8.4–10.5)
Chloride: 100 mEq/L (ref 96–112)
Creatinine, Ser: 0.87 mg/dL (ref 0.40–1.20)
GFR: 72.03 mL/min (ref >60.00)
Glucose, Bld: 115 mg/dL — ABNORMAL HIGH (ref 70–99)
Potassium: 4.2 mEq/L (ref 3.5–5.1)
Sodium: 134 mEq/L — ABNORMAL LOW (ref 135–145)
Total Bilirubin: 0.5 mg/dL (ref 0.2–1.2)
Total Protein: 7.2 g/dL (ref 6.0–8.3)

## 2022-05-05 LAB — BASIC METABOLIC PANEL
BUN: 18 mg/dL (ref 6–23)
CO2: 29 mEq/L (ref 19–32)
Calcium: 9.6 mg/dL (ref 8.4–10.5)
Chloride: 100 mEq/L (ref 96–112)
Creatinine, Ser: 0.87 mg/dL (ref 0.40–1.20)
GFR: 72.03 mL/min (ref >60.00)
Glucose, Bld: 115 mg/dL — ABNORMAL HIGH (ref 70–99)
Potassium: 4.2 mEq/L (ref 3.5–5.1)
Sodium: 134 mEq/L — ABNORMAL LOW (ref 135–145)

## 2022-05-05 NOTE — Telephone Encounter (Signed)
Returned call to patient. Pt stated that she thinks that she usually has to have labs drawn after getting DDAVP to check her sodium level. I told pt that I would check with you to see if she needed labs at this time. Please advise, thanks.

## 2022-05-05 NOTE — Telephone Encounter (Signed)
Patient called states she wants to talk to a nurse regarding her desmopressin 2306 mg. Requesting a call back as soon as possible. Please call to advise

## 2022-05-05 NOTE — Telephone Encounter (Signed)
If it would make her more comfortable to check a BMET we can do that today, although she had a one time dose and given not a chronic therapy, hopefully nothing to worry about

## 2022-05-05 NOTE — Telephone Encounter (Signed)
Returned call to patient. I informed that we have placed lab order and she can stop by today at her convenience. Pt verbalized understanding and had no concerns at the end of the call.

## 2022-05-08 LAB — SURGICAL PATHOLOGY

## 2022-05-11 ENCOUNTER — Other Ambulatory Visit (HOSPITAL_COMMUNITY): Payer: Self-pay

## 2022-05-12 ENCOUNTER — Other Ambulatory Visit (HOSPITAL_COMMUNITY): Payer: Self-pay

## 2022-05-22 ENCOUNTER — Other Ambulatory Visit (HOSPITAL_COMMUNITY): Payer: Self-pay

## 2022-05-24 ENCOUNTER — Telehealth: Payer: Self-pay

## 2022-05-24 DIAGNOSIS — D6800 Von Willebrand disease, unspecified: Secondary | ICD-10-CM

## 2022-05-24 DIAGNOSIS — Z79899 Other long term (current) drug therapy: Secondary | ICD-10-CM

## 2022-05-24 DIAGNOSIS — E871 Hypo-osmolality and hyponatremia: Secondary | ICD-10-CM

## 2022-05-24 DIAGNOSIS — K51019 Ulcerative (chronic) pancolitis with unspecified complications: Secondary | ICD-10-CM

## 2022-05-24 NOTE — Telephone Encounter (Signed)
MyChart message sent to patient to go to the lab in October for cbc and cmet

## 2022-05-24 NOTE — Telephone Encounter (Signed)
-----   Message from Roetta Sessions, [place] sent at 01/16/2022  2:48 PM EDT ----- Regarding: labs due in October Patient will be due for CBC and CMET in early October

## 2022-05-29 NOTE — Telephone Encounter (Signed)
Patient aware to go to the lab for lipid panel and CBC for Dr. Havery Moros

## 2022-05-31 ENCOUNTER — Other Ambulatory Visit (INDEPENDENT_AMBULATORY_CARE_PROVIDER_SITE_OTHER): Payer: BC Managed Care – PPO

## 2022-05-31 DIAGNOSIS — K51019 Ulcerative (chronic) pancolitis with unspecified complications: Secondary | ICD-10-CM

## 2022-05-31 DIAGNOSIS — E871 Hypo-osmolality and hyponatremia: Secondary | ICD-10-CM

## 2022-05-31 DIAGNOSIS — D6800 Von Willebrand disease, unspecified: Secondary | ICD-10-CM

## 2022-05-31 DIAGNOSIS — Z79899 Other long term (current) drug therapy: Secondary | ICD-10-CM

## 2022-05-31 LAB — COMPREHENSIVE METABOLIC PANEL
ALT: 11 U/L (ref 0–35)
AST: 21 U/L (ref 0–37)
Albumin: 4.4 g/dL (ref 3.5–5.2)
Alkaline Phosphatase: 50 U/L (ref 39–117)
BUN: 17 mg/dL (ref 6–23)
CO2: 31 mEq/L (ref 19–32)
Calcium: 9.3 mg/dL (ref 8.4–10.5)
Chloride: 100 mEq/L (ref 96–112)
Creatinine, Ser: 0.86 mg/dL (ref 0.40–1.20)
GFR: 73 mL/min (ref >60.00)
Glucose, Bld: 97 mg/dL (ref 70–99)
Potassium: 3.9 mEq/L (ref 3.5–5.1)
Sodium: 137 mEq/L (ref 135–145)
Total Bilirubin: 0.5 mg/dL (ref 0.2–1.2)
Total Protein: 7.3 g/dL (ref 6.0–8.3)

## 2022-05-31 LAB — CBC WITH DIFFERENTIAL/PLATELET
Basophils Absolute: 0 10*3/uL (ref 0.0–0.1)
Basophils Relative: 0.5 % (ref 0.0–3.0)
Eosinophils Absolute: 0 10*3/uL (ref 0.0–0.7)
Eosinophils Relative: 1 % (ref 0.0–5.0)
HCT: 37 % (ref 36.0–46.0)
Hemoglobin: 12.6 g/dL (ref 12.0–15.0)
Lymphocytes Relative: 44.7 % (ref 12.0–46.0)
Lymphs Abs: 1.9 10*3/uL (ref 0.7–4.0)
MCHC: 33.9 g/dL (ref 30.0–36.0)
MCV: 90.5 fl (ref 78.0–100.0)
Monocytes Absolute: 0.3 10*3/uL (ref 0.1–1.0)
Monocytes Relative: 7.7 % (ref 3.0–12.0)
Neutro Abs: 1.9 10*3/uL (ref 1.4–7.7)
Neutrophils Relative %: 46.1 % (ref 43.0–77.0)
Platelets: 216 10*3/uL (ref 150.0–400.0)
RBC: 4.09 Mil/uL (ref 3.87–5.11)
RDW: 14.4 % (ref 11.5–15.5)
WBC: 4.2 10*3/uL (ref 4.0–10.5)

## 2022-06-07 ENCOUNTER — Other Ambulatory Visit (HOSPITAL_COMMUNITY): Payer: Self-pay

## 2022-06-13 ENCOUNTER — Other Ambulatory Visit: Payer: Self-pay | Admitting: Physician Assistant

## 2022-06-13 DIAGNOSIS — Z1231 Encounter for screening mammogram for malignant neoplasm of breast: Secondary | ICD-10-CM

## 2022-06-14 ENCOUNTER — Other Ambulatory Visit (HOSPITAL_COMMUNITY): Payer: Self-pay

## 2022-06-22 ENCOUNTER — Other Ambulatory Visit (HOSPITAL_COMMUNITY): Payer: Self-pay

## 2022-07-06 ENCOUNTER — Other Ambulatory Visit (HOSPITAL_COMMUNITY): Payer: Self-pay

## 2022-07-06 ENCOUNTER — Telehealth: Payer: Self-pay | Admitting: Pharmacy Technician

## 2022-07-06 NOTE — Telephone Encounter (Signed)
Patient Advocate Encounter  Received notification that renewal prior authorization for RINVOQ 15MG is required.   PA submitted on 11.9.23 Key [id number] Status is pending   Luciano Cutter, CPhT Patient Advocate Phone: [phone]    Will need to try to reprocess on 11.30.23

## 2022-07-07 ENCOUNTER — Other Ambulatory Visit (HOSPITAL_COMMUNITY): Payer: Self-pay

## 2022-07-10 ENCOUNTER — Other Ambulatory Visit (HOSPITAL_COMMUNITY): Payer: Self-pay

## 2022-07-10 ENCOUNTER — Telehealth: Payer: Self-pay | Admitting: Pharmacy Technician

## 2022-07-10 NOTE — Telephone Encounter (Signed)
-----   Message from Roetta Sessions, [place] sent at 07/10/2022  3:37 PM EST ----- Regarding: PA for 15 mg RINVOQ Hey there. I hope you are doing well. Can you help me understand what the status is with this patient's Rinvoq PA?  The initiation dose is 45 mg for 8 weeks but the PA for 45 mg was approved for a year (from 07-28-2021 through 07-27-22).  She started on the maintenance dose of 15 mg  months ago but now it says we can't reprocess the PA for 38m until 07-27-22. ??  Thank you, [name]

## 2022-07-12 ENCOUNTER — Other Ambulatory Visit (HOSPITAL_COMMUNITY): Payer: Self-pay

## 2022-07-12 NOTE — Telephone Encounter (Signed)
Did not initiate the original PA, however when submitted for the loading dose (typically 51m for 8 weeks or 12 weeks) the questions within the PA ask along the lines if maintenance dose will be 139mor 3046mIf approved the PA is combined. The expiration date would still be a year from date of approval, but the first two to three months depending on regiment would be for the 37m105md the remaining months would be for whatever the maintenance dose would be.

## 2022-07-12 NOTE — Telephone Encounter (Signed)
Great! I have her on my list of renewals to resubmit the PA on or after 11.30.23. Pt can fill with her insurance on 11.18.23, so she will still have supply while I complete her PA. Please let me know if further assistance is needed.

## 2022-07-17 ENCOUNTER — Other Ambulatory Visit (HOSPITAL_COMMUNITY): Payer: Self-pay

## 2022-07-19 ENCOUNTER — Ambulatory Visit
Admission: RE | Admit: 2022-07-19 | Discharge: 2022-07-19 | Disposition: A | Discharge status: Home / Self Care | Payer: BC Managed Care – PPO | Source: Ambulatory Visit | Attending: Physician Assistant | Admitting: Physician Assistant

## 2022-07-19 DIAGNOSIS — Z1231 Encounter for screening mammogram for malignant neoplasm of breast: Secondary | ICD-10-CM

## 2022-07-24 ENCOUNTER — Telehealth: Payer: Self-pay | Admitting: Pharmacy Technician

## 2022-07-24 NOTE — Telephone Encounter (Signed)
Patient Advocate Encounter  Received notification from [facility] that prior authorization for RINVOQ 15MG is required.   PA submitted on 11.27.23 Key [id number] Status is pending

## 2022-07-25 ENCOUNTER — Other Ambulatory Visit (HOSPITAL_COMMUNITY): Payer: Self-pay

## 2022-07-25 NOTE — Telephone Encounter (Signed)
Patient Advocate Encounter  Prior Authorization for RINVOQ 15MG has been approved.    PA# --- Effective dates: 11.27.23 through 11.25.24

## 2022-07-28 ENCOUNTER — Ambulatory Visit: Payer: BC Managed Care – PPO

## 2022-08-10 ENCOUNTER — Other Ambulatory Visit: Payer: Self-pay

## 2022-08-11 ENCOUNTER — Other Ambulatory Visit: Payer: Self-pay

## 2022-08-14 ENCOUNTER — Other Ambulatory Visit: Payer: Self-pay

## 2022-08-23 ENCOUNTER — Ambulatory Visit
Admission: EM | Admit: 2022-08-23 | Discharge: 2022-08-23 | Disposition: A | Discharge status: Home / Self Care | Payer: BC Managed Care – PPO | Attending: Nurse Practitioner | Admitting: Nurse Practitioner

## 2022-08-23 DIAGNOSIS — H1033 Unspecified acute conjunctivitis, bilateral: Secondary | ICD-10-CM

## 2022-08-23 MED ORDER — POLYMYXIN B-TRIMETHOPRIM 10000-0.1 UNIT/ML-% OP SOLN: 1.0000 [drp] | Freq: Four times a day (QID) | OPHTHALMIC | 0 refills | Status: AC

## 2022-08-23 NOTE — Discharge Instructions (Signed)
Use eyedrops as prescribed.   Cool compresses to the eyes to help with pain or swelling. May continue the over-the-counter eyedrops you are using to help keep the eyes moist and decreased redness. Strict handwashing when applying medication.  Avoid rubbing or manipulating the eyes while symptoms persist. Discard any eye make-up that were using prior to your symptoms starting. Follow-up with your primary care physician or with your eye doctor if symptoms fail to improve.

## 2022-08-23 NOTE — ED Provider Notes (Signed)
[facility]    CSN: [id number] Arrival date & time: 08/23/22  1539      History   Chief Complaint Chief Complaint  Patient presents with   Conjunctivitis    HPI Lauren [name] is a 61 y.o. female.   The history is provided by the patient.   The patient presents for complaints of redness, drainage, green discharge, and itchiness in both eyes when she woke up approximately 2 days ago.  She reports symptoms started in the left eye first.  Patient denies fever, chills, light sensitivity, loss of vision, change in vision, or decreased vision.  Patient states vision has been blurred intermittently since her symptoms started.  She reports that she recently was diagnosed with dry by her ophthalmologist.  Patient reports recent sinus symptoms.  Past Medical History:  Diagnosis Date   Allergy    Anemia    Anxiety    Cellulitis of left breast 09/17/2015   diagnosed at Urgent Care- on ABX   Clostridium difficile diarrhea    Clotting disorder ([place])    Depression    takes Prozac daily   Hearing loss    right ear   History of colon polyps    benign   Hyperlipidemia    Hypertension    takes Metoprolol daily   Hypokalemia    takes Potassium every other day   Inflammatory polyps of colon ([place])    Internal hemorrhoids    Mucosal abnormality of stomach    Osteopenia    Osteoporosis    Personal history of meningioma of the brain    Platelet disorder ([place])    Pneumonia    hx of-about 4+ yrs ago   Seasonal allergies    takes Allegra as needed and Flonase daily   SVT (supraventricular tachycardia)    Tennis elbow    Ulcerative colitis    On Entyvio q 4 week dosing   Urinary urgency    Vitamin B12 deficiency    Von Willebrands disease ([place])     Patient Active Problem List   Diagnosis Date Noted   Von Willebrand disease ([place])    Vitamin D insufficiency 09/26/2016   Microscopic hematuria 09/26/2016   Left sided colitis without complications ([place])    Benign  neoplasm of colon 07/29/2013   [place] platelet disorder ([place]) 07/17/2013   Osteoporosis 04/04/2013   VITAMIN B12 DEFICIENCY 04/05/2010   Sinoatrial node dysfunction ([place]) 10/15/2009   Reactive depression 09/17/2009   HEMORRHOIDS-EXTERNAL 05/18/2009   Anxiety state 02/26/2008   Essential hypertension 02/26/2008   Ulcerative colitis ([place]) 02/26/2008   COUGH, CHRONIC 02/26/2008    Past Surgical History:  Procedure Laterality Date   BAHA REVISION Right 03/15/2016   Procedure: REVISION RIGHT BAHA HEARING IMPLANT ;  Surgeon: Vicie Mutters, MD;  Location: [facility] OR;  Service: ENT;  Laterality: Right;   BIOPSY  01/24/2019   Procedure: BIOPSY;  Surgeon: Yetta Flock, MD;  Location: [facility];  Service: Gastroenterology;;   BIOPSY  01/15/2020   Procedure: BIOPSY;  Surgeon: Yetta Flock, MD;  Location: [facility];  Service: Gastroenterology;;   BIOPSY  03/22/2021   Procedure: BIOPSY;  Surgeon: Yetta Flock, MD;  Location: [facility];  Service: Gastroenterology;;   BIOPSY  05/04/2022   Procedure: BIOPSY;  Surgeon: Yetta Flock, MD;  Location: [facility];  Service: Gastroenterology;;   BRAIN MENINGIOMA EXCISION  05/2007   CARDIAC ELECTROPHYSIOLOGY STUDY AND ABLATION  10/22/09   COLONOSCOPY N/A 07/29/2013   Procedure: COLONOSCOPY;  Surgeon: Lafayette Dragon, MD;  Location: [facility];  Service: Endoscopy;  Laterality: N/A;   COLONOSCOPY WITH PROPOFOL N/A 09/21/2015   Procedure: COLONOSCOPY WITH PROPOFOL;  Surgeon: Manus Gunning, MD;  Location: [facility];  Service: Gastroenterology;  Laterality: N/A;   COLONOSCOPY WITH PROPOFOL N/A 10/30/2017   Procedure: COLONOSCOPY WITH PROPOFOL;  Surgeon: Yetta Flock, MD;  Location: [facility];  Service: Gastroenterology;  Laterality: N/A;   COLONOSCOPY WITH PROPOFOL N/A 01/24/2019   Procedure: COLONOSCOPY WITH PROPOFOL;  Surgeon: Yetta Flock, MD;  Location: [facility];  Service: Gastroenterology;   Laterality: N/A;   COLONOSCOPY WITH PROPOFOL N/A 01/15/2020   Procedure: COLONOSCOPY WITH PROPOFOL;  Surgeon: Yetta Flock, MD;  Location: [facility];  Service: Gastroenterology;  Laterality: N/A;  needs DDAVP 30-60 minutes prior to procedure.   COLONOSCOPY WITH PROPOFOL N/A 03/22/2021   Procedure: COLONOSCOPY WITH PROPOFOL;  Surgeon: Yetta Flock, MD;  Location: [facility];  Service: Gastroenterology;  Laterality: N/A;   COLONOSCOPY WITH PROPOFOL N/A 05/04/2022   Procedure: COLONOSCOPY WITH PROPOFOL;  Surgeon: Yetta Flock, MD;  Location: [facility];  Service: Gastroenterology;  Laterality: N/A;   ESOPHAGOGASTRODUODENOSCOPY     HEMOSTASIS CLIP PLACEMENT  03/22/2021   Procedure: HEMOSTASIS CLIP PLACEMENT;  Surgeon: Yetta Flock, MD;  Location: [facility];  Service: Gastroenterology;;   IMPLANTATION BONE ANCHORED HEARING AID Right    POLYPECTOMY  01/24/2019   Procedure: POLYPECTOMY;  Surgeon: Yetta Flock, MD;  Location: [facility];  Service: Gastroenterology;;   POLYPECTOMY  01/15/2020   Procedure: POLYPECTOMY;  Surgeon: Yetta Flock, MD;  Location: [facility];  Service: Gastroenterology;;   POLYPECTOMY  03/22/2021   Procedure: POLYPECTOMY;  Surgeon: Yetta Flock, MD;  Location: [facility];  Service: Gastroenterology;;   WISDOM TOOTH EXTRACTION      OB History     Gravida  1   Para  1   Term      Preterm      AB      Living  1      SAB      IAB      Ectopic      Multiple      Live Births               Home Medications    Prior to Admission medications   Medication Sig Start Date End Date Taking? Authorizing Provider  Cholecalciferol (VITAMIN D3) 125 MCG (5000 UT) CAPS Take 5,000 Units by mouth daily.   Yes [provider]  clotrimazole-betamethasone (LOTRISONE) cream Apply 1 application topically daily as needed (Eczema or inflamed in skull). 04/15/20  Yes [provider]   FLUoxetine (PROZAC) 20 MG capsule Take 20 mg by mouth daily 06/27/18  Yes Timmothy Euler, [place] D, PA-C  hydrocortisone 2.5 % ointment Apply 1 application topically 2 (two) times daily as needed (for eczema).   Yes [provider]  metoprolol succinate (TOPROL-XL) 50 MG 24 hr tablet Take 50 mg by mouth daily. 11/07/21  Yes [provider]  NON FORMULARY Take 1 tablet by mouth daily. Terrazyme Teacher, music) Digestive enzyme   Yes [provider]  OVER THE COUNTER MEDICATION Take 1 capsule by mouth daily. Doterra  Micro plex Vmz   Yes [provider]  OVER THE COUNTER MEDICATION Take 1 tablet by mouth daily. Doterra Dr-prime culture complex   Yes [provider]  trimethoprim-polymyxin b (POLYTRIM) ophthalmic solution Place 1 drop into both eyes every 6 (  six) hours for 7 days. 08/23/22 08/30/22 Yes Saphia Vanderford-Warren, Alda Lea, NP  Upadacitinib ER (RINVOQ) 15 MG TB24 Take one tablet by mouth once daily Patient taking differently: Take 15 mg by mouth daily. 04/11/22  Yes Armbruster, Carlota Raspberry, MD  Sodium Sulfate-Mag Sulfate-KCl (SUTAB) [phone] MG TABS Take 1 kit by mouth as directed. BIN: [id number] PCN: CN GROUP: [id number] MEMBER ID: [id number]; RUN AS CASH 03/29/22   Armbruster, Carlota Raspberry, MD  tranexamic acid (LYSTEDA) 650 MG TABS tablet Take 1,300 mg by mouth 3 (three) times daily as needed (FOR INTERNAL PROCEDURE TO STOP BLEEDING). "For internal procedure to stop bleeding 04/24/22   [provider]    Family History Family History  Problem Relation Age of Onset   Heart disease Mother    Stroke Mother    Diabetes Maternal Uncle    Heart disease Maternal Grandfather    Breast cancer Paternal Grandmother    Colon cancer Neg Hx    Esophageal cancer Neg Hx    Rectal cancer Neg Hx    Stomach cancer Neg Hx     Social History Social History   Tobacco Use   Smoking status: Never    Passive exposure: Never   Smokeless tobacco: Never  Vaping Use    Vaping Use: Never used  Substance Use Topics   Alcohol use: No    Alcohol/week: 0.0 standard drinks of alcohol   Drug use: No     Allergies   Atenolol, Nsaids, Sulfamethoxazole, Asa [aspirin], Cephalexin, Corn starch, Ibuprofen, Mercaptopurine, and Soy allergy   Review of Systems Review of Systems Per HPI  Physical Exam Triage Vital Signs ED Triage Vitals  Enc Vitals Group     BP 08/23/22 1709 111/75     Pulse Rate 08/23/22 1709 60     Resp 08/23/22 1709 16     Temp 08/23/22 1709 98.2 F (36.8 C)     Temp Source 08/23/22 1709 Oral     SpO2 08/23/22 1709 95 %     Weight --      Height --      Head Circumference --      Peak Flow --      Pain Score 08/23/22 1710 2     Pain Loc --      Pain Edu? --      Excl. in [place]? --    No data found.  Updated Vital Signs BP 111/75 (BP Location: Right Arm)   Pulse 60   Temp 98.2 F (36.8 C) (Oral)   Resp 16   LMP 11/26/2008   SpO2 95%   Visual Acuity Right Eye Distance:   Left Eye Distance:   Bilateral Distance:    Right Eye Near:   Left Eye Near:    Bilateral Near:     Physical Exam Vitals and nursing note reviewed.  Constitutional:      General: She is not in acute distress.    Appearance: Normal appearance.  HENT:     Head: Normocephalic.     Right Ear: Tympanic membrane, ear canal and external ear normal.     Left Ear: Tympanic membrane, ear canal and external ear normal.     Nose: Nose normal.     Mouth/Throat:     Mouth: Mucous membranes are moist.     Pharynx: Posterior oropharyngeal erythema present.  Eyes:     General: Lids are normal. Vision grossly intact. No visual field deficit.       Right eye: No  foreign body, discharge or hordeolum.        Left eye: Discharge present.No foreign body or hordeolum.     Extraocular Movements: Extraocular movements intact.     Right eye: Normal extraocular motion and no nystagmus.     Left eye: Normal extraocular motion and no nystagmus.     Conjunctiva/sclera:      Right eye: Right conjunctiva is injected.     Left eye: Left conjunctiva is injected.     Pupils: Pupils are equal, round, and reactive to light.  Cardiovascular:     Rate and Rhythm: Normal rate and regular rhythm.     Pulses: Normal pulses.     Heart sounds: Normal heart sounds.  Pulmonary:     Effort: Pulmonary effort is normal. No respiratory distress.     Breath sounds: Normal breath sounds. No stridor. No wheezing, rhonchi or rales.  Abdominal:     General: Bowel sounds are normal.     Palpations: Abdomen is soft.     Tenderness: There is no abdominal tenderness.  Musculoskeletal:     Cervical back: Normal range of motion.  Lymphadenopathy:     Cervical: No cervical adenopathy.  Skin:    General: Skin is warm and dry.  Neurological:     General: No focal deficit present.     Mental Status: She is alert and oriented to person, place, and time.  Psychiatric:        Mood and Affect: Mood normal.        Behavior: Behavior normal.      UC Treatments / Results  Labs (all labs ordered are listed, but only abnormal results are displayed) Labs Reviewed - No data to display  EKG   Radiology No results found.  Procedures Procedures (including critical care time)  Medications Ordered in UC Medications - No data to display  Initial Impression / Assessment and Plan / UC Course  I have reviewed the triage vital signs and the nursing notes.  Pertinent labs & imaging results that were available during my care of the patient were reviewed by me and considered in my medical decision making (see chart for details).  The patient is well-appearing, she is in no acute distress, vital signs are stable.  Symptoms are consistent with bilateral conjunctivitis.  Will start patient on trimethoprim-polymyxin B eyedrops for her symptoms.  Supportive care recommendations were provided to the patient including strict handwashing, cool compresses to the eyes, and use of  over-the-counter lubricant such as Clear Eyes or Visine.  Patient was advised to follow-up with her primary care physician if symptoms fail to improve.  Patient is stable for discharge.   Final Clinical Impressions(s) / UC Diagnoses   Final diagnoses:  Acute bacterial conjunctivitis of both eyes     Discharge Instructions      Use eyedrops as prescribed.   Cool compresses to the eyes to help with pain or swelling. May continue the over-the-counter eyedrops you are using to help keep the eyes moist and decreased redness. Strict handwashing when applying medication.  Avoid rubbing or manipulating the eyes while symptoms persist. Discard any eye make-up that were using prior to your symptoms starting. Follow-up with your primary care physician or with your eye doctor if symptoms fail to improve.     ED Prescriptions     Medication Sig Dispense Auth. Provider   trimethoprim-polymyxin b (POLYTRIM) ophthalmic solution Place 1 drop into both eyes every 6 (six) hours for 7 days. 10 mL  Quanetta Truss-Warren, Alda Lea, NP      PDMP not reviewed this encounter.   Tish Men, NP 08/23/22 1739

## 2022-08-23 NOTE — ED Triage Notes (Signed)
12/25 pt noticed her eye was red and itchy, then the next day had green mucus on both eyes when woke up.

## 2022-09-12 ENCOUNTER — Other Ambulatory Visit (HOSPITAL_COMMUNITY): Payer: Self-pay

## 2022-09-13 ENCOUNTER — Other Ambulatory Visit (HOSPITAL_COMMUNITY): Payer: Self-pay

## 2022-09-15 ENCOUNTER — Other Ambulatory Visit (HOSPITAL_COMMUNITY): Payer: Self-pay

## 2022-09-20 ENCOUNTER — Other Ambulatory Visit (HOSPITAL_COMMUNITY): Payer: Self-pay

## 2022-09-20 ENCOUNTER — Other Ambulatory Visit: Payer: Self-pay

## 2022-09-21 ENCOUNTER — Other Ambulatory Visit (HOSPITAL_COMMUNITY): Payer: Self-pay

## 2022-09-22 ENCOUNTER — Other Ambulatory Visit (HOSPITAL_COMMUNITY): Payer: Self-pay

## 2022-09-25 ENCOUNTER — Other Ambulatory Visit (HOSPITAL_COMMUNITY): Payer: Self-pay

## 2022-09-25 ENCOUNTER — Other Ambulatory Visit: Payer: Self-pay

## 2022-09-26 ENCOUNTER — Other Ambulatory Visit (HOSPITAL_COMMUNITY): Payer: Self-pay

## 2022-10-16 ENCOUNTER — Telehealth: Payer: Self-pay

## 2022-10-16 DIAGNOSIS — Z79899 Other long term (current) drug therapy: Secondary | ICD-10-CM

## 2022-10-16 DIAGNOSIS — D6800 Von Willebrand disease, unspecified: Secondary | ICD-10-CM

## 2022-10-16 DIAGNOSIS — K51019 Ulcerative (chronic) pancolitis with unspecified complications: Secondary | ICD-10-CM

## 2022-10-16 NOTE — Telephone Encounter (Signed)
MyChart message sent to patient with lab reminder. Lab orders in epic.

## 2022-10-16 NOTE — Telephone Encounter (Signed)
-----   Message from Yevette Edwards, RN sent at 04/13/2022  8:53 AM EDT ----- Regarding: Labs CBC, CMET - need to enter orders

## 2022-10-17 ENCOUNTER — Other Ambulatory Visit (HOSPITAL_COMMUNITY): Payer: Self-pay

## 2022-10-17 NOTE — Telephone Encounter (Signed)
Patient reviewed and responded to MyChart message. Last read by Chalmers Cater at  5:40 PM on 10/16/2022.

## 2022-10-18 ENCOUNTER — Other Ambulatory Visit: Payer: Self-pay

## 2022-10-18 ENCOUNTER — Other Ambulatory Visit (HOSPITAL_COMMUNITY): Payer: Self-pay

## 2022-10-18 ENCOUNTER — Other Ambulatory Visit: Payer: Self-pay | Admitting: Gastroenterology

## 2022-10-18 MED ORDER — RINVOQ 15 MG PO TB24
15.0000 mg | ORAL_TABLET | Freq: Every day | ORAL | 5 refills | Status: DC
  Filled 2022-10-18 – 2022-10-19 (×2): qty 30, 30d supply, fill #0
  Filled 2022-11-14: qty 30, 30d supply, fill #1
  Filled 2022-12-12: qty 30, 30d supply, fill #2

## 2022-10-19 ENCOUNTER — Other Ambulatory Visit (HOSPITAL_COMMUNITY): Payer: Self-pay

## 2022-10-20 ENCOUNTER — Other Ambulatory Visit (HOSPITAL_COMMUNITY): Payer: Self-pay

## 2022-10-23 ENCOUNTER — Other Ambulatory Visit (HOSPITAL_COMMUNITY): Payer: Self-pay

## 2022-10-26 ENCOUNTER — Other Ambulatory Visit (INDEPENDENT_AMBULATORY_CARE_PROVIDER_SITE_OTHER): Payer: BC Managed Care – PPO

## 2022-10-26 DIAGNOSIS — D6800 Von Willebrand disease, unspecified: Secondary | ICD-10-CM | POA: Diagnosis not present

## 2022-10-26 DIAGNOSIS — Z79899 Other long term (current) drug therapy: Secondary | ICD-10-CM

## 2022-10-26 DIAGNOSIS — K51019 Ulcerative (chronic) pancolitis with unspecified complications: Secondary | ICD-10-CM | POA: Diagnosis not present

## 2022-10-26 LAB — COMPREHENSIVE METABOLIC PANEL
ALT: 10 U/L (ref 0–35)
AST: 19 U/L (ref 0–37)
Albumin: 4.1 g/dL (ref 3.5–5.2)
Alkaline Phosphatase: 52 U/L (ref 39–117)
BUN: 20 mg/dL (ref 6–23)
CO2: 31 mEq/L (ref 19–32)
Calcium: 10.1 mg/dL (ref 8.4–10.5)
Chloride: 101 mEq/L (ref 96–112)
Creatinine, Ser: 0.77 mg/dL (ref 0.40–1.20)
GFR: 83.12 mL/min (ref >60.00)
Glucose, Bld: 87 mg/dL (ref 70–99)
Potassium: 4.8 mEq/L (ref 3.5–5.1)
Sodium: 139 mEq/L (ref 135–145)
Total Bilirubin: 0.4 mg/dL (ref 0.2–1.2)
Total Protein: 7.3 g/dL (ref 6.0–8.3)

## 2022-10-26 LAB — CBC WITH DIFFERENTIAL/PLATELET
Basophils Absolute: 0 10*3/uL (ref 0.0–0.1)
Basophils Relative: 0.5 % (ref 0.0–3.0)
Eosinophils Absolute: 0 10*3/uL (ref 0.0–0.7)
Eosinophils Relative: 0.6 % (ref 0.0–5.0)
HCT: 38.6 % (ref 36.0–46.0)
Hemoglobin: 12.8 g/dL (ref 12.0–15.0)
Lymphocytes Relative: 31.8 % (ref 12.0–46.0)
Lymphs Abs: 2 10*3/uL (ref 0.7–4.0)
MCHC: 33.2 g/dL (ref 30.0–36.0)
MCV: 90.3 fl (ref 78.0–100.0)
Monocytes Absolute: 0.7 10*3/uL (ref 0.1–1.0)
Monocytes Relative: 10.7 % (ref 3.0–12.0)
Neutro Abs: 3.6 10*3/uL (ref 1.4–7.7)
Neutrophils Relative %: 56.4 % (ref 43.0–77.0)
Platelets: 242 10*3/uL (ref 150.0–400.0)
RBC: 4.28 Mil/uL (ref 3.87–5.11)
RDW: 14.8 % (ref 11.5–15.5)
WBC: 6.4 10*3/uL (ref 4.0–10.5)

## 2022-10-26 NOTE — Telephone Encounter (Signed)
Called patient and rescheduled her F/U appointment for 4-16 at 4pm with Dr. Havery Moros

## 2022-11-01 ENCOUNTER — Ambulatory Visit (HOSPITAL_COMMUNITY)
Admission: RE | Admit: 2022-11-01 | Discharge: 2022-11-01 | Disposition: A | Discharge status: Home / Self Care | Payer: BC Managed Care – PPO | Source: Ambulatory Visit | Attending: Urology | Admitting: Urology

## 2022-11-01 DIAGNOSIS — R3129 Other microscopic hematuria: Secondary | ICD-10-CM | POA: Diagnosis not present

## 2022-11-01 DIAGNOSIS — D179 Benign lipomatous neoplasm, unspecified: Secondary | ICD-10-CM | POA: Diagnosis present

## 2022-11-08 ENCOUNTER — Encounter: Payer: Self-pay | Admitting: Urology

## 2022-11-08 ENCOUNTER — Ambulatory Visit (INDEPENDENT_AMBULATORY_CARE_PROVIDER_SITE_OTHER): Payer: BC Managed Care – PPO | Admitting: Urology

## 2022-11-08 ENCOUNTER — Telehealth: Payer: Self-pay

## 2022-11-08 VITALS — BP 130/81 | HR 52 | Ht 59.0 in | Wt 145.0 lb

## 2022-11-08 DIAGNOSIS — D179 Benign lipomatous neoplasm, unspecified: Secondary | ICD-10-CM | POA: Diagnosis not present

## 2022-11-08 NOTE — Patient Instructions (Signed)

## 2022-11-08 NOTE — Telephone Encounter (Signed)
-----   Message from Cleon Gustin, MD sent at 11/07/2022  9:40 AM EDT ----- US shows stable AMLs ----- Message ----- From: Iris Pert, LPN Sent: 04/30/8181   4:51 PM EDT To: Cleon Gustin, MD  F/u 3/13

## 2022-11-08 NOTE — Telephone Encounter (Signed)
Patient seen in office today. 

## 2022-11-08 NOTE — Progress Notes (Signed)
11/08/2022 2:08 PM   Lauren [name] February 09, 1961 [id number]  Referring provider: Bernerd Limbo, MD [address] [place],  [place] [address]  Followup left renal mass   HPI: Lauren [name] is a 62yo here for followup for left renal AMLs. RZenal US shows a new 31m left renal mass/AML. She notes over the past 1-2 weeks she has increased urinary urgency. She has colitis and currently having issues with her colitis.    PMH: Past Medical History:  Diagnosis Date   Allergy    Anemia    Anxiety    Cellulitis of left breast 09/17/2015   diagnosed at Urgent Care- on ABX   Clostridium difficile diarrhea    Clotting disorder ([facility]    Depression    takes Prozac daily   Hearing loss    right ear   History of colon polyps    benign   Hyperlipidemia    Hypertension    takes Metoprolol daily   Hypokalemia    takes Potassium every other day   Inflammatory polyps of colon (HCC)    Internal hemorrhoids    Mucosal abnormality of stomach    Osteopenia    Osteoporosis    Personal history of meningioma of the brain    Platelet disorder (HCC)    Pneumonia    hx of-about 4+ yrs ago   Seasonal allergies    takes Allegra as needed and Flonase daily   SVT (supraventricular tachycardia)    Tennis elbow    Ulcerative colitis    On Entyvio q 4 week dosing   Urinary urgency    Vitamin B12 deficiency    [place] disease ([facility]     Surgical History: Past Surgical History:  Procedure Laterality Date   BAHA REVISION Right 03/15/2016   Procedure: REVISION RIGHT BAHA HEARING IMPLANT ;  Surgeon: EVicie Mutters MD;  [facility]  Service: ENT;  Laterality: Right;   BIOPSY  01/24/2019   Procedure: BIOPSY;  Surgeon: AYetta Flock MD;  Location: [facility];  Service: Gastroenterology;;   BIOPSY  01/15/2020   Procedure: BIOPSY;  Surgeon: AYetta Flock MD;  Location: [facility];  Service: Gastroenterology;;   BIOPSY  03/22/2021   Procedure: BIOPSY;  Surgeon:  AYetta Flock MD;  Location: [facility];  Service: Gastroenterology;;   BIOPSY  05/04/2022   Procedure: BIOPSY;  Surgeon: AYetta Flock MD;  Location: [facility];  Service: Gastroenterology;;   BRAIN MENINGIOMA EXCISION  05/2007   CARDIAC ELECTROPHYSIOLOGY STUDY AND ABLATION  10/22/09   COLONOSCOPY N/A 07/29/2013   Procedure: COLONOSCOPY;  Surgeon: DLafayette Dragon MD;  Location: [facility];  Service: Endoscopy;  Laterality: N/A;   COLONOSCOPY WITH PROPOFOL N/A 09/21/2015   Procedure: COLONOSCOPY WITH PROPOFOL;  Surgeon: SManus Gunning MD;  Location: [facility];  Service: Gastroenterology;  Laterality: N/A;   COLONOSCOPY WITH PROPOFOL N/A 10/30/2017   Procedure: COLONOSCOPY WITH PROPOFOL;  Surgeon: AYetta Flock MD;  [facility];  Service: Gastroenterology;  Laterality: N/A;   COLONOSCOPY WITH PROPOFOL N/A 01/24/2019   Procedure: COLONOSCOPY WITH PROPOFOL;  Surgeon: AYetta Flock MD;  Location[facility];  Service: Gastroenterology;  Laterality: N/A;   COLONOSCOPY WITH PROPOFOL N/A 01/15/2020   Procedure: COLONOSCOPY WITH PROPOFOL;  Surgeon: AYetta Flock MD;  Location: [facility];  Service: Gastroenterology;  Laterality: N/A;  needs DDAVP 30-60 minutes prior to procedure.   COLONOSCOPY WITH PROPOFOL N/A 03/22/2021   Procedure: COLONOSCOPY WITH PROPOFOL;  Surgeon: Yetta Flock, MD;  Location[facility];  Service: Gastroenterology;  Laterality: N/A;   COLONOSCOPY WITH PROPOFOL N/A 05/04/2022   Procedure: COLONOSCOPY WITH PROPOFOL;  Surgeon: Yetta Flock, MD;  Location: [facility];  Service: Gastroenterology;  Laterality: N/A;   ESOPHAGOGASTRODUODENOSCOPY     HEMOSTASIS CLIP PLACEMENT  03/22/2021   Procedure: HEMOSTASIS CLIP PLACEMENT;  Surgeon: Yetta Flock, MD;  Location: [facility];  Service: Gastroenterology;;   IMPLANTATION BONE ANCHORED HEARING AID Right    POLYPECTOMY  01/24/2019   Procedure:  POLYPECTOMY;  Surgeon: Yetta Flock, MD;  Location: [facility];  Service: Gastroenterology;;   POLYPECTOMY  01/15/2020   Procedure: POLYPECTOMY;  Surgeon: Yetta Flock, MD;  Location[facility];  Service: Gastroenterology;;   POLYPECTOMY  03/22/2021   Procedure: POLYPECTOMY;  Surgeon: Yetta Flock, MD;  Location: [facility];  Service: Gastroenterology;;   WISDOM TOOTH EXTRACTION      Home Medications:  Allergies as of 11/08/2022       Reactions   Atenolol Swelling   Nsaids Other (See Comments)   PLATELET DISORDER  NSAIDS contraindicated with bleeding disorder. NSAIDS contraindicated with bleeding disorder.   Sulfamethoxazole Other (See Comments), Rash   "redness"   Asa [aspirin] Other (See Comments)   Platelet disorder   Cephalexin Other (See Comments)   Upset her colitis   Corn Starch Other (See Comments)   Gi-upset Allergy test   Ibuprofen Other (See Comments)   Platelet disrder   Mercaptopurine Hives, Itching   Elevated liver levels   Soy Allergy Other (See Comments)   GI symptoms         Medication List        Accurate as of November 08, 2022  2:08 PM. If you have any questions, ask your nurse or doctor.          clotrimazole-betamethasone cream Commonly known as: LOTRISONE Apply 1 application topically daily as needed (Eczema or inflamed in skull).   FLUoxetine 20 MG capsule Commonly known as: PROZAC Take 20 mg by mouth daily   hydrocortisone 2.5 % ointment Apply 1 application topically 2 (two) times daily as needed (for eczema).   metoprolol succinate 50 MG 24 hr tablet Commonly known as: TOPROL-XL Take 50 mg by mouth daily.   NON FORMULARY Take 1 tablet by mouth daily. Terrazyme (Essential Oil) Digestive enzyme   OVER THE COUNTER MEDICATION Take 1 capsule by mouth daily. Doterra  Micro plex Vmz   OVER THE COUNTER MEDICATION Take 1 tablet by mouth daily. Doterra Dr-prime culture complex   Rinvoq 15 MG  Tb24 Generic drug: Upadacitinib ER Take 1 tablet (15 mg total) by mouth daily.   Sutab 830 [id number] [id number] MG Tabs Generic drug: Sodium Sulfate-Mag Sulfate-KCl Take 1 kit by mouth as directed. BIN: [id number] PCN: CN GROUP: [id number] MEMBER ID: [id number]; RUN AS CASH   tranexamic acid 650 MG Tabs tablet Commonly known as: LYSTEDA Take 1,300 mg by mouth 3 (three) times daily as needed (FOR INTERNAL PROCEDURE TO STOP BLEEDING). "For internal procedure to stop bleeding   Vitamin D3 125 MCG (5000 UT) capsule Generic drug: Cholecalciferol Take 5,000 Units by mouth daily.        Allergies:  Allergies  Allergen Reactions   Atenolol Swelling   Nsaids Other (See Comments)    PLATELET DISORDER  NSAIDS contraindicated with bleeding disorder.  NSAIDS contraindicated with bleeding disorder.   Sulfamethoxazole Other (See Comments) and Rash    "redness"   Asa [Aspirin] Other (See  Comments)    Platelet disorder    Cephalexin Other (See Comments)    Upset her colitis    Corn Starch Other (See Comments)    Gi-upset  Allergy test   Ibuprofen Other (See Comments)    Platelet disrder   Mercaptopurine Hives and Itching    Elevated liver levels   Soy Allergy Other (See Comments)    GI symptoms     Family History: Family History  Problem Relation Age of Onset   Heart disease Mother    Stroke Mother    Diabetes Maternal Uncle    Heart disease Maternal Grandfather    Breast cancer Paternal Grandmother    Colon cancer Neg Hx    Esophageal cancer Neg Hx    Rectal cancer Neg Hx    Stomach cancer Neg Hx     Social History:  reports that she has never smoked. She has never been exposed to tobacco smoke. She has never used smokeless tobacco. She reports that she does not drink alcohol and does not use drugs.  ROS: All other review of systems were reviewed and are negative except what is noted above in HPI  Physical Exam: BP 130/81   Pulse (!) 52   Ht 4\' 11"  (1.499 m)   Wt 145 lb (65.8  kg)   LMP 11/26/2008   BMI 29.29 kg/m   Constitutional:  Alert and oriented, No acute distress. HEENT: [place] AT, moist mucus membranes.  Trachea midline, no masses. Cardiovascular: No clubbing, cyanosis, or edema. Respiratory: Normal respiratory effort, no increased work of breathing. GI: Abdomen is soft, nontender, nondistended, no abdominal masses GU: No CVA tenderness.  Lymph: No cervical or inguinal lymphadenopathy. Skin: No rashes, bruises or suspicious lesions. Neurologic: Grossly intact, no focal deficits, moving all 4 extremities. Psychiatric: Normal mood and affect.  Laboratory Data: Lab Results  Component Value Date   WBC 6.4 10/26/2022   HGB 12.8 10/26/2022   HCT 38.6 10/26/2022   MCV 90.3 10/26/2022   PLT 242.0 10/26/2022    Lab Results  Component Value Date   CREATININE 0.77 10/26/2022    No results found for: "PSA"  No results found for: "TESTOSTERONE"  Lab Results  Component Value Date   HGBA1C 5.7 09/03/2009    Urinalysis    Component Value Date/Time   COLORURINE LT. YELLOW 07/31/2013 0811   APPEARANCEUR Clear 11/16/2021 1425   LABSPEC 1.015 07/31/2013 0811   PHURINE 6.0 07/31/2013 0811   GLUCOSEU Negative 11/16/2021 1425   GLUCOSEU NEGATIVE 07/31/2013 0811   HGBUR Moderate 07/31/2013 0811   BILIRUBINUR Negative 11/16/2021 1425   KETONESUR negative 09/26/2016 1814   KETONESUR NEGATIVE 07/31/2013 0811   PROTEINUR Negative 11/16/2021 1425   PROTEINUR NEGATIVE 06/24/2007 1621   UROBILINOGEN 0.2 09/26/2016 1814   UROBILINOGEN 0.2 07/31/2013 0811   NITRITE Negative 11/16/2021 1425   NITRITE NEGATIVE 07/31/2013 0811   LEUKOCYTESUR Negative 11/16/2021 1425    Lab Results  Component Value Date   LABMICR See below: 11/16/2021   WBCUA None seen 11/16/2021   LABEPIT None seen 11/16/2021   MUCUS neg 12/31/2014   BACTERIA None seen 11/16/2021    Pertinent Imaging: Renal US 11/02/2022: Images reviewed and discussed with the patient  No results  found for this or any previous visit.  No results found for this or any previous visit.  No results found for this or any previous visit.  No results found for this or any previous visit.  Results for orders placed during the  hospital encounter of 11/01/22  Ultrasound renal complete  Narrative CLINICAL DATA:  Hematuria.  History of angio myelolipomas.  EXAM: RENAL / URINARY TRACT ULTRASOUND COMPLETE  COMPARISON:  Renal ultrasound in a November 09, 2021. CT scan of the abdomen and pelvis May 12, 2022.  FINDINGS: Right Kidney:  Renal measurements: 11.2 x 4.3 x 5.2 cm = volume: 130 mL. Echogenicity within normal limits. No mass or hydronephrosis visualized.  Left Kidney:  Renal measurements: 10.3 x 5.2 x 5.3 cm = volume: 148 mL. An echogenic mass in the medial lower pole measures 7 x 9 x 8 mm today versus 7 x 7 x 7 mm on the November 09, 2021 study. The small difference in measurement may be technical.  The second mass in the lower pole measures 5 by 6 x 8 mm today versus 5 x 5 x 4 mm previously.  A new hyperechoic mass is identified in the medial upper pole measuring 4 x 5 x 5 mm today, not visualized previously.  Bladder:  Appears normal for degree of bladder distention.  Other:  None.  IMPRESSION: 1. The 2 largest masses in the left kidney correspond to fat containing masses on a CT scan dated May 13, 2019 consistent with angiomyelolipomas. The small differences in measurement could be technical. Recommend continued attention on follow-up. 2. The new mass in the medial upper pole measuring up to 5 mm is also hyperechoic. While this may represent an angio myelolipoma given appearance, it was not visualized on previous imaging. An MRI of the abdomen with and without contrast could definitively characterize this mass. Alternatively, a follow-up ultrasound in 6 months could ensure stability. 3. The right kidney is normal. 4. The bladder is  unremarkable.   Electronically Signed By: Dorise Bullion III M.D. On: 11/02/2022 10:51  No valid procedures specified. No results found for this or any previous visit.  No results found for this or any previous visit.   Assessment & Plan:    1. Angiomyolipoma -Followup 6 months with a renal US - Urinalysis, Routine w reflex microscopic  2. Urinary urgency -We will defer therapy at this time   No follow-ups on file.  Nicolette Bang, MD  [facility] [place]

## 2022-11-09 LAB — URINALYSIS, ROUTINE W REFLEX MICROSCOPIC
Bilirubin, UA: NEGATIVE
Glucose, UA: NEGATIVE
Ketones, UA: NEGATIVE
Nitrite, UA: NEGATIVE
Protein,UA: NEGATIVE
Specific Gravity, UA: 1.01 (ref 1.005–1.030)
Urobilinogen, Ur: 0.2 mg/dL (ref 0.2–1.0)
pH, UA: 5.5 (ref 5.0–7.5)

## 2022-11-09 LAB — MICROSCOPIC EXAMINATION: Bacteria, UA: NONE SEEN

## 2022-11-14 ENCOUNTER — Other Ambulatory Visit (HOSPITAL_COMMUNITY): Payer: Self-pay

## 2022-11-20 ENCOUNTER — Other Ambulatory Visit: Payer: Self-pay

## 2022-11-22 ENCOUNTER — Other Ambulatory Visit (HOSPITAL_COMMUNITY): Payer: Self-pay

## 2022-12-12 ENCOUNTER — Ambulatory Visit (INDEPENDENT_AMBULATORY_CARE_PROVIDER_SITE_OTHER): Payer: BC Managed Care – PPO | Admitting: Gastroenterology

## 2022-12-12 ENCOUNTER — Other Ambulatory Visit (HOSPITAL_COMMUNITY): Payer: Self-pay

## 2022-12-12 ENCOUNTER — Other Ambulatory Visit: Payer: Self-pay

## 2022-12-12 ENCOUNTER — Other Ambulatory Visit: Payer: BC Managed Care – PPO

## 2022-12-12 ENCOUNTER — Encounter: Payer: Self-pay | Admitting: Gastroenterology

## 2022-12-12 VITALS — BP 122/80 | HR 77 | Ht 59.0 in | Wt 159.0 lb

## 2022-12-12 DIAGNOSIS — Z79899 Other long term (current) drug therapy: Secondary | ICD-10-CM | POA: Diagnosis not present

## 2022-12-12 DIAGNOSIS — K5100 Ulcerative (chronic) pancolitis without complications: Secondary | ICD-10-CM

## 2022-12-12 MED ORDER — RINVOQ 30 MG PO TB24
30.0000 mg | ORAL_TABLET | Freq: Every day | ORAL | 3 refills | Status: DC
  Filled 2022-12-12: qty 30, 30d supply, fill #0
  Filled 2023-01-12: qty 30, 30d supply, fill #1
  Filled 2023-02-08: qty 30, 30d supply, fill #2
  Filled 2023-03-06 (×2): qty 30, 30d supply, fill #3

## 2022-12-12 NOTE — Progress Notes (Signed)
HPI :  Colitis History Initially left sided ulcerative colitis, diagnosed > 15 years ago. On Remicade in the past for her colitis, which she thinks she did well for a few years. She had a severe MRSA infection of her ear and ultimately Remicade was held and eventually was stopped given she had done well when it was held. She has never been on methotrexate. She has been on Lialda for a long time, prior to that Asacol. She has Von Willebrands. She bleeds easily but has never had significant bleeding / hemorrhages in the past. She has had DDAVP infusions prior to her last colonoscopy or Stilmate nose spray.  Started on [facility] April 2018 following a few flares that Spring. Failed Entyvio 2019, started Humira and Feb / March 2020. Failed Humira / 6 MP combination therapy 12/2018. Hepatoxicity to thiopurines - levels > 50K. Transitioned to Stelara monotherapy 02/2019. Now PANCOLONIC colitis. Moderately active disease on every 4 weeks dosing of Stelara so it was also stopped summer 2022. Noted to have pancolitis at that time. Started Rinvoq [name] 2023.     SINCE LAST VISIT:   62 year old female here for follow-up visit for her ulcerative colitis. Recall she has failed numerous regimens as outlined above.  Has been on Rinvoq since January 2023, on maintenance dosing of 15 mg daily.  She generally has been doing really well since has been on this regimen.  Surveillance colonoscopy with me this past September showed that she was in remission and biopsies were normal, no active inflammation.  She has not had a fecal calprotectin since being on the Rinvoq.  She states she has been on baseline in regards to her symptoms.  She states she traveled at the end of January and was eating food she normally does not eat and did not tolerate them too well and was having some increased gas and loose stools.  She states in the past few months she has had some intermittent mild symptoms with some loose stools.  Perhaps  with some mucus and scant blood she has attributed to hemorrhoids.  No abdominal pains no abdominal spasms.  Not much of any urgency.  She has not been on any antibiotics or had any sick contacts recently.  She is tolerating the Rinvoq well, no side effects.  She has had a shingles vaccine as outlined below, as well as pneumococcal vaccine.  She is seeing her primary care in the near future for yearly physical.  Her lipid panel on regimen last year looked okay.  She is due to have that in the next month.  She had labs with a CBC and CMET done on February 29.  Those labs are normal, no anemia.  She is generally feeling well today     IBD Health Care Maintenance: Annual Flu Vaccine - Date due: 2022 UTD Pneumococcal Vaccine if receiving immunosuppression: - Date PCV 13 - 05/21/2018, PPSV 23 05/01/19 Zoster vaccine 08/15/19, 05/17/19 COVID VACCINE UTD TB testing if on anti-TNF, yearly - negative 06/2021 Vitamin D screening - Date 11/08/20 - level of 49.18 Last Colonoscopy :   Colonoscopy 03/22/21 - The examined portion of the ileum was normal. - Moderately active (Mayo Score 2) ulcerative colitis in the rectum / rectosigmoid colon. Biopsied. - Mild (Mayo Score 1) ulcerative colitis thoughout elsewhere. Biopsied. - One 6 mm polyp in the distal transverse colon, removed with a hot snare. Resected and retrieved. Clips were placed. - One 2 to 3 mm polyp at the  recto-sigmoid colon, removed with a cold biopsy forceps. Resected and retrieved. - The examination was otherwise normal. Unfortunately active inflammation despite Stelara q 4 weeks dosing. Will discuss options with the patient.     FINAL MICROSCOPIC DIAGNOSIS:   A. COLON, RIGHT, BIOPSY:  - Chronic moderately active colitis.  - No dysplasia or malignancy.   B. COLON, TRANSVERSE, BIOPSY:  - Chronic moderately active colitis.  - No dysplasia or malignancy.   C. COLON, TRANSVERSE, POLYPECTOMY:  - Inflammatory pseudopolyp.  - No dysplasia  or malignancy.   D. COLON, LEFT, BIOPSY:  - Chronic mildly active colitis.  - No dysplasia or malignancy.   E. COLON, RECTOSIGMOID, RECTUM, BIOPSY:  - Chronic moderately active colitis.  - No dysplasia or malignancy.   F. RECTUM, POLYPECTOMY:  - Inflammatory pseudopolyp.  - No dysplasia or malignancy.      Colonoscopy 01/15/20 - The examined portion of the ileum was normal. - One 3 to 4 mm polyp in the sigmoid colon, removed with a cold snare. Resected and retrieved. - One 3 mm polyp at the recto-sigmoid colon, removed with a cold biopsy forceps. Resected and retrieved. - Mild (Mayo Score 1) ulcerative colitis of the distal 20cm, otherwise no other inflammatory changes noted. Overall, significantly improved since the last examination. Biopsied. - Biopsies for surveillance were taken from the right colon, left colon, transverse colon, and Rectum.    Colonoscopy 01/24/2019 - severe left sided inflammation, mild in transverse and right colon, normal ileum. Benign inflammatory polyp of the cecum   Colonoscopy 2014 - active rectal inflammation, inflammatory polyp Colonoscopy 09/21/2015 - overall good control of colitis with small mild area of inflammation in left colon, mild chronic colitis Colonoscopy 10/30/2017 - normal ileum, 10-15cm segment of mild inflammation, otherwise normal - mildly active colitis     Fecal calprotectin 90 on 04/29/21    Colonoscopy 05/04/2022: - The examined portion of the ileum was normal. - Internal hemorrhoids. - The examination was otherwise normal. - Biopsies were taken with a cold forceps for histology in the rectum, in the sigmoid colon, in the descending colon, in the transverse colon and in the ascending colon. Interval healing of colonic mucosa on Rinvoq, endoscopically appears to be in remission.  FINAL MICROSCOPIC DIAGNOSIS:   A. RIGHT COLON, BIOPSY:  - Chronic inactive colitis  - Negative for granulomas, viral cytopathic effect, dysplasia or   malignancy   B. TRANSVERSE COLON, BIOPSY:  - Chronic inactive colitis  - Negative for granulomas, viral cytopathic effect, dysplasia or  malignancy   C. LEFT COLON, BIOPSY:  - Chronic inactive colitis with Paneth cell metaplasia  - Negative for granulomas, viral cytopathic effect, dysplasia or  malignancy   D. RECTAL COLON, BIOPSY:  - Chronic inactive colitis with Paneth cell metaplasia  - Negative for granulomas, viral cytopathic effect, dysplasia or  malignancy   Past Medical History:  Diagnosis Date   Allergy    Anemia    Anxiety    Cellulitis of left breast 09/17/2015   diagnosed at Urgent Care- on ABX   Clostridium difficile diarrhea    Clotting disorder    Depression    takes Prozac daily   Hearing loss    right ear   History of colon polyps    benign   Hyperlipidemia    Hypertension    takes Metoprolol daily   Hypokalemia    takes Potassium every other day   Inflammatory polyps of colon    Internal hemorrhoids  Mucosal abnormality of stomach    Osteopenia    Osteoporosis    Personal history of meningioma of the brain    Platelet disorder    Pneumonia    hx of-about 4+ yrs ago   Seasonal allergies    takes Allegra as needed and Flonase daily   SVT (supraventricular tachycardia)    Tennis elbow    Ulcerative colitis    On Entyvio q 4 week dosing   Urinary urgency    Vitamin B12 deficiency    Von Willebrands disease      Past Surgical History:  Procedure Laterality Date   BAHA REVISION Right 03/15/2016   Procedure: REVISION RIGHT BAHA HEARING IMPLANT ;  Surgeon: Ermalinda Barrios, MD;  Location: [facility] OR;  Service: ENT;  Laterality: Right;   BIOPSY  01/24/2019   Procedure: BIOPSY;  Surgeon: Benancio Deeds, MD;  Location: [facility];  Service: Gastroenterology;;   BIOPSY  01/15/2020   Procedure: BIOPSY;  Surgeon: Benancio Deeds, MD;  Location: [facility];  Service: Gastroenterology;;   BIOPSY  03/22/2021   Procedure: BIOPSY;  Surgeon:  Benancio Deeds, MD;  Location: [facility];  Service: Gastroenterology;;   BIOPSY  05/04/2022   Procedure: BIOPSY;  Surgeon: Benancio Deeds, MD;  Location: [facility];  Service: Gastroenterology;;   BRAIN MENINGIOMA EXCISION  05/2007   CARDIAC ELECTROPHYSIOLOGY STUDY AND ABLATION  10/22/09   COLONOSCOPY N/A 07/29/2013   Procedure: COLONOSCOPY;  Surgeon: Hart Carwin, MD;  Location: [facility];  Service: Endoscopy;  Laterality: N/A;   COLONOSCOPY WITH PROPOFOL N/A 09/21/2015   Procedure: COLONOSCOPY WITH PROPOFOL;  Surgeon: Ruffin Frederick, MD;  Location: [facility];  Service: Gastroenterology;  Laterality: N/A;   COLONOSCOPY WITH PROPOFOL N/A 10/30/2017   Procedure: COLONOSCOPY WITH PROPOFOL;  Surgeon: Benancio Deeds, MD;  Location: [facility];  Service: Gastroenterology;  Laterality: N/A;   COLONOSCOPY WITH PROPOFOL N/A 01/24/2019   Procedure: COLONOSCOPY WITH PROPOFOL;  Surgeon: Benancio Deeds, MD;  Location: [facility];  Service: Gastroenterology;  Laterality: N/A;   COLONOSCOPY WITH PROPOFOL N/A 01/15/2020   Procedure: COLONOSCOPY WITH PROPOFOL;  Surgeon: Benancio Deeds, MD;  Location: [facility];  Service: Gastroenterology;  Laterality: N/A;  needs DDAVP 30-60 minutes prior to procedure.   COLONOSCOPY WITH PROPOFOL N/A 03/22/2021   Procedure: COLONOSCOPY WITH PROPOFOL;  Surgeon: Benancio Deeds, MD;  Location: [facility];  Service: Gastroenterology;  Laterality: N/A;   COLONOSCOPY WITH PROPOFOL N/A 05/04/2022   Procedure: COLONOSCOPY WITH PROPOFOL;  Surgeon: Benancio Deeds, MD;  Location: [facility];  Service: Gastroenterology;  Laterality: N/A;   ESOPHAGOGASTRODUODENOSCOPY     HEMOSTASIS CLIP PLACEMENT  03/22/2021   Procedure: HEMOSTASIS CLIP PLACEMENT;  Surgeon: Benancio Deeds, MD;  Location: [facility];  Service: Gastroenterology;;   IMPLANTATION BONE ANCHORED HEARING AID Right    POLYPECTOMY  01/24/2019   Procedure:  POLYPECTOMY;  Surgeon: Benancio Deeds, MD;  Location: [facility];  Service: Gastroenterology;;   POLYPECTOMY  01/15/2020   Procedure: POLYPECTOMY;  Surgeon: Benancio Deeds, MD;  Location: [facility];  Service: Gastroenterology;;   POLYPECTOMY  03/22/2021   Procedure: POLYPECTOMY;  Surgeon: Benancio Deeds, MD;  Location: [facility];  Service: Gastroenterology;;   WISDOM TOOTH EXTRACTION     Family History  Problem Relation Age of Onset   Heart disease Mother    Stroke Mother    Diabetes Maternal Uncle    Heart disease Maternal Grandfather    Breast cancer Paternal  Grandmother    Colon cancer Neg Hx    Esophageal cancer Neg Hx    Rectal cancer Neg Hx    Stomach cancer Neg Hx    Social History   Tobacco Use   Smoking status: Never    Passive exposure: Never   Smokeless tobacco: Never  Vaping Use   Vaping Use: Never used  Substance Use Topics   Alcohol use: No    Alcohol/week: 0.0 standard drinks of alcohol   Drug use: No   Current Outpatient Medications  Medication Sig Dispense Refill   Cholecalciferol (VITAMIN D3) 125 MCG (5000 UT) CAPS Take 5,000 Units by mouth daily.     clotrimazole-betamethasone (LOTRISONE) cream Apply 1 application topically daily as needed (Eczema or inflamed in skull).     FLUoxetine (PROZAC) 20 MG capsule Take 20 mg by mouth daily 90 capsule 0   hydrocortisone 2.5 % ointment Apply 1 application topically 2 (two) times daily as needed (for eczema).     metoprolol succinate (TOPROL-XL) 50 MG 24 hr tablet Take 50 mg by mouth daily.     NON FORMULARY Take 1 tablet by mouth daily. Terrazyme (Essential Oil) Digestive enzyme     OVER THE COUNTER MEDICATION Take 1 capsule by mouth daily. Doterra  Micro plex Vmz     OVER THE COUNTER MEDICATION Take 1 tablet by mouth daily. Doterra Dr-prime culture complex     tranexamic acid (LYSTEDA) 650 MG TABS tablet Take 1,300 mg by mouth 3 (three) times daily as needed (FOR INTERNAL PROCEDURE TO  STOP BLEEDING). "For internal procedure to stop bleeding     Upadacitinib ER (RINVOQ) 15 MG TB24 Take 1 tablet (15 mg total) by mouth daily. 30 tablet 5   Current Facility-Administered Medications  Medication Dose Route Frequency Provider Last Rate Last Admin   desmopressin (DDAVP) 23.6 mcg in sodium chloride 0.9 % IV Syringe  0.3 mcg/kg Intravenous Once Gladyse Corvin, Willaim Rayas, MD       Allergies  Allergen Reactions   Atenolol Swelling   Nsaids Other (See Comments)    PLATELET DISORDER  NSAIDS contraindicated with bleeding disorder.  NSAIDS contraindicated with bleeding disorder.   Sulfamethoxazole Other (See Comments) and Rash    "redness"   Asa [Aspirin] Other (See Comments)    Platelet disorder    Cephalexin Other (See Comments)    Upset her colitis    Corn Starch Other (See Comments)    Gi-upset  Allergy test   Ibuprofen Other (See Comments)    Platelet disrder   Mercaptopurine Hives and Itching    Elevated liver levels   Soy Allergy Other (See Comments)    GI symptoms      Review of Systems: All systems reviewed and negative except where noted in HPI.   Lab Results  Component Value Date   WBC 6.4 10/26/2022   HGB 12.8 10/26/2022   HCT 38.6 10/26/2022   MCV 90.3 10/26/2022   PLT 242.0 10/26/2022    Lab Results  Component Value Date   CREATININE 0.77 10/26/2022   BUN 20 10/26/2022   NA 139 10/26/2022   K 4.8 10/26/2022   CL 101 10/26/2022   CO2 31 10/26/2022    Lab Results  Component Value Date   ALT 10 10/26/2022   AST 19 10/26/2022   ALKPHOS 52 10/26/2022   BILITOT 0.4 10/26/2022     Physical Exam: BP 122/80   Pulse 77   Ht 4\' 11"  (1.499 m)   Wt 159 lb (  72.1 kg)   LMP 11/26/2008   BMI 32.11 kg/m  Constitutional: Pleasant,well-developed, female in no acute distress. Neurological: Alert and oriented to person place and time. Skin: Skin is warm and dry. No rashes noted. Psychiatric: Normal mood and affect. Behavior is  normal.   ASSESSMENT: 62 y.o. female here for assessment of the following  1. Ulcerative pancolitis without complication   2. High risk medication use    As above, difficult to control ulcerative pancolitis.  She has failed numerous regimens as outlined, she has been most recently on Rinvoq since January of last year, now on it over a year.  Clinically she has done really well with this and colonoscopy last September showed she was in deep remission.  She has had an excellent response.  We have kept her on 15 mg daily.  She traveled in recent months, ate some food she normally does not eat, had some recurrent loose stools with some milder symptoms in recent weeks.  Unclear if this is just dietary indiscretion or due to her colitis which may be mildly active.  We discussed options.  Given she has failed numerous regimens in the past, stakes are high if she fails another regimen.  We discussed risks of the medication regimen versus benefits.  I think she would benefit from a maintenance dosing increased to 30 mg daily in hopes of providing best duration of response.  She is agreeable to this.  We will increase her dose at the pharmacy hopefully she can pick that up this week.  I will also check a fecal calprotectin to get baseline now to see if she has any active inflammation and will need to trend this over time.  If she has any worsening symptoms despite this, fevers, intolerance of the medicines etc. otherwise, she will let me know.  She is having primary care physical in the next month, likely having lipids at that time and other labs.  Most recently no anemia etc. which is reassuring.   PLAN: - increase Rinvoq to 30mg  / day for maintenance after discussion of options as above - lab for fecal calprotectin - Labs UTD, having physical soon, they will check lipids - call with any flare or symptoms - f/u 6 months or sooner with issues    Harlin Rain, MD [facility] Gastroenterology

## 2022-12-12 NOTE — Patient Instructions (Addendum)
Increase RINVOQ to 30 MG once daily. [facility] has cancelled the script for 15 mg and will let you know when you can pick up the 30 mg prescription.  Please go to the lab in the basement of our building to have fecal calprotectin study done as you leave today. Hit "B" for basement when you get on the elevator.  When the doors open the lab is on your left.  We will call you with the results. Thank you.  Please follow up in 6 months.  Thank you for entrusting me with your care and for choosing [facility], Dr. Ileene Patrick     If your blood pressure at your visit was 140/90 or greater, please contact your primary care physician to follow up on this.  _______________________________________________________  If you are age 26 or older, your body mass index should be between 23-30. Your Body mass index is 32.11 kg/m. If this is out of the aforementioned range listed, please consider follow up with your Primary Care Provider.  If you are age 96 or younger, your body mass index should be between 19-25. Your Body mass index is 32.11 kg/m. If this is out of the aformentioned range listed, please consider follow up with your Primary Care Provider.   ________________________________________________________  The [facility] providers would like to encourage you to use [facility] to communicate with providers for non-urgent requests or questions.  Due to long hold times on the telephone, sending your provider a message by [facility] may be a faster and more efficient way to get a response.  Please allow 48 business hours for a response.  Please remember that this is for non-urgent requests.  _______________________________________________________  Due to recent changes in healthcare laws, you may see the results of your imaging and laboratory studies on MyChart before your provider has had a chance to review them.  We understand that in some cases there may be results that are  confusing or concerning to you. Not all laboratory results come back in the same time frame and the provider may be waiting for multiple results in order to interpret others.  Please give [place] 48 hours in order for your provider to thoroughly review all the results before contacting the office for clarification of your results.

## 2022-12-12 NOTE — Progress Notes (Signed)
Increased to 30 mg once daily.  Called [facility]. They cancelled script for 15 mg and confirmed that [facility] is working on 30 mg script and will let patient know when she can pick it up

## 2022-12-14 ENCOUNTER — Other Ambulatory Visit: Payer: Self-pay

## 2022-12-14 ENCOUNTER — Other Ambulatory Visit: Payer: BC Managed Care – PPO

## 2022-12-14 DIAGNOSIS — Z79899 Other long term (current) drug therapy: Secondary | ICD-10-CM

## 2022-12-14 DIAGNOSIS — K5100 Ulcerative (chronic) pancolitis without complications: Secondary | ICD-10-CM

## 2022-12-18 ENCOUNTER — Ambulatory Visit: Payer: BC Managed Care – PPO | Admitting: Gastroenterology

## 2022-12-18 ENCOUNTER — Other Ambulatory Visit (HOSPITAL_COMMUNITY): Payer: Self-pay

## 2022-12-20 LAB — CALPROTECTIN, FECAL: Calprotectin, Fecal: 668 ug/g — ABNORMAL HIGH (ref 0–120)

## 2022-12-22 ENCOUNTER — Encounter: Payer: Self-pay | Admitting: Gastroenterology

## 2023-01-11 ENCOUNTER — Other Ambulatory Visit (HOSPITAL_COMMUNITY): Payer: Self-pay

## 2023-01-12 ENCOUNTER — Other Ambulatory Visit (HOSPITAL_COMMUNITY): Payer: Self-pay

## 2023-01-15 ENCOUNTER — Other Ambulatory Visit: Payer: Self-pay

## 2023-02-08 ENCOUNTER — Other Ambulatory Visit (HOSPITAL_COMMUNITY): Payer: Self-pay

## 2023-02-14 ENCOUNTER — Other Ambulatory Visit (HOSPITAL_COMMUNITY): Payer: Self-pay

## 2023-02-21 ENCOUNTER — Other Ambulatory Visit: Payer: Self-pay | Admitting: *Deleted

## 2023-02-21 ENCOUNTER — Telehealth: Payer: Self-pay | Admitting: *Deleted

## 2023-02-21 DIAGNOSIS — K51019 Ulcerative (chronic) pancolitis with unspecified complications: Secondary | ICD-10-CM

## 2023-02-21 NOTE — Telephone Encounter (Signed)
-----   Message from Chrystie Nose, RN sent at 02/21/2023  7:42 AM EDT ----- Regarding: FW: Fecal calprotectin  ----- Message ----- From: Missy Sabins, RN Sent: 02/21/2023  12:00 AM EDT To: Missy Sabins, RN Subject: Fecal calprotectin                             Fecal Calprotectin - need to enter order

## 2023-02-21 NOTE — Telephone Encounter (Signed)
Patient called to inform to come to the lab to obtain the stool test for the Fecal Protectin. Patient agreed.

## 2023-03-06 ENCOUNTER — Other Ambulatory Visit: Payer: Self-pay

## 2023-03-06 ENCOUNTER — Other Ambulatory Visit (HOSPITAL_COMMUNITY): Payer: Self-pay

## 2023-03-07 ENCOUNTER — Other Ambulatory Visit (HOSPITAL_COMMUNITY): Payer: Self-pay

## 2023-03-08 ENCOUNTER — Other Ambulatory Visit (HOSPITAL_COMMUNITY): Payer: Self-pay

## 2023-03-19 IMAGING — MG MM DIGITAL SCREENING BILAT W/ TOMO AND CAD
8 series · 9 of 24 positions shown · non-contrast
Comparison: Previous exam(s).

CLINICAL DATA: Screening.

EXAM:
DIGITAL SCREENING BILATERAL MAMMOGRAM WITH TOMOSYNTHESIS AND CAD
TECHNIQUE: Bilateral screening digital craniocaudal and mediolateral oblique
mammograms were obtained. Bilateral screening digital breast
tomosynthesis was performed. The images were evaluated with
computer-aided detection.

[R CC synth-2D]
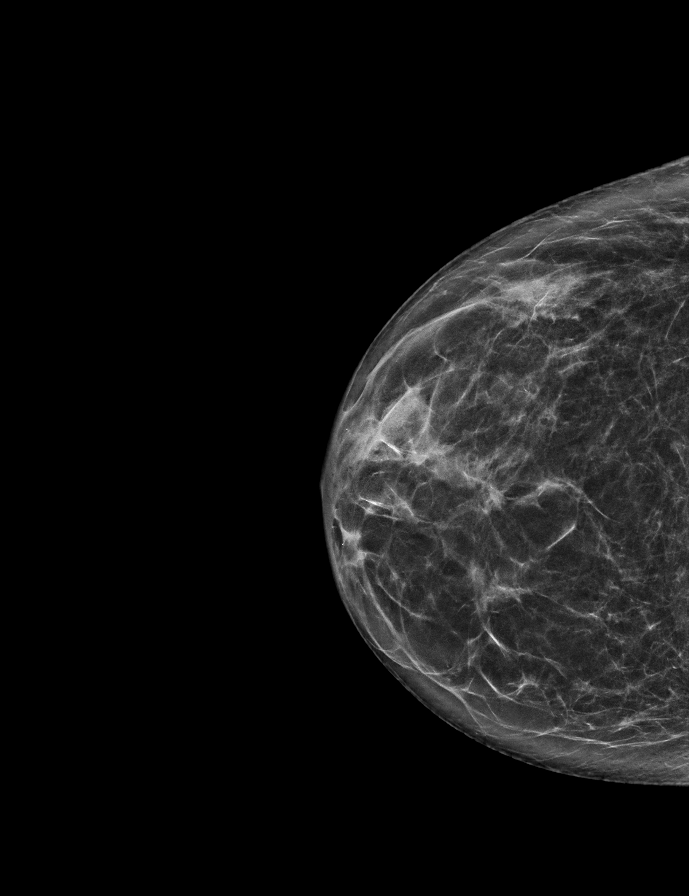

[L CC synth-2D]
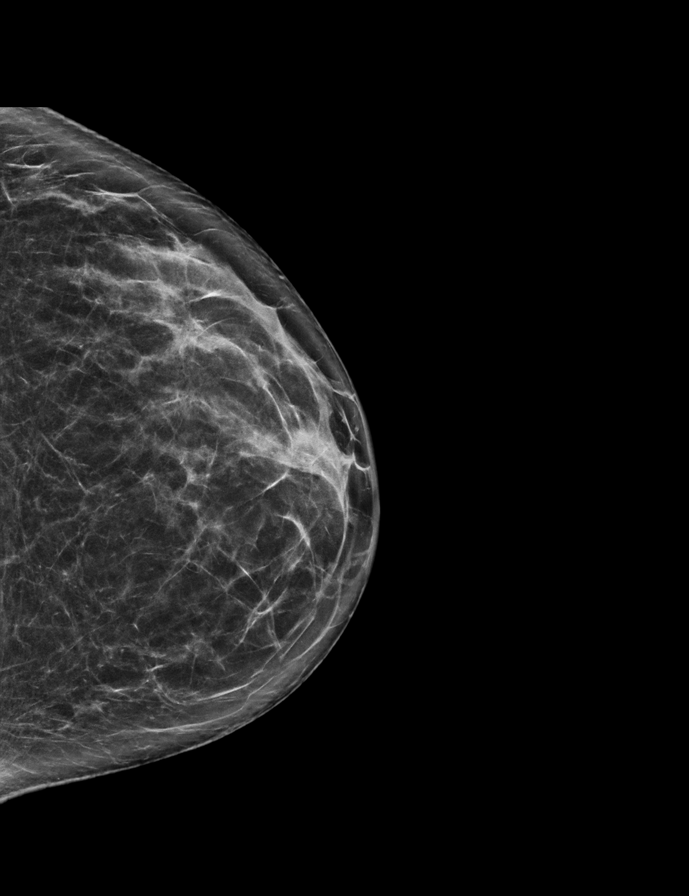

[L MLO synth-2D]
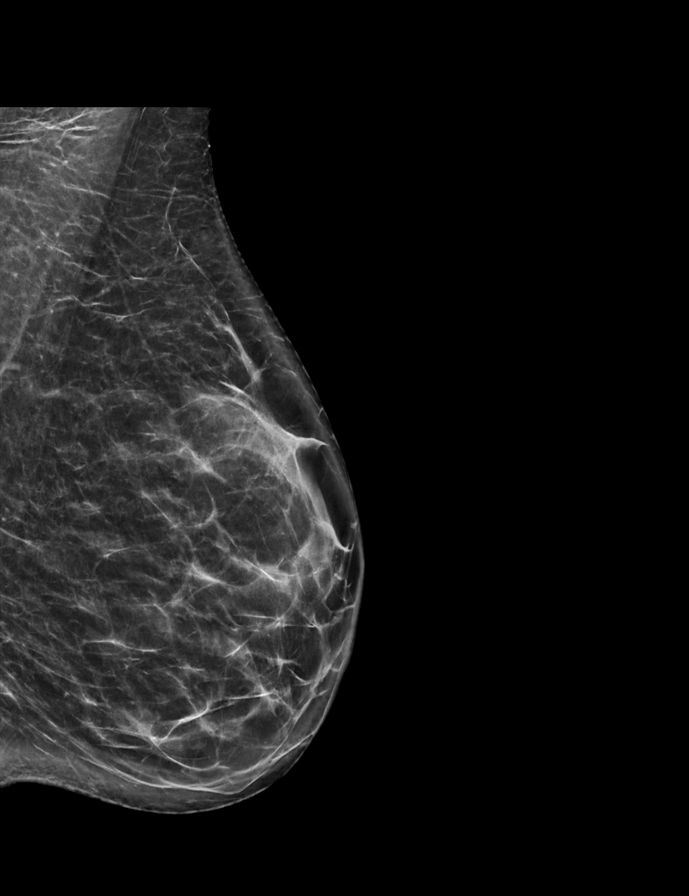

[R MLO synth-2D]
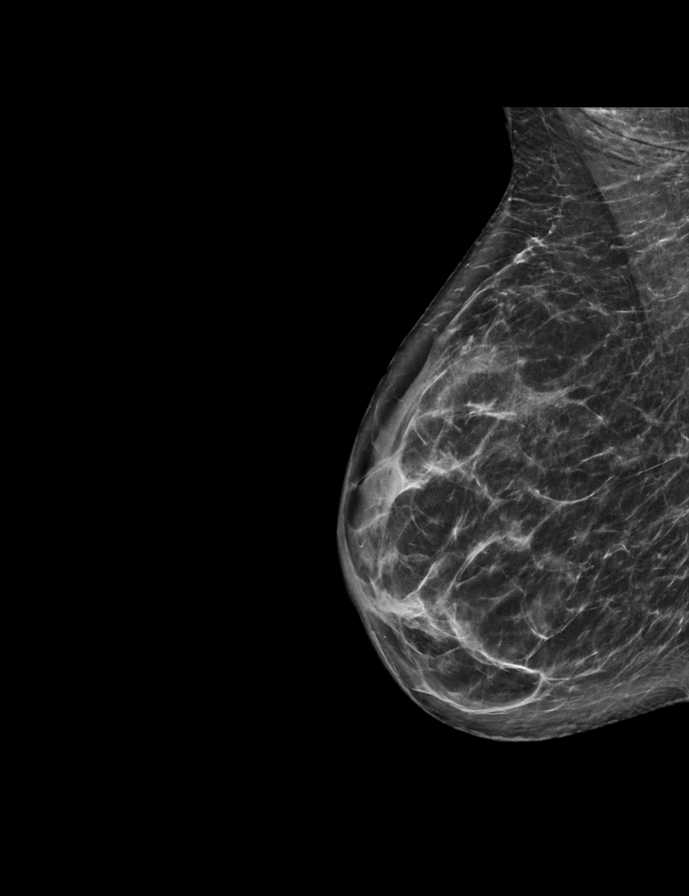

[R CC tomo · 2 of 59 frames shown]
[frame 20/59]
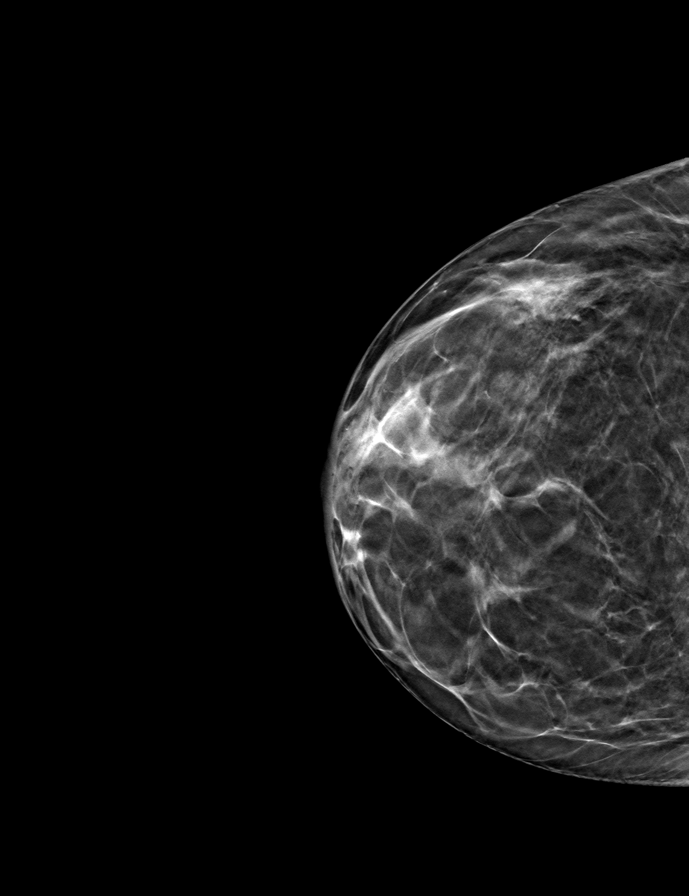
[frame 30/59]
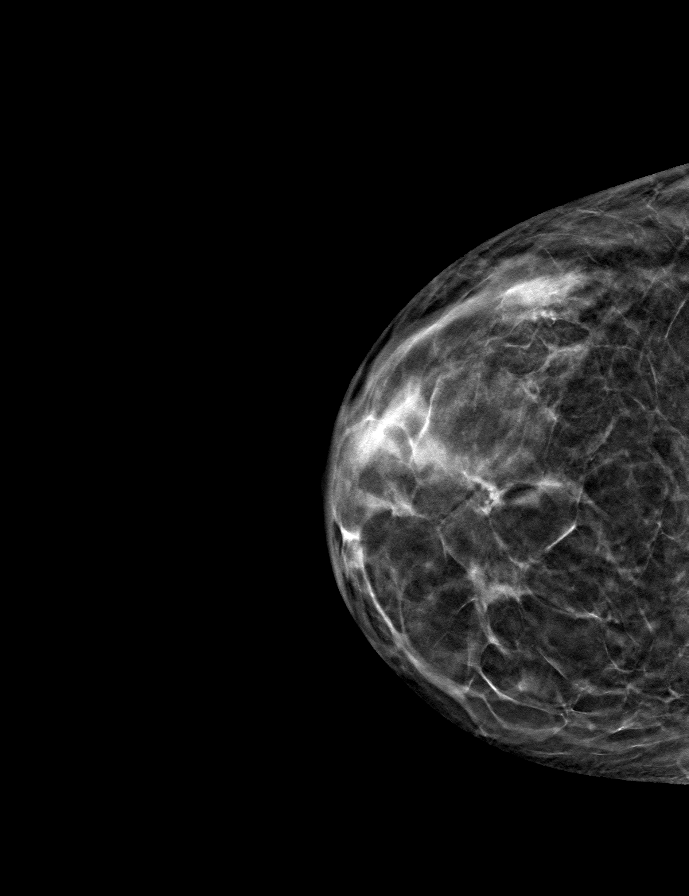

[L MLO tomo · tomo slice 36/71.0]
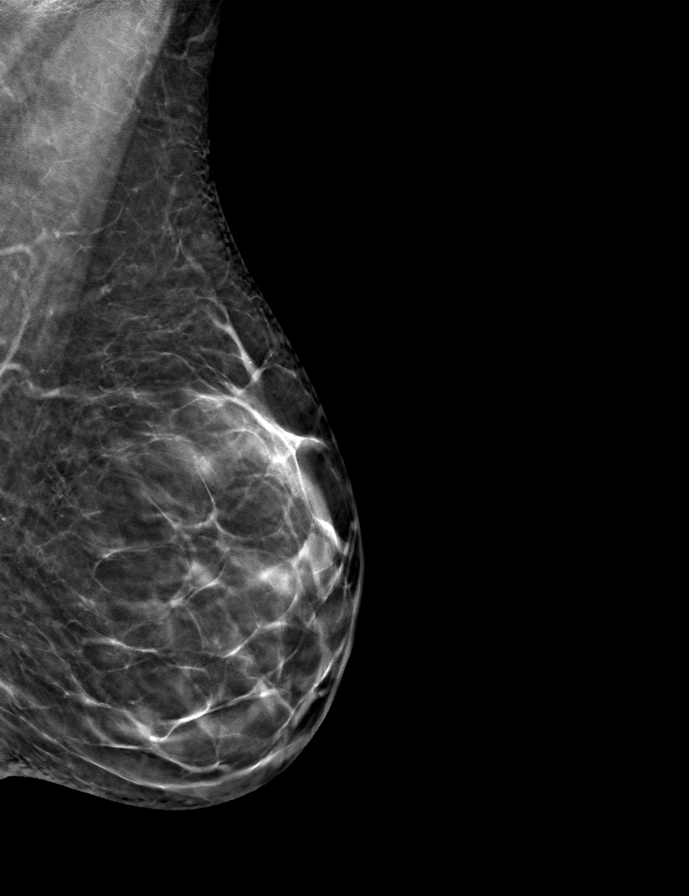

[L CC tomo · tomo slice 33/64.0]
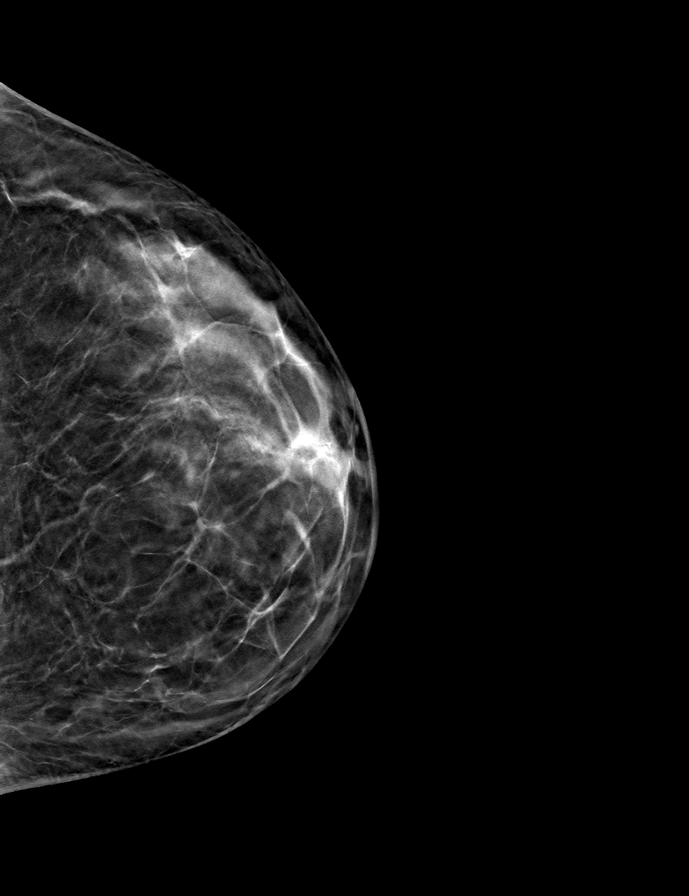

[R MLO tomo · tomo slice 35/69.0]
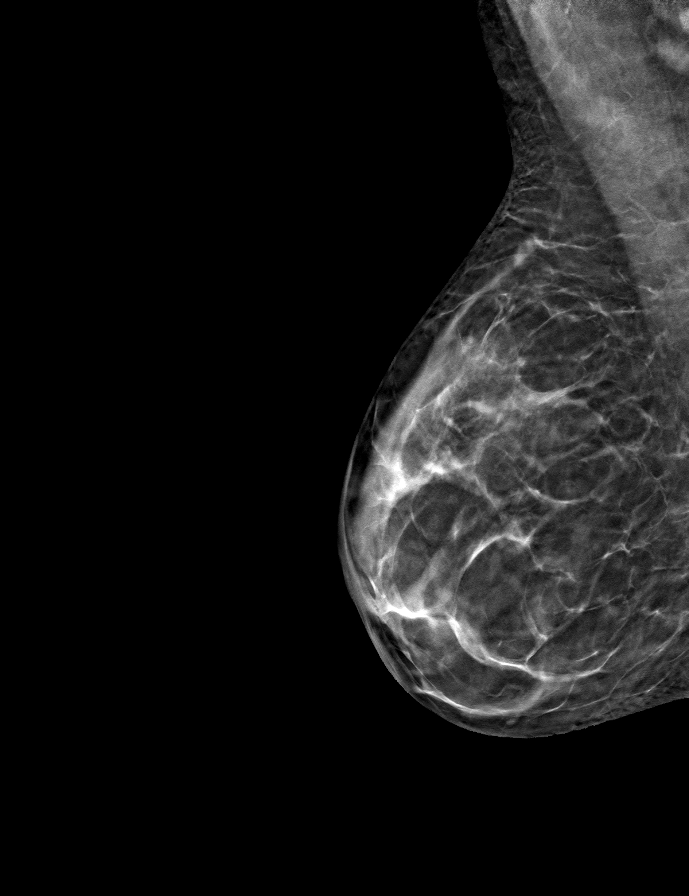

[9 of 24 positions shown; findings below may reference images not displayed]

ACR Breast Density Category b: There are scattered areas of
fibroglandular density.
FINDINGS: There are no findings suspicious for malignancy.
IMPRESSION: No mammographic evidence of malignancy. A result letter of this
screening mammogram will be mailed directly to the patient.

RECOMMENDATION:
Screening mammogram in one year. (Code:51-O-LD2)

BI-RADS CATEGORY  1: Negative.

## 2023-03-30 ENCOUNTER — Other Ambulatory Visit: Payer: Self-pay

## 2023-04-02 ENCOUNTER — Other Ambulatory Visit (HOSPITAL_COMMUNITY): Payer: Self-pay

## 2023-04-03 ENCOUNTER — Other Ambulatory Visit (HOSPITAL_COMMUNITY): Payer: Self-pay

## 2023-04-03 ENCOUNTER — Other Ambulatory Visit: Payer: Self-pay | Admitting: Gastroenterology

## 2023-04-03 MED ORDER — RINVOQ 30 MG PO TB24
30.0000 mg | ORAL_TABLET | Freq: Every day | ORAL | 3 refills | Status: DC
  Filled 2023-04-03: qty 30, 30d supply, fill #0
  Filled 2023-05-01: qty 30, 30d supply, fill #1
  Filled 2023-05-31: qty 30, 30d supply, fill #2

## 2023-04-03 MED ORDER — FLUOXETINE HCL 20 MG PO CAPS
20.0000 mg | ORAL_CAPSULE | Freq: Every day | ORAL | 3 refills | Status: DC
  Filled 2023-04-03 – 2023-04-11 (×2): qty 90, 90d supply, fill #0
  Filled 2023-07-11: qty 90, 90d supply, fill #1
  Filled 2023-10-08: qty 90, 90d supply, fill #2
  Filled 2024-01-12 (×2): qty 90, 90d supply, fill #3

## 2023-04-09 ENCOUNTER — Other Ambulatory Visit (HOSPITAL_COMMUNITY): Payer: Self-pay

## 2023-04-11 ENCOUNTER — Other Ambulatory Visit (HOSPITAL_COMMUNITY): Payer: Self-pay

## 2023-04-11 ENCOUNTER — Other Ambulatory Visit: Payer: BC Managed Care – PPO

## 2023-04-13 ENCOUNTER — Other Ambulatory Visit: Payer: BC Managed Care – PPO

## 2023-04-13 DIAGNOSIS — K51019 Ulcerative (chronic) pancolitis with unspecified complications: Secondary | ICD-10-CM

## 2023-04-17 LAB — CALPROTECTIN, FECAL: Calprotectin, Fecal: 35 ug/g (ref 0–120)

## 2023-05-01 ENCOUNTER — Other Ambulatory Visit (HOSPITAL_COMMUNITY): Payer: Self-pay

## 2023-05-01 ENCOUNTER — Ambulatory Visit (HOSPITAL_COMMUNITY)
Admission: RE | Admit: 2023-05-01 | Discharge: 2023-05-01 | Disposition: A | Discharge status: Home / Self Care | Payer: BC Managed Care – PPO | Source: Ambulatory Visit | Attending: Urology | Admitting: Urology

## 2023-05-01 DIAGNOSIS — D179 Benign lipomatous neoplasm, unspecified: Secondary | ICD-10-CM

## 2023-05-04 ENCOUNTER — Other Ambulatory Visit (HOSPITAL_COMMUNITY): Payer: Self-pay

## 2023-05-07 ENCOUNTER — Ambulatory Visit (INDEPENDENT_AMBULATORY_CARE_PROVIDER_SITE_OTHER): Payer: BC Managed Care – PPO | Admitting: Urology

## 2023-05-07 VITALS — BP 149/82 | HR 57

## 2023-05-07 DIAGNOSIS — D179 Benign lipomatous neoplasm, unspecified: Secondary | ICD-10-CM

## 2023-05-07 NOTE — Progress Notes (Unsigned)
05/07/2023 3:17 PM   Lauren [name] 1961-04-06 [id number]  Referring provider: Tracey Harries, MD [address] [place],  [place] [address]  Followup renal AMLs   HPI: Lauren [name] is a 62yo here for followup for left renal AMLs. Renal US from today shows three stable left renal AMLs. UA shows 3-10 RBCs. No worsening LUTS. No other complaints today   PMH: Past Medical History:  Diagnosis Date   Allergy    Anemia    Anxiety    Cellulitis of left breast 09/17/2015   diagnosed at Urgent Care- on ABX   Clostridium difficile diarrhea    Clotting disorder (HCC)    Depression    takes Prozac daily   Hearing loss    right ear   History of colon polyps    benign   Hyperlipidemia    Hypertension    takes Metoprolol daily   Hypokalemia    takes Potassium every other day   Inflammatory polyps of colon (HCC)    Internal hemorrhoids    Mucosal abnormality of stomach    Osteopenia    Osteoporosis    Personal history of meningioma of the brain    Platelet disorder (HCC)    Pneumonia    hx of-about 4+ yrs ago   Seasonal allergies    takes Allegra as needed and Flonase daily   SVT (supraventricular tachycardia)    Tennis elbow    Ulcerative colitis    On Entyvio q 4 week dosing   Urinary urgency    Vitamin B12 deficiency    [place] disease (HCC)     Surgical History: Past Surgical History:  Procedure Laterality Date   BAHA REVISION Right 03/15/2016   Procedure: REVISION RIGHT BAHA HEARING IMPLANT ;  Surgeon: Ermalinda Barrios, MD;  Location: [facility] OR;  Service: ENT;  Laterality: Right;   BIOPSY  01/24/2019   Procedure: BIOPSY;  Surgeon: Benancio Deeds, MD;  Location: [facility];  Service: Gastroenterology;;   BIOPSY  01/15/2020   Procedure: BIOPSY;  Surgeon: Benancio Deeds, MD;  Location: [facility];  Service: Gastroenterology;;   BIOPSY  03/22/2021   Procedure: BIOPSY;  Surgeon: Benancio Deeds, MD;  Location: [facility];  Service:  Gastroenterology;;   BIOPSY  05/04/2022   Procedure: BIOPSY;  Surgeon: Benancio Deeds, MD;  Location: [facility];  Service: Gastroenterology;;   BRAIN MENINGIOMA EXCISION  05/2007   CARDIAC ELECTROPHYSIOLOGY STUDY AND ABLATION  10/22/09   COLONOSCOPY N/A 07/29/2013   Procedure: COLONOSCOPY;  Surgeon: Hart Carwin, MD;  Location: [facility];  Service: Endoscopy;  Laterality: N/A;   COLONOSCOPY WITH PROPOFOL N/A 09/21/2015   Procedure: COLONOSCOPY WITH PROPOFOL;  Surgeon: Ruffin Frederick, MD;  Location: [facility];  Service: Gastroenterology;  Laterality: N/A;   COLONOSCOPY WITH PROPOFOL N/A 10/30/2017   Procedure: COLONOSCOPY WITH PROPOFOL;  Surgeon: Benancio Deeds, MD;  Location: [facility];  Service: Gastroenterology;  Laterality: N/A;   COLONOSCOPY WITH PROPOFOL N/A 01/24/2019   Procedure: COLONOSCOPY WITH PROPOFOL;  Surgeon: Benancio Deeds, MD;  Location: [facility];  Service: Gastroenterology;  Laterality: N/A;   COLONOSCOPY WITH PROPOFOL N/A 01/15/2020   Procedure: COLONOSCOPY WITH PROPOFOL;  Surgeon: Benancio Deeds, MD;  Location: [facility];  Service: Gastroenterology;  Laterality: N/A;  needs DDAVP 30-60 minutes prior to procedure.   COLONOSCOPY WITH PROPOFOL N/A 03/22/2021   Procedure: COLONOSCOPY WITH PROPOFOL;  Surgeon: Benancio Deeds, MD;  Location: [facility];  Service: Gastroenterology;  Laterality: N/A;   COLONOSCOPY WITH PROPOFOL N/A 05/04/2022   Procedure: COLONOSCOPY WITH PROPOFOL;  Surgeon: Benancio Deeds, MD;  Location: [facility];  Service: Gastroenterology;  Laterality: N/A;   ESOPHAGOGASTRODUODENOSCOPY     HEMOSTASIS CLIP PLACEMENT  03/22/2021   Procedure: HEMOSTASIS CLIP PLACEMENT;  Surgeon: Benancio Deeds, MD;  Location: [facility];  Service: Gastroenterology;;   IMPLANTATION BONE ANCHORED HEARING AID Right    POLYPECTOMY  01/24/2019   Procedure: POLYPECTOMY;  Surgeon: Benancio Deeds, MD;  Location: [facility];  Service: Gastroenterology;;   POLYPECTOMY  01/15/2020   Procedure: POLYPECTOMY;  Surgeon: Benancio Deeds, MD;  Location: [facility];  Service: Gastroenterology;;   POLYPECTOMY  03/22/2021   Procedure: POLYPECTOMY;  Surgeon: Benancio Deeds, MD;  Location: [facility];  Service: Gastroenterology;;   WISDOM TOOTH EXTRACTION      Home Medications:  Allergies as of 05/07/2023       Reactions   Atenolol Swelling   Nsaids Other (See Comments)   PLATELET DISORDER  NSAIDS contraindicated with bleeding disorder. NSAIDS contraindicated with bleeding disorder.   Sulfamethoxazole Other (See Comments), Rash   "redness"   Asa [aspirin] Other (See Comments)   Platelet disorder   Cephalexin Other (See Comments)   Upset her colitis   Corn Starch Other (See Comments)   Gi-upset Allergy test   Ibuprofen Other (See Comments)   Platelet disrder   Mercaptopurine Hives, Itching   Elevated liver levels   Soy Allergy Other (See Comments)   GI symptoms         Medication List        Accurate as of May 07, 2023  3:17 PM. If you have any questions, ask your nurse or doctor.          clotrimazole-betamethasone cream Commonly known as: LOTRISONE Apply 1 application topically daily as needed (Eczema or inflamed in skull).   FLUoxetine 20 MG capsule Commonly known as: PROZAC Take 20 mg by mouth daily   FLUoxetine 20 MG capsule Commonly known as: PROZAC Take 1 capsule (20 mg total) by mouth daily.   hydrocortisone 2.5 % ointment Apply 1 application topically 2 (two) times daily as needed (for eczema).   metoprolol succinate 50 MG 24 hr tablet Commonly known as: TOPROL-XL Take 50 mg by mouth daily.   NON FORMULARY Take 1 tablet by mouth daily. Terrazyme (Essential Oil) Digestive enzyme   OVER THE COUNTER MEDICATION Take 1 capsule by mouth daily. Doterra  Micro plex Vmz   OVER THE COUNTER MEDICATION Take 1 tablet by mouth daily. Doterra Dr-prime  culture complex   Rinvoq 30 MG Tb24 Generic drug: Upadacitinib ER Take 1 tablet (30 mg total) by mouth daily.   tranexamic acid 650 MG Tabs tablet Commonly known as: LYSTEDA Take 1,300 mg by mouth 3 (three) times daily as needed (FOR INTERNAL PROCEDURE TO STOP BLEEDING). "For internal procedure to stop bleeding   Vitamin D3 125 MCG (5000 UT) Caps Take 5,000 Units by mouth daily.        Allergies:  Allergies  Allergen Reactions   Atenolol Swelling   Nsaids Other (See Comments)    PLATELET DISORDER  NSAIDS contraindicated with bleeding disorder.  NSAIDS contraindicated with bleeding disorder.   Sulfamethoxazole Other (See Comments) and Rash    "redness"   Asa [Aspirin] Other (See Comments)    Platelet disorder    Cephalexin Other (See Comments)    Upset her colitis    Corn Starch Other (See Comments)  Gi-upset  Allergy test   Ibuprofen Other (See Comments)    Platelet disrder   Mercaptopurine Hives and Itching    Elevated liver levels   Soy Allergy Other (See Comments)    GI symptoms     Family History: Family History  Problem Relation Age of Onset   Heart disease Mother    Stroke Mother    Diabetes Maternal Uncle    Heart disease Maternal Grandfather    Breast cancer Paternal Grandmother    Colon cancer Neg Hx    Esophageal cancer Neg Hx    Rectal cancer Neg Hx    Stomach cancer Neg Hx     Social History:  reports that she has never smoked. She has never been exposed to tobacco smoke. She has never used smokeless tobacco. She reports that she does not drink alcohol and does not use drugs.  ROS: All other review of systems were reviewed and are negative except what is noted above in HPI  Physical Exam: BP (!) 149/82   Pulse (!) 57   LMP 11/26/2008   Constitutional:  Alert and oriented, No acute distress. HEENT: [place] AT, moist mucus membranes.  Trachea midline, no masses. Cardiovascular: No clubbing, cyanosis, or edema. Respiratory: Normal  respiratory effort, no increased work of breathing. GI: Abdomen is soft, nontender, nondistended, no abdominal masses GU: No CVA tenderness.  Lymph: No cervical or inguinal lymphadenopathy. Skin: No rashes, bruises or suspicious lesions. Neurologic: Grossly intact, no focal deficits, moving all 4 extremities. Psychiatric: Normal mood and affect.  Laboratory Data: Lab Results  Component Value Date   WBC 6.4 10/26/2022   HGB 12.8 10/26/2022   HCT 38.6 10/26/2022   MCV 90.3 10/26/2022   PLT 242.0 10/26/2022    Lab Results  Component Value Date   CREATININE 0.77 10/26/2022    No results found for: "PSA"  No results found for: "TESTOSTERONE"  Lab Results  Component Value Date   HGBA1C 5.7 09/03/2009    Urinalysis    Component Value Date/Time   COLORURINE LT. YELLOW 07/31/2013 0811   APPEARANCEUR Clear 11/08/2022 1400   LABSPEC 1.015 07/31/2013 0811   PHURINE 6.0 07/31/2013 0811   GLUCOSEU Negative 11/08/2022 1400   GLUCOSEU NEGATIVE 07/31/2013 0811   HGBUR Moderate 07/31/2013 0811   BILIRUBINUR Negative 11/08/2022 1400   KETONESUR negative 09/26/2016 1814   KETONESUR NEGATIVE 07/31/2013 0811   PROTEINUR Negative 11/08/2022 1400   PROTEINUR NEGATIVE 06/24/2007 1621   UROBILINOGEN 0.2 09/26/2016 1814   UROBILINOGEN 0.2 07/31/2013 0811   NITRITE Negative 11/08/2022 1400   NITRITE NEGATIVE 07/31/2013 0811   LEUKOCYTESUR 1+ (A) 11/08/2022 1400    Lab Results  Component Value Date   LABMICR See below: 11/08/2022   WBCUA 0-5 11/08/2022   LABEPIT 0-10 11/08/2022   MUCUS neg 12/31/2014   BACTERIA None seen 11/08/2022    Pertinent Imaging: Renal US today: Images reviewed and discussed with the patient  No results found for this or any previous visit.  No results found for this or any previous visit.  No results found for this or any previous visit.  No results found for this or any previous visit.  Results for orders placed during the hospital encounter of  11/01/22  Ultrasound renal complete  Narrative CLINICAL DATA:  Hematuria.  History of angio myelolipomas.  EXAM: RENAL / URINARY TRACT ULTRASOUND COMPLETE  COMPARISON:  Renal ultrasound in a November 09, 2021. CT scan of the abdomen and pelvis May 12, 2022.  FINDINGS:  Right Kidney:  Renal measurements: 11.2 x 4.3 x 5.2 cm = volume: 130 mL. Echogenicity within normal limits. No mass or hydronephrosis visualized.  Left Kidney:  Renal measurements: 10.3 x 5.2 x 5.3 cm = volume: 148 mL. An echogenic mass in the medial lower pole measures 7 x 9 x 8 mm today versus 7 x 7 x 7 mm on the November 09, 2021 study. The small difference in measurement may be technical.  The second mass in the lower pole measures 5 by 6 x 8 mm today versus 5 x 5 x 4 mm previously.  A new hyperechoic mass is identified in the medial upper pole measuring 4 x 5 x 5 mm today, not visualized previously.  Bladder:  Appears normal for degree of bladder distention.  Other:  None.  IMPRESSION: 1. The 2 largest masses in the left kidney correspond to fat containing masses on a CT scan dated May 13, 2019 consistent with angiomyelolipomas. The small differences in measurement could be technical. Recommend continued attention on follow-up. 2. The new mass in the medial upper pole measuring up to 5 mm is also hyperechoic. While this may represent an angio myelolipoma given appearance, it was not visualized on previous imaging. An MRI of the abdomen with and without contrast could definitively characterize this mass. Alternatively, a follow-up ultrasound in 6 months could ensure stability. 3. The right kidney is normal. 4. The bladder is unremarkable.   Electronically Signed By: Gerome Sam III M.D. On: 11/02/2022 10:51  No valid procedures specified. No results found for this or any previous visit.  No results found for this or any previous visit.   Assessment & Plan:    1.  Angiomyolipoma 6 months renal US. If the AMLs are stable in 6 months we will switch to yearly renal US   No follow-ups on file.  Wilkie Aye, MD  [facility] [place]

## 2023-05-08 ENCOUNTER — Other Ambulatory Visit (HOSPITAL_COMMUNITY): Payer: Self-pay

## 2023-05-08 ENCOUNTER — Encounter: Payer: Self-pay | Admitting: Urology

## 2023-05-08 LAB — URINALYSIS, ROUTINE W REFLEX MICROSCOPIC
Bilirubin, UA: NEGATIVE
Glucose, UA: NEGATIVE
Ketones, UA: NEGATIVE
Leukocytes,UA: NEGATIVE
Nitrite, UA: NEGATIVE
Protein,UA: NEGATIVE
Specific Gravity, UA: 1.01 (ref 1.005–1.030)
Urobilinogen, Ur: 0.2 mg/dL (ref 0.2–1.0)
pH, UA: 6 (ref 5.0–7.5)

## 2023-05-08 LAB — MICROSCOPIC EXAMINATION
Bacteria, UA: NONE SEEN
WBC, UA: NONE SEEN /HPF (ref 0–5)

## 2023-05-08 NOTE — Patient Instructions (Signed)

## 2023-05-15 ENCOUNTER — Other Ambulatory Visit (HOSPITAL_COMMUNITY): Payer: Self-pay

## 2023-05-15 ENCOUNTER — Other Ambulatory Visit: Payer: Self-pay

## 2023-05-15 MED ORDER — METOPROLOL SUCCINATE ER 50 MG PO TB24
50.0000 mg | ORAL_TABLET | Freq: Every day | ORAL | 3 refills | Status: DC
  Filled 2023-05-15: qty 90, 90d supply, fill #0
  Filled 2023-08-02: qty 90, 90d supply, fill #1
  Filled 2023-11-05: qty 90, 90d supply, fill #2

## 2023-05-16 ENCOUNTER — Other Ambulatory Visit (HOSPITAL_COMMUNITY): Payer: Self-pay

## 2023-05-16 ENCOUNTER — Other Ambulatory Visit: Payer: Self-pay

## 2023-05-18 ENCOUNTER — Other Ambulatory Visit (HOSPITAL_COMMUNITY): Payer: Self-pay

## 2023-05-31 ENCOUNTER — Other Ambulatory Visit (HOSPITAL_COMMUNITY): Payer: Self-pay | Admitting: Pharmacy Technician

## 2023-05-31 ENCOUNTER — Other Ambulatory Visit (HOSPITAL_COMMUNITY): Payer: Self-pay

## 2023-05-31 NOTE — Progress Notes (Signed)
Specialty Pharmacy Refill Coordination Note  Lauren [name] is a 62 y.o. female contacted today regarding refills of specialty medication(s) Upadacitinib   Patient requested [name] at [facility] at [place] date: 06/07/23   Medication will be filled on 06/06/23.

## 2023-06-05 ENCOUNTER — Other Ambulatory Visit: Payer: Self-pay | Admitting: Physician Assistant

## 2023-06-05 DIAGNOSIS — Z1231 Encounter for screening mammogram for malignant neoplasm of breast: Secondary | ICD-10-CM

## 2023-06-07 ENCOUNTER — Other Ambulatory Visit (HOSPITAL_COMMUNITY): Payer: Self-pay

## 2023-07-02 ENCOUNTER — Other Ambulatory Visit (HOSPITAL_COMMUNITY): Payer: Self-pay

## 2023-07-04 ENCOUNTER — Other Ambulatory Visit: Payer: Self-pay

## 2023-07-04 ENCOUNTER — Ambulatory Visit: Payer: BC Managed Care – PPO | Admitting: Gastroenterology

## 2023-07-04 ENCOUNTER — Encounter: Payer: Self-pay | Admitting: Gastroenterology

## 2023-07-04 VITALS — BP 112/62 | Wt 163.0 lb

## 2023-07-04 DIAGNOSIS — K51019 Ulcerative (chronic) pancolitis with unspecified complications: Secondary | ICD-10-CM | POA: Diagnosis not present

## 2023-07-04 DIAGNOSIS — Z23 Encounter for immunization: Secondary | ICD-10-CM

## 2023-07-04 DIAGNOSIS — Z79899 Other long term (current) drug therapy: Secondary | ICD-10-CM | POA: Diagnosis not present

## 2023-07-04 MED ORDER — RINVOQ 30 MG PO TB24
30.0000 mg | ORAL_TABLET | Freq: Every day | ORAL | 11 refills | Status: DC
  Filled 2023-07-04 – 2023-07-12 (×3): qty 30, 30d supply, fill #0

## 2023-07-04 NOTE — Progress Notes (Signed)
HPI :  Colitis History Initially left sided ulcerative colitis, diagnosed > 15 years ago. On Remicade in the past for her colitis, which she thinks she did well for a few years. She had a severe MRSA infection of her ear and ultimately Remicade was held and eventually was stopped given she had done well when it was held. She has never been on methotrexate. She has been on Lialda for a long time, prior to that Asacol. She has Von Willebrands. She bleeds easily but has never had significant bleeding / hemorrhages in the past. She has had DDAVP infusions prior to her last colonoscopy or Stilmate nose spray.  Started on [facility] April 2018 following a few flares that Spring. Failed Entyvio 2019, started Humira and Feb / March 2020. Failed Humira / 6 MP combination therapy 12/2018. Hepatoxicity to thiopurines - levels > 50K. Transitioned to Stelara monotherapy 02/2019. Now PANCOLONIC colitis. Moderately active disease on every 4 weeks dosing of Stelara so it was also stopped summer 2022. Noted to have pancolitis at that time. Started Rinvoq [name] 2023.     SINCE LAST VISIT:   62 year old female here for follow-up visit for her colitis.  Recall since her last visit we increased her Rinvoq to 30 mg daily given she was having some breakthrough symptoms and she had an elevated fecal calprotectin in April of over 600s.  She has clinically responded quite well to the higher dosing of Rinvoq.  She states she feels back to normal, no further urgency, no bleeding.  No abdominal pains.  Her baseline bowel movements are actually a bit on the constipated side, once or twice per week however she tolerates that fine without any problems.  She has been feeling really well.  She states she had a lipid panel with her primary care in recent months which looked okay.  She had a follow-up fecal calprotectin on the higher dose of Rinvoq in August and it returned to normal, level of only 35.  She denies any complaints at  this time.  No other changes in her health status otherwise.  We discussed her regimen at length.  She is up-to-date with her vaccinations with exception of flu shot and COVID vaccine.  She is not interested in the COVID-vaccine but does want to have the flu shot.  She is due for QuantiFERON gold as well.      IBD Health Care Maintenance: Annual Flu Vaccine - Date due: 2024 due Pneumococcal Vaccine if receiving immunosuppression: - Date PCV 13 - 05/21/2018, PPSV 23 05/01/19 Zoster vaccine 08/15/19, 05/17/19 COVID VACCINE deferred 2024 TB testing if on anti-TNF, yearly - negative 06/2021 - DUE Vitamin D screening - Date 11/08/20 - level of 49.18 Last Colonoscopy :   Colonoscopy 03/22/21 - The examined portion of the ileum was normal. - Moderately active (Mayo Score 2) ulcerative colitis in the rectum / rectosigmoid colon. Biopsied. - Mild (Mayo Score 1) ulcerative colitis thoughout elsewhere. Biopsied. - One 6 mm polyp in the distal transverse colon, removed with a hot snare. Resected and retrieved. Clips were placed. - One 2 to 3 mm polyp at the recto-sigmoid colon, removed with a cold biopsy forceps. Resected and retrieved. - The examination was otherwise normal. Unfortunately active inflammation despite Stelara q 4 weeks dosing. Will discuss options with the patient.     FINAL MICROSCOPIC DIAGNOSIS:   A. COLON, RIGHT, BIOPSY:  - Chronic moderately active colitis.  - No dysplasia or malignancy.   B. COLON,  TRANSVERSE, BIOPSY:  - Chronic moderately active colitis.  - No dysplasia or malignancy.   C. COLON, TRANSVERSE, POLYPECTOMY:  - Inflammatory pseudopolyp.  - No dysplasia or malignancy.   D. COLON, LEFT, BIOPSY:  - Chronic mildly active colitis.  - No dysplasia or malignancy.   E. COLON, RECTOSIGMOID, RECTUM, BIOPSY:  - Chronic moderately active colitis.  - No dysplasia or malignancy.   F. RECTUM, POLYPECTOMY:  - Inflammatory pseudopolyp.  - No dysplasia or malignancy.       Colonoscopy 01/15/20 - The examined portion of the ileum was normal. - One 3 to 4 mm polyp in the sigmoid colon, removed with a cold snare. Resected and retrieved. - One 3 mm polyp at the recto-sigmoid colon, removed with a cold biopsy forceps. Resected and retrieved. - Mild (Mayo Score 1) ulcerative colitis of the distal 20cm, otherwise no other inflammatory changes noted. Overall, significantly improved since the last examination. Biopsied. - Biopsies for surveillance were taken from the right colon, left colon, transverse colon, and Rectum.    Colonoscopy 01/24/2019 - severe left sided inflammation, mild in transverse and right colon, normal ileum. Benign inflammatory polyp of the cecum   Colonoscopy 2014 - active rectal inflammation, inflammatory polyp Colonoscopy 09/21/2015 - overall good control of colitis with small mild area of inflammation in left colon, mild chronic colitis Colonoscopy 10/30/2017 - normal ileum, 10-15cm segment of mild inflammation, otherwise normal - mildly active colitis      Colonoscopy 05/04/2022: - The examined portion of the ileum was normal. - Internal hemorrhoids. - The examination was otherwise normal. - Biopsies were taken with a cold forceps for histology in the rectum, in the sigmoid colon, in the descending colon, in the transverse colon and in the ascending colon. Interval healing of colonic mucosa on Rinvoq, endoscopically appears to be in remission.   FINAL MICROSCOPIC DIAGNOSIS:   A. RIGHT COLON, BIOPSY:  - Chronic inactive colitis  - Negative for granulomas, viral cytopathic effect, dysplasia or  malignancy   B. TRANSVERSE COLON, BIOPSY:  - Chronic inactive colitis  - Negative for granulomas, viral cytopathic effect, dysplasia or  malignancy   C. LEFT COLON, BIOPSY:  - Chronic inactive colitis with Paneth cell metaplasia  - Negative for granulomas, viral cytopathic effect, dysplasia or  malignancy   D. RECTAL COLON, BIOPSY:  -  Chronic inactive colitis with Paneth cell metaplasia  - Negative for granulomas, viral cytopathic effect, dysplasia or  malignancy     Fecal calprotectin 12/14/22 - 668  Fecal calprotectin 04/13/23 - 35  Past Medical History:  Diagnosis Date   Allergy    Anemia    Anxiety    Cellulitis of left breast 09/17/2015   diagnosed at Urgent Care- on ABX   Clostridium difficile diarrhea    Clotting disorder (HCC)    Depression    takes Prozac daily   Hearing loss    right ear   History of colon polyps    benign   Hyperlipidemia    Hypertension    takes Metoprolol daily   Hypokalemia    takes Potassium every other day   Inflammatory polyps of colon (HCC)    Internal hemorrhoids    Mucosal abnormality of stomach    Osteopenia    Osteoporosis    Personal history of meningioma of the brain    Platelet disorder (HCC)    Pneumonia    hx of-about 4+ yrs ago   Seasonal allergies    takes Allegra as needed  and Flonase daily   SVT (supraventricular tachycardia) (HCC)    Tennis elbow    Ulcerative colitis    On Entyvio q 4 week dosing   Urinary urgency    Vitamin B12 deficiency    Von Willebrands disease (HCC)      Past Surgical History:  Procedure Laterality Date   BAHA REVISION Right 03/15/2016   Procedure: REVISION RIGHT BAHA HEARING IMPLANT ;  Surgeon: Ermalinda Barrios, MD;  Location: [facility] OR;  Service: ENT;  Laterality: Right;   BIOPSY  01/24/2019   Procedure: BIOPSY;  Surgeon: Benancio Deeds, MD;  Location: [facility];  Service: Gastroenterology;;   BIOPSY  01/15/2020   Procedure: BIOPSY;  Surgeon: Benancio Deeds, MD;  Location: [facility];  Service: Gastroenterology;;   BIOPSY  03/22/2021   Procedure: BIOPSY;  Surgeon: Benancio Deeds, MD;  Location: [facility];  Service: Gastroenterology;;   BIOPSY  05/04/2022   Procedure: BIOPSY;  Surgeon: Benancio Deeds, MD;  Location: [facility];  Service: Gastroenterology;;   BRAIN MENINGIOMA EXCISION  05/2007    CARDIAC ELECTROPHYSIOLOGY STUDY AND ABLATION  10/22/09   COLONOSCOPY N/A 07/29/2013   Procedure: COLONOSCOPY;  Surgeon: Hart Carwin, MD;  Location: [facility];  Service: Endoscopy;  Laterality: N/A;   COLONOSCOPY WITH PROPOFOL N/A 09/21/2015   Procedure: COLONOSCOPY WITH PROPOFOL;  Surgeon: Ruffin Frederick, MD;  Location: [facility];  Service: Gastroenterology;  Laterality: N/A;   COLONOSCOPY WITH PROPOFOL N/A 10/30/2017   Procedure: COLONOSCOPY WITH PROPOFOL;  Surgeon: Benancio Deeds, MD;  Location: [facility];  Service: Gastroenterology;  Laterality: N/A;   COLONOSCOPY WITH PROPOFOL N/A 01/24/2019   Procedure: COLONOSCOPY WITH PROPOFOL;  Surgeon: Benancio Deeds, MD;  Location: [facility];  Service: Gastroenterology;  Laterality: N/A;   COLONOSCOPY WITH PROPOFOL N/A 01/15/2020   Procedure: COLONOSCOPY WITH PROPOFOL;  Surgeon: Benancio Deeds, MD;  Location: [facility];  Service: Gastroenterology;  Laterality: N/A;  needs DDAVP 30-60 minutes prior to procedure.   COLONOSCOPY WITH PROPOFOL N/A 03/22/2021   Procedure: COLONOSCOPY WITH PROPOFOL;  Surgeon: Benancio Deeds, MD;  Location: [facility];  Service: Gastroenterology;  Laterality: N/A;   COLONOSCOPY WITH PROPOFOL N/A 05/04/2022   Procedure: COLONOSCOPY WITH PROPOFOL;  Surgeon: Benancio Deeds, MD;  Location: [facility];  Service: Gastroenterology;  Laterality: N/A;   ESOPHAGOGASTRODUODENOSCOPY     HEMOSTASIS CLIP PLACEMENT  03/22/2021   Procedure: HEMOSTASIS CLIP PLACEMENT;  Surgeon: Benancio Deeds, MD;  Location: [facility];  Service: Gastroenterology;;   IMPLANTATION BONE ANCHORED HEARING AID Right    POLYPECTOMY  01/24/2019   Procedure: POLYPECTOMY;  Surgeon: Benancio Deeds, MD;  Location: [facility];  Service: Gastroenterology;;   POLYPECTOMY  01/15/2020   Procedure: POLYPECTOMY;  Surgeon: Benancio Deeds, MD;  Location: [facility];  Service: Gastroenterology;;   POLYPECTOMY   03/22/2021   Procedure: POLYPECTOMY;  Surgeon: Benancio Deeds, MD;  Location: [facility];  Service: Gastroenterology;;   WISDOM TOOTH EXTRACTION     Family History  Problem Relation Age of Onset   Heart disease Mother    Stroke Mother    Diabetes Maternal Uncle    Heart disease Maternal Grandfather    Breast cancer Paternal Grandmother    Colon cancer Neg Hx    Esophageal cancer Neg Hx    Rectal cancer Neg Hx    Stomach cancer Neg Hx    Social History   Tobacco Use   Smoking status: Never    Passive exposure:  Never   Smokeless tobacco: Never  Vaping Use   Vaping status: Never Used  Substance Use Topics   Alcohol use: No    Alcohol/week: 0.0 standard drinks of alcohol   Drug use: No   Current Outpatient Medications  Medication Sig Dispense Refill   Cholecalciferol (VITAMIN D3) 125 MCG (5000 UT) CAPS Take 5,000 Units by mouth daily.     clotrimazole-betamethasone (LOTRISONE) cream Apply 1 application topically daily as needed (Eczema or inflamed in skull).     FLUoxetine (PROZAC) 20 MG capsule Take 20 mg by mouth daily 90 capsule 0   FLUoxetine (PROZAC) 20 MG capsule Take 1 capsule (20 mg total) by mouth daily. 90 capsule 3   hydrocortisone 2.5 % ointment Apply 1 application topically 2 (two) times daily as needed (for eczema).     metoprolol succinate (TOPROL-XL) 50 MG 24 hr tablet Take 50 mg by mouth daily.     metoprolol succinate (TOPROL-XL) 50 MG 24 hr tablet Take 1 tablet (50 mg total) by mouth daily. 90 tablet 3   NON FORMULARY Take 1 tablet by mouth daily. Terrazyme (Essential Oil) Digestive enzyme     OVER THE COUNTER MEDICATION Take 1 capsule by mouth daily. Doterra  Micro plex Vmz     OVER THE COUNTER MEDICATION Take 1 tablet by mouth daily. Doterra Dr-prime culture complex     tranexamic acid (LYSTEDA) 650 MG TABS tablet Take 1,300 mg by mouth 3 (three) times daily as needed (FOR INTERNAL PROCEDURE TO STOP BLEEDING). "For internal procedure to stop  bleeding     Upadacitinib ER (RINVOQ) 30 MG TB24 Take 1 tablet (30 mg total) by mouth daily. 30 tablet 3   Current Facility-Administered Medications  Medication Dose Route Frequency Provider Last Rate Last Admin   desmopressin (DDAVP) 23.6 mcg in sodium chloride 0.9 % IV Syringe  0.3 mcg/kg Intravenous Once Jerik Falletta, Willaim Rayas, MD       Allergies  Allergen Reactions   Atenolol Swelling   Nsaids Other (See Comments)    PLATELET DISORDER  NSAIDS contraindicated with bleeding disorder.  NSAIDS contraindicated with bleeding disorder.   Sulfamethoxazole Other (See Comments) and Rash    "redness"   Asa [Aspirin] Other (See Comments)    Platelet disorder    Cephalexin Other (See Comments)    Upset her colitis    Corn Starch Other (See Comments)    Gi-upset  Allergy test   Ibuprofen Other (See Comments)    Platelet disrder   Mercaptopurine Hives and Itching    Elevated liver levels   Soy Allergy Other (See Comments)    GI symptoms      Review of Systems: All systems reviewed and negative except where noted in HPI.   Lab Results  Component Value Date   WBC 6.4 10/26/2022   HGB 12.8 10/26/2022   HCT 38.6 10/26/2022   MCV 90.3 10/26/2022   PLT 242.0 10/26/2022    Lab Results  Component Value Date   NA 139 10/26/2022   CL 101 10/26/2022   K 4.8 10/26/2022   CO2 31 10/26/2022   BUN 20 10/26/2022   CREATININE 0.77 10/26/2022   GFR 83.12 10/26/2022   CALCIUM 10.1 10/26/2022   PHOS 3.7 01/09/2012   ALBUMIN 4.1 10/26/2022   GLUCOSE 87 10/26/2022    Lab Results  Component Value Date   ALT 10 10/26/2022   AST 19 10/26/2022   ALKPHOS 52 10/26/2022   BILITOT 0.4 10/26/2022      Physical  Exam: BP 112/62   Wt 163 lb (73.9 kg)   LMP 11/26/2008   BMI 32.92 kg/m  Constitutional: Pleasant,well-developed, female in no acute distress. Neurological: Alert and oriented to person place and time. Psychiatric: Normal mood and affect. Behavior is  normal.   ASSESSMENT: 62 y.o. female here for assessment of the following  1. Ulcerative pancolitis with complication (HCC)   2. High risk medication use    As above, failed numerous biologic therapies, eventually transition to Rinvoq in January 2023.  Clinically responded well to induction dosing and maintenance at 15 mg initially.  Over time she did have some recurrent symptoms with an elevated fecal calprotectin of 600s.  At her last visit we increased the Rinvoq to 30 mg daily.  She has had an excellent clinical response with resolution of her prior symptoms.  Her fecal calprotectin normalized on the regimen.  Lipid panel up-to-date and normal per her PCP.  Doing really well at this time.  We discussed long-term risks of the medication to include increased risk for thrombotic events or cardiovascular events, she does not have any significant comorbidities in this leg and appears to be tolerating it well.  We will plan on continuing Rinvoq for now unless she fails drug or has complications from therapy.  Fortunately since she has been on this we have new drugs available to treat colitis should she fail it.  Her vaccinations are otherwise mostly up-to-date, she is due for a flu vaccine and we can give that to her today in the office.  She declines COVID booster.  In regards to timing of next colonoscopy I think we can do that within the next year or so.  She requires colonoscopy at the hospital with DDAVP administration given her history of bleeding disorder, this is a lot for her to go through and I think okay to hold off for now.  I will see her in 6 months or sooner with any issues.  She agrees   PLAN: - continue Rinvoq 30mg  / day - refilled - labs in upcoming months - CBC, CMET, quantiferon gold, fecal calprotectin - recall placed - perhaps colonoscopy later next year - flu shot today - f/u 6 months  Harlin Rain, MD [facility] Gastroenterology

## 2023-07-04 NOTE — Patient Instructions (Addendum)
We are giving you a flu shot today.  We have sent the following medications to your pharmacy for you to pick up at your convenience: Rinvoq 30 mg: Take once daily  You will be due for labs at the end of December. We will remind you when it is time to go.  Please follow up in 6 months.  Thank you for entrusting me with your care and for choosing [facility] Dr. Ileene Patrick     If your blood pressure at your visit was 140/90 or greater, please contact your primary care physician to follow up on this. ______________________________________________________  If you are age 64 or older, your body mass index should be between 23-30. Your Body mass index is 32.92 kg/m. If this is out of the aforementioned range listed, please consider follow up with your Primary Care Provider.  If you are age 27 or younger, your body mass index should be between 19-25. Your Body mass index is 32.92 kg/m. If this is out of the aformentioned range listed, please consider follow up with your Primary Care Provider.  ________________________________________________________  The [facility] providers would like to encourage you to use [facility] to communicate with providers for non-urgent requests or questions.  Due to long hold times on the telephone, sending your provider a message by [facility] may be a faster and more efficient way to get a response.  Please allow 48 business hours for a response.  Please remember that this is for non-urgent requests.  _______________________________________________________  Due to recent changes in healthcare laws, you may see the results of your imaging and laboratory studies on MyChart before your provider has had a chance to review them.  We understand that in some cases there may be results that are confusing or concerning to you. Not all laboratory results come back in the same time frame and the provider may be waiting for multiple results in order to interpret  others.  Please give [place] 48 hours in order for your provider to thoroughly review all the results before contacting the office for clarification of your results.

## 2023-07-06 ENCOUNTER — Encounter (HOSPITAL_COMMUNITY): Payer: Self-pay

## 2023-07-06 ENCOUNTER — Other Ambulatory Visit (HOSPITAL_COMMUNITY): Payer: Self-pay

## 2023-07-09 ENCOUNTER — Other Ambulatory Visit: Payer: Self-pay

## 2023-07-12 ENCOUNTER — Telehealth: Payer: Self-pay | Admitting: Gastroenterology

## 2023-07-12 ENCOUNTER — Telehealth: Payer: Self-pay | Admitting: Pharmacy Technician

## 2023-07-12 ENCOUNTER — Other Ambulatory Visit (HOSPITAL_COMMUNITY): Payer: Self-pay

## 2023-07-12 ENCOUNTER — Other Ambulatory Visit: Payer: Self-pay

## 2023-07-12 NOTE — Telephone Encounter (Signed)
Inbound call from patient requesting a call back to speak about Rinvoq medication. Please advise.

## 2023-07-12 NOTE — Telephone Encounter (Signed)
Called [employer] and they said Linus Galas is ready to be picked up and $0 copay.  Called and spoke to patient.  She will contact them to arrange filling

## 2023-07-12 NOTE — Progress Notes (Signed)
Specialty Pharmacy Refill Coordination Note  Lauren [name] is a 61 y.o. female contacted today regarding refills of specialty medication(s) Upadacitinib   Patient requested [name] at [facility] at [place] date: 07/16/23   Medication will be filled on 07/13/23.

## 2023-07-12 NOTE — Telephone Encounter (Signed)
Clinical questions have been answered and PA submitted. PA currently Pending.

## 2023-07-12 NOTE — Telephone Encounter (Signed)
Pharmacy Patient Advocate Encounter   Received notification from CoverMyMeds that prior authorization for Rinvoq is due for renewal.   Insurance verification completed.   The patient is insured through [facility] .   Per test claim: PA required; PA started via CoverMyMeds. KEY [id number] . Waiting for clinical questions to populate.

## 2023-07-13 ENCOUNTER — Other Ambulatory Visit: Payer: Self-pay

## 2023-07-16 ENCOUNTER — Other Ambulatory Visit (HOSPITAL_COMMUNITY): Payer: Self-pay

## 2023-07-17 ENCOUNTER — Other Ambulatory Visit (HOSPITAL_COMMUNITY): Payer: Self-pay

## 2023-07-17 NOTE — Telephone Encounter (Signed)
Pharmacy Patient Advocate Encounter  Received notification from [facility] that Prior Authorization for Rinvoq has been APPROVED from 07/12/23 to 07/11/24 Filled 11/15   PA #/Case ID/Reference #: PA Case ID #: [id number]

## 2023-07-23 ENCOUNTER — Ambulatory Visit
Admission: RE | Admit: 2023-07-23 | Discharge: 2023-07-23 | Disposition: A | Discharge status: Home / Self Care | Payer: BC Managed Care – PPO | Source: Ambulatory Visit | Attending: Physician Assistant | Admitting: Physician Assistant

## 2023-07-23 DIAGNOSIS — Z1231 Encounter for screening mammogram for malignant neoplasm of breast: Secondary | ICD-10-CM

## 2023-07-25 ENCOUNTER — Other Ambulatory Visit: Payer: Self-pay

## 2023-07-31 ENCOUNTER — Telehealth: Payer: Self-pay

## 2023-07-31 ENCOUNTER — Other Ambulatory Visit: Payer: Self-pay

## 2023-07-31 MED ORDER — RINVOQ 30 MG PO TB24
30.0000 mg | ORAL_TABLET | Freq: Every day | ORAL | 11 refills | Status: DC
  Filled 2023-07-31 – 2023-08-03 (×2): qty 30, 30d supply, fill #0
  Filled 2023-08-23 – 2023-09-04 (×2): qty 30, 30d supply, fill #1
  Filled 2023-09-25: qty 30, 30d supply, fill #2
  Filled 2023-11-02: qty 30, 30d supply, fill #3
  Filled 2023-11-30: qty 30, 30d supply, fill #4
  Filled 2024-01-07: qty 30, 30d supply, fill #5
  Filled 2024-02-05: qty 30, 30d supply, fill #6
  Filled 2024-03-04: qty 30, 30d supply, fill #7
  Filled 2024-04-02 – 2024-04-03 (×2): qty 30, 30d supply, fill #8
  Filled 2024-05-09: qty 30, 30d supply, fill #9
  Filled 2024-06-06: qty 30, 30d supply, fill #10
  Filled 2024-07-03: qty 30, 30d supply, fill #11

## 2023-07-31 MED ORDER — RINVOQ 30 MG PO TB24: 30.0000 mg | ORAL_TABLET | Freq: Every day | ORAL | 11 refills | Status: DC

## 2023-07-31 NOTE — Telephone Encounter (Signed)
Approval for RINVOQ rec'd from [facility] and assigned to [employer].Auth ID: [id number] from 07-12-23 thru 07-11-24. Member [id number]. Script sent to [facility] with approval info

## 2023-08-01 ENCOUNTER — Other Ambulatory Visit (HOSPITAL_COMMUNITY): Payer: Self-pay

## 2023-08-01 MED ORDER — MOMETASONE FUROATE 0.1 % EX CREA
TOPICAL_CREAM | CUTANEOUS | 0 refills | Status: AC
  Filled 2023-08-01: qty 15, 30d supply, fill #0

## 2023-08-02 ENCOUNTER — Other Ambulatory Visit (HOSPITAL_COMMUNITY): Payer: Self-pay

## 2023-08-03 ENCOUNTER — Other Ambulatory Visit: Payer: Self-pay

## 2023-08-03 NOTE — Progress Notes (Signed)
Specialty Pharmacy Refill Coordination Note  Lauren [name] is a 62 y.o. female contacted today regarding refills of specialty medication(s) Upadacitinib   Patient requested [name] at [facility] at [place] date: 08/07/23   Medication will be filled on 08/06/23.

## 2023-08-03 NOTE — Progress Notes (Signed)
Specialty Pharmacy Ongoing Clinical Assessment Note  Lauren [name] is a 62 y.o. female who is being followed by the specialty pharmacy service for RxSp Ulcerative Colitis   Patient's specialty medication(s) reviewed today: Upadacitinib   Missed doses in the last 4 weeks: 0   Patient/Caregiver did not have any additional questions or concerns.   Therapeutic benefit summary: Patient is achieving benefit   Adverse events/side effects summary: No adverse events/side effects   Patient's therapy is appropriate to: Continue    Goals Addressed             This Visit's Progress    Reduce inflammation       Patient is on track. Patient will maintain adherence         Follow up:  6 months  [name] Specialty Pharmacist

## 2023-08-06 ENCOUNTER — Other Ambulatory Visit (HOSPITAL_COMMUNITY): Payer: Self-pay

## 2023-08-06 ENCOUNTER — Other Ambulatory Visit: Payer: Self-pay

## 2023-08-13 ENCOUNTER — Telehealth: Payer: Self-pay

## 2023-08-13 DIAGNOSIS — Z79899 Other long term (current) drug therapy: Secondary | ICD-10-CM

## 2023-08-13 DIAGNOSIS — K51019 Ulcerative (chronic) pancolitis with unspecified complications: Secondary | ICD-10-CM

## 2023-08-13 NOTE — Telephone Encounter (Signed)
Labs ordered. MyChart message to patient to go to the lab

## 2023-08-13 NOTE — Telephone Encounter (Signed)
-----   Message from [facility] Marylu Lund H sent at 07/04/2023 12:25 PM EST ----- Regarding: due for labs Patient will be due for labs at the end of Dec:  CBC, CMET, Quantiferon Gold and fecal calprotectin

## 2023-08-17 ENCOUNTER — Other Ambulatory Visit (INDEPENDENT_AMBULATORY_CARE_PROVIDER_SITE_OTHER): Payer: BC Managed Care – PPO

## 2023-08-17 DIAGNOSIS — Z79899 Other long term (current) drug therapy: Secondary | ICD-10-CM

## 2023-08-17 DIAGNOSIS — K51019 Ulcerative (chronic) pancolitis with unspecified complications: Secondary | ICD-10-CM | POA: Diagnosis not present

## 2023-08-17 LAB — CBC WITH DIFFERENTIAL/PLATELET
Basophils Absolute: 0 10*3/uL (ref 0.0–0.1)
Basophils Relative: 0.3 % (ref 0.0–3.0)
Eosinophils Absolute: 0 10*3/uL (ref 0.0–0.7)
Eosinophils Relative: 0.8 % (ref 0.0–5.0)
HCT: 39 % (ref 36.0–46.0)
Hemoglobin: 13.1 g/dL (ref 12.0–15.0)
Lymphocytes Relative: 34.7 % (ref 12.0–46.0)
Lymphs Abs: 1.4 10*3/uL (ref 0.7–4.0)
MCHC: 33.6 g/dL (ref 30.0–36.0)
MCV: 92.9 fL (ref 78.0–100.0)
Monocytes Absolute: 0.5 10*3/uL (ref 0.1–1.0)
Monocytes Relative: 12 % (ref 3.0–12.0)
Neutro Abs: 2.1 10*3/uL (ref 1.4–7.7)
Neutrophils Relative %: 52.2 % (ref 43.0–77.0)
Platelets: 237 10*3/uL (ref 150.0–400.0)
RBC: 4.2 Mil/uL (ref 3.87–5.11)
RDW: 14.8 % (ref 11.5–15.5)
WBC: 4 10*3/uL (ref 4.0–10.5)

## 2023-08-17 LAB — COMPREHENSIVE METABOLIC PANEL
ALT: 13 U/L (ref 0–35)
AST: 22 U/L (ref 0–37)
Albumin: 4.4 g/dL (ref 3.5–5.2)
Alkaline Phosphatase: 51 U/L (ref 39–117)
BUN: 20 mg/dL (ref 6–23)
CO2: 31 meq/L (ref 19–32)
Calcium: 9.5 mg/dL (ref 8.4–10.5)
Chloride: 102 meq/L (ref 96–112)
Creatinine, Ser: 1.02 mg/dL (ref 0.40–1.20)
GFR: 58.98 mL/min — ABNORMAL LOW (ref >60.00)
Glucose, Bld: 115 mg/dL — ABNORMAL HIGH (ref 70–99)
Potassium: 4.6 meq/L (ref 3.5–5.1)
Sodium: 138 meq/L (ref 135–145)
Total Bilirubin: 0.5 mg/dL (ref 0.2–1.2)
Total Protein: 7.1 g/dL (ref 6.0–8.3)

## 2023-08-22 LAB — QUANTIFERON-TB GOLD PLUS
Mitogen-NIL: 3.78 [IU]/mL
NIL: 0.03 [IU]/mL
QuantiFERON-TB Gold Plus: NEGATIVE
TB1-NIL: 0 [IU]/mL
TB2-NIL: 0 [IU]/mL

## 2023-08-23 ENCOUNTER — Other Ambulatory Visit (HOSPITAL_COMMUNITY): Payer: Self-pay

## 2023-08-23 NOTE — Progress Notes (Signed)
Specialty Pharmacy Refill Coordination Note  Lauren [name] is a 62 y.o. female contacted today regarding refills of specialty medication(s) Upadacitinib (Rinvoq)   Patient requested [name] at [facility] at [place] date: 09/04/23   Medication will be filled on 09/02/22.

## 2023-08-24 ENCOUNTER — Other Ambulatory Visit: Payer: Self-pay

## 2023-08-24 ENCOUNTER — Other Ambulatory Visit: Payer: BC Managed Care – PPO

## 2023-08-24 DIAGNOSIS — K51019 Ulcerative (chronic) pancolitis with unspecified complications: Secondary | ICD-10-CM

## 2023-08-24 DIAGNOSIS — Z79899 Other long term (current) drug therapy: Secondary | ICD-10-CM

## 2023-08-30 LAB — CALPROTECTIN, FECAL: Calprotectin, Fecal: 7 ug/g (ref 0–120)

## 2023-09-03 ENCOUNTER — Other Ambulatory Visit: Payer: Self-pay

## 2023-09-04 ENCOUNTER — Other Ambulatory Visit: Payer: Self-pay

## 2023-09-25 ENCOUNTER — Other Ambulatory Visit: Payer: Self-pay

## 2023-09-25 NOTE — Progress Notes (Signed)
Specialty Pharmacy Refill Coordination Note  Lauren [name] is a 63 y.o. female contacted today regarding refills of specialty medication(s) Upadacitinib (Rinvoq)   Patient requested [name] at [facility] at [place] date: 10/05/23   Medication will be filled on 10/04/23.

## 2023-10-04 ENCOUNTER — Other Ambulatory Visit: Payer: Self-pay

## 2023-10-15 ENCOUNTER — Encounter: Payer: Self-pay | Admitting: Gastroenterology

## 2023-10-29 ENCOUNTER — Ambulatory Visit (HOSPITAL_COMMUNITY)
Admission: RE | Admit: 2023-10-29 | Discharge: 2023-10-29 | Disposition: A | Discharge status: Home / Self Care | Payer: BC Managed Care – PPO | Source: Ambulatory Visit | Attending: Urology | Admitting: Urology

## 2023-10-29 DIAGNOSIS — D179 Benign lipomatous neoplasm, unspecified: Secondary | ICD-10-CM | POA: Diagnosis present

## 2023-10-29 DIAGNOSIS — H269 Unspecified cataract: Secondary | ICD-10-CM | POA: Insufficient documentation

## 2023-10-30 ENCOUNTER — Other Ambulatory Visit (HOSPITAL_COMMUNITY): Payer: Self-pay

## 2023-11-02 ENCOUNTER — Other Ambulatory Visit (HOSPITAL_COMMUNITY): Payer: Self-pay

## 2023-11-02 ENCOUNTER — Other Ambulatory Visit: Payer: Self-pay

## 2023-11-02 NOTE — Progress Notes (Signed)
 Specialty Pharmacy Refill Coordination Note  Lauren [name] is a 63 y.o. female contacted today regarding refills of specialty medication(s) Upadacitinib (Rinvoq)   Patient requested [name] at [facility] at [place] date: 11/06/23   Medication will be filled on 11/05/23.

## 2023-11-05 ENCOUNTER — Ambulatory Visit (INDEPENDENT_AMBULATORY_CARE_PROVIDER_SITE_OTHER): Payer: BC Managed Care – PPO | Admitting: Urology

## 2023-11-05 VITALS — BP 129/79 | HR 54

## 2023-11-05 DIAGNOSIS — D179 Benign lipomatous neoplasm, unspecified: Secondary | ICD-10-CM

## 2023-11-05 DIAGNOSIS — R3129 Other microscopic hematuria: Secondary | ICD-10-CM

## 2023-11-05 NOTE — Progress Notes (Unsigned)
 11/05/2023 3:10 PM   Lauren [name] [birth year] [id number]  Referring provider: Tracey Harries, MD [address] [place],  [place] [address]  No chief complaint on file.   HPI: [name] is a 62yo here for followup for renal AMLs. Renal US 10/29/2023 shows stable left renal AMLs. She has a hx of meningioma and is being worked up for a recurrance   PMH: Past Medical History:  Diagnosis Date   Allergy    Anemia    Anxiety    Cellulitis of left breast 09/17/2015   diagnosed at Urgent Care- on ABX   Clostridium difficile diarrhea    Clotting disorder (HCC)    Depression    takes Prozac daily   Hearing loss    right ear   History of colon polyps    benign   Hyperlipidemia    Hypertension    takes Metoprolol daily   Hypokalemia    takes Potassium every other day   Inflammatory polyps of colon (HCC)    Internal hemorrhoids    Mucosal abnormality of stomach    Osteopenia    Osteoporosis    Personal history of meningioma of the brain    Platelet disorder (HCC)    Pneumonia    hx of-about 4+ yrs ago   Seasonal allergies    takes Allegra as needed and Flonase daily   SVT (supraventricular tachycardia) (HCC)    Tennis elbow    Ulcerative colitis    On Entyvio q 4 week dosing   Urinary urgency    Vitamin B12 deficiency    Von Willebrands disease (HCC)     Surgical History: Past Surgical History:  Procedure Laterality Date   BAHA REVISION Right 03/15/2016   Procedure: REVISION RIGHT BAHA HEARING IMPLANT ;  Surgeon: Lauren Barrios, MD;  Location: [facility] OR;  Service: ENT;  Laterality: Right;   BIOPSY  01/24/2019   Procedure: BIOPSY;  Surgeon: Lauren Deeds, MD;  Location: [facility];  Service: Gastroenterology;;   BIOPSY  01/15/2020   Procedure: BIOPSY;  Surgeon: Lauren Deeds, MD;  Location: [facility];  Service: Gastroenterology;;   BIOPSY  03/22/2021   Procedure: BIOPSY;  Surgeon: Lauren Deeds, MD;  Location: [facility];  Service:  Gastroenterology;;   BIOPSY  05/04/2022   Procedure: BIOPSY;  Surgeon: Lauren Deeds, MD;  Location: [facility];  Service: Gastroenterology;;   BRAIN MENINGIOMA EXCISION  05/2007   CARDIAC ELECTROPHYSIOLOGY STUDY AND ABLATION  10/22/09   COLONOSCOPY N/A 07/29/2013   Procedure: COLONOSCOPY;  Surgeon: Lauren Carwin, MD;  Location: [facility];  Service: Endoscopy;  Laterality: N/A;   COLONOSCOPY WITH PROPOFOL N/A 09/21/2015   Procedure: COLONOSCOPY WITH PROPOFOL;  Surgeon: Lauren Frederick, MD;  Location: [facility];  Service: Gastroenterology;  Laterality: N/A;   COLONOSCOPY WITH PROPOFOL N/A 10/30/2017   Procedure: COLONOSCOPY WITH PROPOFOL;  Surgeon: Lauren Deeds, MD;  [facility];  Service: Gastroenterology;  Laterality: N/A;   COLONOSCOPY WITH PROPOFOL N/A 01/24/2019   Procedure: COLONOSCOPY WITH PROPOFOL;  Surgeon: Lauren Deeds, MD;  [facility]: [facility];  Service: Gastroenterology;  Laterality: N/A;   COLONOSCOPY WITH PROPOFOL N/A 01/15/2020   Procedure: COLONOSCOPY WITH PROPOFOL;  Surgeon: Lauren Deeds, MD;  Location: [facility];  Service: Gastroenterology;  Laterality: N/A;  needs DDAVP 30-60 minutes prior to procedure.   COLONOSCOPY WITH PROPOFOL N/A 03/22/2021   Procedure: COLONOSCOPY WITH PROPOFOL;  Surgeon: Lauren Deeds, MD;  [facility];  Service: Gastroenterology;  Laterality: N/A;   COLONOSCOPY WITH PROPOFOL N/A 05/04/2022   Procedure: COLONOSCOPY WITH PROPOFOL;  Surgeon: Lauren Deeds, MD;  [facility];  Service: Gastroenterology;  Laterality: N/A;   ESOPHAGOGASTRODUODENOSCOPY     HEMOSTASIS CLIP PLACEMENT  03/22/2021   Procedure: HEMOSTASIS CLIP PLACEMENT;  Surgeon: Lauren Deeds, MD;  [facility];  Service: Gastroenterology;;   IMPLANTATION BONE ANCHORED HEARING AID Right    POLYPECTOMY  01/24/2019   Procedure: POLYPECTOMY;  Surgeon: Lauren Deeds, MD;  [facility];  Service: Gastroenterology;;   POLYPECTOMY  01/15/2020   Procedure: POLYPECTOMY;  Surgeon: Lauren Deeds, MD;  [facility];  Service: Gastroenterology;;   POLYPECTOMY  03/22/2021   Procedure: POLYPECTOMY;  Surgeon: Lauren Deeds, MD;  [facility];  Service: Gastroenterology;;   WISDOM TOOTH EXTRACTION      Home Medications:  Allergies as of 11/05/2023       Reactions   Atenolol Swelling   Nsaids Other (See Comments)   PLATELET DISORDER  NSAIDS contraindicated with bleeding disorder. NSAIDS contraindicated with bleeding disorder.   Sulfamethoxazole Other (See Comments), Rash   "redness"   Asa [aspirin] Other (See Comments)   Platelet disorder   Cephalexin Other (See Comments)   Upset her colitis   Corn Starch Other (See Comments)   Gi-upset Allergy test   Ibuprofen Other (See Comments)   Platelet disrder   Mercaptopurine Hives, Itching   Elevated liver levels   Soy Allergy (obsolete) Other (See Comments)   GI symptoms         Medication List        Accurate as of November 05, 2023  3:10 PM. If you have any questions, ask your nurse or doctor.          clotrimazole-betamethasone cream Commonly known as: LOTRISONE Apply 1 application topically daily as needed (Eczema or inflamed in skull).   FLUoxetine 20 MG capsule Commonly known as: PROZAC Take 1 capsule (20 mg total) by mouth daily. What changed: Another medication with the same name was removed. Continue taking this medication, and follow the directions you see here.   hydrocortisone 2.5 % ointment Apply 1 application topically 2 (two) times daily as needed (for eczema).   metoprolol succinate 50 MG 24 hr tablet Commonly known as: TOPROL-XL Take 1 tablet (50 mg total) by mouth daily. What changed: Another medication with the same name was removed. Continue taking this medication, and follow the directions you see here.   mometasone 0.1 % cream Commonly known as:  ELOCON Apply a small amount to affected area twice a day   NON FORMULARY Take 1 tablet by mouth daily. Terrazyme (Essential Oil) Digestive enzyme   OVER THE COUNTER MEDICATION Take 1 capsule by mouth daily. Doterra  Micro plex Vmz   OVER THE COUNTER MEDICATION Take 1 tablet by mouth daily. Doterra Dr-prime culture complex   Rinvoq 30 MG Tb24 Generic drug: Upadacitinib ER Take 1 tablet (30 mg total) by mouth daily.   tranexamic acid 650 MG Tabs tablet Commonly known as: LYSTEDA Take 1,300 mg by mouth 3 (three) times daily as needed (FOR INTERNAL PROCEDURE TO STOP BLEEDING). "For internal procedure to stop bleeding   Vitamin D3 125 MCG (5000 UT) Caps Take 5,000 Units by mouth daily.        Allergies:  Allergies  Allergen Reactions   Atenolol Swelling   Nsaids Other (See Comments)    PLATELET DISORDER  NSAIDS contraindicated with  bleeding disorder.  NSAIDS contraindicated with bleeding disorder.   Sulfamethoxazole Other (See Comments) and Rash    "redness"   Asa [Aspirin] Other (See Comments)    Platelet disorder    Cephalexin Other (See Comments)    Upset her colitis    Corn Starch Other (See Comments)    Gi-upset  Allergy test   Ibuprofen Other (See Comments)    Platelet disrder   Mercaptopurine Hives and Itching    Elevated liver levels   Soy Allergy (Obsolete) Other (See Comments)    GI symptoms     Family History: Family History  Problem Relation Age of Onset   Heart disease Mother    Stroke Mother    Diabetes Maternal Uncle    Heart disease Maternal Grandfather    Breast cancer Paternal Grandmother    Colon cancer Neg Hx    Esophageal cancer Neg Hx    Rectal cancer Neg Hx    Stomach cancer Neg Hx     Social History:  reports that she has never smoked. She has never been exposed to tobacco smoke. She has never used smokeless tobacco. She reports that she does not drink alcohol and does not use drugs.  ROS: All other review of systems were  reviewed and are negative except what is noted above in HPI  Physical Exam: BP 129/79   Pulse (!) 54   LMP 11/26/2008   Constitutional:  Alert and oriented, No acute distress. HEENT: [place] AT, moist mucus membranes.  Trachea midline, no masses. Cardiovascular: No clubbing, cyanosis, or edema. Respiratory: Normal respiratory effort, no increased work of breathing. GI: Abdomen is soft, nontender, nondistended, no abdominal masses GU: No CVA tenderness.  Lymph: No cervical or inguinal lymphadenopathy. Skin: No rashes, bruises or suspicious lesions. Neurologic: Grossly intact, no focal deficits, moving all 4 extremities. Psychiatric: Normal mood and affect.  Laboratory Data: Lab Results  Component Value Date   WBC 4.0 08/17/2023   HGB 13.1 08/17/2023   HCT 39.0 08/17/2023   MCV 92.9 08/17/2023   PLT 237.0 08/17/2023    Lab Results  Component Value Date   CREATININE 1.02 08/17/2023    No results found for: "PSA"  No results found for: "TESTOSTERONE"  Lab Results  Component Value Date   HGBA1C 5.7 09/03/2009    Urinalysis    Component Value Date/Time   COLORURINE LT. YELLOW 07/31/2013 0811   APPEARANCEUR Clear 05/07/2023 1540   LABSPEC 1.015 07/31/2013 0811   PHURINE 6.0 07/31/2013 0811   GLUCOSEU Negative 05/07/2023 1540   GLUCOSEU NEGATIVE 07/31/2013 0811   HGBUR Moderate 07/31/2013 0811   BILIRUBINUR Negative 05/07/2023 1540   KETONESUR negative 09/26/2016 1814   KETONESUR NEGATIVE 07/31/2013 0811   PROTEINUR Negative 05/07/2023 1540   PROTEINUR NEGATIVE 06/24/2007 1621   UROBILINOGEN 0.2 09/26/2016 1814   UROBILINOGEN 0.2 07/31/2013 0811   NITRITE Negative 05/07/2023 1540   NITRITE NEGATIVE 07/31/2013 0811   LEUKOCYTESUR Negative 05/07/2023 1540    Lab Results  Component Value Date   LABMICR See below: 05/07/2023   WBCUA None seen 05/07/2023   LABEPIT 0-10 05/07/2023   MUCUS neg 12/31/2014   BACTERIA None seen 05/07/2023    Pertinent  Imaging: *** No results found for this or any previous visit.  No results found for this or any previous visit.  No results found for this or any previous visit.  No results found for this or any previous visit.  Results for orders placed during the hospital encounter of  05/01/23  Ultrasound renal complete  Narrative CLINICAL DATA:  Follow-up left renal mass.  EXAM: RENAL / URINARY TRACT ULTRASOUND COMPLETE  COMPARISON:  Multiple prior renal ultrasounds, most recent dated 11/01/2022, dating back to 02/20/2011. CT, 05/13/2019.  FINDINGS: Right Kidney:  Renal measurements: 7.6 x 4.2 x 5.7 cm = volume: 148 mL. Echogenicity within normal limits. No mass or hydronephrosis visualized.  Left Kidney:  Renal measurements: 10.4 x 4.4 x 4.7 cm = volume: 114 mL. Normal parenchymal echogenicity. Small homogeneous well-defined echogenic mass along the cortex of the midpole measuring 8 x 7 x 7 mm. Second similar appearing mass along the cortex of the lower pole measuring 9 x 6 x 7 mm. Third and smallest hyperechoic circumscribed mass arises from the upper pole measuring 4 mm. No other masses, no stones and no hydronephrosis.  Bladder:  Appears normal for degree of bladder distention.  Other:  None.  IMPRESSION: 1. No acute findings.  No hydronephrosis. 2. Three similar appearing hyperechoic homogeneous and circumscribed left renal masses as detailed midpole mass relatively stable from the more recent prior exams. Smallest upper pole mass is stable from the most recent prior study. 9 mm lower pole mass seen on the current exam was not seen on prior ultrasounds. These are all consistent with angiomyolipomas. However, given that the lower pole mass is not seen previously, additional follow-up imaging is recommended. Recommend either repeat ultrasound in 6 months or follow-up renal protocol CT scan without and with contrast or renal MRI without and with  contrast.   Electronically Signed By: Amie Portland M.D. On: 05/09/2023 15:16  No results found for this or any previous visit.  No results found for this or any previous visit.  No results found for this or any previous visit.   Assessment & Plan:    1. Angiomyolipoma (Primary) *** - Urinalysis, Routine w reflex microscopic  2. Microscopic hematuria ***   No follow-ups on file.  Wilkie Aye, MD  [facility] [place]

## 2023-11-06 ENCOUNTER — Encounter: Payer: Self-pay | Admitting: Urology

## 2023-11-06 LAB — URINALYSIS, ROUTINE W REFLEX MICROSCOPIC
Bilirubin, UA: NEGATIVE
Glucose, UA: NEGATIVE
Ketones, UA: NEGATIVE
Nitrite, UA: NEGATIVE
Protein,UA: NEGATIVE
Specific Gravity, UA: <1.005 — ABNORMAL LOW (ref 1.005–1.030)
Urobilinogen, Ur: 0.2 mg/dL (ref 0.2–1.0)
pH, UA: 7 (ref 5.0–7.5)

## 2023-11-06 LAB — MICROSCOPIC EXAMINATION

## 2023-11-06 NOTE — Patient Instructions (Signed)
 A Growth in the Kidney (Renal Mass): What to Know  A renal mass is an abnormal growth in the kidney. It may be found during an MRI, CT scan, or ultrasound that's done to check for other problems in the belly. Some renal masses are cancerous, or malignant, and can grow or spread quickly. Others are benign, which means they're not cancer. Renal masses include: Tumors. These may be either cancerous or benign. The most common cancerous tumor in adults is called renal cell carcinoma. In children, the most common type is Wilms tumor. The most common kidney tumors that aren't cancer include renal adenomas, oncocytomas, and angiomyolipomas (AML). Cysts. These are pockets of fluid that form on or in the kidney. What are the causes? Certain types of cancers, infections, or injuries can cause a renal mass. It's not always known what causes a cyst to form in or on the kidney. What are the signs or symptoms? Often, a renal mass or kidney cyst doesn't cause any signs or symptoms. How is this diagnosed? Your health care provider may suggest tests to diagnose the cause of your renal mass. These tests may include: A physical exam. Blood tests. Pee (urine) tests. Imaging tests, such as: CT scan. MRI. Ultrasound. Chest X-ray or bone scan. These may be done if a tumor is cancerous to see if the cancer has spread outside the kidney. Biopsy. This is when a small piece of tissue is removed from the renal mass for testing. How is this treated? Treatment is not always needed for a renal mass. Treatment will depend on the cause of the mass and if it's causing any problems or symptoms. For a cancerous renal mass, treatment options may include: Surgery. This is done to remove the tumor and any affected tissue. Chemotherapy. This uses medicines to kill cancer cells. Radiation. High-energy X-rays or gamma rays are used to kill cancer cells. Ablation. This uses extreme hot or cold temperature to kill the cancer  cells. Immunotherapy. Medicines are used to help the body's defense system (immune system) fight the cancer cells. Taking part in clinical trials. This involves trying new or experimental treatments to see if they're effective. Most kidney cysts don't need to be treated. Follow these instructions at home: What you need to do at home will depend on the cause of the mass. Follow the instructions that your provider gives you. In general: Take medicines only as told. If you were given antibiotics, take them as told. Do not stop taking them even if you start to feel better. Follow any instructions from your provider about what you can and can't do. Do not smoke, vape, or use nicotine or tobacco. Keep all follow-up visits. Your provider will need to check if your renal mass has changed or grown. Contact a health care provider if: You have flank pain, which is pain in your side or back. You have a fever. You have a loss of appetite. You have pain or swelling in your belly. You lose weight. Get help right away if: Your pain gets worse. There's blood in your pee. You can't pee. This information is not intended to replace advice given to you by your health care provider. Make sure you discuss any questions you have with your health care provider. Document Revised: 02/09/2023 Document Reviewed: 02/09/2023 Elsevier Patient Education  2024 [employer].

## 2023-11-30 ENCOUNTER — Other Ambulatory Visit: Payer: Self-pay

## 2023-11-30 NOTE — Progress Notes (Signed)
 Specialty Pharmacy Refill Coordination Note  Lauren [name] is a 63 y.o. female contacted today regarding refills of specialty medication(s) Upadacitinib (Rinvoq)   Patient requested [name] at [facility] at [place] date: 12/10/23   Medication will be filled on 12/10/23.

## 2023-12-10 ENCOUNTER — Other Ambulatory Visit: Payer: Self-pay

## 2024-01-01 NOTE — Progress Notes (Unsigned)
  Electrophysiology Office Follow up Visit Note:    Date:  01/01/2024   ID:  Lauren [name], DOB 06-16-1961, MRN [id number]  PCP:  Alfredia Ina, MD  [facility] Cardiologist:  None  [facility] Electrophysiologist:  Boyce Byes, MD    Interval History:     Lauren [name] is a 63 y.o. female who presents for a follow up visit.   The patient was last seen by her primary care physician at [facility] on December 11, 2023.  She was previously seen by Dr. Rodolfo Clan for SVT.  She was on metoprolol  following a catheter ablation.  Given bradycardia, her primary care recommended repeat evaluation by cardiology.  Her ablation was October 22, 2009.  Her heart rate the day of her primary care evaluation was 54 bpm.  She reports intermittent fatigue.  She is unsure whether or not this is related to her heart rates.  No syncope or presyncope.  She is being worked up for meningioma.  She also has some masses on the kidney.      Past medical, surgical, social and family history were reviewed.  ROS:   Please see the history of present illness.    All other systems reviewed and are negative.  EKGs/Labs/Other Studies Reviewed:    The following studies were reviewed today:  April 08, 2021 EKG shows sinus rhythm.  No preexcitation.        Physical Exam:    VS:  LMP 11/26/2008     Wt Readings from Last 3 Encounters:  07/04/23 163 lb (73.9 kg)  12/12/22 159 lb (72.1 kg)  11/08/22 145 lb (65.8 kg)     GEN: no distress CARD: RRR, No MRG RESP: No IWOB. CTAB.      ASSESSMENT:    No diagnosis found. PLAN:    In order of problems listed above:  #Sinus node dysfunction #Bradycardia I do not think her heart rate prohibits use of beta-blockers.  I would recommend continuing metoprolol  but reduce the dose to 25 mg by mouth once daily.  If she wants to stop in January she can.  She should just let us  know via MyChart message.  #History of SVT Post ablation with Dr. Rodolfo Clan in 2011.  No  recurrence.  Follow-up with EP on an as-needed basis.     Signed, Harvie Liner, MD, [facility], [facility] 01/01/2024 8:33 PM    Electrophysiology [facility]

## 2024-01-02 ENCOUNTER — Other Ambulatory Visit (HOSPITAL_COMMUNITY): Payer: Self-pay

## 2024-01-02 ENCOUNTER — Ambulatory Visit: Attending: Cardiology | Admitting: Cardiology

## 2024-01-02 ENCOUNTER — Other Ambulatory Visit: Payer: Self-pay

## 2024-01-02 ENCOUNTER — Encounter: Payer: Self-pay | Admitting: Cardiology

## 2024-01-02 VITALS — BP 124/80 | HR 48 | Ht 59.0 in | Wt 167.0 lb

## 2024-01-02 DIAGNOSIS — R001 Bradycardia, unspecified: Secondary | ICD-10-CM

## 2024-01-02 DIAGNOSIS — I471 Supraventricular tachycardia, unspecified: Secondary | ICD-10-CM

## 2024-01-02 MED ORDER — METOPROLOL SUCCINATE ER 25 MG PO TB24
25.0000 mg | ORAL_TABLET | Freq: Every day | ORAL | 3 refills | Status: AC
  Filled 2024-01-02: qty 90, 90d supply, fill #0
  Filled 2024-04-02: qty 90, 90d supply, fill #1
  Filled 2024-07-01: qty 90, 90d supply, fill #2
  Filled 2024-09-29: qty 90, 90d supply, fill #3

## 2024-01-02 NOTE — Patient Instructions (Addendum)
 Medication Instructions:  Your physician has recommended you make the following change in your medication:  1) DECREASE metoprolol  to 25 mg daily  *If you need a refill on your cardiac medications before your next appointment, please call your pharmacy*    Follow-Up: At [facility], you and your health needs are our priority.  As part of our continuing mission to provide you with exceptional heart care, our providers are all part of one team.  This team includes your primary Cardiologist (physician) and Advanced Practice Providers or APPs (Physician Assistants and Nurse Practitioners) who all work together to provide you with the care you need, when you need it.  As needed with Dr. Marven Slimmer

## 2024-01-04 ENCOUNTER — Other Ambulatory Visit (HOSPITAL_COMMUNITY): Payer: Self-pay

## 2024-01-07 ENCOUNTER — Other Ambulatory Visit: Payer: Self-pay | Admitting: Pharmacy Technician

## 2024-01-07 ENCOUNTER — Other Ambulatory Visit: Payer: Self-pay

## 2024-01-07 NOTE — Progress Notes (Signed)
 Specialty Pharmacy Refill Coordination Note  Lauren [name] is a 63 y.o. female contacted today regarding refills of specialty medication(s) Upadacitinib  (Rinvoq )   Patient requested [name] at [facility] at [place] date: 01/08/24   Medication will be filled on 01/07/24.

## 2024-01-08 ENCOUNTER — Ambulatory Visit: Admitting: Gastroenterology

## 2024-01-09 ENCOUNTER — Ambulatory Visit: Admitting: Nurse Practitioner

## 2024-01-12 ENCOUNTER — Other Ambulatory Visit (HOSPITAL_COMMUNITY): Payer: Self-pay

## 2024-01-24 DIAGNOSIS — R001 Bradycardia, unspecified: Secondary | ICD-10-CM | POA: Insufficient documentation

## 2024-02-05 ENCOUNTER — Other Ambulatory Visit: Payer: Self-pay

## 2024-02-05 NOTE — Progress Notes (Signed)
 Specialty Pharmacy Ongoing Clinical Assessment Note  Lauren [name] is a 63 y.o. female who is being followed by the specialty pharmacy service for RxSp Ulcerative Colitis   Patient's specialty medication(s) reviewed today: Upadacitinib  (Rinvoq )   Missed doses in the last 4 weeks: 0   Patient/Caregiver did not have any additional questions or concerns.   Therapeutic benefit summary: Patient is achieving benefit   Adverse events/side effects summary: No adverse events/side effects   Patient's therapy is appropriate to: Continue    Goals Addressed             This Visit's Progress    Reduce inflammation       Patient is on track. Patient will maintain adherence. Patient reports that her symptoms are better controlled than they have been in 15 years.          Follow up: 12 months  Rena Carnes Specialty Pharmacist

## 2024-02-05 NOTE — Progress Notes (Signed)
 Specialty Pharmacy Refill Coordination Note  Lauren [name] is a 63 y.o. female contacted today regarding refills of specialty medication(s) Upadacitinib  (Rinvoq )   Patient requested [name] at [facility] at [place] date: 02/06/24   Medication will be filled on 06.10.25.

## 2024-02-08 ENCOUNTER — Ambulatory Visit (INDEPENDENT_AMBULATORY_CARE_PROVIDER_SITE_OTHER): Admitting: Gastroenterology

## 2024-02-08 ENCOUNTER — Encounter: Payer: Self-pay | Admitting: Gastroenterology

## 2024-02-08 ENCOUNTER — Telehealth: Payer: Self-pay

## 2024-02-08 VITALS — BP 122/80 | HR 59 | Ht 59.0 in | Wt 165.4 lb

## 2024-02-08 DIAGNOSIS — E7800 Pure hypercholesterolemia, unspecified: Secondary | ICD-10-CM | POA: Diagnosis not present

## 2024-02-08 DIAGNOSIS — K51019 Ulcerative (chronic) pancolitis with unspecified complications: Secondary | ICD-10-CM | POA: Diagnosis not present

## 2024-02-08 DIAGNOSIS — Z79899 Other long term (current) drug therapy: Secondary | ICD-10-CM | POA: Diagnosis not present

## 2024-02-08 NOTE — Telephone Encounter (Signed)
 Letter faxed to to Dr. Leveda Rea at [facility] [phone]), requesting hemostasis recommendations for colonoscopy procedure scheduled with Dr. General Kenner on Thursday, 04-10-24 at [facility]:  February 08, 2024   Dr. Loyde Rule [place] [address] [facility] Hematology [address]  [place], [place]  [address]   RE: Lauren [name] DOB: [birth year]          Dear Dr. Leveda Rea:   Please be advised that we have scheduled the above referenced patient, who has von Willebrand disease, for a colonoscopy procedure with Dr. Alvester Johnson on April 10, 2024 at [facility].  Please provide your hemostasis recommendations for the procedure to fax number [phone], Attn: Errol Heaps, CMA. Should you need to contact us , our number is [phone].   Thank you for your help,       Errol Heaps, CMA for Dr. Alvester Johnson [facility]

## 2024-02-08 NOTE — Patient Instructions (Addendum)
 Continue Rinvoq .  We will get you scheduled for a colonoscopy at the hospital and contact [facility] for orders for DDAVP  for procedure. (Scheduled for 8-14 at 9:30 am).  Thank you for entrusting me with your care and for choosing [employer], Dr. Alvester Johnson     If your blood pressure at your visit was 140/90 or greater, please contact your primary care physician to follow up on this. ______________________________________________________  If you are age 10 or older, your body mass index should be between 23-30. Your Body mass index is 33.4 kg/m. If this is out of the aforementioned range listed, please consider follow up with your Primary Care Provider.  If you are age 1 or younger, your body mass index should be between 19-25. Your Body mass index is 33.4 kg/m. If this is out of the aformentioned range listed, please consider follow up with your Primary Care Provider.  ________________________________________________________  The [facility] providers would like to encourage you to use MYCHART to communicate with providers for non-urgent requests or questions.  Due to long hold times on the telephone, sending your provider a message by [facility] may be a faster and more efficient way to get a response.  Please allow 48 business hours for a response.  Please remember that this is for non-urgent requests.  _______________________________________________________  Due to recent changes in healthcare laws, you may see the results of your imaging and laboratory studies on MyChart before your provider has had a chance to review them.  We understand that in some cases there may be results that are confusing or concerning to you. Not all laboratory results come back in the same time frame and the provider may be waiting for multiple results in order to interpret others.  Please give us  48 hours in order for your provider to thoroughly review all the results before contacting the  office for clarification of your results.

## 2024-02-08 NOTE — Progress Notes (Signed)
 HPI :  Colitis History Initially left sided ulcerative colitis, diagnosed > 15 years ago. On Remicade  in the past for her colitis, which she thinks she did well for a few years. She had a severe MRSA infection of her ear and ultimately Remicade  was held and eventually was stopped given she had done well when it was held. She has never been on methotrexate. She has been on Lialda  for a long time, prior to that Asacol . She has Von Willebrands. She bleeds easily but has never had significant bleeding / hemorrhages in the past. She has had DDAVP  infusions prior to her last colonoscopy or Stilmate nose spray.  Started on Entyvio  April 2018 following a few flares that Spring. Failed Entyvio  2019, started Humira  and Feb / March 2020. Failed Humira  / 6 MP combination therapy 12/2018. Hepatoxicity to thiopurines - levels > 50K. Transitioned to Stelara  monotherapy 02/2019. Now PANCOLONIC colitis. Moderately active disease on every 4 weeks dosing of Stelara  so it was also stopped summer 2022. Noted to have pancolitis at that time. Started Rinvoq  [name] 2023.     SINCE LAST VISIT:   63 year old female here for a follow-up visit for her ulcerative colitis.  I last saw her in November.  She has been maintained on Rinvoq  30 mg daily since last visit.  Recall she had breakthrough symptoms with an elevated fecal calprotectin while she was on 15 mg dosing.  Fortunately she was recaptured with higher dosing and has not had any flares since making that change.  No blood in her stools.  Her bowels are fairly regular, in fact having occasional constipation.  She will have diarrhea or cramps and abdominal pain with bleeding with flares in the past.  We repeated a fecal calprotectin last December and it was 7.  We discussed timing of her next colonoscopy, her last exam was in 04/2022.  She has had colitis for many years dating back to early 2000's.  Recall her exams are done in the hospital due to her bleeding risks in  light of her coagulation disorder.  She is followed by hematology and usually has DDAVP  administered in the preop area at the hospital prior to exams.  She is hoping to do another colonoscopy later this year.  She has had labs monitored by her primary care.  Of note she has had rising LDL on Rinvoq , although her HDL is also quite high.  Unclear if she has had any further testing on particle size of her LDL, I do not see that she is ever had a CTA etc.  She has no cardiovascular disease historically.  She is up-to-date with her vaccinations and QuantiFERON gold as well.      IBD Health Care Maintenance: Annual Flu Vaccine - Date due: 2024 UTD Pneumococcal Vaccine if receiving immunosuppression: - Date PCV 13 - 05/21/2018, PPSV 23 05/01/19 Zoster vaccine 08/15/19, 05/17/19 COVID VACCINE deferred 2024 TB testing if on anti-TNF, yearly - negative 2024 Vitamin D  screening - Date 11/08/20 - level of 49.18 Last Colonoscopy :   Colonoscopy 03/22/21 - The examined portion of the ileum was normal. - Moderately active (Mayo Score 2) ulcerative colitis in the rectum / rectosigmoid colon. Biopsied. - Mild (Mayo Score 1) ulcerative colitis thoughout elsewhere. Biopsied. - One 6 mm polyp in the distal transverse colon, removed with a hot snare. Resected and retrieved. Clips were placed. - One 2 to 3 mm polyp at the recto-sigmoid colon, removed with a cold biopsy forceps. Resected  and retrieved. - The examination was otherwise normal. Unfortunately active inflammation despite Stelara  q 4 weeks dosing. Will discuss options with the patient.     FINAL MICROSCOPIC DIAGNOSIS:   A. COLON, RIGHT, BIOPSY:  - Chronic moderately active colitis.  - No dysplasia or malignancy.   B. COLON, TRANSVERSE, BIOPSY:  - Chronic moderately active colitis.  - No dysplasia or malignancy.   C. COLON, TRANSVERSE, POLYPECTOMY:  - Inflammatory pseudopolyp.  - No dysplasia or malignancy.   D. COLON, LEFT, BIOPSY:  -  Chronic mildly active colitis.  - No dysplasia or malignancy.   E. COLON, RECTOSIGMOID, RECTUM, BIOPSY:  - Chronic moderately active colitis.  - No dysplasia or malignancy.   F. RECTUM, POLYPECTOMY:  - Inflammatory pseudopolyp.  - No dysplasia or malignancy.      Colonoscopy 01/15/20 - The examined portion of the ileum was normal. - One 3 to 4 mm polyp in the sigmoid colon, removed with a cold snare. Resected and retrieved. - One 3 mm polyp at the recto-sigmoid colon, removed with a cold biopsy forceps. Resected and retrieved. - Mild (Mayo Score 1) ulcerative colitis of the distal 20cm, otherwise no other inflammatory changes noted. Overall, significantly improved since the last examination. Biopsied. - Biopsies for surveillance were taken from the right colon, left colon, transverse colon, and Rectum.    Colonoscopy 01/24/2019 - severe left sided inflammation, mild in transverse and right colon, normal ileum. Benign inflammatory polyp of the cecum   Colonoscopy 2014 - active rectal inflammation, inflammatory polyp Colonoscopy 09/21/2015 - overall good control of colitis with small mild area of inflammation in left colon, mild chronic colitis Colonoscopy 10/30/2017 - normal ileum, 10-15cm segment of mild inflammation, otherwise normal - mildly active colitis      Colonoscopy 05/04/2022: - The examined portion of the ileum was normal. - Internal hemorrhoids. - The examination was otherwise normal. - Biopsies were taken with a cold forceps for histology in the rectum, in the sigmoid colon, in the descending colon, in the transverse colon and in the ascending colon. Interval healing of colonic mucosa on Rinvoq , endoscopically appears to be in remission.   FINAL MICROSCOPIC DIAGNOSIS:   A. RIGHT COLON, BIOPSY:  - Chronic inactive colitis  - Negative for granulomas, viral cytopathic effect, dysplasia or  malignancy   B. TRANSVERSE COLON, BIOPSY:  - Chronic inactive colitis  - Negative  for granulomas, viral cytopathic effect, dysplasia or  malignancy   C. LEFT COLON, BIOPSY:  - Chronic inactive colitis with Paneth cell metaplasia  - Negative for granulomas, viral cytopathic effect, dysplasia or  malignancy   D. RECTAL COLON, BIOPSY:  - Chronic inactive colitis with Paneth cell metaplasia  - Negative for granulomas, viral cytopathic effect, dysplasia or  malignancy        Fecal calprotectin 12/14/22 - 668   Fecal calprotectin 04/13/23 - 35  Fecal calprotectin 07/2023 - 7   Past Medical History:  Diagnosis Date   Allergy    Anemia    Anxiety    Cellulitis of left breast 09/17/2015   diagnosed at Urgent Care- on ABX   Clostridium difficile diarrhea    Clotting disorder (HCC)    Depression    takes Prozac  daily   Hearing loss    right ear   History of colon polyps    benign   Hyperlipidemia    Hypertension    takes Metoprolol  daily   Hypokalemia    takes Potassium every other day   Inflammatory  polyps of colon (HCC)    Internal hemorrhoids    Mucosal abnormality of stomach    Osteopenia    Osteoporosis    Personal history of meningioma of the brain    Platelet disorder (HCC)    Pneumonia    hx of-about 4+ yrs ago   Seasonal allergies    takes Allegra as needed and Flonase daily   SVT (supraventricular tachycardia) (HCC)    Tennis elbow    Ulcerative colitis    On Entyvio  q 4 week dosing   Urinary urgency    Vitamin B12 deficiency    Von Willebrands disease (HCC)      Past Surgical History:  Procedure Laterality Date   BAHA REVISION Right 03/15/2016   Procedure: REVISION RIGHT BAHA HEARING IMPLANT ;  Surgeon: Ryland Cozier, MD;  [facility];  Service: ENT;  Laterality: Right;   BIOPSY  01/24/2019   Procedure: BIOPSY;  Surgeon: Ace Holder, MD;  Location: [facility];  Service: Gastroenterology;;   BIOPSY  01/15/2020   Procedure: BIOPSY;  Surgeon: Ace Holder, MD;  Location: [facility];  Service: Gastroenterology;;    BIOPSY  03/22/2021   Procedure: BIOPSY;  Surgeon: Ace Holder, MD;  Location: [facility];  Service: Gastroenterology;;   BIOPSY  05/04/2022   Procedure: BIOPSY;  Surgeon: Ace Holder, MD;  Location: [facility];  Service: Gastroenterology;;   BRAIN MENINGIOMA EXCISION  05/2007   CARDIAC ELECTROPHYSIOLOGY STUDY AND ABLATION  10/22/09   COLONOSCOPY N/A 07/29/2013   Procedure: COLONOSCOPY;  Surgeon: Pietro Bridegroom, MD;  Location: [facility];  Service: Endoscopy;  Laterality: N/A;   COLONOSCOPY WITH PROPOFOL  N/A 09/21/2015   Procedure: COLONOSCOPY WITH PROPOFOL ;  Surgeon: Danette Duos, MD;  Location: [facility];  Service: Gastroenterology;  Laterality: N/A;   COLONOSCOPY WITH PROPOFOL  N/A 10/30/2017   Procedure: COLONOSCOPY WITH PROPOFOL ;  Surgeon: Ace Holder, MD;  Location: [facility];  Service: Gastroenterology;  Laterality: N/A;   COLONOSCOPY WITH PROPOFOL  N/A 01/24/2019   Procedure: COLONOSCOPY WITH PROPOFOL ;  Surgeon: Ace Holder, MD;  Location: [facility];  Service: Gastroenterology;  Laterality: N/A;   COLONOSCOPY WITH PROPOFOL  N/A 01/15/2020   Procedure: COLONOSCOPY WITH PROPOFOL ;  Surgeon: Ace Holder, MD;  Location: [facility];  Service: Gastroenterology;  Laterality: N/A;  needs DDAVP  30-60 minutes prior to procedure.   COLONOSCOPY WITH PROPOFOL  N/A 03/22/2021   Procedure: COLONOSCOPY WITH PROPOFOL ;  Surgeon: Ace Holder, MD;  Location: [facility];  Service: Gastroenterology;  Laterality: N/A;   COLONOSCOPY WITH PROPOFOL  N/A 05/04/2022   Procedure: COLONOSCOPY WITH PROPOFOL ;  Surgeon: Ace Holder, MD;  Location: [facility];  Service: Gastroenterology;  Laterality: N/A;   ESOPHAGOGASTRODUODENOSCOPY     HEMOSTASIS CLIP PLACEMENT  03/22/2021   Procedure: HEMOSTASIS CLIP PLACEMENT;  Surgeon: Ace Holder, MD;  Location: [facility];  Service: Gastroenterology;;   IMPLANTATION BONE ANCHORED HEARING AID Right     POLYPECTOMY  01/24/2019   Procedure: POLYPECTOMY;  Surgeon: Ace Holder, MD;  Location: [facility];  Service: Gastroenterology;;   POLYPECTOMY  01/15/2020   Procedure: POLYPECTOMY;  Surgeon: Ace Holder, MD;  Location: [facility];  Service: Gastroenterology;;   POLYPECTOMY  03/22/2021   Procedure: POLYPECTOMY;  Surgeon: Ace Holder, MD;  Location: [facility];  Service: Gastroenterology;;   WISDOM TOOTH EXTRACTION     Family History  Problem Relation Age of Onset   Heart disease Mother    Stroke Mother    Diabetes  Maternal Uncle    Heart disease Maternal Grandfather    Breast cancer Paternal Grandmother    Colon cancer Neg Hx    Esophageal cancer Neg Hx    Rectal cancer Neg Hx    Stomach cancer Neg Hx    Social History   Tobacco Use   Smoking status: Never    Passive exposure: Never   Smokeless tobacco: Never  Vaping Use   Vaping status: Never Used  Substance Use Topics   Alcohol use: No    Alcohol/week: 0.0 standard drinks of alcohol   Drug use: No   Current Outpatient Medications  Medication Sig Dispense Refill   Cholecalciferol (VITAMIN D3) 125 MCG (5000 UT) CAPS Take 5,000 Units by mouth daily.     clotrimazole-betamethasone (LOTRISONE) cream Apply 1 application topically daily as needed (Eczema or inflamed in skull).     FLUoxetine  (PROZAC ) 20 MG capsule Take 1 capsule (20 mg total) by mouth daily. 90 capsule 3   hydrocortisone  2.5 % ointment Apply 1 application topically 2 (two) times daily as needed (for eczema).     metoprolol  succinate (TOPROL  XL) 25 MG 24 hr tablet Take 1 tablet (25 mg total) by mouth daily. 90 tablet 3   mometasone  (ELOCON ) 0.1 % cream Apply a small amount to affected area twice a day 15 g 0   NON FORMULARY Take 1 tablet by mouth daily. Terrazyme (Essential Oil) Digestive enzyme     OVER THE COUNTER MEDICATION Take 1 capsule by mouth daily. Doterra  Micro plex Vmz     OVER THE COUNTER MEDICATION Take 1 tablet  by mouth daily. Doterra Dr-prime culture complex     tranexamic acid  (LYSTEDA ) 650 MG TABS tablet Take 1,300 mg by mouth 3 (three) times daily as needed (FOR INTERNAL PROCEDURE TO STOP BLEEDING). For internal procedure to stop bleeding     Upadacitinib  ER (RINVOQ ) 30 MG TB24 Take 1 tablet (30 mg total) by mouth daily. 30 tablet 11   Current Facility-Administered Medications  Medication Dose Route Frequency Provider Last Rate Last Admin   desmopressin  (DDAVP ) 23.6 mcg in sodium chloride  0.9 % IV Syringe  0.3 mcg/kg Intravenous Once Rashad Obeid, Lendon Queen, MD       Allergies  Allergen Reactions   Atenolol Swelling   Nsaids Other (See Comments)    PLATELET DISORDER  NSAIDS contraindicated with bleeding disorder.  NSAIDS contraindicated with bleeding disorder.   Sulfamethoxazole  Other (See Comments) and Rash    redness   Asa [Aspirin] Other (See Comments)    Platelet disorder    Cephalexin Other (See Comments)    Upset her colitis    Corn Starch Other (See Comments)    Gi-upset  Allergy test   Ibuprofen Other (See Comments)    Platelet disrder   Mercaptopurine  Hives and Itching    Elevated liver levels   Soy Allergy (Obsolete) Other (See Comments)    GI symptoms      Review of Systems: All systems reviewed and negative except where noted in HPI.   Labs in care everywhere  Physical Exam: BP 122/80 (BP Location: Left Arm, Patient Position: Sitting, Cuff Size: Normal)   Pulse (!) 59   Ht 4' 11 (1.499 m)   Wt 165 lb 6 oz (75 kg)   LMP 11/26/2008   SpO2 (!) 80%   BMI 33.40 kg/m  Constitutional: Pleasant,well-developed, female in no acute distress. Neurological: Alert and oriented to person place and time. Psychiatric: Normal mood and affect. Behavior is normal.  ASSESSMENT: 63 y.o. female here for assessment of the following  1. Ulcerative pancolitis with complication (HCC)   2. High risk medication use   3. Elevated LDL cholesterol level    Longstanding  ulcerative colitis who is failed numerous regimens.  Fortunately she has responded quite well to higher doses of Rinvoq  (initially did well with induction but ultimately failed lower dosing at 15 mg/day).  She has had no flares or recurrent disease on 30 mg daily and clinically doing quite well which is great news.  Fecal calprotectin was entirely normal in December, the best it has been in some time.  We will continue Rinvoq  for now.  We discussed how frequently to perform surveillance colonoscopy given duration of disease and her intermittent significant flaring over time, at risk for dysplastic change.  To do colonoscopy for requires the procedure be done at the hospital for DDAVP  infusions beforehand.  She is comfortable doing this roughly 2 years out from her last exam, we will plan for August September timeframe if that works for her schedule.  She wishes to proceed with this.  Otherwise reviewed her labs, she has had a rise in her LDL over time but also has high HDL.  She is not currently on any therapy.  I wonder if she warrants additional testing for particle size of her LDL and/or cardiac CT to further risk ratified and clarify if she warrants therapy given cardiovascular risks and hyperlipidemia associated with Rinvoq .  I asked her to touch base with her primary care about this.  Otherwise vaccinations and TB testing up-to-date.  She warrants yearly flu shot this fall  PLAN: - continue Rinvoq  30mg  / day, refilled - vaccines UTD, yearly flu shot - follow up with PCP for lipid evaluation as above - colonoscopy at the hospital with hematology recommendations - DDAVP  infusion and transexmic acid surrounding the procedure  Christi Coward, MD [facility] Gastroenterology

## 2024-02-12 NOTE — Telephone Encounter (Signed)
 Called Dr. Loyde Rule De-Ling Ma's office at [facility]  Phone: [phone]. LM for nurse requesting a call back to confirm they got the request.

## 2024-02-13 ENCOUNTER — Other Ambulatory Visit (HOSPITAL_BASED_OUTPATIENT_CLINIC_OR_DEPARTMENT_OTHER): Payer: Self-pay

## 2024-02-13 ENCOUNTER — Other Ambulatory Visit (HOSPITAL_COMMUNITY): Payer: Self-pay

## 2024-02-13 MED ORDER — TRANEXAMIC ACID 650 MG PO TABS
ORAL_TABLET | ORAL | 5 refills | Status: AC
  Filled 2024-02-28: qty 30, 5d supply, fill #0

## 2024-02-13 NOTE — Telephone Encounter (Signed)
 Rec'd fax from [facility] Dr. Loyde Rule Ma's office:  Arlyne Lame, RN - 02/12/2024 11:46 AM EDT Formatting of this note might be different from the original. [facility] - Called pt and reviewed hematology plan of care for pt's colonoscopy scheduled for August 14 at [facility] with Dr. Alvester Johnson. Pt advised POC will be placed in her MyChart for review and sent to GI provider office. Pt wants Rx of TXA sent to [facility]. Advised order will be sent to provider for approval and then to her confirmed pharmacy. Pt verbalized understanding and agreed with plan.  Per Dr. Arch Ko hemostasis recommendations for procedure:  - Tranexamic Acid  1300mg  (2 tablets) by mouth night before and morning of procedure - If biopsies/polyps: continue Tranexamic Acid  1300mg  (2 tablets) by mouth every 8 hours for 5 days  Patient should avoid Aspirin and NSAIDs.  Patient should monitor for bleeding post-discharge and contact both GI and hematology providers.  For emergent bleeding/concerns, please page patient's primary or covering hematologist through the [facility] Operator [phone].  Arlyne Lame, RN February 12, 2024 11:53 AM  Electronically signed by Arlyne Lame, RN at 02/12/2024 12:00 PM EDT

## 2024-02-13 NOTE — Telephone Encounter (Signed)
 Called and spoke to [place], Charity fundraiser at [employer].  She has added a note to patient's case indicating that she will NOT be needing DDAVP  prior to colonoscopy in August. And that patient is aware she will take tranexamic acid  tablets the evening before and morning of the procedure. [facility] is sending the script to patient's pharmacy

## 2024-02-13 NOTE — Telephone Encounter (Signed)
Got it, thanks [name]

## 2024-02-13 NOTE — Telephone Encounter (Signed)
 Called Dr. Althia Atlas office at [phone], #2 and left a detailed message wanting to clarify that the instructions did NOT include orders for DDAVP . Asked them to call back or fax back a confirmation that patient will only need   Tranexamic Acid  1300mg  (2 tablets) by mouth night before and morning of procedure - If biopsies/polyps: continue Tranexamic Acid  1300mg  (2 tablets) by mouth every 8 hours for 5 days   Also called and spoke to patient.  She confirmed they told her she will not be needing DDAVP  prior to the procedure.

## 2024-02-27 ENCOUNTER — Other Ambulatory Visit: Payer: Self-pay

## 2024-02-28 ENCOUNTER — Other Ambulatory Visit (HOSPITAL_COMMUNITY): Payer: Self-pay

## 2024-02-28 ENCOUNTER — Other Ambulatory Visit: Payer: Self-pay

## 2024-03-04 ENCOUNTER — Other Ambulatory Visit (HOSPITAL_COMMUNITY): Payer: Self-pay

## 2024-03-04 ENCOUNTER — Other Ambulatory Visit: Payer: Self-pay

## 2024-03-04 NOTE — Progress Notes (Signed)
 Specialty Pharmacy Refill Coordination Note  Lauren [name] is a 63 y.o. female contacted today regarding refills of specialty medication(s) Upadacitinib  (Rinvoq )   Patient requested [name] at [facility] at [place] date: 03/05/24   Medication will be filled on 03/04/24.

## 2024-03-05 ENCOUNTER — Other Ambulatory Visit (HOSPITAL_COMMUNITY): Payer: Self-pay

## 2024-03-05 MED ORDER — FLUOXETINE HCL 20 MG PO CAPS
20.0000 mg | ORAL_CAPSULE | Freq: Every day | ORAL | 3 refills | Status: AC
  Filled 2024-03-05 – 2024-04-02 (×2): qty 90, 90d supply, fill #0
  Filled 2024-07-01: qty 90, 90d supply, fill #1
  Filled 2024-09-29: qty 90, 90d supply, fill #2

## 2024-03-19 ENCOUNTER — Ambulatory Visit (INDEPENDENT_AMBULATORY_CARE_PROVIDER_SITE_OTHER): Admitting: Medical Genetics

## 2024-03-19 VITALS — BP 118/61 | HR 61 | Wt 167.2 lb

## 2024-03-19 DIAGNOSIS — K5180 Other ulcerative colitis without complications: Secondary | ICD-10-CM | POA: Diagnosis not present

## 2024-03-19 DIAGNOSIS — D320 Benign neoplasm of cerebral meninges: Secondary | ICD-10-CM | POA: Diagnosis not present

## 2024-03-19 DIAGNOSIS — D1771 Benign lipomatous neoplasm of kidney: Secondary | ICD-10-CM

## 2024-03-19 DIAGNOSIS — Z1379 Encounter for other screening for genetic and chromosomal anomalies: Secondary | ICD-10-CM

## 2024-03-19 DIAGNOSIS — Z86011 Personal history of benign neoplasm of the brain: Secondary | ICD-10-CM | POA: Diagnosis not present

## 2024-03-19 DIAGNOSIS — H90A21 Sensorineural hearing loss, unilateral, right ear, with restricted hearing on the contralateral side: Secondary | ICD-10-CM | POA: Diagnosis not present

## 2024-03-19 NOTE — Progress Notes (Signed)
 GENETIC COUNSELING NEW PATIENT EVALUATION Patient name: Lauren [name] DOB: 27-Aug-1961 Age: 63 y.o. MRN: [id number]  Referring Provider/Specialty: Bernita Ned, PA  Date of Evaluation: 03/19/2024 Chief Complaint/Reason for Referral: Angiomyolipoma, meningioma, hearing loss   Brief Summary: Lauren [name] is a 63 y.o. female who presents today for an initial genetics evaluation for angiomyolipoma, meningioma, and hearing loss. She is unaccompanied at today's visit.  Prior genetic testing has been performed. Lauren [name] reports that she has previously had genetic testing at [facility] that confirmed a diagnosis of [place] platelet disorder.  Record of this testing was not found in the medical record.   Family History: See pedigree obtained during today's visit under History->Family->Pedigree.  The family history was notable for the following: Daughter, 91 yo, with microscopic hematuria since 63 yo, panic disorder, and PCOS.  Brother, 25 yo, with speech/hearing concerns in early childhood. His son with autism spectrum disorder. Brother, 58 yo, alive and well. Sister, 73 yo, with bilateral club foot and PCOS.  Paternal Family History Father, deceased at 38 yo, with encephalopathy. Many aunts and uncles, no health information available. Grandfather, deceased at 44 yo, from metastatic brain cancer. Grandmother, deceased at 35 yo, unknown cause of death.  Maternal Family History Mother, deceased at 24 yo, with hypertension, multiple strokes beginning in her 30s, large pink birthmark of the upper arm, and a seizure disorder. 4 uncles, all deceased, with dementia and mental health concerns. Grandfather, deceased at 68 yo, with history of stroke. Grandmother, deceased at 1 yo, with niacin deficiency and manic episodes.  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  Record of this testing was not found in the medical record.  Genetic Counseling: BRITTANYANN WITTNER is a 63 y.o. female with a personal history of angiomyolipoma, meningioma, and hearing loss.  For detailed HPI, please see accompanying note from Dr. [place] Haldeman Englert.  The family history includes the following: Lauren [name] daughter was first diagnosed with micrscopic hematuria at 63 yo with normal work-up, though the microscopic hematuria has persisted into adulthood.  Lauren [name] brother, 79 yo, had speech and/or hearing concerns in early childhood. Her sister was born with bilateral clubfoot. Lauren [name] mother, deceased at 37 yo, had a history of multiple strokes beginning in her late 7s and hypertension, as well as a seizure disorder and large pink birthmark on the arm.  Lauren [name] has 4 uncles, all deceased, with mental health concerns.  Her maternal grandmother had a niacin deficiency and mania.  Lauren [name] paternal grandfather died at 9 yo from metastatic brain cancer.  We discussed the genetic etiology of Lauren [name]'s syptoms, including that we have over 20,000 genes that provide instructions to the body.  Some of these instructions are related to tumor suppression and hearing.  If there is a change to one of these genes, it may cause tumors like meningiomas or angiomyolipomas to develop or hearing loss. If a genetic cause for Lauren [name]'s symptoms can be identified it may impact medical management, prognosis, and recurrence risk.  Specifically, we recommend gene panel testing for hereditary cancer and hearing loss conditions.  Lauren [name] was agreeable to this plan and verbal consent was provided after discussion  of the types of results, expected cost, and timeline.  Buccal  samples were collected from Lauren [name] to be sent to [facility] for the Multi-Cancer Panel and Comprehensive Deafness Panel.  Results are expected in 3-6 weeks, at which point we will reach out with more information.  Recommendations: [facility] Multi-Cancer Panel and Comprehensive Deafness Panel Continue follow-up with other healthcare providers as recommended  Date: 03/19/2024 Total time spent: 80 minutes Genetic Counselor-only time: 35 minutes  Time spent includes face to face and non-face to face care for the patient on the date of this encounter (history, genetic counseling, coordination of care, data gathering and/or documentation as outlined).   Lum Molt MS [facility] Certified Genetic Counselor [facility] [employer]

## 2024-03-19 NOTE — Progress Notes (Signed)
 MEDICAL GENETICS NEW PATIENT EVALUATION  Patient name: Lauren [name] DOB: 09-22-1960 Age: 63 y.o. MRN: [id number]  Referring Provider/Specialty: Bernita Ned, PA  Date of Evaluation: 03/19/2024 Chief Complaint/Reason for Referral: Angiomyolipoma, meningioma  Assessment: We discussed with Lauren [name] that she does not have any other significant clinical features suggestive of tuberous sclerosis. However, the features of this condition can be variable, in which case testing of the TSC1/TSC2 genes would be recommended. Some types of MEN as well as Birt-Hogg-Dube syndrome can have angiomyolipomas as well. Therefore, a larger panel of genes associated with cancer is recommended as this could affect her management and tumor surveillance strategies.   We also discussed that her hearing loss may be congenital, and testing a panel of genes related to deafness is also recommended as this could affect the management of her hearing loss.  Lauren [name] was interested in genetic testing. Verbal consent and samples were obtained and sent to [facility] for multi-cancer and deafness gene panels, with results expected in 1 month. We will contact her with the results when they are available.  Lauren [name] other medical concerns (excluding her previously diagnosed bleeding disorder) do not have associated causes involving rare genetic abnormalities, in which case genetic testing for these conditions is not available or recommended at this time.  Recommendations: Multi-cancer and deafness gene panels through [facility] - results expected in 1 month. Continue follow up with current medical providers per their recommendations.  Follow up will be based on the results of the testing.   HPI: Lauren [name] is a 63 y.o. assigned female at birth who presents today for an initial genetics evaluation for agiomyolipoma and meningioma. She provided the history. This information, along with a review of pertinent records,  labs, and radiology studies, is summarized below.  Lauren [name] was diagnosed with a right cerebellopontine meningioma (resected in 2008) with symptoms of left-sided hearing loss and facial weakness. In 2020, a CT scan of the abdomen showed a left renal tumor, consistent with a lipoma. She has been receiving regular renal ultrasounds, and now has 3 lesions on the left kidney. These are thought to be angiomyolipomas and have been stable over the past few years. She has renal ultrasounds every 6 months to monitor the growth of these tumors. She has fair skin complexion, and is not aware of any hypopigmented areas. She does not know of any patches on her back. She did have a light brown on one of her legs as well as a mole on her abdomen that were present at birth.   Her additional medical concerns are listed below, but include: - ENT: hearing loss in the left likely congenital, right hearing loss from the meningioma, suspected auditory processing disorder - Cardio: arrhythmia (SVT) s/p ablation - GI: ulcerative colitis treated with Renvoq - Neuro: residual right meningioma with slow growth, last MRI in April 2025, recommend next MRI in 5 years - Ortho: osteoporosis - Heme: [place] platelet disorder diagnosed by a hematologist at [facility]  Past Medical History: Patient Active Problem List   Diagnosis Date Noted   History of cerebellar meningioma 03/19/2024   Angiomyolipoma of left kidney 03/19/2024   Cerebellopontine angle meningioma (HCC) 03/19/2024   Bradycardia 01/24/2024   Bilateral cataracts 10/29/2023   Von Willebrand disease (HCC)    Vitamin D  insufficiency 09/26/2016   Microscopic hematuria 09/26/2016   Left sided colitis without complications (HCC)    Benign neoplasm of colon 07/29/2013   [place] platelet disorder (HCC) 07/17/2013  Osteoporosis 04/04/2013   VITAMIN B12 DEFICIENCY 04/05/2010   Sinoatrial node dysfunction (HCC) 10/15/2009   Reactive depression 09/17/2009    HEMORRHOIDS-EXTERNAL 05/18/2009   Status post placement of bone anchored hearing aid (BAHA) 02/27/2008   Anxiety state 02/26/2008   Essential hypertension 02/26/2008   Ulcerative colitis (HCC) 02/26/2008   COUGH, CHRONIC 02/26/2008   Sensorineural hearing loss (SNHL) of right ear with restricted hearing of left ear 06/27/2007   Medications: Current Outpatient Medications on File Prior to Visit  Medication Sig Dispense Refill   Cholecalciferol (VITAMIN D3) 125 MCG (5000 UT) CAPS Take 5,000 Units by mouth daily.     clotrimazole-betamethasone (LOTRISONE) cream Apply 1 application topically daily as needed (Eczema or inflamed in skull).     FLUoxetine  (PROZAC ) 20 MG capsule Take 1 capsule (20 mg total) by mouth daily. 90 capsule 3   hydrocortisone  2.5 % ointment Apply 1 application topically 2 (two) times daily as needed (for eczema).     metoprolol  succinate (TOPROL  XL) 25 MG 24 hr tablet Take 1 tablet (25 mg total) by mouth daily. 90 tablet 3   mometasone  (ELOCON ) 0.1 % cream Apply a small amount to affected area twice a day 15 g 0   NON FORMULARY Take 1 tablet by mouth daily. Terrazyme (Essential Oil) Digestive enzyme     OVER THE COUNTER MEDICATION Take 1 capsule by mouth daily. Doterra  Micro plex Vmz     OVER THE COUNTER MEDICATION Take 1 tablet by mouth daily. Doterra Dr-prime culture complex     tranexamic acid  (LYSTEDA ) 650 MG TABS tablet Take 1,300 mg by mouth 3 (three) times daily as needed (FOR INTERNAL PROCEDURE TO STOP BLEEDING). For internal procedure to stop bleeding     Upadacitinib  ER (RINVOQ ) 30 MG TB24 Take 1 tablet (30 mg total) by mouth daily. 30 tablet 11   Current Facility-Administered Medications on File Prior to Visit  Medication Dose Route Frequency Provider Last Rate Last Admin   desmopressin  (DDAVP ) 23.6 mcg in sodium chloride  0.9 % IV Syringe  0.3 mcg/kg Intravenous Once Armbruster, Elspeth SQUIBB, MD       Allergies:  Allergies  Allergen Reactions   Atenolol  Swelling   Nsaids Other (See Comments)    PLATELET DISORDER  NSAIDS contraindicated with bleeding disorder.  NSAIDS contraindicated with bleeding disorder.   Sulfamethoxazole  Other (See Comments) and Rash    redness   Asa [Aspirin] Other (See Comments)    Platelet disorder    Cephalexin Other (See Comments)    Upset her colitis    Corn Starch Other (See Comments)    Gi-upset  Allergy test   Ibuprofen Other (See Comments)    Platelet disrder   Mercaptopurine  Hives and Itching    Elevated liver levels   Soy Allergy (Obsolete) Other (See Comments)    GI symptoms    Review of Systems: Negative except as noted in the HPI  Family History: The family history was notable for the following: Daughter, 8 yo, with microscopic hematuria since 63 yo, panic disorder, and PCOS.   Brother, 3 yo, with speech/hearing concerns in early childhood. His son with autism spectrum disorder. Brother, 25 yo, alive and well. Sister, 60 yo, with bilateral club foot and PCOS.   Paternal Family History Father, deceased at 19 yo, with encephalopathy. Many aunts and uncles, no health information available. Grandfather, deceased at 54 yo, from metastatic brain cancer. Grandmother, deceased at 19 yo, unknown cause of death.   Maternal Family History Mother,  deceased at 64 yo, with hypertension, multiple strokes beginning in her 30s, large pink birthmark of the upper arm, and a seizure disorder. 4 uncles, all deceased, with dementia and mental health concerns. Grandfather, deceased at 37 yo, with history of stroke. Grandmother, deceased at 56 yo, with niacin deficiency and manic episodes.   Mother's ethnicity: Mixed European  Father's ethnicity: Mixed European Consangunity: possibly distant (findings on [email or web address]) Please see the genetic counselor note for additional information  Social History: Lives with spouse in [place]  Vitals: Weight: 167.2 lb  Genetics Physical  Exam:  Constitution: The patient is active and alert  Head: No abnormalities detected in: head, hairline, shape or size    Anterior fontanelle flat: not flat    Anterior fontanelle open: not open    Bitemporal narrowing: forehead not narrow    Frontal bossing: no frontal bossing    Macrocephaly: not macrocephalic    Microcephaly: not microcephalic    Plagiocephaly: not plagiocephalic  Face: No abnormalities detected in: face, midface or shape    Coarse facial features: no coarse facies    Midfacial hypoplasia: no midfacial hypoplasia  Cardiac: No abnormalities detected in: cardiovascular system    Abnormal distal perfusion: normal distal perfusion    Irregular rate: heart rate regular    Irregular rhythm: regular rhythm    Murmur: no murmur  Lungs: No abnormalities detected in: pulmonary system, bilateral auscultation or effort  Abdomen: No abnormalities detected in: abdomen or appearance    Abnormal umbilicus: normal umbilicus    Diastasis recti: no diastasis recti    Distended abdomen: no distension    Hepatosplenomegaly: no hepatosplenomegaly    Umbilical hernia: no umbilical hernia  Spine: not assessed  Neurological: No abnormalities detected in: neurological system, deep tendon reflexes, antigravity movement of extremities, strength, facial movement or tone    Hypertonia: not hypertonic    Hypotonia: not hypotonic  Genitourinary: not assessed  Hair, Nails, and Skin: (comments: No hypopigmented lesions, no shagreen patch; firm flesh/white nodule on right forehead)  Extremities: No abnormalities detected in: extremities    Asymmetric girth: symmetric girth    Contractures: no joint contractures    Limited range of motion: non-limited ROM  Hands and Feet: No abnormalities detected in: distal extremities    Clinodactyly: no clinodactyly    Polydactyly: no polydactyly    Single palmar crease: multiple palmar creases    Syndactyly: no syndactyly   [place]  Haldeman-Englert, MD [facility] Date: 03/19/2024 Time: 1200   Total time spent: 90 minutes Time spent includes face to face and non-face to face care for the patient on the date of this encounter (history and physical, genetic counseling, coordination of care, data gathering and/or documentation as outlined).  Genetic counselor: Lum Molt, MS, [facility]

## 2024-03-24 ENCOUNTER — Encounter: Payer: Self-pay | Admitting: Gastroenterology

## 2024-03-24 ENCOUNTER — Encounter

## 2024-03-24 ENCOUNTER — Ambulatory Visit (AMBULATORY_SURGERY_CENTER): Admitting: *Deleted

## 2024-03-24 ENCOUNTER — Other Ambulatory Visit (HOSPITAL_COMMUNITY): Payer: Self-pay

## 2024-03-24 VITALS — Ht 59.0 in | Wt 167.0 lb

## 2024-03-24 DIAGNOSIS — K51019 Ulcerative (chronic) pancolitis with unspecified complications: Secondary | ICD-10-CM

## 2024-03-24 DIAGNOSIS — H3552 Pigmentary retinal dystrophy: Secondary | ICD-10-CM

## 2024-03-24 DIAGNOSIS — D6800 Von Willebrand disease, unspecified: Secondary | ICD-10-CM

## 2024-03-24 MED ORDER — SUTAB 1479-225-188 MG PO TABS
ORAL_TABLET | ORAL | 0 refills | Status: DC
  Filled 2024-03-24: qty 24, 2d supply, fill #0

## 2024-03-24 NOTE — Progress Notes (Signed)
 Pt's name and DOB verified at the beginning of the pre-visit wit 2 identifiers  Pt denies any difficulty with ambulating,sitting, laying down or rolling side to side  Pt has no issues moving head neck or swallowing  No egg or soy allergy known to patient   No issues known to pt with past sedation with any surgeries or  No FH of Malignant Hyperthermia  Pt is not on home 02   Pt is not on blood thinners   Pt denies issues with constipation   Pt is not on dialysis  HX of SVT current Bradycardia  Patient's chart reviewed by Norleen Schillings CNRA prior to pre-visit and patient appropriate for the [facility].  Pre-visit completed and red dot placed by patient's name on their procedure day (on provider's schedule).    Visit by phone  Pt states weight is 167 lb   IInstructions reviewed. Pt given  both [facility] main # and MD on call # prior to instructions.  Pt states understanding of instructions. Instructed pt to review instructions again prior to procedure and call main # given if has questions.. Pt states they will.   Instructed pt on where to find instructions on My Chart.

## 2024-03-27 ENCOUNTER — Telehealth: Payer: Self-pay | Admitting: *Deleted

## 2024-03-27 ENCOUNTER — Telehealth: Payer: Self-pay | Admitting: Gastroenterology

## 2024-03-27 NOTE — Telephone Encounter (Signed)
 Spoke with pt and RN to call back later to facilitate further assistance

## 2024-03-27 NOTE — Telephone Encounter (Signed)
      Lela Bough, RN Registered Nurse   Telephone Encounter    Signed   Encounter Date: 02/12/2024 Note Received: 03/27/2024 11:52 AM   Signed     [facility] - Called pt and reviewed hematology plan of care for pt's colonoscopy scheduled for August 14 at [facility] with Dr. Elspeth Naval. Pt advised POC will be placed in her MyChart for review and sent to GI provider office. Pt wants Rx of TXA sent to [facility]. Advised order will be sent to provider for approval and then to her confirmed pharmacy. Pt verbalized understanding and agreed with plan.    Per Dr. Sharyne Killian hemostasis recommendations for procedure:   -  Tranexamic Acid  1300mg  (2 tablets) by mouth night before and morning of procedure - If biopsies/polyps: continue Tranexamic Acid  1300mg  (2 tablets) by mouth every 8 hours for 5 days   Patient should avoid Aspirin and NSAIDs.   Patient should monitor for bleeding post-discharge and contact both GI and hematology providers.   For emergent bleeding/concerns, please page patient's primary or covering hematologist through the [facility] Operator [phone].     Lela Bough, RN February 12, 2024 11:53 AM

## 2024-03-27 NOTE — Telephone Encounter (Signed)
 Patient requesting f/u call in regards to procedure. Please advise.

## 2024-03-27 NOTE — Telephone Encounter (Signed)
 Reviewed instructions fpr Tranexanmic medications from MD She wanted to clarify that she is allergic to Sulfa  and has never taken Iron that she recalls in her life.  RN reiterated that Hospital RN's will review meds with her as well for further instructions.  Pt can not drink Gatoraide type drinks as well. Updated instructions to be made and resent

## 2024-04-02 ENCOUNTER — Other Ambulatory Visit: Payer: Self-pay | Admitting: Gastroenterology

## 2024-04-02 ENCOUNTER — Telehealth: Payer: Self-pay | Admitting: Gastroenterology

## 2024-04-02 ENCOUNTER — Other Ambulatory Visit (HOSPITAL_COMMUNITY): Payer: Self-pay

## 2024-04-02 ENCOUNTER — Other Ambulatory Visit: Payer: Self-pay

## 2024-04-02 NOTE — Telephone Encounter (Signed)
 Patient returning call.

## 2024-04-02 NOTE — Telephone Encounter (Signed)
 Talked to patient and she is wanting to speak to Orie Chihuahua in pre-visit. About prep instructions

## 2024-04-02 NOTE — Telephone Encounter (Signed)
 Returned the patient's called and left a message for her to return my call.

## 2024-04-02 NOTE — Telephone Encounter (Addendum)
 Procedure:Colonoscopy Procedure date: 04/10/24 Procedure location: [facility] Arrival Time: 8:00 am Spoke with the patient Y/N:   No, I left a detailed message on [phone] on 04/02/24 @ 10:15 am for the patient to return call Yes, 04/02/24 @ 11:54 am   Any prep concerns? Yes  Has the patient obtained the prep from the pharmacy ? Yes, pt have concerns about new prep instructions. Was given Miralax and Sutab  but there are no directions fro the Miralax on the prep instructions Do you have a care partner and transportation: Yes Any additional concerns? No

## 2024-04-02 NOTE — Telephone Encounter (Signed)
 Patient is giving Sophia a call back and would like to speak to her due to her having some confusion during her pre-visit appointment. Patient is requesting a call back. Please advise.

## 2024-04-03 ENCOUNTER — Encounter: Payer: Self-pay | Admitting: Gastroenterology

## 2024-04-03 ENCOUNTER — Encounter (INDEPENDENT_AMBULATORY_CARE_PROVIDER_SITE_OTHER): Payer: Self-pay

## 2024-04-03 ENCOUNTER — Other Ambulatory Visit: Payer: Self-pay | Admitting: Pharmacy Technician

## 2024-04-03 ENCOUNTER — Other Ambulatory Visit: Payer: Self-pay

## 2024-04-03 ENCOUNTER — Encounter (HOSPITAL_COMMUNITY): Payer: Self-pay | Admitting: Gastroenterology

## 2024-04-03 NOTE — Progress Notes (Signed)
 Specialty Pharmacy Refill Coordination Note  Lauren [name] is a 63 y.o. female contacted today regarding refills of specialty medication(s) Upadacitinib  (Rinvoq )   Patient requested (Patient-Rptd) Pickup at [facility] date: (Patient-Rptd) 04/07/24   Medication will be filled on 04/04/24.

## 2024-04-03 NOTE — Telephone Encounter (Signed)
 Review of instructions with pt.New set of instructions sent to pt. Pt had questions about insurance and RN instructed pt to call the  Customer Service # on back of insurance card to assist with any questions regarding insurance. Pt states she will.

## 2024-04-04 ENCOUNTER — Telehealth: Payer: Self-pay | Admitting: Genetic Counselor

## 2024-04-04 NOTE — Telephone Encounter (Signed)
 Spoke with Lauren [name] regarding the results of her recent genetic testing.   Lauren [name] was seen in the [facility] clinic on 03/19/2024 at 63 yo due to a personal history of meningiomas and angiomyolipoma.  After evaluation, genetic testing was ordered for Lauren [name] including gene panel testing.   The [facility] Multi-Cancer Panel (including TSC1/2) was negative/normal.  At this time, we have not identified a genetic cause for Lauren [name]'s symptoms.  No changes to medical management or testing of other family members are recommended based on these results.    The [facility] Hereditary Deafness Panel was negative/normal.  At this time, we have not identified a genetic cause for Lauren [name]'s symptoms.  No changes to medical management or testing of other family members are recommended based on these results.    We discussed that the laboratory will contact the clinic if there are ever any updates to Lauren [name]'s test results.  Lauren [name] expressed understanding of these results and was encouraged to reach out with any further questions.  The test report has been released to the family and is attached to the associated order.   Kimberly Molt, MS [facility] Certified Genetic Counselor

## 2024-04-07 ENCOUNTER — Other Ambulatory Visit (HOSPITAL_COMMUNITY): Payer: Self-pay

## 2024-04-10 ENCOUNTER — Encounter (HOSPITAL_COMMUNITY): Payer: Self-pay | Admitting: Gastroenterology

## 2024-04-10 ENCOUNTER — Ambulatory Visit (HOSPITAL_COMMUNITY)
Admission: RE | Admit: 2024-04-10 | Discharge: 2024-04-10 | Disposition: A | Discharge status: Home / Self Care | Attending: Gastroenterology | Admitting: Gastroenterology

## 2024-04-10 ENCOUNTER — Other Ambulatory Visit: Payer: Self-pay

## 2024-04-10 ENCOUNTER — Ambulatory Visit (HOSPITAL_COMMUNITY): Admitting: Anesthesiology

## 2024-04-10 ENCOUNTER — Encounter (HOSPITAL_COMMUNITY)
Admission: RE | Disposition: A | Discharge status: Home / Self Care | Payer: Self-pay | Source: Home / Self Care | Attending: Gastroenterology

## 2024-04-10 DIAGNOSIS — D122 Benign neoplasm of ascending colon: Secondary | ICD-10-CM | POA: Diagnosis not present

## 2024-04-10 DIAGNOSIS — D6800 Von Willebrand disease, unspecified: Secondary | ICD-10-CM | POA: Diagnosis not present

## 2024-04-10 DIAGNOSIS — K648 Other hemorrhoids: Secondary | ICD-10-CM | POA: Diagnosis not present

## 2024-04-10 DIAGNOSIS — Z1211 Encounter for screening for malignant neoplasm of colon: Secondary | ICD-10-CM | POA: Insufficient documentation

## 2024-04-10 DIAGNOSIS — N189 Chronic kidney disease, unspecified: Secondary | ICD-10-CM | POA: Diagnosis not present

## 2024-04-10 DIAGNOSIS — I129 Hypertensive chronic kidney disease with stage 1 through stage 4 chronic kidney disease, or unspecified chronic kidney disease: Secondary | ICD-10-CM | POA: Insufficient documentation

## 2024-04-10 DIAGNOSIS — K5100 Ulcerative (chronic) pancolitis without complications: Secondary | ICD-10-CM | POA: Insufficient documentation

## 2024-04-10 HISTORY — PX: COLONOSCOPY: SHX5424

## 2024-04-10 SURGERY — COLONOSCOPY
Anesthesia: Monitor Anesthesia Care

## 2024-04-10 MED ORDER — PROPOFOL 500 MG/50ML IV EMUL
INTRAVENOUS | Status: DC | PRN
  Administered 2024-04-10: 140 ug/kg/min via INTRAVENOUS

## 2024-04-10 MED ORDER — LACTATED RINGERS IV SOLN: INTRAVENOUS | Status: DC | PRN

## 2024-04-10 NOTE — Anesthesia Preprocedure Evaluation (Signed)
 Anesthesia Evaluation  Patient identified by MRN, date of birth, ID band Patient awake    Reviewed: Allergy & Precautions, NPO status , Patient's Chart, lab work & pertinent test results  Airway Mallampati: II  TM Distance: >3 FB Neck ROM: Full    Dental no notable dental hx.    Pulmonary neg pulmonary ROS   Pulmonary exam normal        Cardiovascular hypertension, Pt. on home beta blockers Normal cardiovascular exam     Neuro/Psych  PSYCHIATRIC DISORDERS Anxiety Depression    negative neurological ROS     GI/Hepatic Neg liver ROS, PUD,,,  Endo/Other  negative endocrine ROS    Renal/GU negative Renal ROS     Musculoskeletal negative musculoskeletal ROS (+)    Abdominal  (+) + obese  Peds  Hematology  (+) Blood dyscrasia   Anesthesia Other Findings Ulcerative pancolitis  High risk medication use Von Willebrands   Reproductive/Obstetrics                              Anesthesia Physical Anesthesia Plan  ASA: 2  Anesthesia Plan: MAC   Post-op Pain Management:    Induction:   PONV Risk Score and Plan: 2 and Propofol  infusion and Treatment may vary due to age or medical condition  Airway Management Planned: Simple Face Mask  Additional Equipment:   Intra-op Plan:   Post-operative Plan:   Informed Consent: I have reviewed the patients History and Physical, chart, labs and discussed the procedure including the risks, benefits and alternatives for the proposed anesthesia with the patient or authorized representative who has indicated his/her understanding and acceptance.     Dental advisory given  Plan Discussed with: CRNA  Anesthesia Plan Comments:          Anesthesia Quick Evaluation

## 2024-04-10 NOTE — Discharge Instructions (Signed)

## 2024-04-10 NOTE — Op Note (Signed)
 [facility] Patient Name: Lauren [name] Procedure Date: 04/10/2024 MRN: [id number] Attending MD: Elspeth SQUIBB. Leigh , MD, [id number] Date of Birth: 09/04/1960 CSN: [id number] Age: 63 Admit Type: Outpatient Procedure:                Colonoscopy Indications:              High risk colon cancer surveillance: Ulcerative                            pancolitis for years - has failed multiple regimens                            in the past, on Rinvoq since 2023 and clinically                            doing well. Surveillance exam. Of note, followed by                            Hematology for Von Willebrands / easy bleeding.                            They did not recommend DDAVP for this exam, only                            Tranexamic acid supplementation Providers:                Elspeth SQUIBB. Leigh, MD, Willy Hummer, RN, Felice Sar, Technician Referring MD:              Medicines:                Monitored Anesthesia Care Complications:            No immediate complications. Estimated blood loss:                            Minimal. Estimated Blood Loss:     Estimated blood loss was minimal. Procedure:                Pre-Anesthesia Assessment:                           - Prior to the procedure, a History and Physical                            was performed, and patient medications and                            allergies were reviewed. The patient's tolerance of                            previous anesthesia was also reviewed. The risks  and benefits of the procedure and the sedation                            options and risks were discussed with the patient.                            All questions were answered, and informed consent                            was obtained. Prior Anticoagulants: The patient has                            taken no anticoagulant or antiplatelet agents. ASA                             Grade Assessment: II - A patient with mild systemic                            disease. After reviewing the risks and benefits,                            the patient was deemed in satisfactory condition to                            undergo the procedure.                           After obtaining informed consent, the colonoscope                            was passed under direct vision. Throughout the                            procedure, the patient's blood pressure, pulse, and                            oxygen saturations were monitored continuously. The                            [id number]-[id number] ([id number]) colonoscope was introduced                            through the anus and advanced to the the terminal                            ileum, with identification of the appendiceal                            orifice and IC valve. The colonoscopy was performed                            without difficulty. The patient tolerated the  procedure well. The quality of the bowel                            preparation was good. The terminal ileum, ileocecal                            valve, appendiceal orifice, and rectum were                            photographed. Scope In: 9:13:17 AM Scope Out: 9:33:31 AM Scope Withdrawal Time: 0 hours 17 minutes 9 seconds  Total Procedure Duration: 0 hours 20 minutes 14 seconds  Findings:      The perianal and digital rectal examinations were normal.      The terminal ileum appeared normal.      A 3 mm polyp was found in the ascending colon. The polyp was flat. The       polyp was removed with a cold biopsy forceps. Resection and retrieval       were complete.      Internal hemorrhoids were found during retroflexion. The hemorrhoids       were small.      The exam was otherwise without abnormality. No active inflammation noted       anywhere.      Biopsies were taken with a cold forceps for histology / surveillance of       the  right, transverse, and left colon. Impression:               - The examined portion of the ileum was normal.                           - One 3 mm polyp in the ascending colon, removed                            with a cold biopsy forceps. Resected and retrieved.                           - Internal hemorrhoids.                           - The examination was otherwise normal.                           - Biopsies were taken with a cold forceps for                            histology.                           Overall, remains well controlled on Rinvoq , no                            appreciable active inflammation. Moderate Sedation:      No moderate sedation, case performed with MAC Recommendation:           - Patient has a contact number available for  emergencies. The signs and symptoms of potential                            delayed complications were discussed with the                            patient. Return to normal activities tomorrow.                            Written discharge instructions were provided to the                            patient.                           - Resume previous diet.                           - Continue present medications.                           - Take tranexamic acid  per hematology protocol post                            procedure / biopsies                           - Await pathology results. Procedure Code(s):        --- Professional ---                           [id number], Colonoscopy, flexible; with biopsy, single                            or multiple Diagnosis Code(s):        --- Professional ---                           K51.00, Ulcerative (chronic) pancolitis without                            complications                           K64.8, Other hemorrhoids                           D12.2, Benign neoplasm of ascending colon CPT copyright 2022 American Medical Association. All rights reserved. The codes  documented in this report are preliminary and upon coder review may  be revised to meet current compliance requirements. Elspeth P. Jenney Brester, MD 04/10/2024 9:45:34 AM This report has been signed electronically. Number of Addenda: 0

## 2024-04-10 NOTE — H&P (Signed)
  Gastroenterology History and Physical   Primary Care Physician:  Juliane Che, PA   Reason for Procedure:   Ulcerative colitis  Plan:    colonoscopy     HPI: Lauren [name] is a 63 y.o. female  here for colonoscopy surveillance - longstanding left sided UC then transitioned to pancolitis, has failed multiple regimens. Now on Rinvoq , had been doing well on Rinvoq . Here for surveillance colonoscopy. She has been seen by hematology, they did not recommend DDAVP  infusions which she has had in the past. Has been on Transexamic acid to reduce bleeding risk due to Von Willebrand / clotting disorder.    . Patient denies any bowel symptoms at this time. Otherwise feels well without any cardiopulmonary symptoms. Case done at the hospital due to bleeding disorder, risk for bleeding.   I have discussed risks / benefits of anesthesia and endoscopic procedure with Rock VEAR Moose and they wish to proceed with the exams as outlined today.    Past Medical History:  Diagnosis Date   Allergy    Anemia    Anxiety    Bradycardia    Cataract    Cellulitis of left breast 09/17/2015   diagnosed at Urgent Care- on ABX   Chronic kidney disease    tumors of L kidney   Clostridium difficile diarrhea    Clotting disorder (HCC)    Depression    takes Prozac  daily   Hearing loss    right ear   History of benign brain tumor    meninoma   History of colon polyps    benign   Hyperlipidemia    Hypertension    takes Metoprolol  daily   Hypokalemia    takes Potassium every other day   Inflammatory polyps of colon (HCC)    Internal hemorrhoids    Mucosal abnormality of stomach    Osteopenia    Osteoporosis    Personal history of meningioma of the brain    Platelet disorder (HCC)    Pneumonia    hx of-about 4+ yrs ago   Seasonal allergies    takes Allegra as needed and Flonase daily   SVT (supraventricular tachycardia) (HCC)    Tennis elbow    Ulcerative colitis    On Entyvio  q 4 week  dosing   Urinary urgency    Vitamin B12 deficiency    Von Willebrands disease (HCC)     Past Surgical History:  Procedure Laterality Date   BAHA REVISION Right 03/15/2016   Procedure: REVISION RIGHT BAHA HEARING IMPLANT ;  Surgeon: Camellia Milliner, MD;  Location: [facility] OR;  Service: ENT;  Laterality: Right;   BIOPSY  01/24/2019   Procedure: BIOPSY;  Surgeon: Leigh Elspeth SQUIBB, MD;  Location: [facility];  Service: Gastroenterology;;   BIOPSY  01/15/2020   Procedure: BIOPSY;  Surgeon: Leigh Elspeth SQUIBB, MD;  Location: [facility];  Service: Gastroenterology;;   BIOPSY  03/22/2021   Procedure: BIOPSY;  Surgeon: Leigh Elspeth SQUIBB, MD;  Location: [facility];  Service: Gastroenterology;;   BIOPSY  05/04/2022   Procedure: BIOPSY;  Surgeon: Leigh Elspeth SQUIBB, MD;  Location: [facility];  Service: Gastroenterology;;   BRAIN MENINGIOMA EXCISION  05/2007   CARDIAC ELECTROPHYSIOLOGY STUDY AND ABLATION  10/22/09   COLONOSCOPY N/A 07/29/2013   Procedure: COLONOSCOPY;  Surgeon: Princella CHRISTELLA Nida, MD;  Location: [facility];  Service: Endoscopy;  Laterality: N/A;   COLONOSCOPY WITH PROPOFOL  N/A 09/21/2015   Procedure: COLONOSCOPY WITH PROPOFOL ;  Surgeon: Elspeth Deward Leigh, MD;  Location: [facility];  Service: Gastroenterology;  Laterality: N/A;   COLONOSCOPY WITH PROPOFOL  N/A 10/30/2017   Procedure: COLONOSCOPY WITH PROPOFOL ;  Surgeon: Leigh Elspeth SQUIBB, MD;  Location: [facility];  Service: Gastroenterology;  Laterality: N/A;   COLONOSCOPY WITH PROPOFOL  N/A 01/24/2019   Procedure: COLONOSCOPY WITH PROPOFOL ;  Surgeon: Leigh Elspeth SQUIBB, MD;  Location: [facility];  Service: Gastroenterology;  Laterality: N/A;   COLONOSCOPY WITH PROPOFOL  N/A 01/15/2020   Procedure: COLONOSCOPY WITH PROPOFOL ;  Surgeon: Leigh Elspeth SQUIBB, MD;  Location: [facility];  Service: Gastroenterology;  Laterality: N/A;  needs DDAVP  30-60 minutes prior to procedure.   COLONOSCOPY WITH PROPOFOL  N/A 03/22/2021    Procedure: COLONOSCOPY WITH PROPOFOL ;  Surgeon: Leigh Elspeth SQUIBB, MD;  Location: [facility];  Service: Gastroenterology;  Laterality: N/A;   COLONOSCOPY WITH PROPOFOL  N/A 05/04/2022   Procedure: COLONOSCOPY WITH PROPOFOL ;  Surgeon: Leigh Elspeth SQUIBB, MD;  Location: [facility];  Service: Gastroenterology;  Laterality: N/A;   ESOPHAGOGASTRODUODENOSCOPY     HEMOSTASIS CLIP PLACEMENT  03/22/2021   Procedure: HEMOSTASIS CLIP PLACEMENT;  Surgeon: Leigh Elspeth SQUIBB, MD;  Location: [facility];  Service: Gastroenterology;;   IMPLANTATION BONE ANCHORED HEARING AID Right    POLYPECTOMY  01/24/2019   Procedure: POLYPECTOMY;  Surgeon: Leigh Elspeth SQUIBB, MD;  Location: [facility];  Service: Gastroenterology;;   POLYPECTOMY  01/15/2020   Procedure: POLYPECTOMY;  Surgeon: Leigh Elspeth SQUIBB, MD;  Location: [facility];  Service: Gastroenterology;;   POLYPECTOMY  03/22/2021   Procedure: POLYPECTOMY;  Surgeon: Leigh Elspeth SQUIBB, MD;  Location: [facility];  Service: Gastroenterology;;   WISDOM TOOTH EXTRACTION      Prior to Admission medications   Medication Sig Start Date End Date Taking? Authorizing Provider  CALCIUM  PO Take 600 mg by mouth. 12/11/23  Yes [provider]  Cholecalciferol (VITAMIN D3) 125 MCG (5000 UT) CAPS Take 5,000 Units by mouth daily.   Yes [provider]  clotrimazole-betamethasone (LOTRISONE) cream Apply 1 application topically daily as needed (Eczema or inflamed in skull). 04/15/20  Yes [provider]  FLUoxetine  (PROZAC ) 20 MG capsule Take 1 capsule (20 mg total) by mouth daily. 03/05/24  Yes   hydrocortisone  2.5 % ointment Apply 1 application topically 2 (two) times daily as needed (for eczema).   Yes [provider]  metoprolol  succinate (TOPROL  XL) 25 MG 24 hr tablet Take 1 tablet (25 mg total) by mouth daily. 01/02/24  Yes Cindie Ole DASEN, MD  Sodium Sulfate-Mag Sulfate-KCl (SUTAB ) 1479-225-188 MG TABS Take by mouth as  directed for colonoscopy. 03/24/24  Yes Gisela Lea, Elspeth SQUIBB, MD  tranexamic acid  (LYSTEDA ) 650 MG TABS tablet Take 1,300 mg by mouth 3 (three) times daily as needed (FOR INTERNAL PROCEDURE TO STOP BLEEDING). For internal procedure to stop bleeding 04/24/22  Yes [provider]  Upadacitinib  ER (RINVOQ ) 30 MG TB24 Take 1 tablet (30 mg total) by mouth daily. 07/31/23  Yes Alic Hilburn, Elspeth SQUIBB, MD  mometasone  (ELOCON ) 0.1 % cream Apply a small amount to affected area twice a day Patient taking differently: Apply topically as needed. 08/01/23     NON FORMULARY Take 1 tablet by mouth daily. Terra[employer] Teacher, English as a foreign language) Digestive enzyme    [provider]  OVER THE COUNTER MEDICATION Take 1 capsule by mouth daily. Doterra  Micro plex Vmz    [provider]  OVER THE COUNTER MEDICATION Take 1 tablet by mouth daily. Doterra Dr-prime culture complex    [provider]    No current facility-administered medications for  this encounter.    Allergies as of 02/08/2024 - Review Complete 02/08/2024  Allergen Reaction Noted   Atenolol Swelling    Nsaids Other (See Comments) 12/19/2017   Sulfamethoxazole  Other (See Comments) and Rash 02/27/2019   Asa [aspirin] Other (See Comments) 05/21/2018   Cephalexin Other (See Comments) 06/24/2008   Corn starch Other (See Comments) 03/17/2021   Ibuprofen Other (See Comments) 05/21/2018   Mercaptopurine  Hives and Itching 01/14/2020   Soy allergy (obsolete) Other (See Comments) 03/14/2021    Family History  Problem Relation Age of Onset   Heart disease Mother    Stroke Mother    Diabetes Maternal Uncle    Heart disease Maternal Grandfather    Breast cancer Paternal Grandmother    Colon cancer Neg Hx    Esophageal cancer Neg Hx    Rectal cancer Neg Hx    Stomach cancer Neg Hx    Colon polyps Neg Hx     Social History   Socioeconomic History   Marital status: Married    Spouse name: Garrel   Number of children: 1   Years  of education: [employer] Diploma   Highest education level: Not on file  Occupational History   Occupation: Dispensing optician: [employer]  Tobacco Use   Smoking status: Never    Passive exposure: Never   Smokeless tobacco: Never  Vaping Use   Vaping status: Never Used  Substance and Sexual Activity   Alcohol use: No    Alcohol/week: 0.0 standard drinks of alcohol   Drug use: No   Sexual activity: Yes    Partners: Male    Birth control/protection: Post-menopausal  Other Topics Concern   Not on file  Social History Narrative   Lives with her husband.   Their daughter lives nearby with her own family.   Social Drivers of Corporate investment banker Strain: Low Risk  (12/11/2023)   Received from [facility]   Overall Financial Resource Strain (CARDIA)    Difficulty of Paying Living Expenses: Not hard at all  Food Insecurity: No Food Insecurity (12/11/2023)   [facility] from [facility]   Hunger Vital Sign    Within the past 12 months, you worried that your food would run out before you got the money to buy more.: Never true    Within the past 12 months, the food you bought just didn't last and you didn't have money to get more.: Never true  Transportation Needs: No Transportation Needs (12/11/2023)   Received from [facility] - Transportation    Lack of Transportation (Medical): No    Lack of Transportation (Non-Medical): No  Physical Activity: Insufficiently Active (12/11/2023)   [facility] from [facility]   Exercise Vital Sign    On average, how many days per week do you engage in moderate to strenuous exercise (like a brisk walk)?: 2 days    On average, how many minutes do you engage in exercise at this level?: 10 min  Stress: No Stress Concern Present (12/11/2023)   Received from [facility] - Occupational Stress Questionnaire    Feeling of Stress : Only a little  Social  Connections: Socially Integrated (12/11/2023)   Received from [facility]   Social Network    How would you rate your social network (family, work, friends)?: Good participation with social networks  Intimate Partner Violence: At Risk (12/11/2023)   Received from [facility]   HITS    Over the last 12 months how often did your partner physically hurt you?: Never    Over the last 12 months how often did your partner insult you or talk down to you?: Frequently    Over the last 12 months how often did your partner threaten you with physical harm?: Sometimes    Over the last 12 months how often did your partner scream or curse at you?: Rarely    Review of Systems: All other review of systems negative except as mentioned in the HPI.  Physical Exam: Vital signs BP 120/62   Pulse (!) 58   Temp 97.9 F (36.6 C) (Temporal)   Resp 19   Ht 4' 11 (1.499 m)   Wt 75 kg   LMP 11/26/2008   SpO2 97%   BMI 33.40 kg/m   General:   Alert,  Well-developed, pleasant and cooperative in NAD Lungs:  Clear throughout to auscultation.   Heart:  Regular rate and rhythm Abdomen:  Soft, nontender and nondistended.   Neuro/Psych:  Alert and cooperative. Normal mood and affect. A and O x 3  Marcey Naval, MD [facility] Gastroenterology

## 2024-04-10 NOTE — Transfer of Care (Signed)
 Immediate Anesthesia Transfer of Care Note  Patient: Lauren [name]  Procedure(s) Performed: COLONOSCOPY  Patient Location: PACU  Anesthesia Type:MAC  Level of Consciousness: awake and alert   Airway & Oxygen Therapy: Patient Spontanous Breathing  Post-op Assessment: Report given to RN  Post vital signs: Reviewed and stable  Last Vitals:  Vitals Value Taken Time  BP 123/69 04/10/24 09:40  Temp    Pulse 52 04/10/24 09:41  Resp 15 04/10/24 09:41  SpO2 100 % 04/10/24 09:41  Vitals shown include unfiled device data.  Last Pain:  Vitals:   04/10/24 0940  TempSrc:   PainSc: 0-No pain         Complications: No notable events documented.

## 2024-04-10 NOTE — Anesthesia Postprocedure Evaluation (Signed)
 Anesthesia Post Note  Patient: Lauren [name]  Procedure(s) Performed: COLONOSCOPY     Patient location during evaluation: Endoscopy Anesthesia Type: MAC Level of consciousness: awake Pain management: pain level controlled Vital Signs Assessment: post-procedure vital signs reviewed and stable Respiratory status: spontaneous breathing, nonlabored ventilation and respiratory function stable Cardiovascular status: blood pressure returned to baseline and stable Postop Assessment: no apparent nausea or vomiting Anesthetic complications: no   No notable events documented.  Last Vitals:  Vitals:   04/10/24 0950 04/10/24 0955  BP: 123/72   Pulse: (!) 52 (!) 49  Resp: 15 13  Temp:    SpO2: 100% 100%    Last Pain:  Vitals:   04/10/24 0955  TempSrc:   PainSc: 0-No pain                 Laurencia Roma P Vergene Marland

## 2024-04-11 ENCOUNTER — Ambulatory Visit: Payer: Self-pay | Admitting: Gastroenterology

## 2024-04-11 LAB — SURGICAL PATHOLOGY

## 2024-04-14 ENCOUNTER — Encounter (HOSPITAL_COMMUNITY): Payer: Self-pay | Admitting: Gastroenterology

## 2024-04-30 ENCOUNTER — Other Ambulatory Visit (HOSPITAL_COMMUNITY): Payer: Self-pay

## 2024-05-07 ENCOUNTER — Ambulatory Visit (HOSPITAL_COMMUNITY)
Admission: RE | Admit: 2024-05-07 | Discharge: 2024-05-07 | Disposition: A | Discharge status: Home / Self Care | Source: Ambulatory Visit | Attending: Urology | Admitting: Urology

## 2024-05-07 DIAGNOSIS — D179 Benign lipomatous neoplasm, unspecified: Secondary | ICD-10-CM | POA: Insufficient documentation

## 2024-05-09 ENCOUNTER — Other Ambulatory Visit: Payer: Self-pay

## 2024-05-09 NOTE — Progress Notes (Signed)
 Specialty Pharmacy Refill Coordination Note  Lauren [name] is a 63 y.o. female contacted today regarding refills of specialty medication(s) Upadacitinib  (Rinvoq )   Patient requested [name] at [facility] at [place] date: 05/12/24   Medication will be filled on 05/09/24.

## 2024-05-14 ENCOUNTER — Ambulatory Visit (INDEPENDENT_AMBULATORY_CARE_PROVIDER_SITE_OTHER): Admitting: Urology

## 2024-05-14 VITALS — BP 136/84 | HR 54

## 2024-05-14 DIAGNOSIS — N3281 Overactive bladder: Secondary | ICD-10-CM | POA: Diagnosis not present

## 2024-05-14 DIAGNOSIS — D179 Benign lipomatous neoplasm, unspecified: Secondary | ICD-10-CM | POA: Diagnosis not present

## 2024-05-14 LAB — MICROSCOPIC EXAMINATION: Bacteria, UA: NONE SEEN

## 2024-05-14 LAB — URINALYSIS, ROUTINE W REFLEX MICROSCOPIC
Bilirubin, UA: NEGATIVE
Glucose, UA: NEGATIVE
Ketones, UA: NEGATIVE
Nitrite, UA: NEGATIVE
Protein,UA: NEGATIVE
Specific Gravity, UA: <1.005 — ABNORMAL LOW (ref 1.005–1.030)
Urobilinogen, Ur: 0.2 mg/dL (ref 0.2–1.0)
pH, UA: 7 (ref 5.0–7.5)

## 2024-05-14 NOTE — Patient Instructions (Signed)
 A Growth in the Kidney (Renal Mass): What to Know  A renal mass is an abnormal growth in the kidney. It may be found during an MRI, CT scan, or ultrasound that's done to check for other problems in the belly. Some renal masses are cancerous, or malignant, and can grow or spread quickly. Others are benign, which means they're not cancer. Renal masses include: Tumors. These may be either cancerous or benign. The most common cancerous tumor in adults is called renal cell carcinoma. In children, the most common type is Wilms tumor. The most common kidney tumors that aren't cancer include renal adenomas, oncocytomas, and angiomyolipomas (AML). Cysts. These are pockets of fluid that form on or in the kidney. What are the causes? Certain types of cancers, infections, or injuries can cause a renal mass. It's not always known what causes a cyst to form in or on the kidney. What are the signs or symptoms? Often, a renal mass or kidney cyst doesn't cause any signs or symptoms. How is this diagnosed? Your health care provider may suggest tests to diagnose the cause of your renal mass. These tests may include: A physical exam. Blood tests. Pee (urine) tests. Imaging tests, such as: CT scan. MRI. Ultrasound. Chest X-ray or bone scan. These may be done if a tumor is cancerous to see if the cancer has spread outside the kidney. Biopsy. This is when a small piece of tissue is removed from the renal mass for testing. How is this treated? Treatment is not always needed for a renal mass. Treatment will depend on the cause of the mass and if it's causing any problems or symptoms. For a cancerous renal mass, treatment options may include: Surgery. This is done to remove the tumor and any affected tissue. Chemotherapy. This uses medicines to kill cancer cells. Radiation. High-energy X-rays or gamma rays are used to kill cancer cells. Ablation. This uses extreme hot or cold temperature to kill the cancer  cells. Immunotherapy. Medicines are used to help the body's defense system (immune system) fight the cancer cells. Taking part in clinical trials. This involves trying new or experimental treatments to see if they're effective. Most kidney cysts don't need to be treated. Follow these instructions at home: What you need to do at home will depend on the cause of the mass. Follow the instructions that your provider gives you. In general: Take medicines only as told. If you were given antibiotics, take them as told. Do not stop taking them even if you start to feel better. Follow any instructions from your provider about what you can and can't do. Do not smoke, vape, or use nicotine or tobacco. Keep all follow-up visits. Your provider will need to check if your renal mass has changed or grown. Contact a health care provider if: You have flank pain, which is pain in your side or back. You have a fever. You have a loss of appetite. You have pain or swelling in your belly. You lose weight. Get help right away if: Your pain gets worse. There's blood in your pee. You can't pee. This information is not intended to replace advice given to you by your health care provider. Make sure you discuss any questions you have with your health care provider. Document Revised: 02/09/2023 Document Reviewed: 02/09/2023 Elsevier Patient Education  2025 [employer].

## 2024-05-14 NOTE — Progress Notes (Signed)
 05/14/2024 2:55 PM   Rock VEAR Moose 19-Jun-1961 [id number]  Referring provider: Pura Lenis, MD [address] [place],  [place] [address]  Followup renal AMLs   HPI: Ms Wingard is a 63yo here for followup for renal AMLs. Renal US  shows three stable AMLs. She is having new urinary urgency when she stands up from a sitting position. This has occurred 3-4 times in 6 months. No urge incontinence and no incontinence pad usage. Urine stream is strong. No straining to urinate.     PMH: Past Medical History:  Diagnosis Date   Allergy    Anemia    Anxiety    Bradycardia    Cataract    Cellulitis of left breast 09/17/2015   diagnosed at Urgent Care- on ABX   Chronic kidney disease    tumors of L kidney   Clostridium difficile diarrhea    Clotting disorder (HCC)    Depression    takes Prozac  daily   Hearing loss    right ear   History of benign brain tumor    meninoma   History of colon polyps    benign   Hyperlipidemia    Hypertension    takes Metoprolol  daily   Hypokalemia    takes Potassium every other day   Inflammatory polyps of colon (HCC)    Internal hemorrhoids    Mucosal abnormality of stomach    Osteopenia    Osteoporosis    Personal history of meningioma of the brain    Platelet disorder (HCC)    Pneumonia    hx of-about 4+ yrs ago   Seasonal allergies    takes Allegra as needed and Flonase daily   SVT (supraventricular tachycardia) (HCC)    Tennis elbow    Ulcerative colitis    On Entyvio  q 4 week dosing   Urinary urgency    Vitamin B12 deficiency    V[place] disease (HCC)     Surgical History: Past Surgical History:  Procedure Laterality Date   BAHA REVISION Right 03/15/2016   Procedure: REVISION RIGHT BAHA HEARING IMPLANT ;  Surgeon: Camellia Milliner, MD;  Location: [facility] OR;  Service: ENT;  Laterality: Right;   BIOPSY  01/24/2019   Procedure: BIOPSY;  Surgeon: Leigh Elspeth SQUIBB, MD;  Location: [facility];  Service:  Gastroenterology;;   BIOPSY  01/15/2020   Procedure: BIOPSY;  Surgeon: Leigh Elspeth SQUIBB, MD;  Location: [facility];  Service: Gastroenterology;;   BIOPSY  03/22/2021   Procedure: BIOPSY;  Surgeon: Leigh Elspeth SQUIBB, MD;  Location: [facility];  Service: Gastroenterology;;   BIOPSY  05/04/2022   Procedure: BIOPSY;  Surgeon: Leigh Elspeth SQUIBB, MD;  Location: [facility];  Service: Gastroenterology;;   BRAIN MENINGIOMA EXCISION  05/2007   CARDIAC ELECTROPHYSIOLOGY STUDY AND ABLATION  10/22/09   COLONOSCOPY N/A 07/29/2013   Procedure: COLONOSCOPY;  Surgeon: Princella CHRISTELLA Nida, MD;  Location: [facility];  Service: Endoscopy;  Laterality: N/A;   COLONOSCOPY N/A 04/10/2024   Procedure: COLONOSCOPY;  Surgeon: Leigh Elspeth SQUIBB, MD;  Location: [facility];  Service: Gastroenterology;  Laterality: N/A;   COLONOSCOPY WITH PROPOFOL  N/A 09/21/2015   Procedure: COLONOSCOPY WITH PROPOFOL ;  Surgeon: Elspeth Deward Leigh, MD;  Location: [facility];  Service: Gastroenterology;  Laterality: N/A;   COLONOSCOPY WITH PROPOFOL  N/A 10/30/2017   Procedure: COLONOSCOPY WITH PROPOFOL ;  Surgeon: Leigh Elspeth SQUIBB, MD;  Location: [facility];  Service: Gastroenterology;  Laterality: N/A;   COLONOSCOPY WITH PROPOFOL  N/A 01/24/2019   Procedure: COLONOSCOPY  WITH PROPOFOL ;  Surgeon: Leigh Elspeth SQUIBB, MD;  Location: [facility];  Service: Gastroenterology;  Laterality: N/A;   COLONOSCOPY WITH PROPOFOL  N/A 01/15/2020   Procedure: COLONOSCOPY WITH PROPOFOL ;  Surgeon: Leigh Elspeth SQUIBB, MD;  Location: [facility];  Service: Gastroenterology;  Laterality: N/A;  needs DDAVP  30-60 minutes prior to procedure.   COLONOSCOPY WITH PROPOFOL  N/A 03/22/2021   Procedure: COLONOSCOPY WITH PROPOFOL ;  Surgeon: Leigh Elspeth SQUIBB, MD;  Location: [facility];  Service: Gastroenterology;  Laterality: N/A;   COLONOSCOPY WITH PROPOFOL  N/A 05/04/2022   Procedure: COLONOSCOPY WITH PROPOFOL ;  Surgeon: Leigh Elspeth SQUIBB, MD;   Location: [facility];  Service: Gastroenterology;  Laterality: N/A;   ESOPHAGOGASTRODUODENOSCOPY     HEMOSTASIS CLIP PLACEMENT  03/22/2021   Procedure: HEMOSTASIS CLIP PLACEMENT;  Surgeon: Leigh Elspeth SQUIBB, MD;  Location: [facility];  Service: Gastroenterology;;   IMPLANTATION BONE ANCHORED HEARING AID Right    POLYPECTOMY  01/24/2019   Procedure: POLYPECTOMY;  Surgeon: Leigh Elspeth SQUIBB, MD;  Location: [facility];  Service: Gastroenterology;;   POLYPECTOMY  01/15/2020   Procedure: POLYPECTOMY;  Surgeon: Leigh Elspeth SQUIBB, MD;  Location: [facility];  Service: Gastroenterology;;   POLYPECTOMY  03/22/2021   Procedure: POLYPECTOMY;  Surgeon: Leigh Elspeth SQUIBB, MD;  Location: [facility];  Service: Gastroenterology;;   WISDOM TOOTH EXTRACTION      Home Medications:  Allergies as of 05/14/2024       Reactions   Atenolol Swelling   Nsaids Other (See Comments)   PLATELET DISORDER  NSAIDS contraindicated with bleeding disorder. NSAIDS contraindicated with bleeding disorder.   Sulfamethoxazole  Other (See Comments), Rash   redness   Asa [aspirin] Other (See Comments)   Platelet disorder   Cephalexin Other (See Comments)   Upset her colitis   Corn Starch Other (See Comments)   Gi-upset Allergy test   Ibuprofen Other (See Comments)   Platelet disrder   Mercaptopurine  Hives, Itching   Elevated liver levels   Soy Allergy (obsolete) Other (See Comments)   GI symptoms         Medication List        Accurate as of May 14, 2024  2:55 PM. If you have any questions, ask your nurse or doctor.          CALCIUM  PO Take 600 mg by mouth.   clotrimazole-betamethasone cream Commonly known as: LOTRISONE Apply 1 application topically daily as needed (Eczema or inflamed in skull).   FLUoxetine  20 MG capsule Commonly known as: PROZAC  Take 1 capsule (20 mg total) by mouth daily.   hydrocortisone  2.5 % ointment Apply 1 application topically 2 (two) times daily  as needed (for eczema).   metoprolol  succinate 25 MG 24 hr tablet Commonly known as: Toprol  XL Take 1 tablet (25 mg total) by mouth daily.   mometasone  0.1 % cream Commonly known as: ELOCON  Apply a small amount to affected area twice a day What changed:  when to take this reasons to take this   NON FORMULARY Take 1 tablet by mouth daily. Terrazyme (Essential Oil) Digestive enzyme   OVER THE COUNTER MEDICATION Take 1 capsule by mouth daily. Doterra  Micro plex Vmz   OVER THE COUNTER MEDICATION Take 1 tablet by mouth daily. Doterra Dr-prime culture complex   Rinvoq  30 MG Tb24 Generic drug: Upadacitinib  ER Take 1 tablet (30 mg total) by mouth daily.   Sutab  [phone] MG Tabs Generic drug: Sodium Sulfate-Mag Sulfate-KCl Take by mouth as directed for colonoscopy.   tranexamic acid  650 MG  Tabs tablet Commonly known as: LYSTEDA  Take 1,300 mg by mouth 3 (three) times daily as needed (FOR INTERNAL PROCEDURE TO STOP BLEEDING). For internal procedure to stop bleeding   Vitamin D3 125 MCG (5000 UT) Caps Take 5,000 Units by mouth daily.        Allergies:  Allergies  Allergen Reactions   Atenolol Swelling   Nsaids Other (See Comments)    PLATELET DISORDER  NSAIDS contraindicated with bleeding disorder.  NSAIDS contraindicated with bleeding disorder.   Sulfamethoxazole  Other (See Comments) and Rash    redness   Asa [Aspirin] Other (See Comments)    Platelet disorder    Cephalexin Other (See Comments)    Upset her colitis    Corn Starch Other (See Comments)    Gi-upset  Allergy test   Ibuprofen Other (See Comments)    Platelet disrder   Mercaptopurine  Hives and Itching    Elevated liver levels   Soy Allergy (Obsolete) Other (See Comments)    GI symptoms     Family History: Family History  Problem Relation Age of Onset   Heart disease Mother    Stroke Mother    Diabetes Maternal Uncle    Heart disease Maternal Grandfather    Breast cancer Paternal  Grandmother    Colon cancer Neg Hx    Esophageal cancer Neg Hx    Rectal cancer Neg Hx    Stomach cancer Neg Hx    Colon polyps Neg Hx     Social History:  reports that she has never smoked. She has never been exposed to tobacco smoke. She has never used smokeless tobacco. She reports that she does not drink alcohol and does not use drugs.  ROS: All other review of systems were reviewed and are negative except what is noted above in HPI  Physical Exam: BP 136/84   Pulse (!) 54   LMP 11/26/2008   Constitutional:  Alert and oriented, No acute distress. HEENT: [place] AT, moist mucus membranes.  Trachea midline, no masses. Cardiovascular: No clubbing, cyanosis, or edema. Respiratory: Normal respiratory effort, no increased work of breathing. GI: Abdomen is soft, nontender, nondistended, no abdominal masses GU: No CVA tenderness.  Lymph: No cervical or inguinal lymphadenopathy. Skin: No rashes, bruises or suspicious lesions. Neurologic: Grossly intact, no focal deficits, moving all 4 extremities. Psychiatric: Normal mood and affect.  Laboratory Data: Lab Results  Component Value Date   WBC 4.0 08/17/2023   HGB 13.1 08/17/2023   HCT 39.0 08/17/2023   MCV 92.9 08/17/2023   PLT 237.0 08/17/2023    Lab Results  Component Value Date   CREATININE 1.02 08/17/2023    No results found for: PSA  No results found for: TESTOSTERONE  Lab Results  Component Value Date   HGBA1C 5.7 09/03/2009    Urinalysis    Component Value Date/Time   COLORURINE LT. YELLOW 07/31/2013 0811   APPEARANCEUR Clear 11/05/2023 1515   LABSPEC 1.015 07/31/2013 0811   PHURINE 6.0 07/31/2013 0811   GLUCOSEU Negative 11/05/2023 1515   GLUCOSEU NEGATIVE 07/31/2013 0811   HGBUR Moderate 07/31/2013 0811   BILIRUBINUR Negative 11/05/2023 1515   KETONESUR negative 09/26/2016 1814   KETONESUR NEGATIVE 07/31/2013 0811   PROTEINUR Negative 11/05/2023 1515   PROTEINUR NEGATIVE 06/24/2007 1621    UROBILINOGEN 0.2 09/26/2016 1814   UROBILINOGEN 0.2 07/31/2013 0811   NITRITE Negative 11/05/2023 1515   NITRITE NEGATIVE 07/31/2013 0811   LEUKOCYTESUR 2+ (A) 11/05/2023 1515    Lab Results  Component Value  Date   LABMICR See below: 11/05/2023   WBCUA 11-30 (A) 11/05/2023   LABEPIT 0-10 11/05/2023   MUCUS neg 12/31/2014   BACTERIA Few 11/05/2023    Pertinent Imaging: Renal US  05/07/24: Images reviewed and discussed with the patient  No results found for this or any previous visit.  No results found for this or any previous visit.  No results found for this or any previous visit.  No results found for this or any previous visit.  Results for orders placed during the hospital encounter of 05/07/24  Ultrasound renal complete  Narrative CLINICAL DATA:  Follow-up examination for renal masses  EXAM: RENAL / URINARY TRACT ULTRASOUND COMPLETE  COMPARISON:  Prior ultrasound from 11/25/2023  FINDINGS: Right Kidney:  Renal measurements: 10.4 x 4.4 x 5.3 cm = volume: 126.1 mL. Renal echogenicity within normal limits. No nephrolithiasis or hydronephrosis. No focal renal mass.  Left Kidney:  Renal measurements: 10.0 x 4.8 x 4.6 cm = volume: 115.1 mL. Renal echogenicity within normal limits. No nephrolithiasis or hydronephrosis.  Rounded echogenic solid mass at the interpolar region measures 7 x 6 x 6 mm, consistent with an AML, stable from prior (previously measured 7 x 8 x 7 mm).  A second echogenic lesion at the lower pole measures 6 x 6 x 6 mm, also consistent with an AML, and unchanged (previously measured 6 x 5 x 7 mm).  A third AML at the upper pole measures 7 x 4 x 5 mm, also relatively stable (previously measured 5 mm).  Bladder:  Appears normal for degree of bladder distention. Both jets are seen.  Other:  None.  IMPRESSION: 1. Three distinct echogenic lesions within the left kidney, consistent with benign angiomyolipomas, not significantly  changed from prior ultrasound from 10/29/2023. Largest of these measures up to 8 mm. 2. Otherwise unremarkable and normal renal ultrasound.   Electronically Signed By: Morene Hoard M.D. On: 05/08/2024 22:19  No results found for this or any previous visit.  No results found for this or any previous visit.  No results found for this or any previous visit.   Assessment & Plan:    1. Angiomyolipoma (Primary) Followup 1 year with a renal US  - Urinalysis, Routine w reflex microscopic  2. OAB -patient defers therapy at this time   No follow-ups on file.  Belvie Clara, MD  [facility] [place]

## 2024-05-20 ENCOUNTER — Encounter: Payer: Self-pay | Admitting: Urology

## 2024-06-06 ENCOUNTER — Other Ambulatory Visit: Payer: Self-pay

## 2024-06-06 NOTE — Progress Notes (Signed)
 Specialty Pharmacy Refill Coordination Note  Lauren [name] is a 63 y.o. female contacted today regarding refills of specialty medication(s) Upadacitinib  (Rinvoq )   Patient requested [name] at [facility] at [place] date: 06/09/24   Medication will be filled on 06/06/24.

## 2024-06-25 ENCOUNTER — Other Ambulatory Visit: Payer: Self-pay | Admitting: Physician Assistant

## 2024-06-25 DIAGNOSIS — Z1231 Encounter for screening mammogram for malignant neoplasm of breast: Secondary | ICD-10-CM

## 2024-07-01 ENCOUNTER — Other Ambulatory Visit (HOSPITAL_COMMUNITY): Payer: Self-pay

## 2024-07-03 ENCOUNTER — Other Ambulatory Visit: Payer: Self-pay

## 2024-07-07 ENCOUNTER — Other Ambulatory Visit: Payer: Self-pay

## 2024-07-07 NOTE — Progress Notes (Signed)
 Specialty Pharmacy Refill Coordination Note  Lauren [name] is a 63 y.o. female contacted today regarding refills of specialty medication(s) Upadacitinib  (Rinvoq )   Patient requested [name] at [facility] at [place] date: 07/11/24   Medication will be filled on: 07/10/24

## 2024-07-09 ENCOUNTER — Other Ambulatory Visit: Payer: Self-pay

## 2024-07-14 ENCOUNTER — Other Ambulatory Visit (HOSPITAL_COMMUNITY): Payer: Self-pay

## 2024-07-23 ENCOUNTER — Ambulatory Visit
Admission: RE | Admit: 2024-07-23 | Discharge: 2024-07-23 | Disposition: A | Source: Ambulatory Visit | Attending: Physician Assistant | Admitting: Physician Assistant

## 2024-07-23 DIAGNOSIS — Z1231 Encounter for screening mammogram for malignant neoplasm of breast: Secondary | ICD-10-CM

## 2024-07-31 ENCOUNTER — Other Ambulatory Visit: Payer: Self-pay | Admitting: Physician Assistant

## 2024-07-31 DIAGNOSIS — R928 Other abnormal and inconclusive findings on diagnostic imaging of breast: Secondary | ICD-10-CM

## 2024-08-06 ENCOUNTER — Other Ambulatory Visit: Payer: Self-pay | Admitting: Gastroenterology

## 2024-08-06 ENCOUNTER — Other Ambulatory Visit: Payer: Self-pay

## 2024-08-06 ENCOUNTER — Telehealth: Payer: Self-pay

## 2024-08-06 ENCOUNTER — Other Ambulatory Visit (HOSPITAL_COMMUNITY): Payer: Self-pay

## 2024-08-06 MED ORDER — RINVOQ 30 MG PO TB24
30.0000 mg | ORAL_TABLET | Freq: Every day | ORAL | 11 refills | Status: AC
  Filled 2024-08-06 – 2024-08-07 (×2): qty 30, 30d supply, fill #0
  Filled 2024-09-01 – 2024-09-03 (×3): qty 30, 30d supply, fill #1

## 2024-08-06 NOTE — Telephone Encounter (Signed)
 Pharmacy Patient Advocate Encounter   Received notification from RX Request Messages that prior authorization for Rinvoq  30MG  er tablets is required/requested.   Insurance verification completed.   The patient is insured through [facility].   Per test claim: PA required; PA submitted to above mentioned insurance via Latent Key/confirmation #/[id number] Status is pending

## 2024-08-06 NOTE — Telephone Encounter (Signed)
 PA request has been Submitted. New Encounter has been or will be created for follow up. For additional info see Pharmacy Prior Auth telephone encounter from 08-06-2024.

## 2024-08-07 ENCOUNTER — Other Ambulatory Visit (HOSPITAL_COMMUNITY): Payer: Self-pay

## 2024-08-07 ENCOUNTER — Other Ambulatory Visit: Payer: Self-pay

## 2024-08-07 NOTE — Telephone Encounter (Signed)
 Pharmacy Patient Advocate Encounter  Received notification from [facility] that Prior Authorization for Rinvoq  30MG  er tablets has been APPROVED from 08-06-2024 to 08-06-2025   PA #/Case ID/Reference #: [id number]

## 2024-08-07 NOTE — Progress Notes (Signed)
 Specialty Pharmacy Refill Coordination Note  MyChart Questionnaire Submission  Lauren [name] is a 63 y.o. female contacted today regarding refills of specialty medication(s) Rinvoq .  Doses on hand: (Patient-Rptd) approx 10   Patient requested: (Patient-Rptd) Pickup at [facility] date: 08/08/24  Medication will be filled on 08/07/24

## 2024-08-07 NOTE — Telephone Encounter (Signed)
 Noted

## 2024-08-11 ENCOUNTER — Other Ambulatory Visit

## 2024-08-11 ENCOUNTER — Inpatient Hospital Stay
Admission: RE | Admit: 2024-08-11 | Discharge: 2024-08-11 | Attending: Physician Assistant | Admitting: Physician Assistant

## 2024-08-11 ENCOUNTER — Other Ambulatory Visit: Payer: Self-pay | Admitting: Physician Assistant

## 2024-08-11 DIAGNOSIS — R928 Other abnormal and inconclusive findings on diagnostic imaging of breast: Secondary | ICD-10-CM

## 2024-08-13 ENCOUNTER — Ambulatory Visit: Admitting: Gastroenterology

## 2024-08-13 ENCOUNTER — Encounter: Payer: Self-pay | Admitting: Gastroenterology

## 2024-08-13 VITALS — BP 108/70 | HR 61 | Ht 59.0 in | Wt 163.0 lb

## 2024-08-13 DIAGNOSIS — Z79899 Other long term (current) drug therapy: Secondary | ICD-10-CM | POA: Diagnosis not present

## 2024-08-13 DIAGNOSIS — Z23 Encounter for immunization: Secondary | ICD-10-CM | POA: Diagnosis not present

## 2024-08-13 DIAGNOSIS — E7800 Pure hypercholesterolemia, unspecified: Secondary | ICD-10-CM | POA: Diagnosis not present

## 2024-08-13 DIAGNOSIS — K51019 Ulcerative (chronic) pancolitis with unspecified complications: Secondary | ICD-10-CM | POA: Diagnosis not present

## 2024-08-13 NOTE — Progress Notes (Signed)
 HPI :   Colitis History Initially left sided ulcerative colitis, diagnosed > 15 years ago. On Remicade  in the past for her colitis, which she thinks she did well for a few years. She had a severe MRSA infection of her ear and ultimately Remicade  was held and eventually was stopped given she had done well when it was held. She has never been on methotrexate. She has been on Lialda  for a long time, prior to that Asacol . She has Von Willebrands. She bleeds easily but has never had significant bleeding / hemorrhages in the past. She has had DDAVP  infusions prior to her last colonoscopy or Stilmate nose spray.  Started on Entyvio  April 2018 following a few flares that Spring. Failed Entyvio  2019, started Humira  and Feb / March 2020. Failed Humira  / 6 MP combination therapy 12/2018. Hepatoxicity to thiopurines - levels > 50K. Transitioned to Stelara  monotherapy 02/2019. Now PANCOLONIC colitis. Moderately active disease on every 4 weeks dosing of Stelara  so it was also stopped summer 2022. Noted to have pancolitis at that time. Started Rinvoq  [name] 2023.     SINCE LAST VISIT:   63 year old female here for a follow-up visit for ulcerative colitis.  She continues to take Rinvoq  30 mg daily.  Recall she had breakthrough symptoms with an elevated fecal calprotectin while she was on 15 mg dosing.  Fortunately she was recaptured with higher dosing and has not had any flares since making that change.    She had a colonoscopy with me this past August.  There was no active inflammation in her colon and biopsies showed no active colitis or dysplasia.  Very happy with that result.  She is having regular bowels that are formed, no blood in the stools.  No flares or abdominal pains.  She has had some elevated LDL, this past July was elevated to 167.  She is also been found to have osteoporosis with a T-score of -3.1.  She is not interested in pursuing medical therapy for her osteoporosis and has started doing  resistance training.  She is due for her flu shot, she is not interested in pursuing the COVID-vaccine.  Her other vaccines are otherwise up-to-date.  She had an abnormal mammogram that led to a ultrasound of her breast which appears to show benign tissue however she is having close surveillance follow-up of that area to make sure.  Recall her colonoscopy exams have been done in the hospital due to her bleeding risks in light of her coagulation disorder.  She is followed by hematology and usually has DDAVP  administered in the preop area at the hospital prior to exams.  However, last time they did not recommend DDAVP  infusion and only tranexamic acid  pre and post procedure and she did quite well without any bleeding.      IBD Health Care Maintenance: Annual Flu Vaccine - Date due: 2025 DUE Pneumococcal Vaccine if receiving immunosuppression: - Date PCV 13 - 05/21/2018, PPSV 23 05/01/19 Zoster vaccine 08/15/19, 05/17/19 COVID VACCINE deferred 2024/2025 TB testing if on anti-TNF, yearly - negative 2024 Vitamin D  screening - Date 11/08/20 - level of 49.18 Last Colonoscopy :   Colonoscopy 03/22/21 - The examined portion of the ileum was normal. - Moderately active (Mayo Score 2) ulcerative colitis in the rectum / rectosigmoid colon. Biopsied. - Mild (Mayo Score 1) ulcerative colitis thoughout elsewhere. Biopsied. - One 6 mm polyp in the distal transverse colon, removed with a hot snare. Resected and retrieved. Clips were placed. -  One 2 to 3 mm polyp at the recto-sigmoid colon, removed with a cold biopsy forceps. Resected and retrieved. - The examination was otherwise normal. Unfortunately active inflammation despite Stelara  q 4 weeks dosing. Will discuss options with the patient.     FINAL MICROSCOPIC DIAGNOSIS:   A. COLON, RIGHT, BIOPSY:  - Chronic moderately active colitis.  - No dysplasia or malignancy.   B. COLON, TRANSVERSE, BIOPSY:  - Chronic moderately active colitis.  - No  dysplasia or malignancy.   C. COLON, TRANSVERSE, POLYPECTOMY:  - Inflammatory pseudopolyp.  - No dysplasia or malignancy.   D. COLON, LEFT, BIOPSY:  - Chronic mildly active colitis.  - No dysplasia or malignancy.   E. COLON, RECTOSIGMOID, RECTUM, BIOPSY:  - Chronic moderately active colitis.  - No dysplasia or malignancy.   F. RECTUM, POLYPECTOMY:  - Inflammatory pseudopolyp.  - No dysplasia or malignancy.      Colonoscopy 01/15/20 - The examined portion of the ileum was normal. - One 3 to 4 mm polyp in the sigmoid colon, removed with a cold snare. Resected and retrieved. - One 3 mm polyp at the recto-sigmoid colon, removed with a cold biopsy forceps. Resected and retrieved. - Mild (Mayo Score 1) ulcerative colitis of the distal 20cm, otherwise no other inflammatory changes noted. Overall, significantly improved since the last examination. Biopsied. - Biopsies for surveillance were taken from the right colon, left colon, transverse colon, and Rectum.    Colonoscopy 01/24/2019 - severe left sided inflammation, mild in transverse and right colon, normal ileum. Benign inflammatory polyp of the cecum   Colonoscopy 2014 - active rectal inflammation, inflammatory polyp Colonoscopy 09/21/2015 - overall good control of colitis with small mild area of inflammation in left colon, mild chronic colitis Colonoscopy 10/30/2017 - normal ileum, 10-15cm segment of mild inflammation, otherwise normal - mildly active colitis      Colonoscopy 05/04/2022: - The examined portion of the ileum was normal. - Internal hemorrhoids. - The examination was otherwise normal. - Biopsies were taken with a cold forceps for histology in the rectum, in the sigmoid colon, in the descending colon, in the transverse colon and in the ascending colon. Interval healing of colonic mucosa on Rinvoq , endoscopically appears to be in remission.   FINAL MICROSCOPIC DIAGNOSIS:   A. RIGHT COLON, BIOPSY:  - Chronic inactive colitis   - Negative for granulomas, viral cytopathic effect, dysplasia or  malignancy   B. TRANSVERSE COLON, BIOPSY:  - Chronic inactive colitis  - Negative for granulomas, viral cytopathic effect, dysplasia or  malignancy   C. LEFT COLON, BIOPSY:  - Chronic inactive colitis with Paneth cell metaplasia  - Negative for granulomas, viral cytopathic effect, dysplasia or  malignancy   D. RECTAL COLON, BIOPSY:  - Chronic inactive colitis with Paneth cell metaplasia  - Negative for granulomas, viral cytopathic effect, dysplasia or  malignancy        Fecal calprotectin 12/14/22 - 668   Fecal calprotectin 04/13/23 - 35   Fecal calprotectin 07/2023 - 7    Colonoscopy 04/10/2024: - The examined portion of the ileum was normal. - One 3 mm polyp in the ascending colon, removed with a cold biopsy forceps. Resected and retrieved. - Internal hemorrhoids. - The examination was otherwise normal. - Biopsies were taken with a cold forceps for histology. Overall, remains well controlled on Rinvoq , no appreciable active inflammation.  FINAL MICROSCOPIC DIAGNOSIS:  A. COLON, ASCENDING, POLYPECTOMY:      Colonic mucosa with lymphoid aggregates.  Negative for dysplasia.  B. COLON, RIGHT, BIOPSY:      Colonic mucosa with no significant diagnostic alteration.      No evidence of lymphocytic colitis or collagenous colitis.              Negative for activity, chronicity, granuloma, dysplasia or malignancy.  C. COLON, TRANSVERSE, BIOPSY:      Colonic mucosa with no significant diagnostic alteration.      No evidence of lymphocytic colitis or collagenous colitis.              Negative for activity, chronicity, granuloma, dysplasia or malignancy.  D. COLON, LEFT, BIOPSY:      Colonic mucosa with crypt architectural distortion and Paneth cell metaplasia, indicative of repaired injury.      No evidence of lymphocytic colitis or collagenous colitis. Negative for activity, granuloma, dysplasia or  malignancy.       See comment.  COMMENT  D.The biopsy is composed of fragments of colonic mucosa with focal mild crypt architectural distortion and Paneth cell metaplasia (if the sampling is from the colon distal to splenic flexure). There is no evidence of activity. The overall findings are indicative of repaired injury. The etiology considerations include infection, medication effect, diverticular disease associated colitis, ischemic colitis, et al. Clinical correlation is recommended.   Repeat colon in 2 years or so   Past Medical History:  Diagnosis Date   Allergy    Anemia    Anxiety    Bradycardia    Cataract    Cellulitis of left breast 09/17/2015   diagnosed at Urgent Care- on ABX   Chronic kidney disease    tumors of L kidney   Clostridium difficile diarrhea    Clotting disorder    Depression    takes Prozac  daily   Hearing loss    right ear   History of benign brain tumor    meninoma   History of colon polyps    benign   Hyperlipidemia    Hypertension    takes Metoprolol  daily   Hypokalemia    takes Potassium every other day   Inflammatory polyps of colon (HCC)    Internal hemorrhoids    Mucosal abnormality of stomach    Osteopenia    Osteoporosis    Personal history of meningioma of the brain    Platelet disorder (HCC)    Pneumonia    hx of-about 4+ yrs ago   Seasonal allergies    takes Allegra as needed and Flonase daily   SVT (supraventricular tachycardia)    Tennis elbow    Ulcerative colitis    On Entyvio  q 4 week dosing   Urinary urgency    Vitamin B12 deficiency    Von Willebrands disease (HCC)      Past Surgical History:  Procedure Laterality Date   BAHA REVISION Right 03/15/2016   Procedure: REVISION RIGHT BAHA HEARING IMPLANT ;  Surgeon: Camellia Milliner, MD;  Location: [facility] OR;  Service: ENT;  Laterality: Right;   BIOPSY  01/24/2019   Procedure: BIOPSY;  Surgeon: Leigh Elspeth SQUIBB, MD;  Location: [facility];  Service:  Gastroenterology;;   BIOPSY  01/15/2020   Procedure: BIOPSY;  Surgeon: Leigh Elspeth SQUIBB, MD;  Location: [facility];  Service: Gastroenterology;;   BIOPSY  03/22/2021   Procedure: BIOPSY;  Surgeon: Leigh Elspeth SQUIBB, MD;  Location: [facility];  Service: Gastroenterology;;   BIOPSY  05/04/2022   Procedure: BIOPSY;  Surgeon: Leigh Elspeth SQUIBB, MD;  Location: [facility];  Service: Gastroenterology;;   BRAIN MENINGIOMA EXCISION  05/2007   CARDIAC ELECTROPHYSIOLOGY STUDY AND ABLATION  10/22/09   COLONOSCOPY N/A 07/29/2013   Procedure: COLONOSCOPY;  Surgeon: Princella CHRISTELLA Nida, MD;  Location: [facility];  Service: Endoscopy;  Laterality: N/A;   COLONOSCOPY N/A 04/10/2024   Procedure: COLONOSCOPY;  Surgeon: Leigh Elspeth SQUIBB, MD;  Location: [facility];  Service: Gastroenterology;  Laterality: N/A;   COLONOSCOPY WITH PROPOFOL  N/A 09/21/2015   Procedure: COLONOSCOPY WITH PROPOFOL ;  Surgeon: Elspeth Deward Leigh, MD;  Location: [facility];  Service: Gastroenterology;  Laterality: N/A;   COLONOSCOPY WITH PROPOFOL  N/A 10/30/2017   Procedure: COLONOSCOPY WITH PROPOFOL ;  Surgeon: Leigh Elspeth SQUIBB, MD;  Location: [facility];  Service: Gastroenterology;  Laterality: N/A;   COLONOSCOPY WITH PROPOFOL  N/A 01/24/2019   Procedure: COLONOSCOPY WITH PROPOFOL ;  Surgeon: Leigh Elspeth SQUIBB, MD;  Location: [facility];  Service: Gastroenterology;  Laterality: N/A;   COLONOSCOPY WITH PROPOFOL  N/A 01/15/2020   Procedure: COLONOSCOPY WITH PROPOFOL ;  Surgeon: Leigh Elspeth SQUIBB, MD;  Location: [facility];  Service: Gastroenterology;  Laterality: N/A;  needs DDAVP  30-60 minutes prior to procedure.   COLONOSCOPY WITH PROPOFOL  N/A 03/22/2021   Procedure: COLONOSCOPY WITH PROPOFOL ;  Surgeon: Leigh Elspeth SQUIBB, MD;  Location: [facility];  Service: Gastroenterology;  Laterality: N/A;   COLONOSCOPY WITH PROPOFOL  N/A 05/04/2022   Procedure: COLONOSCOPY WITH PROPOFOL ;  Surgeon: Leigh Elspeth SQUIBB, MD;   Location: [facility];  Service: Gastroenterology;  Laterality: N/A;   ESOPHAGOGASTRODUODENOSCOPY     HEMOSTASIS CLIP PLACEMENT  03/22/2021   Procedure: HEMOSTASIS CLIP PLACEMENT;  Surgeon: Leigh Elspeth SQUIBB, MD;  Location: [facility];  Service: Gastroenterology;;   IMPLANTATION BONE ANCHORED HEARING AID Right    POLYPECTOMY  01/24/2019   Procedure: POLYPECTOMY;  Surgeon: Leigh Elspeth SQUIBB, MD;  Location: [facility];  Service: Gastroenterology;;   POLYPECTOMY  01/15/2020   Procedure: POLYPECTOMY;  Surgeon: Leigh Elspeth SQUIBB, MD;  Location: [facility];  Service: Gastroenterology;;   POLYPECTOMY  03/22/2021   Procedure: POLYPECTOMY;  Surgeon: Leigh Elspeth SQUIBB, MD;  Location: [facility];  Service: Gastroenterology;;   WISDOM TOOTH EXTRACTION     Family History  Problem Relation Age of Onset   Heart disease Mother    Stroke Mother        multiple, first in 5s   Hypertension Mother    Birth marks Mother        large pinkish birth mark on upper arm   Seizures Mother    Club foot Sister        bilateral   Polycystic ovary syndrome Sister    Speech disorder Brother        hearing concerns as well   Bipolar disorder Maternal Grandmother    Heart disease Maternal Grandfather        cardiac arrest (heat stroke)   Breast cancer Paternal Grandmother    Brain cancer Paternal Grandfather        metastatic   Polycystic ovary syndrome Daughter    Panic disorder Daughter    Autism Nephew    Pneumonia Maternal Uncle    Suicidality Maternal Uncle    Dementia Maternal Uncle    Colon cancer Neg Hx    Esophageal cancer Neg Hx    Rectal cancer Neg Hx    Stomach cancer Neg Hx    Colon polyps Neg Hx    Social History[1] Current Outpatient Medications  Medication Sig Dispense Refill   CALCIUM  PO Take 600 mg by mouth.  Cholecalciferol (VITAMIN D3) 125 MCG (5000 UT) CAPS Take 5,000 Units by mouth daily.     clotrimazole-betamethasone (LOTRISONE) cream Apply 1 application  topically daily as needed (Eczema or inflamed in skull).     FLUoxetine  (PROZAC ) 20 MG capsule Take 1 capsule (20 mg total) by mouth daily. 90 capsule 3   hydrocortisone  2.5 % ointment Apply 1 application topically 2 (two) times daily as needed (for eczema).     metoprolol  succinate (TOPROL  XL) 25 MG 24 hr tablet Take 1 tablet (25 mg total) by mouth daily. 90 tablet 3   mometasone  (ELOCON ) 0.1 % cream Apply a small amount to affected area twice a day (Patient taking differently: Apply topically as needed.) 15 g 0   NON FORMULARY Take 1 tablet by mouth daily. Terrazyme (Essential Oil) Digestive enzyme     OVER THE COUNTER MEDICATION Take 1 capsule by mouth daily. Doterra  Micro plex Vmz     OVER THE COUNTER MEDICATION Take 1 tablet by mouth daily. Doterra Dr-prime culture complex     tranexamic acid  (LYSTEDA ) 650 MG TABS tablet Take 1,300 mg by mouth 3 (three) times daily as needed (FOR INTERNAL PROCEDURE TO STOP BLEEDING). For internal procedure to stop bleeding     Upadacitinib  ER (RINVOQ ) 30 MG TB24 Take 1 tablet (30 mg total) by mouth daily. 30 tablet 11   Current Facility-Administered Medications  Medication Dose Route Frequency Provider Last Rate Last Admin   desmopressin  (DDAVP ) 23.6 mcg in sodium chloride  0.9 % IV Syringe  0.3 mcg/kg Intravenous Once Lynda Capistran, Elspeth SQUIBB, MD       Allergies[2]   Review of Systems: All systems reviewed and negative except where noted in HPI.   Lab Results  Component Value Date   WBC 4.0 08/17/2023   HGB 13.1 08/17/2023   HCT 39.0 08/17/2023   MCV 92.9 08/17/2023   PLT 237.0 08/17/2023   Lab Results  Component Value Date   NA 138 08/17/2023   CL 102 08/17/2023   K 4.6 08/17/2023   CO2 31 08/17/2023   BUN 20 08/17/2023   CREATININE 1.02 08/17/2023   GFR 58.98 (L) 08/17/2023   CALCIUM  9.5 08/17/2023   PHOS 3.7 01/09/2012   ALBUMIN 4.4 08/17/2023   GLUCOSE 115 (H) 08/17/2023    Lab Results  Component Value Date   ALT 13 08/17/2023    AST 22 08/17/2023   ALKPHOS 51 08/17/2023   BILITOT 0.5 08/17/2023     Physical Exam: BP 108/70   Pulse 61   Ht 4' 11 (1.499 m)   Wt 163 lb (73.9 kg)   LMP 11/26/2008   BMI 32.92 kg/m  Constitutional: Pleasant,well-developed, female in no acute distress. Neurological: Alert and oriented to person place and time. Psychiatric: Normal mood and affect. Behavior is normal.   ASSESSMENT: 63 y.o. female here for assessment of the following  1. Ulcerative colitis, universal, unspecified complication (HCC)   2. High risk medication use   3. Elevated LDL cholesterol level    She has failed numerous regimens in the past, fortunately she has responded quite well to higher dosing of Rinvoq .  Colonoscopy show she is in deep remission this past summer, she clinically is in remission at this time.  She is due for basic labs in addition to QuantiFERON gold.  I will also check a lipid panel to follow-up her elevated LDL.  She may want to consider a cardiac CT but defer that to her primary care team.  We discussed  risks benefits of Rinvoq .  She understands the risks and wants to continue with it.  She is due for flu shot, will give her that today.  She currently declines the COVID-vaccine.  Osteoporosis noted, she declines treatment for this for now, working on resistance training and supplementing vitamin D  and calcium .  I will see her in 6 months if not sooner with any issues.  Consider colonoscopy in the next 2 years.   PLAN: - continue Rinvoq  30mg  / day - labs - lipid panel, CBC, CMET, quantiferon gold - flu shot today  - consider cardiac CT pending lipid panel, defer to PCP - osteoporosis noted - she declines treatment for this for now, working on resistance training, vitamin D  / calcium  - f/u 6 months  Marcey Naval, MD [facility]     [1]  Social History Tobacco Use   Smoking status: Never    Passive exposure: Never   Smokeless tobacco: Never  Vaping Use    Vaping status: Never Used  Substance Use Topics   Alcohol use: No    Alcohol/week: 0.0 standard drinks of alcohol   Drug use: No  [2]  Allergies Allergen Reactions   Atenolol Swelling   Nsaids Other (See Comments)    PLATELET DISORDER  NSAIDS contraindicated with bleeding disorder.  NSAIDS contraindicated with bleeding disorder.   Sulfamethoxazole  Other (See Comments) and Rash    redness   Asa [Aspirin] Other (See Comments)    Platelet disorder    Cephalexin Other (See Comments)    Upset her colitis    Corn Starch Other (See Comments)    Gi-upset  Allergy test   Ibuprofen Other (See Comments)    Platelet disrder   Mercaptopurine  Hives and Itching    Elevated liver levels   Soy Allergy (Obsolete) Other (See Comments)    GI symptoms

## 2024-08-13 NOTE — Patient Instructions (Addendum)
 Please go to the lab in the basement of our building to have lab work done when you have been fasting. Hit B for basement when you get on the elevator.  When the doors open the lab is on your left.  We will call you with the results. Thank you.   We are giving you a Flu shot today.  Continue Rinvoq .  Thank you for entrusting me with your care and for choosing [facility], Dr. Elspeth Naval    _______________________________________________________  If your blood pressure at your visit was 140/90 or greater, please contact your primary care physician to follow up on this.  _______________________________________________________  If you are age 26 or older, your body mass index should be between 23-30. Your Body mass index is 32.92 kg/m. If this is out of the aforementioned range listed, please consider follow up with your Primary Care Provider.  If you are age 21 or younger, your body mass index should be between 19-25. Your Body mass index is 32.92 kg/m. If this is out of the aformentioned range listed, please consider follow up with your Primary Care Provider.   ________________________________________________________  The [facility] providers would like to encourage you to use MYCHART to communicate with providers for non-urgent requests or questions.  Due to long hold times on the telephone, sending your provider a message by [facility] may be a faster and more efficient way to get a response.  Please allow 48 business hours for a response.  Please remember that this is for non-urgent requests.  _______________________________________________________  [facility] is using a team-based approach to care.  Your team is made up of your doctor and two to three APPS. Our APPS (Nurse Practitioners and Physician Assistants) work with your physician to ensure care continuity for you. They are fully qualified to address your health concerns and develop a treatment plan.  They communicate directly with your gastroenterologist to care for you. Seeing the Advanced Practice Practitioners on your physician's team can help you by facilitating care more promptly, often allowing for earlier appointments, access to diagnostic testing, procedures, and other specialty referrals.

## 2024-08-14 ENCOUNTER — Other Ambulatory Visit (INDEPENDENT_AMBULATORY_CARE_PROVIDER_SITE_OTHER)

## 2024-08-14 DIAGNOSIS — E7800 Pure hypercholesterolemia, unspecified: Secondary | ICD-10-CM | POA: Diagnosis not present

## 2024-08-14 DIAGNOSIS — Z79899 Other long term (current) drug therapy: Secondary | ICD-10-CM | POA: Diagnosis not present

## 2024-08-14 DIAGNOSIS — K51019 Ulcerative (chronic) pancolitis with unspecified complications: Secondary | ICD-10-CM | POA: Diagnosis not present

## 2024-08-14 LAB — COMPREHENSIVE METABOLIC PANEL WITH GFR
ALT: 11 U/L (ref 3–35)
AST: 22 U/L (ref 5–37)
Albumin: 4.4 g/dL (ref 3.5–5.2)
Alkaline Phosphatase: 39 U/L (ref 39–117)
BUN: 15 mg/dL (ref 6–23)
CO2: 27 meq/L (ref 19–32)
Calcium: 9.3 mg/dL (ref 8.4–10.5)
Chloride: 105 meq/L (ref 96–112)
Creatinine, Ser: 0.82 mg/dL (ref 0.40–1.20)
GFR: 76.1 mL/min (ref >60.00)
Glucose, Bld: 93 mg/dL (ref 70–99)
Potassium: 4.2 meq/L (ref 3.5–5.1)
Sodium: 142 meq/L (ref 135–145)
Total Bilirubin: 0.6 mg/dL (ref 0.2–1.2)
Total Protein: 7.2 g/dL (ref 6.0–8.3)

## 2024-08-14 LAB — CBC WITH DIFFERENTIAL/PLATELET
Basophils Absolute: 0 K/uL (ref 0.0–0.1)
Basophils Relative: 0.4 % (ref 0.0–3.0)
Eosinophils Absolute: 0.1 K/uL (ref 0.0–0.7)
Eosinophils Relative: 1.4 % (ref 0.0–5.0)
HCT: 36.3 % (ref 36.0–46.0)
Hemoglobin: 12.4 g/dL (ref 12.0–15.0)
Lymphocytes Relative: 16.8 % (ref 12.0–46.0)
Lymphs Abs: 0.6 K/uL — ABNORMAL LOW (ref 0.7–4.0)
MCHC: 34.3 g/dL (ref 30.0–36.0)
MCV: 90.4 fl (ref 78.0–100.0)
Monocytes Absolute: 0.4 K/uL (ref 0.1–1.0)
Monocytes Relative: 9.6 % (ref 3.0–12.0)
Neutro Abs: 2.7 K/uL (ref 1.4–7.7)
Neutrophils Relative %: 71.8 % (ref 43.0–77.0)
Platelets: 215 K/uL (ref 150.0–400.0)
RBC: 4.01 Mil/uL (ref 3.87–5.11)
RDW: 15.1 % (ref 11.5–15.5)
WBC: 3.8 K/uL — ABNORMAL LOW (ref 4.0–10.5)

## 2024-08-18 LAB — QUANTIFERON-TB GOLD PLUS
Mitogen-NIL: 3.07 [IU]/mL
NIL: 0.01 [IU]/mL
QuantiFERON-TB Gold Plus: NEGATIVE
TB1-NIL: 0 [IU]/mL
TB2-NIL: 0 [IU]/mL

## 2024-08-18 LAB — LIPID PANEL
Cholesterol: 274 mg/dL — ABNORMAL HIGH
HDL: 72 mg/dL
LDL Cholesterol (Calc): 181 mg/dL — ABNORMAL HIGH
Non-HDL Cholesterol (Calc): 202 mg/dL — ABNORMAL HIGH
Total CHOL/HDL Ratio: 3.8 (calc)
Triglycerides: 91 mg/dL

## 2024-09-01 ENCOUNTER — Other Ambulatory Visit: Payer: Self-pay

## 2024-09-03 ENCOUNTER — Other Ambulatory Visit: Payer: Self-pay

## 2024-09-04 ENCOUNTER — Other Ambulatory Visit (HOSPITAL_COMMUNITY): Payer: Self-pay

## 2024-09-04 ENCOUNTER — Other Ambulatory Visit: Payer: Self-pay

## 2024-09-04 NOTE — Progress Notes (Signed)
 Specialty Pharmacy Refill Coordination Note  Lauren [name] is a 64 y.o. female contacted today regarding refills of specialty medication(s) Upadacitinib  (Rinvoq )   Patient requested [name] at [facility] at [place] date: 09/09/24   Medication will be filled on: 09/05/24

## 2024-09-05 ENCOUNTER — Other Ambulatory Visit: Payer: Self-pay

## 2024-09-08 ENCOUNTER — Other Ambulatory Visit (HOSPITAL_COMMUNITY): Payer: Self-pay

## 2024-09-09 ENCOUNTER — Other Ambulatory Visit: Payer: Self-pay

## 2024-09-09 NOTE — Progress Notes (Signed)
 per patient abbvie should load additional funding 1.15 to cover the remaining copay, reprocess then. patient advised to follow-up with spec on 1.15 before going to [facility] to pickup in case there are any issues.

## 2024-09-13 ENCOUNTER — Other Ambulatory Visit (HOSPITAL_COMMUNITY): Payer: Self-pay

## 2024-09-15 ENCOUNTER — Other Ambulatory Visit: Payer: Self-pay

## 2024-09-15 ENCOUNTER — Other Ambulatory Visit (HOSPITAL_COMMUNITY): Payer: Self-pay

## 2024-09-16 ENCOUNTER — Other Ambulatory Visit: Payer: Self-pay

## 2024-09-16 ENCOUNTER — Other Ambulatory Visit (HOSPITAL_COMMUNITY): Payer: Self-pay

## 2024-09-16 NOTE — Progress Notes (Signed)
 Received call from copay support and per rep funds to cover remaining $5,069.74 copay should be loaded onto virtual manufacture debit card. Pharmacy does not have this information. Reached out to patient and she has not received manufacture debit info. Patient will call abbvie and follow-up with pharmacy.

## 2024-09-17 ENCOUNTER — Other Ambulatory Visit: Payer: Self-pay

## 2024-09-23 ENCOUNTER — Other Ambulatory Visit (HOSPITAL_COMMUNITY): Payer: Self-pay

## 2024-09-29 ENCOUNTER — Other Ambulatory Visit (HOSPITAL_COMMUNITY): Payer: Self-pay

## 2024-11-12 ENCOUNTER — Other Ambulatory Visit

## 2025-05-14 ENCOUNTER — Other Ambulatory Visit (HOSPITAL_COMMUNITY)

## 2025-05-20 ENCOUNTER — Ambulatory Visit: Admitting: Urology
